# Patient Record
Sex: Female | Born: 1935 | Race: White | Hispanic: No | Smoking: Never smoker
Health system: Southern US, Community
[De-identification: ages and names within clinical notes are randomized; demographics above are authoritative.]

## PROBLEM LIST (undated history)

## (undated) DIAGNOSIS — L8992 Pressure ulcer of unspecified site, stage 2: Secondary | ICD-10-CM

## (undated) DIAGNOSIS — F0390 Unspecified dementia without behavioral disturbance: Secondary | ICD-10-CM

## (undated) DIAGNOSIS — F419 Anxiety disorder, unspecified: Secondary | ICD-10-CM

## (undated) DIAGNOSIS — D649 Anemia, unspecified: Secondary | ICD-10-CM

## (undated) DIAGNOSIS — E785 Hyperlipidemia, unspecified: Secondary | ICD-10-CM

## (undated) DIAGNOSIS — I10 Essential (primary) hypertension: Secondary | ICD-10-CM

## (undated) DIAGNOSIS — I509 Heart failure, unspecified: Secondary | ICD-10-CM

## (undated) HISTORY — DX: Anxiety disorder, unspecified: F41.9

## (undated) HISTORY — DX: Hyperlipidemia, unspecified: E78.5

## (undated) HISTORY — PX: BILATERAL OOPHORECTOMY: SHX1221

## (undated) HISTORY — PX: ABDOMINAL HYSTERECTOMY: SHX81

## (undated) HISTORY — PX: CHOLECYSTECTOMY: SHX55

---

## 1998-08-31 ENCOUNTER — Encounter: Payer: Self-pay | Admitting: Internal Medicine

## 1998-08-31 ENCOUNTER — Ambulatory Visit (HOSPITAL_COMMUNITY): Admission: RE | Admit: 1998-08-31 | Discharge: 1998-08-31 | Payer: Self-pay | Admitting: Internal Medicine

## 1999-07-21 ENCOUNTER — Ambulatory Visit (HOSPITAL_COMMUNITY): Admission: RE | Admit: 1999-07-21 | Discharge: 1999-07-21 | Payer: Self-pay | Admitting: Gastroenterology

## 1999-07-21 ENCOUNTER — Encounter (INDEPENDENT_AMBULATORY_CARE_PROVIDER_SITE_OTHER): Payer: Self-pay | Admitting: *Deleted

## 1999-07-22 ENCOUNTER — Encounter (INDEPENDENT_AMBULATORY_CARE_PROVIDER_SITE_OTHER): Payer: Self-pay | Admitting: *Deleted

## 1999-07-23 ENCOUNTER — Observation Stay (HOSPITAL_COMMUNITY): Admission: EM | Admit: 1999-07-23 | Discharge: 1999-07-24 | Payer: Self-pay | Admitting: Emergency Medicine

## 1999-07-23 ENCOUNTER — Encounter: Payer: Self-pay | Admitting: Emergency Medicine

## 1999-07-25 ENCOUNTER — Encounter: Payer: Self-pay | Admitting: Surgery

## 1999-07-25 ENCOUNTER — Inpatient Hospital Stay (HOSPITAL_COMMUNITY): Admission: EM | Admit: 1999-07-25 | Discharge: 1999-07-26 | Payer: Self-pay | Admitting: Emergency Medicine

## 1999-09-02 ENCOUNTER — Encounter: Payer: Self-pay | Admitting: Internal Medicine

## 1999-09-02 ENCOUNTER — Ambulatory Visit (HOSPITAL_COMMUNITY): Admission: RE | Admit: 1999-09-02 | Discharge: 1999-09-02 | Payer: Self-pay | Admitting: Internal Medicine

## 2000-01-03 ENCOUNTER — Other Ambulatory Visit: Admission: RE | Admit: 2000-01-03 | Discharge: 2000-01-03 | Payer: Self-pay | Admitting: *Deleted

## 2000-01-12 ENCOUNTER — Ambulatory Visit (HOSPITAL_COMMUNITY): Admission: RE | Admit: 2000-01-12 | Discharge: 2000-01-12 | Payer: Self-pay | Admitting: Gastroenterology

## 2000-02-03 ENCOUNTER — Encounter: Admission: RE | Admit: 2000-02-03 | Discharge: 2000-02-03 | Payer: Self-pay | Admitting: Internal Medicine

## 2000-02-03 ENCOUNTER — Encounter: Payer: Self-pay | Admitting: Internal Medicine

## 2000-08-23 ENCOUNTER — Encounter: Payer: Self-pay | Admitting: Urology

## 2000-08-23 ENCOUNTER — Encounter: Admission: RE | Admit: 2000-08-23 | Discharge: 2000-08-23 | Payer: Self-pay | Admitting: Urology

## 2000-08-24 ENCOUNTER — Other Ambulatory Visit: Admission: RE | Admit: 2000-08-24 | Discharge: 2000-08-24 | Payer: Self-pay | Admitting: Urology

## 2000-09-14 ENCOUNTER — Encounter: Admission: RE | Admit: 2000-09-14 | Discharge: 2000-09-14 | Payer: Self-pay | Admitting: Internal Medicine

## 2000-09-14 ENCOUNTER — Encounter: Payer: Self-pay | Admitting: Internal Medicine

## 2000-09-14 ENCOUNTER — Ambulatory Visit (HOSPITAL_COMMUNITY): Admission: RE | Admit: 2000-09-14 | Discharge: 2000-09-14 | Payer: Self-pay | Admitting: Internal Medicine

## 2001-07-10 ENCOUNTER — Encounter: Payer: Self-pay | Admitting: Family Medicine

## 2001-07-10 ENCOUNTER — Encounter: Admission: RE | Admit: 2001-07-10 | Discharge: 2001-07-10 | Payer: Self-pay | Admitting: Family Medicine

## 2001-08-02 ENCOUNTER — Encounter: Admission: RE | Admit: 2001-08-02 | Discharge: 2001-08-02 | Payer: Self-pay | Admitting: Family Medicine

## 2001-08-02 ENCOUNTER — Ambulatory Visit (HOSPITAL_COMMUNITY): Admission: RE | Admit: 2001-08-02 | Discharge: 2001-08-02 | Payer: Self-pay | Admitting: Family Medicine

## 2001-08-02 ENCOUNTER — Encounter: Payer: Self-pay | Admitting: Family Medicine

## 2001-09-28 ENCOUNTER — Encounter: Payer: Self-pay | Admitting: Family Medicine

## 2001-09-28 ENCOUNTER — Ambulatory Visit (HOSPITAL_COMMUNITY): Admission: RE | Admit: 2001-09-28 | Discharge: 2001-09-28 | Payer: Self-pay | Admitting: Family Medicine

## 2002-09-30 ENCOUNTER — Ambulatory Visit (HOSPITAL_COMMUNITY): Admission: RE | Admit: 2002-09-30 | Discharge: 2002-09-30 | Payer: Self-pay | Admitting: Family Medicine

## 2002-09-30 ENCOUNTER — Encounter: Payer: Self-pay | Admitting: Family Medicine

## 2002-09-30 ENCOUNTER — Encounter: Admission: RE | Admit: 2002-09-30 | Discharge: 2002-09-30 | Payer: Self-pay | Admitting: Family Medicine

## 2003-03-03 ENCOUNTER — Encounter: Admission: RE | Admit: 2003-03-03 | Discharge: 2003-03-03 | Payer: Self-pay | Admitting: Family Medicine

## 2003-03-23 ENCOUNTER — Encounter: Admission: RE | Admit: 2003-03-23 | Discharge: 2003-03-23 | Payer: Self-pay | Admitting: Family Medicine

## 2003-10-01 ENCOUNTER — Ambulatory Visit (HOSPITAL_COMMUNITY): Admission: RE | Admit: 2003-10-01 | Discharge: 2003-10-01 | Payer: Self-pay | Admitting: Family Medicine

## 2004-10-05 ENCOUNTER — Ambulatory Visit (HOSPITAL_COMMUNITY): Admission: RE | Admit: 2004-10-05 | Discharge: 2004-10-05 | Payer: Self-pay | Admitting: Family Medicine

## 2005-01-12 ENCOUNTER — Ambulatory Visit (HOSPITAL_COMMUNITY): Admission: RE | Admit: 2005-01-12 | Discharge: 2005-01-12 | Payer: Self-pay | Admitting: Gastroenterology

## 2005-01-12 ENCOUNTER — Encounter (INDEPENDENT_AMBULATORY_CARE_PROVIDER_SITE_OTHER): Payer: Self-pay | Admitting: *Deleted

## 2005-10-10 ENCOUNTER — Ambulatory Visit (HOSPITAL_COMMUNITY): Admission: RE | Admit: 2005-10-10 | Discharge: 2005-10-10 | Payer: Self-pay | Admitting: Family Medicine

## 2006-05-22 ENCOUNTER — Encounter: Admission: RE | Admit: 2006-05-22 | Discharge: 2006-05-22 | Payer: Self-pay | Admitting: Gastroenterology

## 2006-06-07 ENCOUNTER — Encounter: Admission: RE | Admit: 2006-06-07 | Discharge: 2006-06-07 | Payer: Self-pay | Admitting: Family Medicine

## 2006-10-13 ENCOUNTER — Ambulatory Visit (HOSPITAL_COMMUNITY): Admission: RE | Admit: 2006-10-13 | Discharge: 2006-10-13 | Payer: Self-pay | Admitting: Family Medicine

## 2007-02-02 ENCOUNTER — Ambulatory Visit (HOSPITAL_COMMUNITY): Admission: RE | Admit: 2007-02-02 | Discharge: 2007-02-02 | Payer: Self-pay | Admitting: Family Medicine

## 2007-02-02 ENCOUNTER — Ambulatory Visit: Payer: Self-pay | Admitting: Vascular Surgery

## 2007-02-02 ENCOUNTER — Encounter (INDEPENDENT_AMBULATORY_CARE_PROVIDER_SITE_OTHER): Payer: Self-pay | Admitting: Family Medicine

## 2007-06-09 ENCOUNTER — Encounter: Admission: RE | Admit: 2007-06-09 | Discharge: 2007-06-09 | Payer: Self-pay | Admitting: Family Medicine

## 2007-10-30 ENCOUNTER — Encounter: Admission: RE | Admit: 2007-10-30 | Discharge: 2007-10-30 | Payer: Self-pay | Admitting: Family Medicine

## 2007-10-30 ENCOUNTER — Ambulatory Visit (HOSPITAL_COMMUNITY): Admission: RE | Admit: 2007-10-30 | Discharge: 2007-10-30 | Payer: Self-pay | Admitting: Family Medicine

## 2008-03-31 ENCOUNTER — Ambulatory Visit: Payer: Self-pay | Admitting: Sports Medicine

## 2008-11-07 ENCOUNTER — Encounter: Admission: RE | Admit: 2008-11-07 | Discharge: 2008-11-07 | Payer: Self-pay | Admitting: Internal Medicine

## 2008-11-27 ENCOUNTER — Ambulatory Visit (HOSPITAL_COMMUNITY): Admission: RE | Admit: 2008-11-27 | Discharge: 2008-11-27 | Payer: Self-pay | Admitting: Internal Medicine

## 2008-12-23 ENCOUNTER — Encounter: Admission: RE | Admit: 2008-12-23 | Discharge: 2008-12-23 | Payer: Self-pay | Admitting: Internal Medicine

## 2009-07-10 ENCOUNTER — Encounter: Admission: RE | Admit: 2009-07-10 | Discharge: 2009-07-10 | Payer: Self-pay | Admitting: Internal Medicine

## 2009-07-29 ENCOUNTER — Encounter: Admission: RE | Admit: 2009-07-29 | Discharge: 2009-07-29 | Payer: Self-pay | Admitting: Internal Medicine

## 2009-08-07 ENCOUNTER — Encounter: Admission: RE | Admit: 2009-08-07 | Discharge: 2009-08-07 | Payer: Self-pay | Admitting: Internal Medicine

## 2009-10-29 ENCOUNTER — Encounter: Admission: RE | Admit: 2009-10-29 | Discharge: 2009-10-29 | Payer: Self-pay | Admitting: Orthopedic Surgery

## 2009-11-16 HISTORY — PX: CARDIOVASCULAR STRESS TEST: SHX262

## 2009-12-04 ENCOUNTER — Inpatient Hospital Stay (HOSPITAL_COMMUNITY): Admission: RE | Admit: 2009-12-04 | Discharge: 2009-12-07 | Payer: Self-pay | Admitting: Orthopedic Surgery

## 2010-02-02 ENCOUNTER — Ambulatory Visit (HOSPITAL_COMMUNITY): Admission: RE | Admit: 2010-02-02 | Discharge: 2010-02-02 | Payer: Self-pay | Admitting: Internal Medicine

## 2010-05-09 ENCOUNTER — Encounter: Payer: Self-pay | Admitting: Family Medicine

## 2010-05-09 ENCOUNTER — Encounter: Payer: Self-pay | Admitting: Internal Medicine

## 2010-07-02 LAB — DIFFERENTIAL
Eosinophils Absolute: 0.2 10*3/uL (ref 0.0–0.7)
Eosinophils Relative: 3 % (ref 0–5)
Lymphocytes Relative: 25 % (ref 12–46)
Lymphs Abs: 1.3 10*3/uL (ref 0.7–4.0)
Monocytes Absolute: 0.6 10*3/uL (ref 0.1–1.0)
Monocytes Relative: 11 % (ref 3–12)

## 2010-07-02 LAB — URINALYSIS, ROUTINE W REFLEX MICROSCOPIC
Glucose, UA: NEGATIVE mg/dL
Specific Gravity, Urine: 1.01 (ref 1.005–1.030)
pH: 7 (ref 5.0–8.0)

## 2010-07-02 LAB — COMPREHENSIVE METABOLIC PANEL
ALT: 20 U/L (ref 0–35)
AST: 23 U/L (ref 0–37)
Albumin: 4 g/dL (ref 3.5–5.2)
CO2: 29 mEq/L (ref 19–32)
Calcium: 9.2 mg/dL (ref 8.4–10.5)
Creatinine, Ser: 0.87 mg/dL (ref 0.4–1.2)
GFR calc Af Amer: >60 mL/min (ref >60)
GFR calc non Af Amer: >60 mL/min (ref >60)
Sodium: 140 mEq/L (ref 135–145)

## 2010-07-02 LAB — PROTIME-INR: Prothrombin Time: 13.1 seconds (ref 11.6–15.2)

## 2010-07-02 LAB — CBC
Hemoglobin: 12.4 g/dL (ref 12.0–15.0)
MCHC: 33.9 g/dL (ref 30.0–36.0)
Platelets: 196 10*3/uL (ref 150–400)
RBC: 3.94 MIL/uL (ref 3.87–5.11)

## 2010-07-02 LAB — HEMOGLOBIN AND HEMATOCRIT, BLOOD
HCT: 31 % — ABNORMAL LOW (ref 36.0–46.0)
HCT: 35.3 % — ABNORMAL LOW (ref 36.0–46.0)
Hemoglobin: 10.6 g/dL — ABNORMAL LOW (ref 12.0–15.0)

## 2010-07-02 LAB — TYPE AND SCREEN

## 2010-07-02 LAB — SURGICAL PCR SCREEN: MRSA, PCR: NEGATIVE

## 2010-09-03 NOTE — Procedures (Signed)
[place]. [facility]  Patient:    Kristin Wilkerson, Kristin Wilkerson                         MRN: [id number] Proc. Date: 07/21/99 Adm. Date:  [id number] Attending:  Charna Elizabeth CC:         Merlene Laughter. Renae Gloss, M.D.                           Procedure Report  DATE OF BIRTH:  [birth year]  PROCEDURE:  Esophagogastroduodenoscopy with biopsies.  REFERRING PHYSICIAN:  Merlene Laughter. Renae Gloss, M.D.  ENDOSCOPIST:  Anselmo Rod, M.D.  INSTRUMENT USED:  Olympus video panendoscope.  INDICATIONS FOR PROCEDURE:  A 77 year old white female with severe epigastric pain. The patient has a history of gallstones, but has had no symptoms in the recent past.  PREPROCEDURE PREPARATION:  Informed consent was procured from the patient.  The  patient was fasted for eight hours prior to the procedure.  PREPROCEDURE PHYSICAL EXAMINATION:  VITAL SIGNS:  Stable.  NECK:  Supple.  CHEST:  Clear to auscultation.  HEART:  S1 and S2 regular.  ABDOMEN:  Soft with normal abdominal bowel sounds.  DESCRIPTION OF PROCEDURE:  The patient was placed in the left lateral decubitus  position and sedated with 40 mg of Demerol and 4 mg of Versed intravenously. Once the patient was adequately sedated, maintained on low flow oxygen and continuous cardiac monitoring, the Olympus video panendoscope was advanced through the mouth piece, over the tongue and into the esophagus under direct vision. DD:  07/21/99 TD:  07/21/99 Job: [id number]

## 2010-09-03 NOTE — Procedures (Signed)
[place]. [facility]  Patient:    Kristin Wilkerson, Kristin Wilkerson                         MRN: [id number] Proc. Date: 01/12/00 Adm. Date:  [id number] Attending:  Charna Elizabeth CC:         Merlene Laughter. Renae Gloss, M.D.   Procedure Report  DATE OF BIRTH:  08/27/1925.  REFERRING PHYSICIAN:  Merlene Laughter. Renae Gloss, M.D.  PROCEDURE PERFORMED:  Colonoscopy.  ENDOSCOPIST:  Anselmo Rod, M.D.  INSTRUMENT USED:  Olympus video colonoscope.  INDICATIONS FOR PROCEDURE:  Guaiac positive stools during a recent physical exam.  Family history of colon cancer and rectal bleeding this morning in a 77 year old white female, rule out colonic polyps, masses, hemorrhoids, etc.  PREPROCEDURE PREPARATION:  Informed consent was procured from the patient. The patient was fasted for eight hours prior to the procedure and prepped with a bottle of magnesium citrate and a gallon of NuLytely the night prior to the procedure.  PREPROCEDURE PHYSICAL:  The patient had stable vital signs.  Neck supple. Chest clear to auscultation.  S1, S2 regular.  Abdomen soft with normal abdominal bowel sounds.  DESCRIPTION OF PROCEDURE:  The patient was placed in the left lateral decubitus position and sedated with 50 mg of Demerol and 5 mg of Versed intravenously.  Once the patient was adequately sedated and maintained on low-flow oxygen and continuous cardiac monitoring, the Olympus video colonoscope was advanced from the rectum to the cecum without difficulty. There was extensive left-sided diverticular disease with spasm in the left colon but no masses, polyps, erosions, or ulcerations were seen.  The procedure was complete to the cecum.  The cecum, right colon, and transverse colon appeared healthy.  The patient tolerated the procedure well without complication.  IMPRESSION: 1. Left-sided diverticular disease with fresh heme around one of the    diverticular pockets indicating that this might have bled  recently. 2. Otherwise normal-appearing cecum, right colon and transverse colon.  RECOMMENDATIONS: 1. Avoid nonsteroidals. 2. Increase the fluid and fiber in her diet. 3. Repeat colorectal cancer screening in the next five years. 4. Outpatient follow-up p.r.n. DD:  01/12/00 TD:  01/12/00 Job: [id number]

## 2010-09-03 NOTE — Op Note (Signed)
[place]. [facility]  Patient:    Kristin Wilkerson, Kristin Wilkerson                         MRN: [id number] Proc. Date: 07/23/99 Adm. Date:  [id number] Attending:  Brandy Hale CC:         Merlene Laughter. Renae Gloss, M.D.             Anselmo Rod, M.D.                           Operative Report  PREOPERATIVE DIAGNOSIS:  Acute cholecystitis with cholelithiasis.  POSTOPERATIVE DIAGNOSIS:  Acute cholecystitis with cholelithiasis.  OPERATION PERFORMED:  Laparoscopic cholecystectomy.  SURGEON:  Angelia Mould. Derrell Lolling, M.D.  FIRST ASSISTANT:  Sharlet Salina T. Hoxworth, M.D.  ANESTHESIA:  OPERATIVE INDICATIONS:  This is a 77 year old white female who has known gallstones for five years.  She has a one year history of mild intermittent episodes of epigastric and right upper quadrant pain.  She came to the emergency room last night with a more severe episode of right upper quadrant and epigastric pain after eating pork chops for supper.  The pain persisted and did not resolve.  She had a CT scan last night which showed a large gallstone in the neck of the gallbladder and thickened gallbladder wall, otherwise normal.  She had normal liver function tests.  White blood cell count was elevated at 11,800.  I was asked to see her n the emergency room after the above described workup, and after being in the emergency room for six or seven hours, she still had right upper quadrant tenderness.  She was admitted to the hospital and started on antibiotics, and she was brought to the operating room at this time for cholecystectomy.  OPERATIVE FINDINGS:  The gallbladder was acutely inflamed, distended, tense, and thick-walled, and some discolored.  There was a lot of edema of the wall of the  gallbladder.  The anatomy of the cystic duct, common bile duct, and cystic artery were conventional.  The liver appeared healthy.  The stomach and duodenum appeared normal.  Large intestine and  small intestine were grossly normal to inspection.  There was no ascites.  The peritoneal surfaces looked normal.  DESCRIPTION OF PROCEDURE:  Following the induction of general endotracheal anesthesia, the patients abdomen was prepped and draped in a sterile fashion. 0.25% Marcaine with epinephrine was used a local infiltration anesthetic.  A vertical incision was made in the upper rim of the umbilicus.  The fascia was incised in the midline and the abdominal cavity entered under direct vision.  A  10 mm Hussan trocar was inserted and secured with a pursestring suture of 0 Vicryl. Pneumoperitoneum was created.  Video camera was inserted with visualization and the findings as described above.  A 10 mm trocar was placed in the subxiphoid region and two 5 mm trocars placed in the right mid abdomen.  The gallbladder was identified and placed on traction.  Adhesions were taken down.  We aspirated some of the bile from the gallbladder so that we could grasp it more easily.  We continued dissection, taking down all the adhesions from the body of the gallbladder and the infundibulum of the gallbladder.  We were then able to completely elevate the gallbladder.  We dissected out the cystic duct and the cystic artery.  We secured the cystic artery with metal clips and divided  it. e dissected out the cystic duct for a good length until we clearly identified all the anatomy.  We then secured the cystic duct with several metal clips and divided t. The gallbladder was dissected from its bed with electrocautery and removed through the umbilical port.   The operative field was copiously irrigated with saline. There was no bleeding and no bile leak whatsoever at completion of the case. The irrigation fluid returned clear.  The trocars were removed under direct vision and there was no bleeding from the  trocar sites.  The pneumoperitoneum was released.  The fascia at the umbilicus as closed with  0 Vicryl sutures, and the skin incisions were closed with subcuticular sutures of 4-0 Vicryl and Steri-Strips.  Clean bandages were placed and the patient taken to the recovery room in stable  condition.  Estimated blood loss was about 20 cc.  Complications none.  Sponge,  needle, and instrument counts were correct. DD:  07/23/99 TD:  07/26/99 Job: [id number]

## 2010-09-03 NOTE — Op Note (Signed)
NAMECamellia, Popescu Wilkerson                  ACCOUNT NO.:  [id number]   MEDICAL RECORD NO.:  [id number]          PATIENT TYPE:  AMB   LOCATION:  ENDO                         FACILITY:  [facility]   PHYSICIAN:  Anselmo Rod, M.D.  DATE OF BIRTH:  21-Oct-1925   DATE OF PROCEDURE:  01/12/2005  DATE OF DISCHARGE:                                 OPERATIVE REPORT   PROCEDURE:  Colonoscopy with cold biopsies x3.   ENDOSCOPIST:  Charna Elizabeth, M.D.   INSTRUMENT USED:  Olympus video colonoscope.   INDICATIONS FOR PROCEDURE:  A 77 year old white female with a family history  of colon cancer in her father.  Undergoing a surveillance colonoscopy to  rule out colonic polyps, masses, etc.   PREPROCEDURE PREPARATION:  Informed consent was procured from the patient.  The patient was fasted for four hours prior to the procedure and prepped  with Osmo prep pills the night prior to the procedure and the morning of the  procedure.  Risks and benefits of the procedure including a 10% misread of  cancer and polyps were discussed as well.  She received 1 g of Ancef for  prophylaxis prior to the procedure.   PREPROCEDURE PHYSICAL:  VITAL SIGNS:  The patient had stable vital signs.  NECK:  Supple.  CHEST:  Clear to auscultation.  CARDIAC:  S1 and S2 regular.  ABDOMEN:  Soft with normal bowel sounds.   DESCRIPTION OF PROCEDURE:  The patient was placed in the left lateral  decubitus position and sedated with 60 mg of Demerol and 6 mg of Versed in  slow incremental doses.  Once the patient was adequately sedated, maintained  on low-flow oxygen and continuous cardiac monitoring, the Olympus video  colonoscope was advanced from the rectum to the cecum.  The appendiceal  orifice and ileocecal valve were visualized and photographed.  Small  internal hemorrhoids were seen on retroflexion.  There was evidence of  pandiverticulosis which was more prominent in the left colon.  Three small  sessile polyps were biopsied from  the rectum (cold biopsies x3).  The  patient tolerated the procedure well without complications.   IMPRESSION:  1.Pandiverticulosis with more prominent changes in the sigmoid  colon.  2.Three small sessile polyps removed by cold biopsies from the rectum.  3.Small internal and external hemorrhoids.   RECOMMENDATIONS:  1.Await pathology results.  2.Avoid nonsteroidals including aspirin for the next two weeks.  3.Repeat colonoscopy depending on pathology results.  4.High fiber diet with liberal fluid intake.  5.Literature on diverticulosis has been given to the patient for education.  6.Outpatient followup as need arises in the future.      Anselmo Rod, M.D.  Electronically Signed     JNM/MEDQ  D:  01/12/2005  T:  01/13/2005  Job:  [id number]   cc:   Talmadge Coventry, M.D.  Fax: [phone]

## 2010-09-03 NOTE — H&P (Signed)
[place]. [facility]  Patient:    Kristin Wilkerson, Kristin Wilkerson                         MRN: [id number] Adm. Date:  [id number] Attending:  Brandy Hale CC:         Merlene Laughter. Renae Gloss, M.D.             Anselmo Rod, M.D.                         History and Physical  CHIEF COMPLAINT: Abdominal pain.  HISTORY OF PRESENT ILLNESS: This is a 77 year old white female who noted the onset of epigastric and right upper quadrant pain after a pork chop supper yesterday,  July 22, 1999.  The patient has persisted since that time and has lasted now almost ten hours despite coming to the emergency room.  She denies nausea, vomiting, fever, or chills.  She denies diarrhea or other change in her bowel habits. She has had similar but milder intermittent episodes for one year.  She was told she had gallstones about five years ago when she lived in [place].  She underwent an upper endoscopy on July 21, 1999 and was found to have mild superficial antral erosions and a tiny hiatal hernia, and normal small bowel.  When she came to the emergency room tonight she was evaluated by the emergency department physician.  Gastroenterology was called and they recommended a CT scan.  A CT scan was obtained and shows a large gallstone in the neck of the gallbladder, some thickening of he gallbladder wall, but otherwise no abnormalities were noted.  There was no sign of any complication of her endoscopy.  I was called to see her at 4:30 a.m. this morning.  She is admitted for cholecystectomy.  PAST MEDICAL HISTORY:  1. Hypertension.  2. Mild gastroesophageal reflux disease.  3. Mild peptic ulcer disease as described above.  4. Glaucoma.  PAST SURGICAL HISTORY:  1. She had an endoscopy 48 hours ago.  2. She had total abdominal hysterectomy and left salpingo-oophorectomy 30 years     ago.  3. She had a right salpingo-oophorectomy three years ago.  4. She has had three pregnancies  and three deliveries.  CURRENT MEDICATIONS:  1. Prinivil 1 tablet q.d.  2. Hydrochlorothiazide 1 tablet q.d.  3. Zocor 1 tablet q.d.  4. Evista 1 tablet q.d.  5. Aspirin 1/2 tablet q.d.  6. Prilosec 20 mg q.d.  7. Alphagan 1 drop left eye b.i.d.  8. Timoptic 1 drop left eye q.d.  9. Xalatan 1 drop both eyes qd  ALLERGIES: No known drug allergies.  SOCIAL HISTORY: The patient and her husband live in [place].  They have three grown children.  She is a housewife.  Her husband is a retired Chartered loss adjuster from [employer].  She denies the use of alcohol or tobacco.  FAMILY HISTORY: Mother died at age 47 and had eye problems, coronary artery disease, and a stroke.  Father died at age 60 and had diabetes, colon cancer, and a stroke.  One brother is living and has eye problems.  One sister is living and as elevated blood lipids.  REVIEW OF SYSTEMS: All systems are reviewed and are negative except as described above.  PHYSICAL EXAMINATION:  GENERAL: Pleasant,  healthy-appearing older woman, in no distress.  VITAL SIGNS: Blood pressure 118/72, heart rate 88, temperature 99.0 degrees, respirations  22 and unlabored.  Oxygen saturation 100% on room air.  HEENT:  PERRLA.  EOMI.  Sclerae clear.  Nose, oropharynx, and external auditory  canals are without gross lesions.  NECK: Supple, nontender.  No mass.  Carotid pulses 2+ with no bruit.  No adenopathy, no thyromegaly.  LUNGS: Clear to auscultation.  No CVA tenderness.  HEART: Regular rate and rhythm with no murmur.  ABDOMEN: Soft, but she is tender with involuntary guarding in the epigastrium and right upper quadrant.  No mass or hernia.  There is a well-healed lower midline  scar.  GU: Genitalia reveals no inguinal adenopathy or hernia.  No abnormalities noted  externally.  EXTREMITIES: Good range of motion with no deformity, no edema, good pulses.  NEUROLOGIC: Grossly within normal limits.  ADMISSION LABORATORY  DATA: CT scan (as described above) shows thickened gallbladder wall and gallstones.  Hemoglobin 13.3, WBC 11,800.  Liver function tests normal.  Serum amylase and serum lipase normal.  Urinalysis pending.  Chest x-ray clear.  EKG shows normal sinus rhythm with a few PVCs.  IMPRESSION:  1. Acute cholecystitis with cholelithiasis.  2. Hypertension.  3. Glaucoma.  4. Mild peptic ulcer disease.  PLAN:  1. The patient will be admitted with the intent that she will be taken to the     operating room sometime today for laparoscopic cholecystectomy.  2. In the interim she will receive intravenous fluids, intravenous antibiotics,     and analgesics.  3. The indications and details of the procedure, risks, and complications were     outlined in great detail.  At this time all of her questions are answered.  She agrees with this plan.DD:  07/23/99 TD:  07/23/99 Job: [id number]

## 2011-01-21 ENCOUNTER — Other Ambulatory Visit: Payer: Self-pay | Admitting: Internal Medicine

## 2011-01-21 DIAGNOSIS — Z1231 Encounter for screening mammogram for malignant neoplasm of breast: Secondary | ICD-10-CM

## 2011-01-31 LAB — HM COLONOSCOPY

## 2011-02-04 ENCOUNTER — Ambulatory Visit (HOSPITAL_COMMUNITY)
Admission: RE | Admit: 2011-02-04 | Discharge: 2011-02-04 | Disposition: A | Discharge status: Home / Self Care | Payer: Medicare Other | Source: Ambulatory Visit | Attending: Internal Medicine | Admitting: Internal Medicine

## 2011-02-04 DIAGNOSIS — Z1231 Encounter for screening mammogram for malignant neoplasm of breast: Secondary | ICD-10-CM | POA: Insufficient documentation

## 2011-02-09 ENCOUNTER — Other Ambulatory Visit: Payer: Self-pay | Admitting: Internal Medicine

## 2011-02-09 DIAGNOSIS — R928 Other abnormal and inconclusive findings on diagnostic imaging of breast: Secondary | ICD-10-CM

## 2011-02-18 ENCOUNTER — Ambulatory Visit
Admission: RE | Admit: 2011-02-18 | Discharge: 2011-02-18 | Disposition: A | Discharge status: Home / Self Care | Payer: No Typology Code available for payment source | Source: Ambulatory Visit | Attending: Internal Medicine | Admitting: Internal Medicine

## 2011-02-18 ENCOUNTER — Other Ambulatory Visit: Payer: No Typology Code available for payment source

## 2011-02-18 DIAGNOSIS — R928 Other abnormal and inconclusive findings on diagnostic imaging of breast: Secondary | ICD-10-CM

## 2011-02-23 ENCOUNTER — Other Ambulatory Visit: Payer: No Typology Code available for payment source

## 2011-04-26 DIAGNOSIS — M48061 Spinal stenosis, lumbar region without neurogenic claudication: Secondary | ICD-10-CM | POA: Diagnosis not present

## 2011-04-26 DIAGNOSIS — M1710 Unilateral primary osteoarthritis, unspecified knee: Secondary | ICD-10-CM | POA: Diagnosis not present

## 2011-05-16 DIAGNOSIS — H353190 Nonexudative age-related macular degeneration, unspecified eye, stage unspecified: Secondary | ICD-10-CM | POA: Diagnosis not present

## 2011-05-16 DIAGNOSIS — H4011X0 Primary open-angle glaucoma, stage unspecified: Secondary | ICD-10-CM | POA: Diagnosis not present

## 2011-05-18 DIAGNOSIS — I10 Essential (primary) hypertension: Secondary | ICD-10-CM | POA: Diagnosis not present

## 2011-05-18 DIAGNOSIS — G609 Hereditary and idiopathic neuropathy, unspecified: Secondary | ICD-10-CM | POA: Diagnosis not present

## 2011-05-18 DIAGNOSIS — B9789 Other viral agents as the cause of diseases classified elsewhere: Secondary | ICD-10-CM | POA: Diagnosis not present

## 2011-05-18 DIAGNOSIS — Z23 Encounter for immunization: Secondary | ICD-10-CM | POA: Diagnosis not present

## 2011-06-13 DIAGNOSIS — J040 Acute laryngitis: Secondary | ICD-10-CM | POA: Diagnosis not present

## 2011-06-13 DIAGNOSIS — R05 Cough: Secondary | ICD-10-CM | POA: Diagnosis not present

## 2011-07-05 DIAGNOSIS — R490 Dysphonia: Secondary | ICD-10-CM | POA: Diagnosis not present

## 2011-07-05 DIAGNOSIS — K219 Gastro-esophageal reflux disease without esophagitis: Secondary | ICD-10-CM | POA: Diagnosis not present

## 2011-07-26 DIAGNOSIS — M1710 Unilateral primary osteoarthritis, unspecified knee: Secondary | ICD-10-CM | POA: Diagnosis not present

## 2011-08-02 DIAGNOSIS — J209 Acute bronchitis, unspecified: Secondary | ICD-10-CM | POA: Diagnosis not present

## 2011-08-09 DIAGNOSIS — E782 Mixed hyperlipidemia: Secondary | ICD-10-CM | POA: Diagnosis not present

## 2011-08-09 DIAGNOSIS — R000 Tachycardia, unspecified: Secondary | ICD-10-CM | POA: Diagnosis not present

## 2011-08-09 DIAGNOSIS — I10 Essential (primary) hypertension: Secondary | ICD-10-CM | POA: Diagnosis not present

## 2011-09-01 DIAGNOSIS — R5383 Other fatigue: Secondary | ICD-10-CM | POA: Diagnosis not present

## 2011-09-01 DIAGNOSIS — R002 Palpitations: Secondary | ICD-10-CM | POA: Diagnosis not present

## 2011-09-01 DIAGNOSIS — Z79899 Other long term (current) drug therapy: Secondary | ICD-10-CM | POA: Diagnosis not present

## 2011-09-01 HISTORY — PX: TRANSTHORACIC ECHOCARDIOGRAM: SHX275

## 2011-09-16 DIAGNOSIS — R002 Palpitations: Secondary | ICD-10-CM | POA: Diagnosis not present

## 2011-09-16 DIAGNOSIS — I10 Essential (primary) hypertension: Secondary | ICD-10-CM | POA: Diagnosis not present

## 2011-09-16 DIAGNOSIS — E782 Mixed hyperlipidemia: Secondary | ICD-10-CM | POA: Diagnosis not present

## 2011-09-24 DIAGNOSIS — R5381 Other malaise: Secondary | ICD-10-CM | POA: Diagnosis not present

## 2011-09-24 DIAGNOSIS — R5383 Other fatigue: Secondary | ICD-10-CM | POA: Diagnosis not present

## 2011-09-24 DIAGNOSIS — R42 Dizziness and giddiness: Secondary | ICD-10-CM | POA: Diagnosis not present

## 2011-09-24 DIAGNOSIS — R51 Headache: Secondary | ICD-10-CM | POA: Diagnosis not present

## 2011-09-26 DIAGNOSIS — I10 Essential (primary) hypertension: Secondary | ICD-10-CM | POA: Diagnosis not present

## 2011-09-26 DIAGNOSIS — E785 Hyperlipidemia, unspecified: Secondary | ICD-10-CM | POA: Diagnosis not present

## 2011-09-28 DIAGNOSIS — M810 Age-related osteoporosis without current pathological fracture: Secondary | ICD-10-CM | POA: Diagnosis not present

## 2011-09-28 DIAGNOSIS — I10 Essential (primary) hypertension: Secondary | ICD-10-CM | POA: Diagnosis not present

## 2011-09-28 DIAGNOSIS — F411 Generalized anxiety disorder: Secondary | ICD-10-CM | POA: Diagnosis not present

## 2011-11-10 DIAGNOSIS — H43819 Vitreous degeneration, unspecified eye: Secondary | ICD-10-CM | POA: Diagnosis not present

## 2011-11-10 DIAGNOSIS — H4010X0 Unspecified open-angle glaucoma, stage unspecified: Secondary | ICD-10-CM | POA: Diagnosis not present

## 2011-11-10 DIAGNOSIS — H353190 Nonexudative age-related macular degeneration, unspecified eye, stage unspecified: Secondary | ICD-10-CM | POA: Diagnosis not present

## 2011-12-06 DIAGNOSIS — H6500 Acute serous otitis media, unspecified ear: Secondary | ICD-10-CM | POA: Diagnosis not present

## 2011-12-06 DIAGNOSIS — J019 Acute sinusitis, unspecified: Secondary | ICD-10-CM | POA: Diagnosis not present

## 2011-12-16 DIAGNOSIS — M19049 Primary osteoarthritis, unspecified hand: Secondary | ICD-10-CM | POA: Diagnosis not present

## 2011-12-22 DIAGNOSIS — M65849 Other synovitis and tenosynovitis, unspecified hand: Secondary | ICD-10-CM | POA: Diagnosis not present

## 2011-12-22 DIAGNOSIS — M65839 Other synovitis and tenosynovitis, unspecified forearm: Secondary | ICD-10-CM | POA: Diagnosis not present

## 2012-01-11 DIAGNOSIS — F411 Generalized anxiety disorder: Secondary | ICD-10-CM | POA: Diagnosis not present

## 2012-01-11 DIAGNOSIS — G4700 Insomnia, unspecified: Secondary | ICD-10-CM | POA: Diagnosis not present

## 2012-01-11 DIAGNOSIS — R000 Tachycardia, unspecified: Secondary | ICD-10-CM | POA: Diagnosis not present

## 2012-01-24 DIAGNOSIS — H5210 Myopia, unspecified eye: Secondary | ICD-10-CM | POA: Diagnosis not present

## 2012-01-24 DIAGNOSIS — H4011X0 Primary open-angle glaucoma, stage unspecified: Secondary | ICD-10-CM | POA: Diagnosis not present

## 2012-01-24 DIAGNOSIS — H409 Unspecified glaucoma: Secondary | ICD-10-CM | POA: Diagnosis not present

## 2012-01-24 DIAGNOSIS — Z961 Presence of intraocular lens: Secondary | ICD-10-CM | POA: Diagnosis not present

## 2012-01-26 DIAGNOSIS — M1710 Unilateral primary osteoarthritis, unspecified knee: Secondary | ICD-10-CM | POA: Diagnosis not present

## 2012-02-08 ENCOUNTER — Other Ambulatory Visit: Payer: Self-pay | Admitting: Internal Medicine

## 2012-02-08 DIAGNOSIS — Z1231 Encounter for screening mammogram for malignant neoplasm of breast: Secondary | ICD-10-CM

## 2012-02-24 ENCOUNTER — Ambulatory Visit (HOSPITAL_COMMUNITY)
Admission: RE | Admit: 2012-02-24 | Discharge: 2012-02-24 | Disposition: A | Discharge status: Home / Self Care | Payer: Medicare Other | Source: Ambulatory Visit | Attending: Internal Medicine | Admitting: Internal Medicine

## 2012-02-24 DIAGNOSIS — Z1231 Encounter for screening mammogram for malignant neoplasm of breast: Secondary | ICD-10-CM | POA: Insufficient documentation

## 2012-03-26 DIAGNOSIS — Z23 Encounter for immunization: Secondary | ICD-10-CM | POA: Diagnosis not present

## 2012-03-29 DIAGNOSIS — E782 Mixed hyperlipidemia: Secondary | ICD-10-CM | POA: Diagnosis not present

## 2012-03-29 DIAGNOSIS — I10 Essential (primary) hypertension: Secondary | ICD-10-CM | POA: Diagnosis not present

## 2012-03-29 DIAGNOSIS — M1710 Unilateral primary osteoarthritis, unspecified knee: Secondary | ICD-10-CM | POA: Diagnosis not present

## 2012-03-31 ENCOUNTER — Emergency Department (HOSPITAL_COMMUNITY): Payer: Medicare Other

## 2012-03-31 ENCOUNTER — Encounter (HOSPITAL_COMMUNITY): Payer: Self-pay | Admitting: Physical Medicine and Rehabilitation

## 2012-03-31 ENCOUNTER — Emergency Department (HOSPITAL_COMMUNITY)
Admission: EM | Admit: 2012-03-31 | Discharge: 2012-03-31 | Disposition: A | Discharge status: Home / Self Care | Payer: Medicare Other | Attending: Emergency Medicine | Admitting: Emergency Medicine

## 2012-03-31 DIAGNOSIS — S022XXA Fracture of nasal bones, initial encounter for closed fracture: Secondary | ICD-10-CM | POA: Insufficient documentation

## 2012-03-31 DIAGNOSIS — I10 Essential (primary) hypertension: Secondary | ICD-10-CM | POA: Insufficient documentation

## 2012-03-31 DIAGNOSIS — Z7982 Long term (current) use of aspirin: Secondary | ICD-10-CM | POA: Insufficient documentation

## 2012-03-31 DIAGNOSIS — Y929 Unspecified place or not applicable: Secondary | ICD-10-CM | POA: Insufficient documentation

## 2012-03-31 DIAGNOSIS — IMO0002 Reserved for concepts with insufficient information to code with codable children: Secondary | ICD-10-CM | POA: Insufficient documentation

## 2012-03-31 DIAGNOSIS — S0990XA Unspecified injury of head, initial encounter: Secondary | ICD-10-CM | POA: Diagnosis not present

## 2012-03-31 DIAGNOSIS — S0120XA Unspecified open wound of nose, initial encounter: Secondary | ICD-10-CM | POA: Diagnosis not present

## 2012-03-31 DIAGNOSIS — W010XXA Fall on same level from slipping, tripping and stumbling without subsequent striking against object, initial encounter: Secondary | ICD-10-CM | POA: Insufficient documentation

## 2012-03-31 DIAGNOSIS — Y939 Activity, unspecified: Secondary | ICD-10-CM | POA: Insufficient documentation

## 2012-03-31 DIAGNOSIS — W19XXXA Unspecified fall, initial encounter: Secondary | ICD-10-CM

## 2012-03-31 DIAGNOSIS — Z23 Encounter for immunization: Secondary | ICD-10-CM | POA: Diagnosis not present

## 2012-03-31 DIAGNOSIS — Z79899 Other long term (current) drug therapy: Secondary | ICD-10-CM | POA: Insufficient documentation

## 2012-03-31 HISTORY — DX: Essential (primary) hypertension: I10

## 2012-03-31 MED ORDER — TETANUS-DIPHTH-ACELL PERTUSSIS 5-2.5-18.5 LF-MCG/0.5 IM SUSP
0.5000 mL | Freq: Once | INTRAMUSCULAR | Status: AC
  Administered 2012-03-31: 0.5 mL via INTRAMUSCULAR
  Filled 2012-03-31: qty 0.5

## 2012-03-31 NOTE — ED Notes (Signed)
Pt presents to department for evaluation of fall. States she tripped and fell off curb, landed on face and head. Now states headache and facial pain. Swelling noted to nose, multiple abrasions noted to nose and forehead. 7/10 pain at the time. Respirations unlabored. She is conscious alert and oriented x4. No other injuries noted.

## 2012-03-31 NOTE — ED Notes (Signed)
VISUAL ACUITY  OD 20/25  OS unreadable pt states that this is normal due to a "blood clot" in the past.  OU 20/25.   Dressing applied to nose.

## 2012-03-31 NOTE — ED Provider Notes (Signed)
Medical screening examination/treatment/procedure(s) were conducted as a shared visit with non-physician practitioner(s) and myself.  I personally evaluated the patient during the encounter Jones Skene, M.D.  Kristin Wilkerson is a 78 y.o. female presents with mechanical fall and nasal laceration.  Pain is mild to moderate constant and well localized to nose and midface.  No dizziness, no headache, chest pain, neuro deficits, shortness of breath, N/V/D or abdominal pain. Denies visual changes or diplopia.  VITAL SIGNS:   Filed Vitals:   03/31/12 1615  BP: 169/73  Pulse: 83  Temp:   Resp:    CONSTITUTIONAL: Awake, oriented, appears non-toxic HENT: Repaired laceration to nasal bridge with swelling and TTP.  Normocephalic, oral mucosa pink and moist, airway patent. Nares patent with clear nasal drainage, no septal hematoma. External ears normal. EYES: Conjunctiva clear, EOMI, PERRLA NECK: Trachea midline, non-tender, supple CARDIOVASCULAR: Normal heart rate, Normal rhythm, No murmurs, rubs, gallops PULMONARY/CHEST: Clear to auscultation, no rhonchi, wheezes, or rales. Symmetrical breath sounds. Non-tender. NEUROLOGIC: Non-focal, moving all four extremities, no gross sensory or motor deficits. EXTREMITIES: No clubbing, cyanosis, or edema SKIN: Warm, Dry, No erythema, No rash   Patient Vitals for the past 24 hrs:  BP Temp Temp src Pulse Resp SpO2  03/31/12 1615 169/73 mmHg - - 83  - 97 %  03/31/12 1530 165/66 mmHg - - 67  - 99 %  03/31/12 1521 159/77 mmHg 97.8 F (36.6 C) Oral 72  20  99 %  03/31/12 1330 163/67 mmHg - - 61  - 98 %  03/31/12 1215 169/80 mmHg 97.4 F (36.3 C) Oral 84  16  98 %    MDM:Kristin Wilkerson is a 78 y.o. female with mechanical fall and broken left nasal bone. No ABX by ENT consultant.  Pt to f/u as OP with personal ENT, do not think this was a syncopal episode.  No other complaints.  Pt stable and ok for DC.   Jones Skene, MD 03/31/12 1952

## 2012-03-31 NOTE — ED Provider Notes (Signed)
History     CSN: [id number]  Arrival date & time 03/31/12  1212   First MD Initiated Contact with Patient 03/31/12 1226      Chief Complaint  Patient presents with  . Fall  . Abrasion  . Headache    (Consider location/radiation/quality/duration/timing/severity/associated sxs/prior treatment) HPI Comments: Kristin Wilkerson is a 78 y.o. female with a history of hypertension presents emergency department after a mechanical fall.  Incident occurred just prior to arrival and was  described as a missed stepping causing patient to fall landing face first with the point of impact being her face on hard concrete.  Since the incident patient has had a consistent headache and pain in the nasal region.  Patient reports blood loss at incident, but bleeding has been controlled. She denies the use of blood thinners other then ASA, change in vision, neck pain, syncope, CP, hip pain, ataxia, inability to ambulate, nausea or vomiting.     The history is provided by the patient.    Past Medical History  Diagnosis Date  . Hypertension     No past surgical history on file.  No family history on file.  History  Substance Use Topics  . Smoking status: Never Smoker   . Smokeless tobacco: Not on file  . Alcohol Use: No    OB History    Grav Para Term Preterm Abortions TAB SAB Ect Mult Living                  Review of Systems  Constitutional: Negative for fever, diaphoresis and activity change.  HENT: Negative for congestion and neck pain.   Respiratory: Negative for cough.   Genitourinary: Negative for dysuria.  Musculoskeletal: Negative for myalgias.  Skin: Positive for wound. Negative for color change.  Neurological: Negative for headaches.  All other systems reviewed and are negative.    Allergies  Review of patient's allergies indicates no known allergies.  Home Medications   Current Outpatient Rx  Name  Route  Sig  Dispense  Refill  . ACETAMINOPHEN 500 MG PO TABS   Oral  Take 500 mg by mouth once. For pain         . AMLODIPINE BESYLATE 5 MG PO TABS   Oral   Take 2.5 mg by mouth daily.         . ASPIRIN EC 81 MG PO TBEC   Oral   Take 81 mg by mouth daily.         [name] CITALOPRAM HYDROBROMIDE 20 MG PO TABS   Oral   Take 20 mg by mouth daily.         [name] DICLOFENAC SODIUM 1 % TD GEL   Topical   Apply 2 g topically as needed. For pain         . METOPROLOL TARTRATE 50 MG PO TABS   Oral   Take 25 mg by mouth daily.         [name] OLMESARTAN MEDOXOMIL 40 MG PO TABS   Oral   Take 40 mg by mouth daily.         [name] SIMVASTATIN 20 MG PO TABS   Oral   Take 20 mg by mouth every evening.         [name] TRAMADOL-ACETAMINOPHEN 37.5-325 MG PO TABS   Oral   Take 1 tablet by mouth every 6 (six) hours as needed. For pain           BP 169/80  Pulse 84  Temp 97.4 F (36.3 C) (Oral)  Resp 16  SpO2 98%  Physical Exam  Nursing note and vitals reviewed. Constitutional: She is oriented to person, place, and time. She appears well-developed and well-nourished. No distress.  HENT:  Head: Normocephalic.       1.5 cm superficial laceration over nasal bridge w surrounding abrasions. Mild bleeding. No septal hematoma or epistaxis.   Eyes: Conjunctivae normal and EOM are normal.  Neck: Normal range of motion.  Pulmonary/Chest: Effort normal.  Musculoskeletal: Normal range of motion.  Neurological: She is alert and oriented to person, place, and time.       CN III-XII intact. Good finger nose & rapid alt movements. Normal Gait. Strength 5/5 bilaterally, sensation intact.   Skin: Skin is warm and dry. No rash noted. She is not diaphoretic.  Psychiatric: She has a normal mood and affect. Her behavior is normal.    ED Course  Procedures (including critical care time)  Labs Reviewed - No data to display Ct Head Wo Contrast  03/31/2012  *RADIOLOGY REPORT*  Clinical Data:  Fall, injury to face  CT HEAD WITHOUT CONTRAST CT MAXILLOFACIAL WITHOUT CONTRAST   Technique:  Multidetector CT imaging of the head and maxillofacial structures were performed using the standard protocol without intravenous contrast. Multiplanar CT image reconstructions of the maxillofacial structures were also generated.  Comparison:  The cervical spine MRI 06/09/2007  CT HEAD  Findings: No intracranial hemorrhage.  No parenchymal contusion.  No midline shift or mass effect.  Basilar cisterns are patent. No skull base fracture.  No fluid in the paranasal sinuses or mastoid air cells.  There is generalized cortical atrophy and periventricular white matter hypodensity.  The  IMPRESSION:  1.  No intracranial trauma. 2.  Atrophy and microvascular disease.  CT MAXILLOFACIAL  Findings:   There is soft tissue swelling over the bridge of the nose.  There is a nondisplaced fracture of the left nasal bone (image #54).  No evidence of orbital fracture.  The intraconal contents and globes are normal.  No zygomatic arch fracture.  The maxillary sinuses are normal without evidence of fluid.  Pterygoid plates are normal.  Mandibular condyles are located.  No evidence of mandibular fracture.  The deep soft tissues of the face are normal.  IMPRESSION:  1.  Nondisplaced fracture left nasal bone with mild superficial swelling.  2.  No evidence of orbital fracture.   Original Report Authenticated By: Genevive Bi, M.D.    Ct Maxillofacial Wo Cm  03/31/2012  *RADIOLOGY REPORT*  Clinical Data:  Fall, injury to face  CT HEAD WITHOUT CONTRAST CT MAXILLOFACIAL WITHOUT CONTRAST  Technique:  Multidetector CT imaging of the head and maxillofacial structures were performed using the standard protocol without intravenous contrast. Multiplanar CT image reconstructions of the maxillofacial structures were also generated.  Comparison:  The cervical spine MRI 06/09/2007  CT HEAD  Findings: No intracranial hemorrhage.  No parenchymal contusion.  No midline shift or mass effect.  Basilar cisterns are patent. No skull base  fracture.  No fluid in the paranasal sinuses or mastoid air cells.  There is generalized cortical atrophy and periventricular white matter hypodensity.  The  IMPRESSION:  1.  No intracranial trauma. 2.  Atrophy and microvascular disease.  CT MAXILLOFACIAL  Findings:   There is soft tissue swelling over the bridge of the nose.  There is a nondisplaced fracture of the left nasal bone (image #54).  No evidence of orbital fracture.  The intraconal contents and  globes are normal.  No zygomatic arch fracture.  The maxillary sinuses are normal without evidence of fluid.  Pterygoid plates are normal.  Mandibular condyles are located.  No evidence of mandibular fracture.  The deep soft tissues of the face are normal.  IMPRESSION:  1.  Nondisplaced fracture left nasal bone with mild superficial swelling.  2.  No evidence of orbital fracture.   Original Report Authenticated By: Genevive Bi, M.D.    LACERATION REPAIR Performed by: Jaci Carrel Authorized by: Jaci Carrel Consent: Verbal consent obtained. Risks and benefits: risks, benefits and alternatives were discussed Consent given by: patient Patient identity confirmed: provided demographic data Prepped and Draped in normal sterile fashion Wound explored- superficial no bony involvement  Laceration Location: nasal bridge  Laceration Length: 1.5cm  No Foreign Bodies seen or palpated  Anesthesia: local infiltration  Local anesthetic: lidocaine 2% wothout epinephrine  Anesthetic total: 2 ml  Irrigation method: syringe Amount of cleaning: standard  Skin closure: 6.0 Prolene  Number of sutures: 4  Technique: simple interupted  Patient tolerance: Patient tolerated the procedure well with no immediate complications.   No diagnosis found.  Consult: ENT, [facility] Will see as OP in 5 days, abx not indicated  MDM  Nasal bone fracture, superficial laceration  Pt to follow up as an OP w ENT on Thursday as per  above consult. No abx indicated per ENT. Small superficial laceration repaired, tdap given, and instructed to have sutures removed in 5 days. Conservative home therapy discussed as well as no nose blowing. No sign of septal hematoma. Pt seen and evaluated by my attending as well who is agreeable with plan.      Jaci Carrel, [place] 03/31/12 1559

## 2012-03-31 NOTE — ED Notes (Signed)
Patient transported to CT 

## 2012-04-05 DIAGNOSIS — S0180XA Unspecified open wound of other part of head, initial encounter: Secondary | ICD-10-CM | POA: Diagnosis not present

## 2012-04-16 ENCOUNTER — Emergency Department (HOSPITAL_COMMUNITY): Payer: Medicare Other

## 2012-04-16 ENCOUNTER — Emergency Department (HOSPITAL_COMMUNITY)
Admission: EM | Admit: 2012-04-16 | Discharge: 2012-04-16 | Disposition: A | Discharge status: Home / Self Care | Payer: Medicare Other | Attending: Emergency Medicine | Admitting: Emergency Medicine

## 2012-04-16 ENCOUNTER — Encounter (HOSPITAL_COMMUNITY): Payer: Self-pay | Admitting: Cardiology

## 2012-04-16 DIAGNOSIS — I10 Essential (primary) hypertension: Secondary | ICD-10-CM | POA: Diagnosis not present

## 2012-04-16 DIAGNOSIS — R51 Headache: Secondary | ICD-10-CM | POA: Diagnosis not present

## 2012-04-16 DIAGNOSIS — Z7982 Long term (current) use of aspirin: Secondary | ICD-10-CM | POA: Insufficient documentation

## 2012-04-16 DIAGNOSIS — S0990XA Unspecified injury of head, initial encounter: Secondary | ICD-10-CM | POA: Diagnosis not present

## 2012-04-16 DIAGNOSIS — Z79899 Other long term (current) drug therapy: Secondary | ICD-10-CM | POA: Diagnosis not present

## 2012-04-16 DIAGNOSIS — N390 Urinary tract infection, site not specified: Secondary | ICD-10-CM | POA: Diagnosis not present

## 2012-04-16 DIAGNOSIS — R42 Dizziness and giddiness: Secondary | ICD-10-CM | POA: Diagnosis not present

## 2012-04-16 MED ORDER — TRAMADOL HCL 50 MG PO TABS: 50.0000 mg | ORAL_TABLET | Freq: Four times a day (QID) | ORAL | Status: DC | PRN

## 2012-04-16 MED ORDER — TRAMADOL HCL 50 MG PO TABS
50.0000 mg | ORAL_TABLET | Freq: Once | ORAL | Status: AC
  Administered 2012-04-16: 50 mg via ORAL
  Filled 2012-04-16: qty 1

## 2012-04-16 NOTE — ED Notes (Signed)
Pt reports about 2 weeks ago she tripped and fell and fractured her nose. Now with a headache and increased BP. No neuro deficits. Pt with old bruising under her eyes.

## 2012-04-16 NOTE — ED Notes (Signed)
Patient transported to CT via wheelchair

## 2012-04-17 NOTE — ED Provider Notes (Signed)
History     CSN: [id number]  Arrival date & time 04/16/12  1734   First MD Initiated Contact with Patient 04/16/12 2214      Chief Complaint  Patient presents with  . Fall    (Consider location/radiation/quality/duration/timing/severity/associated sxs/prior treatment) HPI History provided by pt.   Pt referred from [facility] for further evaluation of headache.  Had gradual onset diffuse headache yesterday.  Pain mildly improved today and is alleviated by tylenol.  No associated fever, dizziness, vision changes, dysarthria, dysphagia, extremity weakness/paresthesias, confusion, ataxia.  Treated for minor head trauma in ED 2 weeks ago.  She is not anti-coagulated. Past Medical History  Diagnosis Date  . Hypertension     History reviewed. No pertinent past surgical history.  History reviewed. No pertinent family history.  History  Substance Use Topics  . Smoking status: Never Smoker   . Smokeless tobacco: Not on file  . Alcohol Use: No    OB History    Grav Para Term Preterm Abortions TAB SAB Ect Mult Living                  Review of Systems  All other systems reviewed and are negative.    Allergies  Review of patient's allergies indicates no known allergies.  Home Medications   Current Outpatient Rx  Name  Route  Sig  Dispense  Refill  . ACETAMINOPHEN 500 MG PO TABS   Oral   Take 500 mg by mouth once. For pain         . AMLODIPINE BESYLATE 5 MG PO TABS   Oral   Take 2.5 mg by mouth daily.         . ASPIRIN EC 81 MG PO TBEC   Oral   Take 81 mg by mouth daily.         [name] CITALOPRAM HYDROBROMIDE 20 MG PO TABS   Oral   Take 20 mg by mouth daily.         [name] DICLOFENAC SODIUM 1 % TD GEL   Topical   Apply 2 g topically 4 (four) times daily as needed. For pain         . METOPROLOL TARTRATE 50 MG PO TABS   Oral   Take 25 mg by mouth daily.         [name] OLMESARTAN MEDOXOMIL 40 MG PO TABS   Oral   Take 40 mg by mouth daily.         [name] SIMVASTATIN  20 MG PO TABS   Oral   Take 20 mg by mouth every evening.         [name] TRAMADOL-ACETAMINOPHEN 37.5-325 MG PO TABS   Oral   Take 1 tablet by mouth every 6 (six) hours as needed. For pain         . TRAMADOL HCL 50 MG PO TABS   Oral   Take 1 tablet (50 mg total) by mouth every 6 (six) hours as needed for pain.   12 tablet   0     BP 163/72  Pulse 67  Temp 98.4 F (36.9 C) (Oral)  Resp 18  SpO2 98%  Physical Exam  Nursing note and vitals reviewed. Constitutional: She is oriented to person, place, and time. She appears well-developed and well-nourished.  HENT:  Head: Normocephalic and atraumatic.       No tenderness of sinuses or temples.   Eyes:       Normal appearance  Neck: Normal range  of motion. Neck supple. No rigidity. No Brudzinski's sign and no Kernig's sign noted.  Cardiovascular: Normal rate, regular rhythm and intact distal pulses.   Pulmonary/Chest: Effort normal and breath sounds normal.  Musculoskeletal: Normal range of motion.  Neurological: She is alert and oriented to person, place, and time. No sensory deficit. Coordination normal.       CN 3-12 intact.  No nystagmus.  5/5 and equal upper and lower extremity strength.  No past pointing.    Skin: Skin is warm and dry. No rash noted.  Psychiatric: She has a normal mood and affect. Her behavior is normal.    ED Course  Procedures (including critical care time)  Labs Reviewed - No data to display Ct Head Wo Contrast  04/16/2012  *RADIOLOGY REPORT*  Clinical Data: 78 year old female injury 2 weeks ago.  High blood pressure and headache.  Fall.  CT HEAD WITHOUT CONTRAST  Technique:  Contiguous axial images were obtained from the base of the skull through the vertex without contrast.  Comparison: 03/31/2012.  Findings: Visualized paranasal sinuses and mastoids are clear. Stable orbit soft tissues.  Stable scalp soft tissues.  Calvarium intact.  No ventriculomegaly. No midline shift, mass effect, or evidence of  mass lesion.  No evidence of cortically based acute infarction identified.  Stable gray-white matter differentiation throughout the brain.  No acute intracranial hemorrhage identified.  No suspicious intracranial vascular hyperdensity.  IMPRESSION: Stable.  Negative for age noncontrast CT appearance of the brain.   Original Report Authenticated By: Erskine Speed, M.D.      1. Headache       MDM  [phone] F sent from urgent care for further evaluation of headache x 2d.  Minor head trauma 2 weeks ago.  Not anti-coagulated.  On exam, pt well-appearing, afebrile, hypertensive, no focal neuro deficits or meningeal signs.  CT head unremarkable.  Results discussed w/ pt and she has been reassured.  Received 1 ultram for pain in ED and d/c'd home w/ same.  Return precautions discussed.           Otilio Miu, PA-C 04/17/12 [phone]

## 2012-04-20 NOTE — ED Provider Notes (Signed)
Medical screening examination/treatment/procedure(s) were performed by non-physician practitioner and as supervising physician I was immediately available for consultation/collaboration.  Gilda Crease, MD 04/20/12 2105

## 2012-04-23 DIAGNOSIS — R5381 Other malaise: Secondary | ICD-10-CM | POA: Diagnosis not present

## 2012-04-23 DIAGNOSIS — I10 Essential (primary) hypertension: Secondary | ICD-10-CM | POA: Diagnosis not present

## 2012-04-23 DIAGNOSIS — E559 Vitamin D deficiency, unspecified: Secondary | ICD-10-CM | POA: Diagnosis not present

## 2012-04-23 DIAGNOSIS — E785 Hyperlipidemia, unspecified: Secondary | ICD-10-CM | POA: Diagnosis not present

## 2012-04-25 DIAGNOSIS — L299 Pruritus, unspecified: Secondary | ICD-10-CM | POA: Diagnosis not present

## 2012-04-25 DIAGNOSIS — G609 Hereditary and idiopathic neuropathy, unspecified: Secondary | ICD-10-CM | POA: Diagnosis not present

## 2012-04-25 DIAGNOSIS — M4800 Spinal stenosis, site unspecified: Secondary | ICD-10-CM | POA: Diagnosis not present

## 2012-04-25 DIAGNOSIS — I10 Essential (primary) hypertension: Secondary | ICD-10-CM | POA: Diagnosis not present

## 2012-05-01 DIAGNOSIS — M1710 Unilateral primary osteoarthritis, unspecified knee: Secondary | ICD-10-CM | POA: Diagnosis not present

## 2012-05-01 DIAGNOSIS — IMO0002 Reserved for concepts with insufficient information to code with codable children: Secondary | ICD-10-CM | POA: Diagnosis not present

## 2012-05-29 DIAGNOSIS — M545 Low back pain: Secondary | ICD-10-CM | POA: Diagnosis not present

## 2012-05-29 DIAGNOSIS — R350 Frequency of micturition: Secondary | ICD-10-CM | POA: Diagnosis not present

## 2012-05-29 DIAGNOSIS — I10 Essential (primary) hypertension: Secondary | ICD-10-CM | POA: Diagnosis not present

## 2012-05-29 DIAGNOSIS — M1710 Unilateral primary osteoarthritis, unspecified knee: Secondary | ICD-10-CM | POA: Diagnosis not present

## 2012-07-05 DIAGNOSIS — N390 Urinary tract infection, site not specified: Secondary | ICD-10-CM | POA: Diagnosis not present

## 2012-07-05 DIAGNOSIS — N3020 Other chronic cystitis without hematuria: Secondary | ICD-10-CM | POA: Diagnosis not present

## 2012-07-23 DIAGNOSIS — H4011X0 Primary open-angle glaucoma, stage unspecified: Secondary | ICD-10-CM | POA: Diagnosis not present

## 2012-07-23 DIAGNOSIS — H409 Unspecified glaucoma: Secondary | ICD-10-CM | POA: Diagnosis not present

## 2012-08-02 DIAGNOSIS — N390 Urinary tract infection, site not specified: Secondary | ICD-10-CM | POA: Diagnosis not present

## 2012-08-02 DIAGNOSIS — R3129 Other microscopic hematuria: Secondary | ICD-10-CM | POA: Diagnosis not present

## 2012-08-09 DIAGNOSIS — N281 Cyst of kidney, acquired: Secondary | ICD-10-CM | POA: Diagnosis not present

## 2012-08-09 DIAGNOSIS — R3129 Other microscopic hematuria: Secondary | ICD-10-CM | POA: Diagnosis not present

## 2012-08-09 DIAGNOSIS — K449 Diaphragmatic hernia without obstruction or gangrene: Secondary | ICD-10-CM | POA: Diagnosis not present

## 2012-08-16 DIAGNOSIS — R3129 Other microscopic hematuria: Secondary | ICD-10-CM | POA: Diagnosis not present

## 2012-08-21 ENCOUNTER — Encounter: Payer: Self-pay | Admitting: *Deleted

## 2012-08-21 ENCOUNTER — Ambulatory Visit (INDEPENDENT_AMBULATORY_CARE_PROVIDER_SITE_OTHER): Payer: Medicare Other | Admitting: Internal Medicine

## 2012-08-21 ENCOUNTER — Encounter: Payer: Self-pay | Admitting: Internal Medicine

## 2012-08-21 VITALS — BP 118/68 | HR 66 | Temp 97.3°F | Resp 18 | Ht 61.0 in | Wt 151.0 lb

## 2012-08-21 DIAGNOSIS — I10 Essential (primary) hypertension: Secondary | ICD-10-CM

## 2012-08-21 DIAGNOSIS — R21 Rash and other nonspecific skin eruption: Secondary | ICD-10-CM

## 2012-08-21 MED ORDER — TRIAMCINOLONE ACETONIDE 0.1 % EX CREA: TOPICAL_CREAM | CUTANEOUS | Status: DC

## 2012-08-21 NOTE — Progress Notes (Signed)
  Subjective:    Patient ID: Kristin Wilkerson, female    DOB: 03-07-1926, 79 y.o.   MRN: [id number]  HPI Has been seeing the urologist, Dr. Brunilda Payor. Has been undergoing tests for microscopic hematuria. He has done cysto and CT scan. Location of bleeding has not been determined.   Has painful arthritis of fingers.  Has a triangular patch on the left breast that is slightly scaling and is erythematous. She thinks this may have started after she was working in her garden.   Review of Systems  Constitutional: Negative.   Eyes: Negative.   Respiratory: Negative.   Cardiovascular: Negative.   Gastrointestinal: Negative.   Endocrine: Negative.   Genitourinary: Positive for urgency and frequency.       Nocturia  Skin: Positive for rash.  Allergic/Immunologic: Negative.   Neurological:       Tingling sensations in lower extremities  Hematological: Negative.   Psychiatric/Behavioral: Negative.        Objective:   Physical Exam  Constitutional: She is oriented to person, place, and time. She appears well-developed and well-nourished. No distress.  HENT:  Head: Normocephalic.  Right Ear: External ear normal.  Left Ear: External ear normal.  Nose: Nose normal.  Eyes: Conjunctivae and EOM are normal. Pupils are equal, round, and reactive to light.  Neck: Normal range of motion. Neck supple. No JVD present. No tracheal deviation present. No thyromegaly present.  Cardiovascular: Exam reveals no distant heart sounds and no friction rub.   Murmur heard.  Systolic murmur is present with a grade of 2/6  Holosystolic  Pulmonary/Chest: Breath sounds normal. No respiratory distress. She has no wheezes. She has no rales. She exhibits no tenderness.  Abdominal: Soft. Bowel sounds are normal. She exhibits no distension and no mass. There is no tenderness.  Musculoskeletal: Normal range of motion. She exhibits no edema and no tenderness.  Lymphadenopathy:    She has no cervical adenopathy.   Neurological: She is alert and oriented to person, place, and time. She has normal reflexes.  Skin: Skin is warm and dry. Rash noted. No erythema. No pallor.  There is rash in the upper left chest. There is a triangular erythematous area about 1.5 x 2 cm. It looks like contact dermatitis.  Psychiatric: She has a normal mood and affect. Her behavior is normal. Judgment and thought content normal.          Assessment & Plan:  Rash and nonspecific skin eruption  Etiology uncertain. We'll try steroid cream. Patient advised to call me back if it is not significantly improved within 2 weeks. Essential hypertension, benign  controlled

## 2012-08-21 NOTE — Patient Instructions (Signed)
Apply cream sparingly to skin lesions up to twice daily. If rash at the left breast is not better in 2 weeks, call for referral to dermatologist.

## 2012-08-23 ENCOUNTER — Other Ambulatory Visit: Payer: Self-pay | Admitting: *Deleted

## 2012-08-23 DIAGNOSIS — E785 Hyperlipidemia, unspecified: Secondary | ICD-10-CM

## 2012-08-27 ENCOUNTER — Ambulatory Visit (INDEPENDENT_AMBULATORY_CARE_PROVIDER_SITE_OTHER): Payer: Medicare Other | Admitting: Nurse Practitioner

## 2012-08-27 ENCOUNTER — Encounter: Payer: Self-pay | Admitting: Nurse Practitioner

## 2012-08-27 VITALS — BP 138/70 | HR 60 | Temp 97.1°F | Resp 16 | Ht 61.0 in | Wt 148.2 lb

## 2012-08-27 DIAGNOSIS — J309 Allergic rhinitis, unspecified: Secondary | ICD-10-CM | POA: Diagnosis not present

## 2012-08-27 MED ORDER — SACCHAROMYCES BOULARDII 250 MG PO CAPS: 250.0000 mg | ORAL_CAPSULE | Freq: Two times a day (BID) | ORAL | Status: DC

## 2012-08-27 NOTE — Patient Instructions (Addendum)
Call for appt if you start having diarrhea and bring stool sample  May use loratadine (claritin) for allergies       Clostridium Difficile Infection Clostridium difficile (C. diff) is a bacteria found in the intestinal tract or colon. Under certain conditions, it causes diarrhea and sometimes severe disease. The severe form of the disease is known as pseudomembranous colitis (often called C. diff colitis). This disease can damage the lining of the colon or cause the colon to become enlarged (toxic megacolon).  CAUSES  Your colon normally contains many different bacteria, including C. diff. The balance of bacteria in your colon can change during illness. This is especially true when you take antibiotic medicine. Taking antibiotics may allow the C. diff to grow, multiply excessively, and make a toxin that then causes illness. The elderly and people with certain medical conditions have a greater risk of getting C. diff infections. SYMPTOMS   Watery diarrhea.  Fever.  Fatigue.  Loss of appetite.  Nausea.  Abdominal swelling, pain, or tenderness.  Dehydration. DIAGNOSIS  Your symptoms may make your caregiver suspicious of a C. diff infection, especially if you have used antibiotics in the preceding weeks. However, there are only 2 ways to know for certain whether you have a C. diff infection:  A lab test that finds the toxin in your stool.  The specific appearance of an abnormality (pseudomembrane) in your colon. This can only be seen by doing a sigmoidoscopy or colonoscopy. These procedures involve passing an instrument through your rectum to look at the inside of your colon. Your caregiver will help determine if these tests are necessary. TREATMENT   Most people are successfully treated with 1 of 2 specific antibiotics, usually given by mouth. Other antibiotics you are receiving are stopped if possible.  Intravenous (IV) fluids and correction of electrolyte imbalance may be  necessary.  Rarely, surgery may be needed to remove the infected part of the intestines.  Careful hand washing by you and your caregivers is important to prevent the spread of infection. In the hospital, your caregivers may also put on gowns and gloves to prevent the spread of the C. diff bacteria. Your room is also cleaned regularly with a hospital grade disinfectant. HOME CARE INSTRUCTIONS  Drink enough fluids to keep your urine clear or pale yellow. Avoid milk, caffeine, and alcohol.  Ask your caregiver for specific rehydration instructions.  Try eating small, frequent meals rather than large meals.  Take your antibiotics as directed. Finish them even if you start to feel better.  Do not use medicines to slow diarrhea. This could delay healing or cause complications.  Wash your hands thoroughly after using the bathroom and before preparing food.  Make sure people who live with you wash their hands often, too. SEEK MEDICAL CARE IF:  Diarrhea persists longer than expected or recurs after completing your course of antibiotic treatment for the C. diff infection.  You have trouble staying hydrated. SEEK IMMEDIATE MEDICAL CARE IF:  You develop a new fever.  You have increasing abdominal pain or tenderness.  There is blood in your stools, or your stools are dark black and tarry.  You cannot hold down food or liquids. MAKE SURE YOU:   Understand these instructions.  Will watch your condition.  Will get help right away if you are not doing well or get worse. Document Released: 01/12/2005 Document Revised: 06/27/2011 Document Reviewed: 09/10/2010 [facility] Patient Information 2013 [place], [place].

## 2012-08-27 NOTE — Progress Notes (Signed)
Patient ID: Kristin Wilkerson, female   DOB: 1926-01-17, 79 y.o.   MRN: [id number]   No Known Allergies  Chief Complaint  Patient presents with  . husband was told he has c-diff    [facility] doctor stated the wife   . needs antibiotic    HPI: Patient is a 79 y.o. female seen in the office today due to her husband being very sick went to doctor and was dx with c diff- is here today because his doctor toler her to follow up here Denies fevers, chills, nausea, vomiting, diarrhea. Reports feeling fine.   Review of Systems:  Review of Systems  Constitutional: Negative for fever, chills and malaise/fatigue.  Respiratory: Negative for cough.   Cardiovascular: Negative for chest pain and palpitations.  Gastrointestinal: Negative for heartburn, nausea, vomiting, abdominal pain, diarrhea and constipation.  Musculoskeletal: Negative for myalgias, back pain and joint pain.  Neurological: Negative for weakness.    Past Medical History  Diagnosis Date  . Hypertension   . Abnormality of gait   . Disturbance of skin sensation   . Urinary frequency   . Tachycardia, unspecified   . Internal hemorrhoids without mention of complication   . Unspecified pruritic disorder   . Insomnia, unspecified   . Synovial cyst of popliteal space   . Spinal stenosis, other region   . Sciatica   . Lumbago   . Unspecified vitamin D deficiency   . Anxiety   . Hyperlipidemia    Past Surgical History  Procedure Laterality Date  . Lumbar laminectomy  12/04/2012    Complete decompressive L3-4 and L4-5--Dr. Darrelyn Hillock  . Abdominal hysterectomy    . Cholecystectomy     Social History:   reports that she has never smoked. She does not have any smokeless tobacco history on file. She reports that she does not drink alcohol or use illicit drugs.  Family History  Problem Relation Age of Onset  . Cancer Sister     Medications: Patient's Medications  New Prescriptions   No medications on file  Previous Medications   AMLODIPINE (NORVASC) 5 MG TABLET    Take 2.5 mg by mouth daily.   ASPIRIN EC 81 MG TABLET    Take 81 mg by mouth daily.   CALCIUM CARBONATE-VITAMIN D (CALCIUM 600+D) 600-400 MG-UNIT PER TABLET    Take one tablet by mouth twice daily   CHOLECALCIFEROL (VITAMIN D) 1000 UNITS TABLET    Take one tablet once daily for vitamin d   CITALOPRAM (CELEXA) 20 MG TABLET    Take 20 mg by mouth daily.   LATANOPROST (XALATAN) 0.005 % OPHTHALMIC SOLUTION    Instill one drop to both eyes at bedtime to treat glaucoma   LOSARTAN (COZAAR) 100 MG TABLET    Take 100 mg by mouth daily. Take one tablet once a day for blood pressure   METOPROLOL (LOPRESSOR) 50 MG TABLET    Take 25 mg by mouth daily.   SIMVASTATIN (ZOCOR) 20 MG TABLET    Take 20 mg by mouth every evening.   TRAMADOL (ULTRAM) 50 MG TABLET    Take 1 tablet (50 mg total) by mouth every 6 (six) hours as needed for pain.   TRIAMCINOLONE CREAM (KENALOG) 0.1 %    Apply sparingly to lesion on left breast twice daily  Modified Medications   No medications on file  Discontinued Medications   No medications on file     Physical Exam:  Filed Vitals:   08/27/12 1158  BP:  138/70  Pulse: 60  Temp: 97.1 F (36.2 C)  Resp: 16  Height: 5\' 1"  (1.549 m)  Weight: 148 lb 3.2 oz (67.223 kg)  Physical Exam  Constitutional: She is oriented to person, place, and time. She appears well-developed and well-nourished. No distress.  Cardiovascular: Normal rate, regular rhythm and normal heart sounds.   Pulmonary/Chest: Effort normal and breath sounds normal.  Abdominal: Soft. Bowel sounds are normal.  Musculoskeletal: Normal range of motion.  Neurological: She is alert and oriented to person, place, and time.  Skin: Skin is warm and dry. She is not diaphoretic.  Psychiatric: She has a normal mood and affect.     Assessment/Plan Allergic rhinitis-  May use Claritin daily, plain saline spray as needed    Exposure to C diff- currently without symptoms- diarrhea  occurs to make appt and to bring in stool sample Probiotics daily Yogurt daily

## 2012-09-11 DIAGNOSIS — M19049 Primary osteoarthritis, unspecified hand: Secondary | ICD-10-CM | POA: Diagnosis not present

## 2012-09-11 DIAGNOSIS — M1710 Unilateral primary osteoarthritis, unspecified knee: Secondary | ICD-10-CM | POA: Diagnosis not present

## 2012-09-18 ENCOUNTER — Ambulatory Visit (INDEPENDENT_AMBULATORY_CARE_PROVIDER_SITE_OTHER): Payer: Medicare Other | Admitting: Internal Medicine

## 2012-09-18 ENCOUNTER — Encounter: Payer: Self-pay | Admitting: *Deleted

## 2012-09-18 ENCOUNTER — Encounter: Payer: Self-pay | Admitting: Internal Medicine

## 2012-09-18 VITALS — BP 156/80 | HR 64 | Temp 97.1°F | Resp 18 | Ht <= 58 in | Wt 149.4 lb

## 2012-09-18 DIAGNOSIS — IMO0001 Reserved for inherently not codable concepts without codable children: Secondary | ICD-10-CM | POA: Diagnosis not present

## 2012-09-18 DIAGNOSIS — I10 Essential (primary) hypertension: Secondary | ICD-10-CM | POA: Diagnosis not present

## 2012-09-18 DIAGNOSIS — R5383 Other fatigue: Secondary | ICD-10-CM

## 2012-09-18 DIAGNOSIS — M549 Dorsalgia, unspecified: Secondary | ICD-10-CM | POA: Diagnosis not present

## 2012-09-18 DIAGNOSIS — R5381 Other malaise: Secondary | ICD-10-CM | POA: Diagnosis not present

## 2012-09-18 MED ORDER — LATANOPROST 0.005 % OP SOLN: OPHTHALMIC | Status: DC

## 2012-09-18 MED ORDER — DULOXETINE HCL 30 MG PO CPEP: ORAL_CAPSULE | ORAL | Status: DC

## 2012-09-18 NOTE — Progress Notes (Signed)
  Subjective:    Patient ID: Kristin Wilkerson, female    DOB: 1925-12-27, 79 y.o.   MRN: [id number]  HPI Saw Dr. Darrelyn Hillock on 09/11/12 for arthritis of the left index finger and right great toe. He gave her indomethacin. The joints feel better, but she is having issues that she thinks the indomethacin causes. She is shaking, legs are like lead, there is a rash on the left arm. There is pain in the left arm that goes in to the neck. She is going to stop the indomethacin.  Pain in proximal musculature in shoulders and right hip and thighs.  Sinus congestion. Took Claritin with some relief. Eyes are filmy and she does not see as well. Has to clean crusts from them each morning. Has a dull headache like a sinus headache.   Saw Dr. Brunilda Payor for her bladder. Still has microscopic hematuria. Has urgency. No blood. Incontinent sometimes. Nocturia x 1.   Prior patch of red skin on the chest has gone away.   Review of Systems  Constitutional: Negative.   Eyes: Negative.   Respiratory: Negative.   Cardiovascular: Negative.   Gastrointestinal: Negative.   Endocrine: Negative.   Genitourinary: Positive for urgency and frequency.       Nocturia  Musculoskeletal: Positive for myalgias and arthralgias.  Skin: Negative for rash.  Allergic/Immunologic: Negative.   Neurological:       Tingling sensations in lower extremities  Hematological: Negative.   Psychiatric/Behavioral: Negative.        Objective:BP 156/80  Pulse 64  Temp(Src) 97.1 F (36.2 C)  Resp 18  Ht 5.5" (0.14 m)  Wt 149 lb 6.4 oz (67.767 kg)  BMI 3,457.5 kg/m2    Physical Exam  Constitutional: She is oriented to person, place, and time. She appears well-developed and well-nourished. No distress.  HENT:  Head: Normocephalic.  Right Ear: External ear normal.  Left Ear: External ear normal.  Nose: Nose normal.  Eyes: Conjunctivae and EOM are normal. Pupils are equal, round, and reactive to light.  Neck: Normal range of motion. Neck  supple. No JVD present. No tracheal deviation present. No thyromegaly present.  Cardiovascular: Exam reveals no distant heart sounds and no friction rub.   Murmur heard.  Systolic murmur is present with a grade of 2/6  Holosystolic  Pulmonary/Chest: Breath sounds normal. No respiratory distress. She has no wheezes. She has no rales. She exhibits no tenderness.  Abdominal: Soft. Bowel sounds are normal. She exhibits no distension and no mass. There is no tenderness.  Musculoskeletal: Normal range of motion. She exhibits no edema and no tenderness.  Lymphadenopathy:    She has no cervical adenopathy.  Neurological: She is alert and oriented to person, place, and time. She has normal reflexes.  Skin: Skin is warm and dry. No rash noted. No erythema. No pallor.  Psychiatric: She has a normal mood and affect. Her behavior is normal. Judgment and thought content normal.          Assessment & Plan:  1. Myalgia and myositis, unspecified Etiology has not been determined. Will do lab to rule out myositis and polymyalgia rheumatica. - CBC with Differential - CK - Sedimentation Rate - DULoxetine (CYMBALTA) 30 MG capsule; One daily  To help pains and nerves.  Dispense: 30 capsule; Refill: 3  2. Other malaise and fatigue Etiology not determined  3. Essential hypertension, benign Controlled - CMP  4. BACK PAIN, CHRONIC Chronic. Unchanged.

## 2012-09-18 NOTE — Patient Instructions (Signed)
Start new drug, Cymbalta.

## 2012-09-19 LAB — CBC WITH DIFFERENTIAL/PLATELET
Basos: 1 % (ref 0–3)
Eos: 7 % — ABNORMAL HIGH (ref 0–5)
HCT: 40.4 % (ref 34.0–46.6)
Immature Grans (Abs): 0 10*3/uL (ref 0.0–0.1)
Immature Granulocytes: 0 % (ref 0–2)
MCV: 92 fL (ref 79–97)
Monocytes Absolute: 0.7 10*3/uL (ref 0.1–0.9)
Monocytes: 13 % — ABNORMAL HIGH (ref 4–12)
Neutrophils Relative %: 46 % (ref 40–74)
RBC: 4.41 x10E6/uL (ref 3.77–5.28)
RDW: 13.9 % (ref 12.3–15.4)
WBC: 5.9 10*3/uL (ref 3.4–10.8)

## 2012-09-19 LAB — COMPREHENSIVE METABOLIC PANEL
ALT: 17 IU/L (ref 0–32)
Albumin: 4.6 g/dL (ref 3.5–4.7)
Alkaline Phosphatase: 69 IU/L (ref 39–117)
BUN: 18 mg/dL (ref 8–27)
CO2: 24 mmol/L (ref 19–28)
Chloride: 102 mmol/L (ref 97–108)
Glucose: 83 mg/dL (ref 65–99)
Potassium: 4.3 mmol/L (ref 3.5–5.2)
Total Protein: 6.8 g/dL (ref 6.0–8.5)

## 2012-09-19 LAB — SEDIMENTATION RATE: Sed Rate: 3 mm/hr (ref 0–40)

## 2012-10-08 ENCOUNTER — Other Ambulatory Visit: Payer: Self-pay | Admitting: *Deleted

## 2012-10-08 MED ORDER — CITALOPRAM HYDROBROMIDE 20 MG PO TABS: ORAL_TABLET | ORAL | Status: DC

## 2012-10-22 ENCOUNTER — Other Ambulatory Visit: Payer: Medicare Other

## 2012-10-22 DIAGNOSIS — E785 Hyperlipidemia, unspecified: Secondary | ICD-10-CM | POA: Diagnosis not present

## 2012-10-23 ENCOUNTER — Encounter: Payer: Self-pay | Admitting: *Deleted

## 2012-10-23 LAB — LIPID PANEL
Cholesterol, Total: 180 mg/dL (ref 100–199)
LDL Calculated: 106 mg/dL — ABNORMAL HIGH (ref 0–99)
Triglycerides: 102 mg/dL (ref 0–149)

## 2012-10-24 ENCOUNTER — Encounter: Payer: Self-pay | Admitting: Internal Medicine

## 2012-10-24 ENCOUNTER — Ambulatory Visit (INDEPENDENT_AMBULATORY_CARE_PROVIDER_SITE_OTHER): Payer: Medicare Other | Admitting: Internal Medicine

## 2012-10-24 VITALS — BP 130/70 | HR 69 | Temp 98.8°F | Resp 18 | Ht 65.0 in | Wt 150.4 lb

## 2012-10-24 DIAGNOSIS — IMO0001 Reserved for inherently not codable concepts without codable children: Secondary | ICD-10-CM | POA: Diagnosis not present

## 2012-10-24 DIAGNOSIS — M549 Dorsalgia, unspecified: Secondary | ICD-10-CM

## 2012-10-24 DIAGNOSIS — E785 Hyperlipidemia, unspecified: Secondary | ICD-10-CM

## 2012-10-24 DIAGNOSIS — R5383 Other fatigue: Secondary | ICD-10-CM

## 2012-10-24 DIAGNOSIS — I10 Essential (primary) hypertension: Secondary | ICD-10-CM

## 2012-10-24 DIAGNOSIS — R5381 Other malaise: Secondary | ICD-10-CM | POA: Diagnosis not present

## 2012-10-24 MED ORDER — DULOXETINE HCL 30 MG PO CPEP: ORAL_CAPSULE | ORAL | Status: DC

## 2012-10-24 NOTE — Progress Notes (Signed)
Subjective:    Patient ID: Kristin Wilkerson, female    DOB: 18-Sep-1925, 79 y.o.   MRN: [id number]  HPI Started Cymbalta for arthritis and muscular pain one month ago. She stopped indomethacin due to shaking that she felt was related to use of that drug. Pain has improved in the last month. Still has trouble in neck and shoulder. Overhead work makes her shoulders ache. BP is controlled. Lipids arecontrolled.  Current Outpatient Prescriptions on File Prior to Visit  Medication Sig Dispense Refill  . amLODipine (NORVASC) 5 MG tablet Take 2.5 mg by mouth daily.      [name] aspirin EC 81 MG tablet Take 81 mg by mouth daily.      . Calcium Carbonate-Vitamin D (CALCIUM 600+D) 600-400 MG-UNIT per tablet Take one tablet by mouth twice daily      . cholecalciferol (VITAMIN D) 1000 UNITS tablet Take one tablet once daily for vitamin d      . citalopram (CELEXA) 20 MG tablet Take one tablet by mouth once daily to help depression  90 tablet  3  . DULoxetine (CYMBALTA) 30 MG capsule One daily  To help pains and nerves.  30 capsule  3  . latanoprost (XALATAN) 0.005 % ophthalmic solution Instill one drop to both eyes at bedtime to treat glaucoma  7.5 mL  4  . losartan (COZAAR) 100 MG tablet Take 100 mg by mouth daily. Take one tablet once a day for blood pressure      . metoprolol (LOPRESSOR) 50 MG tablet Take 25 mg by mouth daily.      . simvastatin (ZOCOR) 20 MG tablet Take 20 mg by mouth every evening.      . traMADol (ULTRAM) 50 MG tablet Take 1 tablet (50 mg total) by mouth every 6 (six) hours as needed for pain.  12 tablet  0  . triamcinolone cream (KENALOG) 0.1 % Apply sparingly to lesion on left breast twice daily  45 g  3       Review of Systems  Constitutional: Negative.   Eyes: Negative.   Respiratory: Negative.   Cardiovascular: Negative.   Gastrointestinal: Negative.   Endocrine: Negative.   Genitourinary: Positive for urgency and frequency.       Nocturia  Musculoskeletal: Positive for  myalgias and arthralgias.  Skin: Negative for rash.  Allergic/Immunologic: Negative.   Neurological:       Tingling sensations in lower extremities  Hematological: Negative.   Psychiatric/Behavioral: Negative.        Objective:BP 130/70  Pulse 69  Temp(Src) 98.8 F (37.1 C) (Oral)  Resp 18  Ht 5\' 5"  (1.651 m)  Wt 150 lb 6.4 oz (68.221 kg)  BMI 25.03 kg/m2  SpO2 99%    Physical Exam  Constitutional: She is oriented to person, place, and time. She appears well-developed and well-nourished. No distress.  HENT:  Head: Normocephalic.  Right Ear: External ear normal.  Left Ear: External ear normal.  Nose: Nose normal.  Eyes: Conjunctivae and EOM are normal. Pupils are equal, round, and reactive to light.  Neck: Normal range of motion. Neck supple. No JVD present. No tracheal deviation present. No thyromegaly present.  Cardiovascular: Exam reveals no distant heart sounds and no friction rub.   Murmur heard.  Systolic murmur is present with a grade of 2/6  Holosystolic  Pulmonary/Chest: Breath sounds normal. No respiratory distress. She has no wheezes. She has no rales. She exhibits no tenderness.  Abdominal: Soft. Bowel sounds are normal. She exhibits  no distension and no mass. There is no tenderness.  Musculoskeletal: Normal range of motion. She exhibits no edema and no tenderness.  Lymphadenopathy:    She has no cervical adenopathy.  Neurological: She is alert and oriented to person, place, and time. She has normal reflexes.  Skin: Skin is warm and dry. No rash noted. No erythema. No pallor.  Psychiatric: She has a normal mood and affect. Her behavior is normal. Judgment and thought content normal.    Appointment on 10/22/2012  Component Date Value Range Status  . Cholesterol, Total 10/22/2012 180  100 - 199 mg/dL Final  . Triglycerides 10/22/2012 102  0 - 149 mg/dL Final  . HDL 21/30/8657 54  >39 mg/dL Final   Comment: According to ATP-III Guidelines, HDL-C >59 mg/dL is  considered a                          negative risk factor for CHD.  [name] VLDL Cholesterol Cal 10/22/2012 20  5 - 40 mg/dL Final  . LDL Calculated 10/22/2012 846* 0 - 99 mg/dL Final  . Chol/HDL Ratio 10/22/2012 3.3  0.0 - 4.4 ratio units Final       Assessment & Plan:  Essential hypertension, benign: controlled  BACK PAIN, CHRONIC;  improved  Myalgia and myositis, unspecified -improved   Plan: DULoxetine (CYMBALTA) 30 MG capsule  Other malaise and fatigue:; improved  Other and unspecified hyperlipidemia: controlled

## 2012-10-24 NOTE — Patient Instructions (Signed)
Use up citalopram and stop (do not refill).

## 2012-10-31 ENCOUNTER — Other Ambulatory Visit: Payer: Self-pay | Admitting: Geriatric Medicine

## 2012-10-31 MED ORDER — SIMVASTATIN 20 MG PO TABS: 20.0000 mg | ORAL_TABLET | Freq: Every evening | ORAL | Status: DC

## 2012-11-08 DIAGNOSIS — M1710 Unilateral primary osteoarthritis, unspecified knee: Secondary | ICD-10-CM | POA: Diagnosis not present

## 2012-11-13 ENCOUNTER — Telehealth: Payer: Self-pay | Admitting: Cardiovascular Disease

## 2012-11-13 NOTE — Telephone Encounter (Signed)
Pt stated that she is waking up in the middle of the night with a rapid heart beat. She does not have it during the day.

## 2012-11-13 NOTE — Telephone Encounter (Signed)
Returned call.  Pt still not at home per female answering.  Informed RN will await pt's call back.

## 2012-11-13 NOTE — Telephone Encounter (Signed)
Returned call.  Left message w/ female answering for pt to call back today before 4pm.  Agreed to inform pt.

## 2012-11-14 ENCOUNTER — Telehealth: Payer: Self-pay | Admitting: Cardiovascular Disease

## 2012-11-20 ENCOUNTER — Encounter: Payer: Self-pay | Admitting: Internal Medicine

## 2012-11-20 ENCOUNTER — Ambulatory Visit (INDEPENDENT_AMBULATORY_CARE_PROVIDER_SITE_OTHER): Payer: Medicare Other | Admitting: Internal Medicine

## 2012-11-20 VITALS — BP 120/72 | HR 84 | Temp 98.6°F | Resp 18 | Ht 65.0 in | Wt 146.6 lb

## 2012-11-20 DIAGNOSIS — I10 Essential (primary) hypertension: Secondary | ICD-10-CM

## 2012-11-20 DIAGNOSIS — IMO0001 Reserved for inherently not codable concepts without codable children: Secondary | ICD-10-CM | POA: Diagnosis not present

## 2012-11-20 DIAGNOSIS — M549 Dorsalgia, unspecified: Secondary | ICD-10-CM

## 2012-11-20 NOTE — Progress Notes (Signed)
Subjective:    Patient ID: Kristin Wilkerson, female    DOB: [birth year], 79 y.o.   MRN: [id number]  HPI She thinks the Cymbalta has created hot flashes. This is listed as a common side effect. Myalgias are a little better.   Essential hypertension, benign: controlled  BACK PAIN, CHRONIC: improved  Myalgia and myositis, unspecified: improved  Had injection in right knee.   Current Outpatient Prescriptions on File Prior to Visit  Medication Sig Dispense Refill  . amLODipine (NORVASC) 5 MG tablet Take 2.5 mg by mouth daily.      [name] aspirin EC 81 MG tablet Take 81 mg by mouth daily.      . Calcium Carbonate-Vitamin D (CALCIUM 600+D) 600-400 MG-UNIT per tablet Take one tablet by mouth twice daily      . cholecalciferol (VITAMIN D) 1000 UNITS tablet Take one tablet once daily for vitamin d      . DULoxetine (CYMBALTA) 30 MG capsule One daily  To help pains and nerves.  90 capsule  3  . latanoprost (XALATAN) 0.005 % ophthalmic solution Instill one drop to both eyes at bedtime to treat glaucoma  7.5 mL  4  . losartan (COZAAR) 100 MG tablet Take 100 mg by mouth daily. Take one tablet once a day for blood pressure      . metoprolol (LOPRESSOR) 50 MG tablet Take 25 mg by mouth daily.      . simvastatin (ZOCOR) 20 MG tablet Take 1 tablet (20 mg total) by mouth every evening.  30 tablet  3  . traMADol (ULTRAM) 50 MG tablet Take 1 tablet (50 mg total) by mouth every 6 (six) hours as needed for pain.  12 tablet  0  . triamcinolone cream (KENALOG) 0.1 % Apply sparingly to lesion on left breast twice daily  45 g  3   No current facility-administered medications on file prior to visit.    Review of Systems  Constitutional: Negative.   Eyes: Negative.   Respiratory: Negative.   Cardiovascular: Negative.   Gastrointestinal: Negative.   Endocrine: Negative.   Genitourinary: Positive for urgency and frequency.       Nocturia  Musculoskeletal: Positive for myalgias and arthralgias.  Skin: Negative  for rash.  Allergic/Immunologic: Negative.   Neurological:       Tingling sensations in lower extremities  Hematological: Negative.   Psychiatric/Behavioral: Negative.        Objective:BP 120/72  Pulse 84  Temp(Src) 98.6 F (37 C) (Oral)  Resp 18  Ht 5\' 5"  (1.651 m)  Wt 146 lb 9.6 oz (66.497 kg)  BMI 24.4 kg/m2  SpO2 95%    Physical Exam  Constitutional: She is oriented to person, place, and time. She appears well-developed and well-nourished. No distress.  HENT:  Head: Normocephalic.  Right Ear: External ear normal.  Left Ear: External ear normal.  Nose: Nose normal.  Eyes: Conjunctivae and EOM are normal. Pupils are equal, round, and reactive to light.  Neck: Normal range of motion. Neck supple. No JVD present. No tracheal deviation present. No thyromegaly present.  Cardiovascular: Exam reveals no distant heart sounds and no friction rub.   Murmur heard.  Systolic murmur is present with a grade of 2/6  Holosystolic  Pulmonary/Chest: Breath sounds normal. No respiratory distress. She has no wheezes. She has no rales. She exhibits no tenderness.  Abdominal: Soft. Bowel sounds are normal. She exhibits no distension and no mass. There is no tenderness.  Musculoskeletal: Normal range of motion. She  exhibits no edema and no tenderness.  Lymphadenopathy:    She has no cervical adenopathy.  Neurological: She is alert and oriented to person, place, and time. She has normal reflexes.  Skin: Skin is warm and dry. No rash noted. No erythema. No pallor.  Psychiatric: She has a normal mood and affect. Her behavior is normal. Judgment and thought content normal.          Assessment & Plan:  Essential hypertension, benign: improved  BACK PAIN, CHRONIC: improved  Myalgia and myositis, unspecified: improved. She is willing to use Cymbalta another month.

## 2012-11-20 NOTE — Patient Instructions (Signed)
Continue current medications. 

## 2012-11-22 ENCOUNTER — Telehealth: Payer: Self-pay | Admitting: Cardiovascular Disease

## 2012-11-22 NOTE — Telephone Encounter (Signed)
Her primary doctor put her on Duloxetine,she wants to know if this is all right for her to take? It Makes her heat beat real strong at times in the morning.[name]

## 2012-11-22 NOTE — Telephone Encounter (Signed)
Message forwarded to Dr. Berry for review and further instructions  

## 2012-11-23 NOTE — Telephone Encounter (Signed)
OK to take cymbalta

## 2012-11-27 NOTE — Telephone Encounter (Signed)
Returned call and informed pt per instructions by MD/PA.  Pt verbalized understanding and agreed w/ plan.  Pt stated she has some fast heartbeats in the morning and it goes away by 12 noon.  Stated she doesn't have any pain.  Pt advised to inform Dr. Chilton Si if it is too bothersome for her and he may switch her to something else.  Pt stated she did and he asked her to try if for another month.  Pt will f/u with Dr. Chilton Si.

## 2012-12-04 HISTORY — PX: LUMBAR LAMINECTOMY: SHX95

## 2012-12-10 ENCOUNTER — Ambulatory Visit (INDEPENDENT_AMBULATORY_CARE_PROVIDER_SITE_OTHER): Payer: Medicare Other | Admitting: Cardiology

## 2012-12-10 ENCOUNTER — Encounter: Payer: Self-pay | Admitting: Cardiology

## 2012-12-10 VITALS — BP 132/70 | HR 64 | Ht 60.0 in | Wt 150.0 lb

## 2012-12-10 DIAGNOSIS — F411 Generalized anxiety disorder: Secondary | ICD-10-CM

## 2012-12-10 DIAGNOSIS — R000 Tachycardia, unspecified: Secondary | ICD-10-CM

## 2012-12-10 DIAGNOSIS — F419 Anxiety disorder, unspecified: Secondary | ICD-10-CM

## 2012-12-10 DIAGNOSIS — R079 Chest pain, unspecified: Secondary | ICD-10-CM | POA: Diagnosis not present

## 2012-12-10 NOTE — Assessment & Plan Note (Signed)
She complains of left upper chest pain. Tender to touch she also complains of left mid arm pain again tender to touch she feels is her arthritis but she is anxious as well that may be cardiac. It has been since 2011 since she's had a stress test we will proceed with LexiScan myoview to rule out cardiac ischemia.

## 2012-12-10 NOTE — Progress Notes (Signed)
12/10/2012   PCP: Kimber Relic, MD   Chief Complaint  Patient presents with  . office visit    heart beating fast for a couple of days.    Primary Cardiologist: Dr. Allyson Sabal  HPI:  79 year old female patient of Dr. Monia Sabal is is followed by him for hypertension and hyperlipidemia she had echo in 2013 and her last stress test was 2011 which was negative for ischemia. Teflon stress with her husband's medical issues as well. Today she presents with complaints of racing heart rate or heart when she awakens at 4 AM is racing she can go back to sleep easily but it will continue to race.  She was on a new medication her arthritis after having her knee injected she thinks that started the tachycardia but it continues over a month after stopping the medication. She is now on Cymbalta to help with her myalgias as well.    After she discussed her racing heart rate she mentioned that she was also having arthritis in her chest as well as left arm arthritis. Her EKG was without acute changes she was tender to touch in left anterior chest and in the left arm where she complained of the pain.  She is very anxious that the pain could be cardiac she thinks is arthritis but is worried that it could be her heart.   No Known Allergies  Current Outpatient Prescriptions  Medication Sig Dispense Refill  . amLODipine (NORVASC) 5 MG tablet Take 2.5 mg by mouth daily.      [name] aspirin EC 81 MG tablet Take 81 mg by mouth daily.      . Calcium Carbonate-Vitamin D (CALCIUM 600+D) 600-400 MG-UNIT per tablet Take one tablet by mouth twice daily      . cholecalciferol (VITAMIN D) 1000 UNITS tablet Take one tablet once daily for vitamin d      . DULoxetine (CYMBALTA) 30 MG capsule One daily  To help pains and nerves.  90 capsule  3  . latanoprost (XALATAN) 0.005 % ophthalmic solution Instill one drop to both eyes at bedtime to treat glaucoma  7.5 mL  4  . losartan (COZAAR) 100 MG tablet Take 100 mg by mouth daily.  Take one tablet once a day for blood pressure      . metoprolol (LOPRESSOR) 50 MG tablet Take 25 mg by mouth daily.      . simvastatin (ZOCOR) 20 MG tablet Take 1 tablet (20 mg total) by mouth every evening.  30 tablet  3   No current facility-administered medications for this visit.    Past Medical History  Diagnosis Date  . Hypertension   . Abnormality of gait   . Disturbance of skin sensation   . Urinary frequency   . Tachycardia, unspecified   . Internal hemorrhoids without mention of complication   . Unspecified pruritic disorder   . Insomnia, unspecified   . Synovial cyst of popliteal space   . Spinal stenosis, other region   . Sciatica   . Lumbago   . Unspecified vitamin D deficiency   . Anxiety   . Hyperlipidemia   . Abnormality of gait   . Unspecified sinusitis (chronic)   . Unspecified pruritic disorder   . Unspecified viral infection, in conditions classified elsewhere and of unspecified site   . Pain in joint, lower leg   . Depressive disorder, not elsewhere classified   . Unspecified glaucoma(365.9)   . Reflux esophagitis   .  Diverticulosis of colon (without mention of hemorrhage)   . Pain in joint, site unspecified   . Myalgia and myositis, unspecified   . Osteoporosis, unspecified   . Other malaise and fatigue   . Cervicalgia   . Senile osteoporosis   . Benign neoplasm of colon   . H/O echocardiogram 09/01/11    EF >55%, mild MR, RV systolic pressure elevated at 16-10 mmHg, mod TR  . Normal cardiac stress test 11/16/09    low risk scan, EF  77%    Past Surgical History  Procedure Laterality Date  . Lumbar laminectomy  12/04/2012    Complete decompressive L3-4 and L4-5--Dr. Darrelyn Hillock  . Abdominal hysterectomy    . Cholecystectomy      RUE:[id number]:XB colds or fevers, no weight changes Skin:no rashes or ulcers HEENT:no blurred vision, no congestion CV:see HPI PUL:see HPI GI:no diarrhea constipation or melena, no indigestion GU:no hematuria, no  dysuria MS:+ joint pain, no claudication Neuro:no syncope, no lightheadedness Endo:no diabetes, no thyroid disease  PHYSICAL EXAM BP 132/70  Pulse 64  Ht 5' (1.524 m)  Wt 150 lb (68.04 kg)  BMI 29.3 kg/m2 General:Pleasant affect, NAD Skin:Warm and dry, brisk capillary refill HEENT:normocephalic, sclera clear, mucus membranes moist Neck:supple, no JVD, no bruits  Heart:S1S2 RRR with 2/6 systolic murmur, no gallup, rub or click Lungs:clear without rales, rhonchi, or wheezes JYN:WGNF, non tender, + BS, do not palpate liver spleen or masses Ext:no lower ext edema, 2+ pedal pulses, 2+ radial pulses Neuro:alert and oriented, MAE, follows commands, + facial symmetry  EKG:SR without acute changes.  ASSESSMENT AND PLAN Tachycardia She wakes up with her heart racing in the morning around 4 AM. She can fall back to sleep  And eventually her heart racing goes away. Questionable PAF versus SVT she'll wear a CardioNet monitor so we can further evaluate the tachycardia. Please note before she left with a CardioNet in place he was here worried that she could wear it that she can deal with the latter he changes that she has no idea about computers. We told her she can deal with it just mail it back on but that we would try for nowl she did agree to wear it  Chest pain at rest She complains of left upper chest pain. Tender to touch she also complains of left mid arm pain again tender to touch she feels is her arthritis but she is anxious as well that may be cardiac. It has been since 2011 since she's had a stress test we will proceed with LexiScan myoview to rule out cardiac ischemia.  Anxiety The patient tells me she is on the Cymbalta to help with her arthritis in her nerves.  She has agreed to try for one more month.     She'll follow with Dr. Allyson Sabal once the tests are complete

## 2012-12-10 NOTE — Assessment & Plan Note (Signed)
She wakes up with her heart racing in the morning around 4 AM. She can fall back to sleep  And eventually her heart racing goes away. Questionable PAF versus SVT she'll wear a CardioNet monitor so we can further evaluate the tachycardia. Please note before she left with a CardioNet in place he was here worried that she could wear it that she can deal with the latter he changes that she has no idea about computers. We told her she can deal with it just mail it back on but that we would try for nowl she did agree to wear it

## 2012-12-10 NOTE — Assessment & Plan Note (Signed)
The patient tells me she is on the Cymbalta to help with her arthritis in her nerves.  She has agreed to try for one more month.

## 2012-12-10 NOTE — Patient Instructions (Signed)
We will have you have lab work done.  We will have you wear a monitor to evaluate the fast/racing heart rate  We will do a stress test within next 2 weeks to make sure your pain is arthritis.  You will see Dr. Allyson Sabal back once the stress test is complete.

## 2012-12-11 DIAGNOSIS — R000 Tachycardia, unspecified: Secondary | ICD-10-CM | POA: Diagnosis not present

## 2012-12-19 ENCOUNTER — Other Ambulatory Visit: Payer: Self-pay

## 2012-12-19 ENCOUNTER — Ambulatory Visit (HOSPITAL_COMMUNITY)
Admission: RE | Admit: 2012-12-19 | Discharge: 2012-12-19 | Disposition: A | Discharge status: Home / Self Care | Payer: Medicare Other | Source: Ambulatory Visit | Attending: Cardiovascular Disease | Admitting: Cardiovascular Disease

## 2012-12-19 DIAGNOSIS — R000 Tachycardia, unspecified: Secondary | ICD-10-CM | POA: Diagnosis not present

## 2012-12-19 DIAGNOSIS — R002 Palpitations: Secondary | ICD-10-CM | POA: Insufficient documentation

## 2012-12-19 DIAGNOSIS — R079 Chest pain, unspecified: Secondary | ICD-10-CM | POA: Insufficient documentation

## 2012-12-19 DIAGNOSIS — Z8249 Family history of ischemic heart disease and other diseases of the circulatory system: Secondary | ICD-10-CM | POA: Diagnosis not present

## 2012-12-19 DIAGNOSIS — I10 Essential (primary) hypertension: Secondary | ICD-10-CM | POA: Diagnosis not present

## 2012-12-19 DIAGNOSIS — R5381 Other malaise: Secondary | ICD-10-CM | POA: Diagnosis not present

## 2012-12-19 DIAGNOSIS — E663 Overweight: Secondary | ICD-10-CM | POA: Diagnosis not present

## 2012-12-19 DIAGNOSIS — R42 Dizziness and giddiness: Secondary | ICD-10-CM | POA: Insufficient documentation

## 2012-12-19 MED ORDER — AMLODIPINE BESYLATE 5 MG PO TABS: 2.5000 mg | ORAL_TABLET | Freq: Every day | ORAL | Status: DC

## 2012-12-19 MED ORDER — TECHNETIUM TC 99M SESTAMIBI GENERIC - CARDIOLITE
31.8000 | Freq: Once | INTRAVENOUS | Status: AC | PRN
  Administered 2012-12-19: 31.8 via INTRAVENOUS

## 2012-12-19 MED ORDER — TECHNETIUM TC 99M SESTAMIBI GENERIC - CARDIOLITE
10.8000 | Freq: Once | INTRAVENOUS | Status: AC | PRN
  Administered 2012-12-19: 11 via INTRAVENOUS

## 2012-12-19 MED ORDER — METOPROLOL TARTRATE 50 MG PO TABS: 25.0000 mg | ORAL_TABLET | Freq: Every day | ORAL | Status: DC

## 2012-12-19 MED ORDER — REGADENOSON 0.4 MG/5ML IV SOLN
0.4000 mg | Freq: Once | INTRAVENOUS | Status: AC
  Administered 2012-12-19: 0.4 mg via INTRAVENOUS

## 2012-12-19 NOTE — Procedures (Addendum)
[facility] [address] [place] [address] [phone]  Cardiology Nuclear Med Study  STAISHA Wilkerson is a 79 y.o. female     MRN : [id number]     DOB: [birth year]  Procedure Date: 12/19/2012  Nuclear Med Background Indication for Stress Test:  Evaluation for Ischemia History:  NO PRIOR HISTORY REPORTED Cardiac Risk Factors: Family History - CAD, Hypertension, Lipids, Overweight and TACHYCARDIA  Symptoms:  Chest Pain, Dizziness, Fatigue with Exertion, Light-Headedness, Palpitations and SOB   Nuclear Pre-Procedure Caffeine/Decaff Intake:  10:00pm NPO After: 8:00am   IV Site: R Hand  IV 0.9% NS with Angio Cath:  22g  Chest Size (in):  N/A IV Started by: Emmit Pomfret, RN  Height: 5' (1.524 m)  Cup Size: D  BMI:  Body mass index is 29.3 kg/(m^2). Weight:  150 lb (68.04 kg)   Tech Comments:  N/A    Nuclear Med Study 1 or 2 day study: 1 day  Stress Test Type:  Lexiscan  Order Authorizing Provider:  Obie Dredge   Resting Radionuclide: Technetium 20m Sestamibi  Resting Radionuclide Dose: 10.8 mCi   Stress Radionuclide:  Technetium 61m Sestamibi  Stress Radionuclide Dose: 31.8 mCi           Stress Protocol Rest HR: 62 Stress HR: 66  Rest BP: 138/77 Stress BP: 145/83  Exercise Time (min): n/a METS: n/a   Predicted Max HR: 136 bpm % Max HR: 57.35 bpm Rate Pressure Product: 40347  Dose of Adenosine (mg):  n/a Dose of Lexiscan: 0.4 mg  Dose of Atropine (mg): n/a Dose of Dobutamine: n/a mcg/kg/min (at max HR)  Stress Test Technologist: Esperanza Sheets, CCT Nuclear Technologist: Gonzella Lex, CNMT   Rest Procedure:  Myocardial perfusion imaging was performed at rest 45 minutes following the intravenous administration of Technetium 33m Sestamibi. Stress Procedure:  The patient received IV Lexiscan 0.4 mg over 15-seconds.  Technetium 30m Sestamibi injected at 30-seconds.  There were no significant changes with  Lexiscan.  Quantitative spect images were obtained after a 45 minute delay.  Transient Ischemic Dilatation (Normal <1.22):  1.01 Lung/Heart Ratio (Normal <0.45):  0.26 QGS EDV:  41 ml QGS ESV:  10 ml LV Ejection Fraction: 75%  Signed by      Rest ECG: NSR - Normal EKG  Stress ECG: No significant change from baseline ECG  QPS Raw Data Images:  Normal; no motion artifact; normal heart/lung ratio. Stress Images:  Normal homogeneous uptake in all areas of the myocardium. Rest Images:  Normal homogeneous uptake in all areas of the myocardium. Subtraction (SDS):  No evidence of ischemia.  Impression Exercise Capacity:  Lexiscan with no exercise. BP Response:  Normal blood pressure response. Clinical Symptoms:  No significant symptoms noted. ECG Impression:  No significant ST segment change suggestive of ischemia. Comparison with Prior Nuclear Study: No significant change from previous study  Overall Impression:  Normal stress nuclear study.  LV Wall Motion:  NL LV Function; NL Wall Motion   Runell Gess, MD  12/19/2012 6:41 PM

## 2012-12-20 ENCOUNTER — Other Ambulatory Visit: Payer: Self-pay | Admitting: *Deleted

## 2012-12-20 MED ORDER — AMLODIPINE BESYLATE 5 MG PO TABS: 2.5000 mg | ORAL_TABLET | Freq: Every day | ORAL | Status: DC

## 2012-12-26 ENCOUNTER — Ambulatory Visit: Payer: No Typology Code available for payment source | Admitting: Cardiovascular Disease

## 2012-12-27 ENCOUNTER — Encounter: Payer: Self-pay | Admitting: Cardiovascular Disease

## 2012-12-27 ENCOUNTER — Ambulatory Visit (INDEPENDENT_AMBULATORY_CARE_PROVIDER_SITE_OTHER): Payer: Medicare Other | Admitting: Cardiovascular Disease

## 2012-12-27 VITALS — BP 130/80 | HR 78 | Ht 60.0 in | Wt 151.0 lb

## 2012-12-27 DIAGNOSIS — R000 Tachycardia, unspecified: Secondary | ICD-10-CM | POA: Diagnosis not present

## 2012-12-27 DIAGNOSIS — R079 Chest pain, unspecified: Secondary | ICD-10-CM

## 2012-12-27 DIAGNOSIS — I10 Essential (primary) hypertension: Secondary | ICD-10-CM

## 2012-12-27 NOTE — Assessment & Plan Note (Signed)
She was seen by Bryna Colander registered nurse practitioner 12/10/12 with atypical chest pain. A Myoview stress test was negative. I do not think this is ischemic pain

## 2012-12-27 NOTE — Assessment & Plan Note (Signed)
She was complaining of palpitations which is a large angled back to weeks ago. An event monitor to showed sinus rhythm/sinus tachycardia. She admits to being a highly anxious.

## 2012-12-27 NOTE — Patient Instructions (Addendum)
Your physician wants you to follow-up in: 6 months with Nada Boozer NP and 1 year with Dr Allyson Sabal. You will receive a reminder letter in the mail two months in advance. If you don't receive a letter, please call our office to schedule the follow-up appointment.  You can take off your monitor and send it back to the company.

## 2012-12-27 NOTE — Assessment & Plan Note (Signed)
Hypertension well controlled on current medications 

## 2012-12-27 NOTE — Progress Notes (Signed)
12/27/2012 Kristin Wilkerson   1925/08/01  [id number]  Primary Physician GREEN, [name], MD Primary Cardiologist: Runell Gess MD Kristin Wilkerson   HPI:  79 year old female patient  followed by me for hypertension and hyperlipidemia she had echo in 2013 and her last stress test was 2011 which was negative for ischemia. Teflon stress with her husband's medical issues as well. Today she presents with complaints of racing heart rate or heart when she awakens at 4 AM is racing she can go back to sleep easily but it will continue to race. She was on a new medication her arthritis after having her knee injected she thinks that started the tachycardia but it continues over a month after stopping the medication. She is now on Cymbalta to help with her myalgias as well.  After she discussed her racing heart rate she mentioned that she was also having arthritis in her chest as well as left arm arthritis. Her EKG was without acute changes she was tender to touch in left anterior chest and in the left arm where she complained of the pain. She is very anxious that the pain could be cardiac she thinks is arthritis but is worried that it could be her heart. She was seen by Nada Boozer registered nurse partition her on 12/10/12 with complaints of atypical chest pain and heart racing. A Myoview stress test was entirely normal. An event monitor showed sinus rhythm/sinus tachycardia. She admits to being a highly anxious person currently on Cymbalta per her PCP Dr. Chilton Si.    Current Outpatient Prescriptions  Medication Sig Dispense Refill  . amLODipine (NORVASC) 5 MG tablet Take 0.5 tablets (2.5 mg total) by mouth daily.  45 tablet  3  . aspirin EC 81 MG tablet Take 81 mg by mouth daily.      . Calcium Carbonate-Vitamin D (CALCIUM 600+D) 600-400 MG-UNIT per tablet Take one tablet by mouth twice daily      . cholecalciferol (VITAMIN D) 1000 UNITS tablet Take one tablet once daily for vitamin d      .  DULoxetine (CYMBALTA) 30 MG capsule One daily  To help pains and nerves.  90 capsule  3  . latanoprost (XALATAN) 0.005 % ophthalmic solution Instill one drop to both eyes at bedtime to treat glaucoma  7.5 mL  4  . losartan (COZAAR) 100 MG tablet Take 100 mg by mouth daily. Take one tablet once a day for blood pressure      . metoprolol succinate (TOPROL-XL) 50 MG 24 hr tablet Take 25 mg by mouth daily. Take with or immediately following a meal.      . simvastatin (ZOCOR) 20 MG tablet Take 1 tablet (20 mg total) by mouth every evening.  30 tablet  3   No current facility-administered medications for this visit.    No Known Allergies  History   Social History  . Marital Status: Married    Spouse Name: N/A    Number of Children: N/A  . Years of Education: N/A   Occupational History  . Not on file.   Social History Main Topics  . Smoking status: Never Smoker   . Smokeless tobacco: Not on file  . Alcohol Use: No  . Drug Use: No  . Sexual Activity: No   Other Topics Concern  . Not on file   Social History Narrative  . No narrative on file     Review of Systems: General: negative for chills, fever, night sweats or  weight changes.  Cardiovascular: negative for chest pain, dyspnea on exertion, edema, orthopnea, palpitations, paroxysmal nocturnal dyspnea or shortness of breath Dermatological: negative for rash Respiratory: negative for cough or wheezing Urologic: negative for hematuria Abdominal: negative for nausea, vomiting, diarrhea, bright red blood per rectum, melena, or hematemesis Neurologic: negative for visual changes, syncope, or dizziness All other systems reviewed and are otherwise negative except as noted above.    Blood pressure 130/80, pulse 78, height 5' (1.524 m), weight 151 lb (68.493 kg).  General appearance: alert and no distress Neck: no adenopathy, no carotid bruit, no JVD, supple, symmetrical, trachea midline and thyroid not enlarged, symmetric, no  tenderness/mass/nodules Lungs: clear to auscultation bilaterally Heart: regular rate and rhythm, S1, S2 normal, no murmur, click, rub or gallop Extremities: extremities normal, atraumatic, no cyanosis or edema  EKG not performed today  ASSESSMENT AND PLAN:   Chest pain at rest She was seen by Bryna Colander registered nurse practitioner 12/10/12 with atypical chest pain. A Myoview stress test was negative. I do not think this is ischemic pain  Tachycardia She was complaining of palpitations which is a large angled back to weeks ago. An event monitor to showed sinus rhythm/sinus tachycardia. She admits to being a highly anxious.  ESSENTIAL HYPERTENSION, BENIGN Hypertension well controlled on current medications      Runell Gess MD [facility], [facility] 12/27/2012 11:46 AM

## 2013-01-02 ENCOUNTER — Ambulatory Visit (INDEPENDENT_AMBULATORY_CARE_PROVIDER_SITE_OTHER): Payer: Medicare Other | Admitting: Internal Medicine

## 2013-01-02 ENCOUNTER — Encounter: Payer: Self-pay | Admitting: Internal Medicine

## 2013-01-02 VITALS — BP 132/80 | HR 62 | Temp 97.7°F | Resp 18 | Ht 60.0 in | Wt 150.6 lb

## 2013-01-02 DIAGNOSIS — IMO0001 Reserved for inherently not codable concepts without codable children: Secondary | ICD-10-CM

## 2013-01-02 DIAGNOSIS — I10 Essential (primary) hypertension: Secondary | ICD-10-CM

## 2013-01-02 DIAGNOSIS — M549 Dorsalgia, unspecified: Secondary | ICD-10-CM

## 2013-01-02 DIAGNOSIS — R002 Palpitations: Secondary | ICD-10-CM | POA: Diagnosis not present

## 2013-01-02 MED ORDER — PREDNISONE 5 MG PO TABS: ORAL_TABLET | ORAL | Status: DC

## 2013-01-02 MED ORDER — METOPROLOL TARTRATE 25 MG PO TABS: ORAL_TABLET | ORAL | Status: DC

## 2013-01-02 NOTE — Progress Notes (Signed)
Subjective:    Patient ID: Kristin Wilkerson, female    DOB: 11-Oct-1925, 79 y.o.   MRN: [id number]  HPI Headache for a couple of days, but worse pain last night. Took Tylenol and ice cap.  Suffering from sinus. Pain in the cheeks and forehead.  New pains that started about 1 mo ago.Legs burn. Difficult to walk. Hurts both legs from hamstrings to ankles. No pain in the feet. Also hurts in the shoulders. Pain in the neck. Got injection in the right knee from Dr. Darrelyn Hillock about 2 months ago. Did not seem to help much.  Current Outpatient Prescriptions on File Prior to Visit  Medication Sig Dispense Refill  . amLODipine (NORVASC) 5 MG tablet Take 0.5 tablets (2.5 mg total) by mouth daily.  45 tablet  3  . aspirin EC 81 MG tablet Take 81 mg by mouth daily.      . Calcium Carbonate-Vitamin D (CALCIUM 600+D) 600-400 MG-UNIT per tablet Take one tablet by mouth twice daily      . cholecalciferol (VITAMIN D) 1000 UNITS tablet Take one tablet once daily for vitamin d      . DULoxetine (CYMBALTA) 30 MG capsule One daily  To help pains and nerves.  90 capsule  3  . latanoprost (XALATAN) 0.005 % ophthalmic solution Instill one drop to both eyes at bedtime to treat glaucoma  7.5 mL  4  . losartan (COZAAR) 100 MG tablet Take 100 mg by mouth daily. Take one tablet once a day for blood pressure      . metoprolol succinate (TOPROL-XL) 50 MG 24 hr tablet Take 25 mg by mouth daily. Take with or immediately following a meal.      . simvastatin (ZOCOR) 20 MG tablet Take 1 tablet (20 mg total) by mouth every evening.  30 tablet  3   No current facility-administered medications on file prior to visit.    Review of Systems  Constitutional: Negative.   Eyes: Negative.   Respiratory: Negative.   Cardiovascular: Negative.   Gastrointestinal: Negative.   Endocrine: Negative.   Genitourinary: Positive for urgency and frequency.       Nocturia  Musculoskeletal: Positive for myalgias and arthralgias.  Skin: Negative  for rash.  Allergic/Immunologic: Negative.   Neurological:       Tingling sensations in lower extremities  Hematological: Negative.   Psychiatric/Behavioral: Negative.        Objective:   Physical Exam  Constitutional: She is oriented to person, place, and time. She appears well-developed and well-nourished. No distress.  HENT:  Head: Normocephalic.  Right Ear: External ear normal.  Left Ear: External ear normal.  Nose: Nose normal.  Eyes: Conjunctivae and EOM are normal. Pupils are equal, round, and reactive to light.  Neck: Normal range of motion. Neck supple. No JVD present. No tracheal deviation present. No thyromegaly present.  Cardiovascular: Exam reveals no distant heart sounds and no friction rub.   Murmur heard.  Systolic murmur is present with a grade of 2/6  Holosystolic  Pulmonary/Chest: Breath sounds normal. No respiratory distress. She has no wheezes. She has no rales. She exhibits no tenderness.  Abdominal: Soft. Bowel sounds are normal. She exhibits no distension and no mass. There is no tenderness.  Musculoskeletal: Normal range of motion. She exhibits no edema and no tenderness.  Lymphadenopathy:    She has no cervical adenopathy.  Neurological: She is alert and oriented to person, place, and time. She has normal reflexes.  Skin: Skin is warm  and dry. No rash noted. No erythema. No pallor.  Psychiatric: She has a normal mood and affect. Her behavior is normal. Judgment and thought content normal.    Appointment on 10/22/2012  Component Date Value Range Status  . Cholesterol, Total 10/22/2012 180  100 - 199 mg/dL Final  . Triglycerides 10/22/2012 102  0 - 149 mg/dL Final  . HDL 09/81/1914 54  >39 mg/dL Final   Comment: According to ATP-III Guidelines, HDL-C >59 mg/dL is considered a                          negative risk factor for CHD.  [name] VLDL Cholesterol Cal 10/22/2012 20  5 - 40 mg/dL Final  . LDL Calculated 10/22/2012 782* 0 - 99 mg/dL Final  . Chol/HDL  Ratio 10/22/2012 3.3  0.0 - 4.4 ratio units Final     Appointment on 10/22/2012  Component Date Value Range Status  . Cholesterol, Total 10/22/2012 180  100 - 199 mg/dL Final  . Triglycerides 10/22/2012 102  0 - 149 mg/dL Final  . HDL 95/62/1308 54  >39 mg/dL Final   Comment: According to ATP-III Guidelines, HDL-C >59 mg/dL is considered a                          negative risk factor for CHD.  [name] VLDL Cholesterol Cal 10/22/2012 20  5 - 40 mg/dL Final  . LDL Calculated 10/22/2012 657* 0 - 99 mg/dL Final  . Chol/HDL Ratio 10/22/2012 3.3  0.0 - 4.4 ratio units Final       Assessment & Plan:  Myalgia and myositis, unspecified - Plan: Sedimentation Rate, Rheumatoid Factor, ANA, Uric Acid, CK, predniSONE (DELTASONE) 5 MG tablet dose pack  BACK PAIN, CHRONIC: unchanged  Essential hypertension, benign: controlled  Palpitations - Plan: metoprolol tartrate (LOPRESSOR) 25 MG tablet bid

## 2013-01-02 NOTE — Patient Instructions (Signed)
Try prednisone to help pains.

## 2013-01-03 LAB — SEDIMENTATION RATE: Sed Rate: 4 mm/hr (ref 0–40)

## 2013-01-03 LAB — CK: Total CK: 100 U/L (ref 24–173)

## 2013-01-07 ENCOUNTER — Ambulatory Visit: Payer: Medicare Other | Admitting: Internal Medicine

## 2013-01-09 ENCOUNTER — Encounter: Payer: Self-pay | Admitting: Internal Medicine

## 2013-01-09 ENCOUNTER — Ambulatory Visit (INDEPENDENT_AMBULATORY_CARE_PROVIDER_SITE_OTHER): Payer: Medicare Other | Admitting: Internal Medicine

## 2013-01-09 ENCOUNTER — Other Ambulatory Visit: Payer: Self-pay | Admitting: *Deleted

## 2013-01-09 VITALS — BP 122/72 | HR 61 | Temp 97.7°F | Wt 150.8 lb

## 2013-01-09 DIAGNOSIS — I10 Essential (primary) hypertension: Secondary | ICD-10-CM

## 2013-01-09 DIAGNOSIS — R000 Tachycardia, unspecified: Secondary | ICD-10-CM

## 2013-01-09 DIAGNOSIS — IMO0001 Reserved for inherently not codable concepts without codable children: Secondary | ICD-10-CM | POA: Diagnosis not present

## 2013-01-09 DIAGNOSIS — R5381 Other malaise: Secondary | ICD-10-CM | POA: Diagnosis not present

## 2013-01-09 NOTE — Progress Notes (Signed)
Subjective:    Patient ID: Kristin Wilkerson, female    DOB: 03-15-1926, 79 y.o.   MRN: [id number]  HPI  Pain improved on prednisone. She had several days of feeling weak. Felt confused yesterday. Today she is better. Experienced ecchymoses on the medial left leg since on prednisone. last day was yesterday.  Palpitations improved. Had Holter monitor done at Dr. Hazle Coca office.  Still has pain in the knees. Has seen Dr. Darrelyn Hillock in the past.  Sinus problem is improved. Nose runs in the morning. Pain is better.  Current Outpatient Prescriptions on File Prior to Visit  Medication Sig Dispense Refill  . amLODipine (NORVASC) 5 MG tablet Take 0.5 tablets (2.5 mg total) by mouth daily.  45 tablet  3  . aspirin EC 81 MG tablet Take 81 mg by mouth daily.      . Calcium Carbonate-Vitamin D (CALCIUM 600+D) 600-400 MG-UNIT per tablet Take one tablet by mouth twice daily      . cholecalciferol (VITAMIN D) 1000 UNITS tablet Take one tablet once daily for vitamin d      . DULoxetine (CYMBALTA) 30 MG capsule One daily  To help pains and nerves.  90 capsule  3  . latanoprost (XALATAN) 0.005 % ophthalmic solution Instill one drop to both eyes at bedtime to treat glaucoma  7.5 mL  4  . losartan (COZAAR) 100 MG tablet Take 100 mg by mouth daily. Take one tablet once a day for blood pressure      . metoprolol tartrate (LOPRESSOR) 25 MG tablet Take one tablet twice daily to reduce palpitations  180 tablet  3  . simvastatin (ZOCOR) 20 MG tablet Take 1 tablet (20 mg total) by mouth every evening.  30 tablet  3   No current facility-administered medications on file prior to visit.    Review of Systems  Constitutional: Negative.   Eyes: Negative.   Respiratory: Negative.   Cardiovascular: Negative.   Gastrointestinal: Negative.   Endocrine: Negative.   Genitourinary: Positive for urgency and frequency.       Nocturia  Musculoskeletal: Positive for myalgias and arthralgias.  Skin: Negative for rash.   Allergic/Immunologic: Negative.   Neurological:       Tingling sensations in lower extremities  Hematological: Negative.   Psychiatric/Behavioral: Negative.        Objective:BP 122/72  Pulse 61  Temp(Src) 97.7 F (36.5 C) (Oral)  Wt 150 lb 12.8 oz (68.402 kg)  BMI 29.45 kg/m2  SpO2 99%    Physical Exam  Constitutional: She is oriented to person, place, and time. She appears well-developed and well-nourished. No distress.  HENT:  Head: Normocephalic.  Right Ear: External ear normal.  Left Ear: External ear normal.  Nose: Nose normal.  Eyes: Conjunctivae and EOM are normal. Pupils are equal, round, and reactive to light.  Neck: Normal range of motion. Neck supple. No JVD present. No tracheal deviation present. No thyromegaly present.  Cardiovascular: Exam reveals no distant heart sounds and no friction rub.   Murmur heard.  Systolic murmur is present with a grade of 2/6  Holosystolic  Pulmonary/Chest: Breath sounds normal. No respiratory distress. She has no wheezes. She has no rales. She exhibits no tenderness.  Abdominal: Soft. Bowel sounds are normal. She exhibits no distension and no mass. There is no tenderness.  Musculoskeletal: Normal range of motion. She exhibits no edema and no tenderness.  Lymphadenopathy:    She has no cervical adenopathy.  Neurological: She is alert and oriented to  person, place, and time. She has normal reflexes.  Skin: Skin is warm and dry. No rash noted. No erythema. No pallor.  Psychiatric: She has a normal mood and affect. Her behavior is normal. Judgment and thought content normal.     Office Visit on 01/02/2013  Component Date Value Range Status  . Sed Rate 01/02/2013 4  0 - 40 mm/hr Final  . Rheumatoid Factor 01/02/2013 15.1* 0.0 - 13.9 IU/mL Final  . ANA 01/02/2013 Negative  Negative Final  . Uric Acid 01/02/2013 3.1  2.5 - 7.1 mg/dL Final              Therapeutic target for gout patients: <6.0  . Total CK 01/02/2013 100  24 - 173  U/L Final  Appointment on 10/22/2012  Component Date Value Range Status  . Cholesterol, Total 10/22/2012 180  100 - 199 mg/dL Final  . Triglycerides 10/22/2012 102  0 - 149 mg/dL Final  . HDL 09/81/1914 54  >39 mg/dL Final   Comment: According to ATP-III Guidelines, HDL-C >59 mg/dL is considered a                          negative risk factor for CHD.  [name] VLDL Cholesterol Cal 10/22/2012 20  5 - 40 mg/dL Final  . LDL Calculated 10/22/2012 782* 0 - 99 mg/dL Final  . Chol/HDL Ratio 10/22/2012 3.3  0.0 - 4.4 ratio units Final        Assessment & Plan:  Myalgia and myositis, unspecified: improved  Other malaise and fatigue: improved  Essential hypertension, benign: stable  Tachycardia: improved

## 2013-01-09 NOTE — Patient Instructions (Signed)
Continue current medications. Cancel appointment in Oct 2014. See me in November.

## 2013-01-10 DIAGNOSIS — R002 Palpitations: Secondary | ICD-10-CM

## 2013-01-18 ENCOUNTER — Encounter: Payer: Self-pay | Admitting: Cardiovascular Disease

## 2013-01-23 ENCOUNTER — Ambulatory Visit: Payer: Medicare Other | Admitting: Internal Medicine

## 2013-01-29 DIAGNOSIS — M1710 Unilateral primary osteoarthritis, unspecified knee: Secondary | ICD-10-CM | POA: Diagnosis not present

## 2013-01-29 DIAGNOSIS — IMO0002 Reserved for concepts with insufficient information to code with codable children: Secondary | ICD-10-CM | POA: Diagnosis not present

## 2013-02-21 DIAGNOSIS — R3129 Other microscopic hematuria: Secondary | ICD-10-CM | POA: Diagnosis not present

## 2013-02-28 ENCOUNTER — Inpatient Hospital Stay (HOSPITAL_COMMUNITY)
Admission: EM | Admit: 2013-02-28 | Discharge: 2013-03-02 | DRG: 065 | Disposition: A | Discharge status: Home Health | Payer: Medicare Other | Attending: Internal Medicine | Admitting: Internal Medicine

## 2013-02-28 ENCOUNTER — Inpatient Hospital Stay (HOSPITAL_COMMUNITY): Payer: Medicare Other

## 2013-02-28 ENCOUNTER — Encounter (HOSPITAL_COMMUNITY): Payer: Self-pay | Admitting: Emergency Medicine

## 2013-02-28 ENCOUNTER — Emergency Department (HOSPITAL_COMMUNITY): Payer: Medicare Other

## 2013-02-28 DIAGNOSIS — I369 Nonrheumatic tricuspid valve disorder, unspecified: Secondary | ICD-10-CM | POA: Diagnosis not present

## 2013-02-28 DIAGNOSIS — H538 Other visual disturbances: Secondary | ICD-10-CM | POA: Diagnosis not present

## 2013-02-28 DIAGNOSIS — I10 Essential (primary) hypertension: Secondary | ICD-10-CM

## 2013-02-28 DIAGNOSIS — F3289 Other specified depressive episodes: Secondary | ICD-10-CM | POA: Diagnosis present

## 2013-02-28 DIAGNOSIS — R29810 Facial weakness: Secondary | ICD-10-CM | POA: Diagnosis present

## 2013-02-28 DIAGNOSIS — I639 Cerebral infarction, unspecified: Secondary | ICD-10-CM

## 2013-02-28 DIAGNOSIS — I2789 Other specified pulmonary heart diseases: Secondary | ICD-10-CM | POA: Diagnosis present

## 2013-02-28 DIAGNOSIS — R209 Unspecified disturbances of skin sensation: Secondary | ICD-10-CM | POA: Diagnosis not present

## 2013-02-28 DIAGNOSIS — Z79899 Other long term (current) drug therapy: Secondary | ICD-10-CM | POA: Diagnosis not present

## 2013-02-28 DIAGNOSIS — G459 Transient cerebral ischemic attack, unspecified: Secondary | ICD-10-CM | POA: Diagnosis not present

## 2013-02-28 DIAGNOSIS — M810 Age-related osteoporosis without current pathological fracture: Secondary | ICD-10-CM | POA: Diagnosis present

## 2013-02-28 DIAGNOSIS — F419 Anxiety disorder, unspecified: Secondary | ICD-10-CM

## 2013-02-28 DIAGNOSIS — F329 Major depressive disorder, single episode, unspecified: Secondary | ICD-10-CM | POA: Diagnosis present

## 2013-02-28 DIAGNOSIS — E785 Hyperlipidemia, unspecified: Secondary | ICD-10-CM | POA: Diagnosis present

## 2013-02-28 DIAGNOSIS — I272 Pulmonary hypertension, unspecified: Secondary | ICD-10-CM

## 2013-02-28 DIAGNOSIS — Z823 Family history of stroke: Secondary | ICD-10-CM | POA: Diagnosis not present

## 2013-02-28 DIAGNOSIS — F411 Generalized anxiety disorder: Secondary | ICD-10-CM | POA: Diagnosis present

## 2013-02-28 DIAGNOSIS — I6350 Cerebral infarction due to unspecified occlusion or stenosis of unspecified cerebral artery: Secondary | ICD-10-CM | POA: Diagnosis not present

## 2013-02-28 DIAGNOSIS — Z7982 Long term (current) use of aspirin: Secondary | ICD-10-CM | POA: Diagnosis not present

## 2013-02-28 DIAGNOSIS — I509 Heart failure, unspecified: Secondary | ICD-10-CM | POA: Diagnosis present

## 2013-02-28 DIAGNOSIS — I5030 Unspecified diastolic (congestive) heart failure: Secondary | ICD-10-CM | POA: Diagnosis not present

## 2013-02-28 LAB — APTT: aPTT: 24 seconds (ref 24–37)

## 2013-02-28 LAB — DIFFERENTIAL
Basophils Absolute: 0.1 10*3/uL (ref 0.0–0.1)
Basophils Relative: 1 % (ref 0–1)
Neutro Abs: 3.6 10*3/uL (ref 1.7–7.7)
Neutrophils Relative %: 54 % (ref 43–77)

## 2013-02-28 LAB — POCT I-STAT TROPONIN I: Troponin i, poc: 0 ng/mL (ref 0.00–0.08)

## 2013-02-28 LAB — POCT I-STAT, CHEM 8
Chloride: 103 mEq/L (ref 96–112)
Creatinine, Ser: 1 mg/dL (ref 0.50–1.10)
Glucose, Bld: 98 mg/dL (ref 70–99)
Hemoglobin: 15 g/dL (ref 12.0–15.0)
Potassium: 3.8 mEq/L (ref 3.5–5.1)
Sodium: 141 mEq/L (ref 135–145)

## 2013-02-28 LAB — CBC
Hemoglobin: 14 g/dL (ref 12.0–15.0)
MCHC: 33.8 g/dL (ref 30.0–36.0)
Platelets: 202 10*3/uL (ref 150–400)
RDW: 14.3 % (ref 11.5–15.5)

## 2013-02-28 LAB — URINALYSIS, ROUTINE W REFLEX MICROSCOPIC
Hgb urine dipstick: NEGATIVE
Ketones, ur: NEGATIVE mg/dL
Protein, ur: NEGATIVE mg/dL
Urobilinogen, UA: 0.2 mg/dL (ref 0.0–1.0)

## 2013-02-28 LAB — COMPREHENSIVE METABOLIC PANEL
AST: 18 U/L (ref 0–37)
Albumin: 4 g/dL (ref 3.5–5.2)
Alkaline Phosphatase: 69 U/L (ref 39–117)
Chloride: 102 mEq/L (ref 96–112)
Potassium: 3.8 mEq/L (ref 3.5–5.1)
Sodium: 139 mEq/L (ref 135–145)
Total Bilirubin: 0.5 mg/dL (ref 0.3–1.2)
Total Protein: 7 g/dL (ref 6.0–8.3)

## 2013-02-28 LAB — RAPID URINE DRUG SCREEN, HOSP PERFORMED
Amphetamines: NOT DETECTED
Benzodiazepines: NOT DETECTED
Opiates: NOT DETECTED
Tetrahydrocannabinol: NOT DETECTED

## 2013-02-28 LAB — GLUCOSE, CAPILLARY: Glucose-Capillary: 94 mg/dL (ref 70–99)

## 2013-02-28 LAB — URINE MICROSCOPIC-ADD ON

## 2013-02-28 LAB — ETHANOL: Alcohol, Ethyl (B): <11 mg/dL (ref 0–11)

## 2013-02-28 LAB — PROTIME-INR
INR: 0.91 (ref 0.00–1.49)
Prothrombin Time: 12.1 seconds (ref 11.6–15.2)

## 2013-02-28 MED ORDER — CLOPIDOGREL BISULFATE 75 MG PO TABS
75.0000 mg | ORAL_TABLET | Freq: Every day | ORAL | Status: DC
  Administered 2013-03-01 – 2013-03-02 (×2): 75 mg via ORAL
  Filled 2013-02-28 (×3): qty 1

## 2013-02-28 MED ORDER — LATANOPROST 0.005 % OP SOLN
1.0000 [drp] | Freq: Every day | OPHTHALMIC | Status: DC
  Administered 2013-03-01 (×2): 1 [drp] via OPHTHALMIC
  Filled 2013-02-28: qty 2.5

## 2013-02-28 MED ORDER — DULOXETINE HCL 30 MG PO CPEP
30.0000 mg | ORAL_CAPSULE | Freq: Every day | ORAL | Status: DC
  Administered 2013-03-01 – 2013-03-02 (×2): 30 mg via ORAL
  Filled 2013-02-28 (×2): qty 1

## 2013-02-28 MED ORDER — HEPARIN SODIUM (PORCINE) 5000 UNIT/ML IJ SOLN
5000.0000 [IU] | Freq: Three times a day (TID) | INTRAMUSCULAR | Status: DC
  Administered 2013-03-01 – 2013-03-02 (×5): 5000 [IU] via SUBCUTANEOUS
  Filled 2013-02-28 (×8): qty 1

## 2013-02-28 MED ORDER — ASPIRIN EC 81 MG PO TBEC
81.0000 mg | DELAYED_RELEASE_TABLET | Freq: Every day | ORAL | Status: DC
  Filled 2013-02-28: qty 1

## 2013-02-28 MED ORDER — SIMVASTATIN 20 MG PO TABS
20.0000 mg | ORAL_TABLET | Freq: Every evening | ORAL | Status: DC
  Administered 2013-03-01: 20 mg via ORAL
  Filled 2013-02-28 (×2): qty 1

## 2013-02-28 NOTE — ED Notes (Signed)
Dr Sheldon at bedside  

## 2013-02-28 NOTE — ED Notes (Signed)
Dr Stewart at bedside.

## 2013-02-28 NOTE — Consult Note (Signed)
Referring Physician: Dr. Bernette Mayers    Chief Complaint: Facial droop and numbness as well as blurred vision on the left.  HPI: Kristin Wilkerson is an 79 y.o. female history of hypertension and hyperlipidemia presenting with acute onset of blurred vision and numbness involving left side of her face as well as slight facial droop. Onset was at 5:30 PM today. Has no previous history of stroke nor TIA. Patient's been on aspirin 81 mg per day. CT scan of the head showed no acute intracranial abnormality. Deficits resolved essentially during the emergency room stay, except for minimal residual blurring of vision. NIH stroke score was 0.  LSN: 5:30 PM on 02/28/2013 tPA Given: No: Rapidly resolving deficits MRankin: 0  Past Medical History  Diagnosis Date  . Hypertension   . Abnormality of gait   . Disturbance of skin sensation   . Urinary frequency   . Tachycardia, unspecified   . Internal hemorrhoids without mention of complication   . Unspecified pruritic disorder   . Insomnia, unspecified   . Synovial cyst of popliteal space   . Spinal stenosis, other region   . Sciatica   . Lumbago   . Unspecified vitamin D deficiency   . Anxiety   . Hyperlipidemia   . Abnormality of gait   . Unspecified sinusitis (chronic)   . Unspecified pruritic disorder   . Unspecified viral infection, in conditions classified elsewhere and of unspecified site   . Pain in joint, lower leg   . Depressive disorder, not elsewhere classified   . Unspecified glaucoma(365.9)   . Reflux esophagitis   . Diverticulosis of colon (without mention of hemorrhage)   . Pain in joint, site unspecified   . Myalgia and myositis, unspecified   . Osteoporosis, unspecified   . Other malaise and fatigue   . Cervicalgia   . Senile osteoporosis   . Benign neoplasm of colon   . H/O echocardiogram 09/01/11    EF >55%, mild MR, RV systolic pressure elevated at 96-29 mmHg, mod TR  . Normal cardiac stress test 11/16/09    low risk scan, EF   77%    Family History  Problem Relation Age of Onset  . Cancer Sister   . Stroke Mother   . Stroke Father      Medications: I have reviewed the patient's current medications.  ROS: History obtained from the patient  General ROS: negative for - chills, fatigue, fever, night sweats, weight gain or weight loss Psychological ROS: negative for - behavioral disorder, hallucinations, memory difficulties, mood swings or suicidal ideation Ophthalmic ROS: negative for - blurry vision, double vision, eye pain or loss of vision ENT ROS: negative for - epistaxis, nasal discharge, oral lesions, sore throat, tinnitus or vertigo Allergy and Immunology ROS: negative for - hives or itchy/watery eyes Hematological and Lymphatic ROS: negative for - bleeding problems, bruising or swollen lymph nodes Endocrine ROS: negative for - galactorrhea, hair pattern changes, polydipsia/polyuria or temperature intolerance Respiratory ROS: negative for - cough, hemoptysis, shortness of breath or wheezing Cardiovascular ROS: negative for - chest pain, dyspnea on exertion, edema or irregular heartbeat Gastrointestinal ROS: negative for - abdominal pain, diarrhea, hematemesis, nausea/vomiting or stool incontinence Genito-Urinary ROS: negative for - dysuria, hematuria, incontinence or urinary frequency/urgency Musculoskeletal ROS: negative for - joint swelling or muscular weakness Neurological ROS: as noted in HPI Dermatological ROS: negative for rash and skin lesion changes  Physical Examination: Blood pressure 176/80, pulse 85, resp. rate 30, SpO2 99.00%.  Neurologic Examination: Mental  Status: Alert, oriented, thought content appropriate.  Speech fluent without evidence of aphasia. Able to follow commands without difficulty. Cranial Nerves: II-Visual fields were normal. III/IV/VI-Pupils were equal and reacted. Extraocular movements were full and conjugate.    V/VII-no facial numbness and no facial  weakness. VIII-normal. X-normal speech. Motor: 5/5 bilaterally with normal tone and bulk Sensory: Normal throughout. Deep Tendon Reflexes: 2+ and symmetric. Plantars: Mute bilaterally Cerebellar: Normal finger-to-nose testing. Carotid auscultation: Normal  Ct Head Wo Contrast  02/28/2013   CLINICAL DATA:  Left facial numbness and left blurred vision.  EXAM: CT HEAD WITHOUT CONTRAST  TECHNIQUE: Contiguous axial images were obtained from the base of the skull through the vertex without intravenous contrast.  COMPARISON:  03/20/2012.  FINDINGS: Stable mildly enlarged ventricles and subarachnoid spaces. No intracranial hemorrhage, mass lesion or CT evidence of acute infarction. Unremarkable bones. Small right maxillary sinus retention cyst. Stable 1.3 cm oval mass with a small amount of calcification in the right parietal scalp, most likely representing a sebaceous cyst.  IMPRESSION: No acute abnormality.  Stable mild atrophy.  These results were called by telephone at the time of interpretation on 02/28/2013 at 6:44 PM to Dr. [name], who verbally acknowledged these results.   Electronically Signed   By: Gordan Payment M.D.   On: 02/28/2013 18:45    Assessment: 79 y.o. female with hypertension and hyperlipidemia presenting with probable right posterior circulation TIA. Small residual ischemic infarction cannot be ruled out at this point, however.  Stroke Risk Factors - hyperlipidemia and hypertension  Plan: 1. HgbA1c, fasting lipid panel 2. MRI, MRA  of the brain without contrast 3. PT consult, OT consult, Speech consult 4. Echocardiogram 5. Carotid dopplers 6. Prophylactic therapy-Antiplatelet med: Plavix 75 mg per day 7. Risk factor modification 8. Telemetry monitoring  C.R. [name], MD [facility]  02/28/2013, 7:11 PM

## 2013-02-28 NOTE — ED Notes (Signed)
Rapid Response RN at bedside.  

## 2013-02-28 NOTE — ED Notes (Signed)
Phlebotomy at bedside.

## 2013-02-28 NOTE — ED Provider Notes (Signed)
CSN: [id number]     Arrival date & time 02/28/13  1827 History   First MD Initiated Contact with Patient 02/28/13 1836     Chief Complaint  Patient presents with  . Code Stroke   (Consider location/radiation/quality/duration/timing/severity/associated sxs/prior Treatment) HPI Pt with multiple medical problems but no prior history of TIA/Stroke was brought to the ED after onset of L sided facial numbness blurry vision and ?facial droop at 1745 today. Code Stroke activated on arrival and patient taken to CT prior to my eval. She apparently has had resolution of her symptoms since then and is back to baseline.   Past Medical History  Diagnosis Date  . Hypertension   . Abnormality of gait   . Disturbance of skin sensation   . Urinary frequency   . Tachycardia, unspecified   . Internal hemorrhoids without mention of complication   . Unspecified pruritic disorder   . Insomnia, unspecified   . Synovial cyst of popliteal space   . Spinal stenosis, other region   . Sciatica   . Lumbago   . Unspecified vitamin D deficiency   . Anxiety   . Hyperlipidemia   . Abnormality of gait   . Unspecified sinusitis (chronic)   . Unspecified pruritic disorder   . Unspecified viral infection, in conditions classified elsewhere and of unspecified site   . Pain in joint, lower leg   . Depressive disorder, not elsewhere classified   . Unspecified glaucoma(365.9)   . Reflux esophagitis   . Diverticulosis of colon (without mention of hemorrhage)   . Pain in joint, site unspecified   . Myalgia and myositis, unspecified   . Osteoporosis, unspecified   . Other malaise and fatigue   . Cervicalgia   . Senile osteoporosis   . Benign neoplasm of colon   . H/O echocardiogram 09/01/11    EF >55%, mild MR, RV systolic pressure elevated at 40-98 mmHg, mod TR  . Normal cardiac stress test 11/16/09    low risk scan, EF  77%   Past Surgical History  Procedure Laterality Date  . Lumbar laminectomy  12/04/2012   Complete decompressive L3-4 and L4-5--Dr. Darrelyn Hillock  . Abdominal hysterectomy    . Cholecystectomy     Family History  Problem Relation Age of Onset  . Cancer Sister   . Stroke Mother   . Stroke Father    History  Substance Use Topics  . Smoking status: Never Smoker   . Smokeless tobacco: Not on file  . Alcohol Use: No   OB History   Grav Para Term Preterm Abortions TAB SAB Ect Mult Living                 Review of Systems All other systems reviewed and are negative except as noted in HPI.   Allergies  Review of patient's allergies indicates no known allergies.  Home Medications   Current Outpatient Rx  Name  Route  Sig  Dispense  Refill  . amLODipine (NORVASC) 5 MG tablet   Oral   Take 0.5 tablets (2.5 mg total) by mouth daily.   45 tablet   3   . aspirin EC 81 MG tablet   Oral   Take 81 mg by mouth daily.         . Calcium Carbonate-Vitamin D (CALCIUM 600+D) 600-400 MG-UNIT per tablet      Take one tablet by mouth twice daily         . cholecalciferol (VITAMIN D) 1000 UNITS tablet  Take one tablet once daily for vitamin d         . DULoxetine (CYMBALTA) 30 MG capsule      One daily  To help pains and nerves.   90 capsule   3   . latanoprost (XALATAN) 0.005 % ophthalmic solution      Instill one drop to both eyes at bedtime to treat glaucoma   7.5 mL   4   . losartan (COZAAR) 100 MG tablet   Oral   Take 100 mg by mouth daily. Take one tablet once a day for blood pressure         . metoprolol tartrate (LOPRESSOR) 25 MG tablet      Take one tablet twice daily to reduce palpitations   180 tablet   3   . simvastatin (ZOCOR) 20 MG tablet   Oral   Take 1 tablet (20 mg total) by mouth every evening.   30 tablet   3    BP 157/74  Pulse 73  Resp 17  SpO2 96% Physical Exam  Nursing note and vitals reviewed. Constitutional: She is oriented to person, place, and time. She appears well-developed and well-nourished.  HENT:  Head:  Normocephalic and atraumatic.  Eyes: EOM are normal. Pupils are equal, round, and reactive to light.  Neck: Normal range of motion. Neck supple.  Cardiovascular: Normal rate, normal heart sounds and intact distal pulses.   Pulmonary/Chest: Effort normal and breath sounds normal.  Abdominal: Bowel sounds are normal. She exhibits no distension. There is no tenderness.  Musculoskeletal: Normal range of motion. She exhibits no edema and no tenderness.  Neurological: She is alert and oriented to person, place, and time. She has normal strength. No cranial nerve deficit or sensory deficit.  For full NIHSS please see Neuro Team assessment.   Skin: Skin is warm and dry. No rash noted.  Psychiatric: She has a normal mood and affect.    ED Course  Procedures (including critical care time) Labs Review Labs Reviewed  COMPREHENSIVE METABOLIC PANEL - Abnormal; Notable for the following:    GFR calc non Af Amer 62 (*)    GFR calc Af Amer 72 (*)    All other components within normal limits  ETHANOL  PROTIME-INR  APTT  CBC  DIFFERENTIAL  GLUCOSE, CAPILLARY  TROPONIN I  URINE RAPID DRUG SCREEN (HOSP PERFORMED)  URINALYSIS, ROUTINE W REFLEX MICROSCOPIC  POCT I-STAT, CHEM 8  POCT I-STAT TROPONIN I   Imaging Review Ct Head Wo Contrast  02/28/2013   CLINICAL DATA:  Left facial numbness and left blurred vision.  EXAM: CT HEAD WITHOUT CONTRAST  TECHNIQUE: Contiguous axial images were obtained from the base of the skull through the vertex without intravenous contrast.  COMPARISON:  03/20/2012.  FINDINGS: Stable mildly enlarged ventricles and subarachnoid spaces. No intracranial hemorrhage, mass lesion or CT evidence of acute infarction. Unremarkable bones. Small right maxillary sinus retention cyst. Stable 1.3 cm oval mass with a small amount of calcification in the right parietal scalp, most likely representing a sebaceous cyst.  IMPRESSION: No acute abnormality.  Stable mild atrophy.  These results were  called by telephone at the time of interpretation on 02/28/2013 at 6:44 PM to Dr. [name], who verbally acknowledged these results.   Electronically Signed   By: Gordan Payment M.D.   On: 02/28/2013 18:45    EKG Interpretation   None       MDM   1. TIA (transient ischemic attack)   2.  Essential hypertension, benign    Pt is back to baseline now. Seen by Neuro who recommends admission for TIA workup. Pt and family at bedside amenable to this plan.     Delbert Darley B. Bernette Mayers, MD 02/28/13 [phone]

## 2013-02-28 NOTE — ED Notes (Signed)
Attempted report 

## 2013-02-28 NOTE — ED Notes (Signed)
Presents with left sided facial numbness began at 17:30 this evening, associated with blurred vision. No arm drift, no weakness, denies numbness and tingling in extremitities. Pt alert and oreinted, answers all questions appropriately

## 2013-02-28 NOTE — ED Notes (Addendum)
Per Dr. Bernette Mayers, may sit patient up for swallow screen. Pt laid flat after screening. Dr. Julian Reil at bedside. Pt c/o slight headache to left side of head.

## 2013-02-28 NOTE — H&P (Signed)
[facility] History and Physical  SABAH ZUCCO ZOX:[id number] DOB: Sep 29, 1925 DOA: 02/28/2013  Referring physician: ED PCP: Kimber Relic, MD  Chief Complaint: Facial droop, blurred vision  HPI: Kristin Wilkerson is a 79 y.o. female who presents to the ED with acute onset of blurred L visual field, left facial numbness, and L facial droop.  Symptoms onset at 5:30pm today, no PMH of stroke nor TIA.  Called code stroke, CT head negative, but facial droop resolved and L visual field vision improved while patient was in CT scan so no TPA given.  Review of Systems: 12 systems reviewed and otherwise negative.  Past Medical History  Diagnosis Date  . Hypertension   . Abnormality of gait   . Disturbance of skin sensation   . Urinary frequency   . Tachycardia, unspecified   . Internal hemorrhoids without mention of complication   . Unspecified pruritic disorder   . Insomnia, unspecified   . Synovial cyst of popliteal space   . Spinal stenosis, other region   . Sciatica   . Lumbago   . Unspecified vitamin D deficiency   . Anxiety   . Hyperlipidemia   . Abnormality of gait   . Unspecified sinusitis (chronic)   . Unspecified pruritic disorder   . Unspecified viral infection, in conditions classified elsewhere and of unspecified site   . Pain in joint, lower leg   . Depressive disorder, not elsewhere classified   . Unspecified glaucoma(365.9)   . Reflux esophagitis   . Diverticulosis of colon (without mention of hemorrhage)   . Pain in joint, site unspecified   . Myalgia and myositis, unspecified   . Osteoporosis, unspecified   . Other malaise and fatigue   . Cervicalgia   . Senile osteoporosis   . Benign neoplasm of colon   . H/O echocardiogram 09/01/11    EF >55%, mild MR, RV systolic pressure elevated at 81-19 mmHg, mod TR  . Normal cardiac stress test 11/16/09    low risk scan, EF  77%   Past Surgical History  Procedure Laterality Date  . Lumbar laminectomy  12/04/2012   Complete decompressive L3-4 and L4-5--Dr. Darrelyn Hillock  . Abdominal hysterectomy    . Cholecystectomy     Social History:  reports that she has never smoked. She does not have any smokeless tobacco history on file. She reports that she does not drink alcohol or use illicit drugs.   No Known Allergies  Family History  Problem Relation Age of Onset  . Cancer Sister   . Stroke Mother   . Stroke Father      Prior to Admission medications   Medication Sig Start Date End Date Taking? Authorizing Provider  amLODipine (NORVASC) 5 MG tablet Take 0.5 tablets (2.5 mg total) by mouth daily. 12/20/12  Yes Tiffany L Reed, DO  aspirin EC 81 MG tablet Take 81 mg by mouth daily.   Yes Historical Provider, MD  Calcium Carbonate-Vitamin D (CALCIUM 600+D) 600-400 MG-UNIT per tablet Take one tablet by mouth twice daily   Yes Historical Provider, MD  cholecalciferol (VITAMIN D) 1000 UNITS tablet Take one tablet once daily for vitamin d   Yes Historical Provider, MD  DULoxetine (CYMBALTA) 30 MG capsule One daily  To help pains and nerves. 10/24/12  Yes Kimber Relic, MD  latanoprost (XALATAN) 0.005 % ophthalmic solution Instill one drop to both eyes at bedtime to treat glaucoma 09/18/12  Yes Kimber Relic, MD  losartan (COZAAR) 100 MG tablet  Take 100 mg by mouth daily. Take one tablet once a day for blood pressure   Yes Historical Provider, MD  metoprolol tartrate (LOPRESSOR) 25 MG tablet Take one tablet twice daily to reduce palpitations 01/02/13  Yes Kimber Relic, MD  simvastatin (ZOCOR) 20 MG tablet Take 1 tablet (20 mg total) by mouth every evening. 10/31/12   Kimber Relic, MD   Physical Exam: Filed Vitals:   02/28/13 2045  BP: 158/94  Pulse: 73  Temp:   Resp: 16    General:  NAD, resting comfortably in bed Eyes: PEERLA EOMI ENT: mucous membranes moist Neck: supple w/o JVD Cardiovascular: RRR w/o MRG Respiratory: CTA B Abdomen: soft, nt, nd, bs+ Skin: no rash nor lesion Musculoskeletal: MAE, full  ROM all 4 extremities Psychiatric: normal tone and affect Neurologic: AAOx3, no facial droop, does have L visual field blurry vision  Labs on Admission:  Basic Metabolic Panel:  Recent Labs Lab 02/28/13 1832 02/28/13 1849  NA 139 141  K 3.8 3.8  CL 102 103  CO2 27  --   GLUCOSE 96 98  BUN 20 20  CREATININE 0.84 1.00  CALCIUM 9.2  --    Liver Function Tests:  Recent Labs Lab 02/28/13 1832  AST 18  ALT 18  ALKPHOS 69  BILITOT 0.5  PROT 7.0  ALBUMIN 4.0   No results found for this basename: LIPASE, AMYLASE,  in the last 168 hours No results found for this basename: AMMONIA,  in the last 168 hours CBC:  Recent Labs Lab 02/28/13 1832 02/28/13 1849  WBC 6.8  --   NEUTROABS 3.6  --   HGB 14.0 15.0  HCT 41.4 44.0  MCV 95.2  --   PLT 202  --    Cardiac Enzymes:  Recent Labs Lab 02/28/13 1832  TROPONINI <0.30    BNP (last 3 results) No results found for this basename: PROBNP,  in the last 8760 hours CBG:  Recent Labs Lab 02/28/13 1842  GLUCAP 94    Radiological Exams on Admission: Ct Head Wo Contrast  02/28/2013   CLINICAL DATA:  Left facial numbness and left blurred vision.  EXAM: CT HEAD WITHOUT CONTRAST  TECHNIQUE: Contiguous axial images were obtained from the base of the skull through the vertex without intravenous contrast.  COMPARISON:  03/20/2012.  FINDINGS: Stable mildly enlarged ventricles and subarachnoid spaces. No intracranial hemorrhage, mass lesion or CT evidence of acute infarction. Unremarkable bones. Small right maxillary sinus retention cyst. Stable 1.3 cm oval mass with a small amount of calcification in the right parietal scalp, most likely representing a sebaceous cyst.  IMPRESSION: No acute abnormality.  Stable mild atrophy.  These results were called by telephone at the time of interpretation on 02/28/2013 at 6:44 PM to Dr. [name], who verbally acknowledged these results.   Electronically Signed   By: Gordan Payment M.D.   On: 02/28/2013  18:45    EKG: Independently reviewed.  Assessment/Plan Principal Problem:   TIA (transient ischemic attack)   1. TIA / CVA - stroke work up ordered, tele bed, plavix ordered, PT/OT/SLP. 2. HTN - allowing permissive HTN in setting of acute stroke, treat if SBP > 220.    Code Status: Full Code (must indicate code status--if unknown or must be presumed, indicate so) Family Communication: Family at bedside (indicate person spoken with, if applicable, with phone number if by telephone) Disposition Plan: Admit to inpatient (indicate anticipated LOS)  Time spent: 70 min  GARDNER, JARED M.  [facility] Pager [phone]  If 7PM-7AM, please contact night-coverage [email or web address] Password [facility] 02/28/2013, 9:14 PM

## 2013-02-28 NOTE — Progress Notes (Signed)
Unit CM UR Completed by [facility] ED CM  W. Sylvestre Rathgeber RN  

## 2013-02-28 NOTE — ED Notes (Signed)
Pt arrived to CT from triage.

## 2013-02-28 NOTE — ED Notes (Signed)
Pt reports the facial numbness that she was feeling upon arrival to ED has resolved. No facial droop, no weakness, pt states she has chronic left eye vision problems. Pt able to read from NIH booklet appropriately, no inattention noted.

## 2013-03-01 ENCOUNTER — Encounter: Payer: Self-pay | Admitting: *Deleted

## 2013-03-01 DIAGNOSIS — G459 Transient cerebral ischemic attack, unspecified: Secondary | ICD-10-CM | POA: Diagnosis not present

## 2013-03-01 DIAGNOSIS — F411 Generalized anxiety disorder: Secondary | ICD-10-CM

## 2013-03-01 DIAGNOSIS — E785 Hyperlipidemia, unspecified: Secondary | ICD-10-CM

## 2013-03-01 DIAGNOSIS — I6350 Cerebral infarction due to unspecified occlusion or stenosis of unspecified cerebral artery: Principal | ICD-10-CM

## 2013-03-01 DIAGNOSIS — I369 Nonrheumatic tricuspid valve disorder, unspecified: Secondary | ICD-10-CM

## 2013-03-01 LAB — HEMOGLOBIN A1C
Hgb A1c MFr Bld: 5.7 % — ABNORMAL HIGH (ref <5.7)
Mean Plasma Glucose: 117 mg/dL — ABNORMAL HIGH (ref <117)

## 2013-03-01 LAB — LIPID PANEL
Cholesterol: 187 mg/dL (ref 0–200)
LDL Cholesterol: 107 mg/dL — ABNORMAL HIGH (ref 0–99)
Total CHOL/HDL Ratio: 3.1 RATIO

## 2013-03-01 MED ORDER — ATORVASTATIN CALCIUM 20 MG PO TABS
20.0000 mg | ORAL_TABLET | Freq: Every day | ORAL | Status: DC
  Administered 2013-03-01: 20 mg via ORAL
  Filled 2013-03-01 (×2): qty 1

## 2013-03-01 MED ORDER — ACETAMINOPHEN 325 MG PO TABS
650.0000 mg | ORAL_TABLET | Freq: Four times a day (QID) | ORAL | Status: DC | PRN
  Administered 2013-03-01 – 2013-03-02 (×3): 650 mg via ORAL
  Filled 2013-03-01 (×3): qty 2

## 2013-03-01 MED ORDER — LOSARTAN POTASSIUM 50 MG PO TABS
100.0000 mg | ORAL_TABLET | Freq: Every day | ORAL | Status: DC
  Administered 2013-03-01 – 2013-03-02 (×2): 100 mg via ORAL
  Filled 2013-03-01 (×2): qty 2

## 2013-03-01 MED ORDER — METOPROLOL TARTRATE 100 MG PO TABS
100.0000 mg | ORAL_TABLET | Freq: Two times a day (BID) | ORAL | Status: DC
  Administered 2013-03-01 – 2013-03-02 (×3): 100 mg via ORAL
  Filled 2013-03-01 (×4): qty 1

## 2013-03-01 MED ORDER — AMLODIPINE BESYLATE 2.5 MG PO TABS
2.5000 mg | ORAL_TABLET | Freq: Every day | ORAL | Status: DC
  Administered 2013-03-01 – 2013-03-02 (×2): 2.5 mg via ORAL
  Filled 2013-03-01 (×2): qty 1

## 2013-03-01 MED ORDER — SIMVASTATIN 40 MG PO TABS
40.0000 mg | ORAL_TABLET | Freq: Every evening | ORAL | Status: DC
  Filled 2013-03-01: qty 1

## 2013-03-01 NOTE — Progress Notes (Signed)
*  PRELIMINARY RESULTS* Echocardiogram 2D Echocardiogram has been performed.  Kristin Wilkerson 03/01/2013, 1:37 PM

## 2013-03-01 NOTE — Progress Notes (Signed)
VASCULAR LAB PRELIMINARY  PRELIMINARY  PRELIMINARY  PRELIMINARY  Carotid duplex  completed.    Preliminary report:  Bilateral:  1-39% ICA stenosis.  Vertebral artery flow is antegrade.      Shun Pletz, RVT 03/01/2013, 11:13 AM

## 2013-03-01 NOTE — Evaluation (Signed)
Physical Therapy Evaluation Patient Details Name: Kristin Wilkerson MRN: [id number] DOB: 1925/05/19 Today's Date: 03/01/2013 Time: 4098-1191 PT Time Calculation (min): 28 min  PT Assessment / Plan / Recommendation History of Present Illness  Patient is an 79 y/o female admitted with blurred left visual field and left facial droop.  MRI positive for acute right occipital infarct.  Clinical Impression  Patient presents with decreased balance, left visual field deficits (attn versus actual field deficit.)  She also demonstrates high risk for falls on DGI with score of 17/24 (less than 19 predictive of falls in older community living adults.)  Feel she will benefit from skilled PT in the acute setting and follow up HHPT for safety in the home and with ADL's.  Did educate in risk factors for stroke, will need further education.    PT Assessment  Patient needs continued PT services    Follow Up Recommendations  Home health PT    Does the patient have the potential to tolerate intense rehabilitation    N/A  Barriers to Discharge   None    Equipment Recommendations  None recommended by PT    Recommendations for Other Services   None  Frequency Min 4X/week    Precautions / Restrictions Precautions Precautions: Fall   Pertinent Vitals/Pain C/o left facial pain (not numbness)      Mobility  Bed Mobility Bed Mobility: Not assessed Details for Bed Mobility Assistance: pt in chair Transfers Transfers: Sit to Stand;Stand to Sit Sit to Stand: 6: Modified independent (Device/Increase time);From chair/3-in-1 Stand to Sit: 6: Modified independent (Device/Increase time);To chair/3-in-1 Ambulation/Gait Ambulation/Gait Assistance: 5: Supervision Ambulation Distance (Feet): 150 Feet Assistive device: None Ambulation/Gait Assistance Details: veers right looking right during  DGI;  Gait Pattern: Wide base of support;Step-through pattern;Decreased stride length Stairs: Yes Stairs Assistance:  5: Supervision Stairs Assistance Details (indicate cue type and reason): supervision for safety Stair Management Technique: Two rails;Alternating pattern Number of Stairs: 2 Modified Rankin (Stroke Patients Only) Pre-Morbid Rankin Score: No symptoms Modified Rankin: Moderately severe disability        PT Diagnosis: Abnormality of gait  PT Problem List: Decreased balance;Decreased mobility;Decreased safety awareness;Impaired sensation PT Treatment Interventions: DME instruction;Balance training;Gait training;Stair training;Functional mobility training;Patient/family education;Therapeutic activities;Therapeutic exercise     PT Goals(Current goals can be found in the care plan section) Acute Rehab PT Goals Patient Stated Goal: To return to independent PT Goal Formulation: With patient/family Time For Goal Achievement: 03/08/13 Potential to Achieve Goals: Good  Visit Information  Last PT Received On: 03/01/13 Assistance Needed: +1 History of Present Illness: Patient is an 80 y/o female admitted with blurred left visual field and left facial droop.  MRI positive for acute right occipital infarct.       Prior Functioning  Home Living Family/patient expects to be discharged to:: Private residence Living Arrangements: Spouse/significant other Available Help at Discharge: Family Home Access: Stairs to enter Secretary/administrator of Steps: 1-2 Entrance Stairs-Rails: None Home Layout: One level Home Equipment: Environmental consultant - 4 wheels (spouse's) Prior Function Level of Independence: Independent Communication Communication: No difficulties    Cognition  Cognition Arousal/Alertness: Awake/alert Behavior During Therapy: WFL for tasks assessed/performed Overall Cognitive Status: Within Functional Limits for tasks assessed    Extremity/Trunk Assessment Upper Extremity Assessment Upper Extremity Assessment: Defer to OT evaluation Lower Extremity Assessment Lower Extremity Assessment:  Overall WFL for tasks assessed   Balance Balance Balance Assessed: Yes Standardized Balance Assessment Standardized Balance Assessment: Dynamic Gait Index Dynamic Gait Index Level Surface: Mild  Impairment Change in Gait Speed: Mild Impairment Gait with Horizontal Head Turns: Mild Impairment Gait with Vertical Head Turns: Normal Gait and Pivot Turn: Mild Impairment Step Over Obstacle: Moderate Impairment Step Around Obstacles: Normal Steps: Mild Impairment Total Score: 17  End of Session PT - End of Session Equipment Utilized During Treatment: Gait belt Activity Tolerance: Patient tolerated treatment well Patient left: in chair;with call bell/phone within reach;with family/visitor present  GP     [facility] 03/01/2013, 9:35 AM  Sheran Lawless, PT [phone] 03/01/2013

## 2013-03-01 NOTE — Evaluation (Signed)
Speech Language Pathology Evaluation Patient Details Name: Kristin Wilkerson MRN: [id number] DOB: 03/02/1926 Today's Date: 03/01/2013 Time: 1520-1530 SLP Time Calculation (min): 10 min  Problem List:  Patient Active Problem List   Diagnosis Date Noted  . CVA (cerebral infarction) RIGHT OCCIPITAL 03/01/2013  . HTN (hypertension) 03/01/2013  . HLD (hyperlipidemia) 03/01/2013  . Tachycardia 12/10/2012  . Chest pain at rest 12/10/2012  . Anxiety 12/10/2012  . Other and unspecified hyperlipidemia 10/24/2012  . Myalgia and myositis, unspecified 09/18/2012  . Other malaise and fatigue 09/18/2012  . Essential hypertension, benign 03/31/2008  . BACK PAIN, CHRONIC 03/31/2008   Past Medical History:  Past Medical History  Diagnosis Date  . Hypertension   . Abnormality of gait   . Disturbance of skin sensation   . Urinary frequency   . Tachycardia, unspecified   . Internal hemorrhoids without mention of complication   . Unspecified pruritic disorder   . Insomnia, unspecified   . Synovial cyst of popliteal space   . Spinal stenosis, other region   . Sciatica   . Lumbago   . Unspecified vitamin D deficiency   . Anxiety   . Hyperlipidemia   . Abnormality of gait   . Unspecified sinusitis (chronic)   . Unspecified pruritic disorder   . Unspecified viral infection, in conditions classified elsewhere and of unspecified site   . Pain in joint, lower leg   . Depressive disorder, not elsewhere classified   . Unspecified glaucoma(365.9)   . Reflux esophagitis   . Diverticulosis of colon (without mention of hemorrhage)   . Pain in joint, site unspecified   . Myalgia and myositis, unspecified   . Osteoporosis, unspecified   . Other malaise and fatigue   . Cervicalgia   . Senile osteoporosis   . Benign neoplasm of colon   . H/O echocardiogram 09/01/11    EF >55%, mild MR, RV systolic pressure elevated at 40-98 mmHg, mod TR  . Normal cardiac stress test 11/16/09    low risk scan, EF  77%    Past Surgical History:  Past Surgical History  Procedure Laterality Date  . Lumbar laminectomy  12/04/2012    Complete decompressive L3-4 and L4-5--Dr. Darrelyn Hillock  . Abdominal hysterectomy    . Cholecystectomy     HPI:  Patient is an 79 y/o female admitted with blurred left visual field and left facial droop.  MRI positive for acute right occipital infarct.   Assessment / Plan / Recommendation Clinical Impression  Cognitive-Linguistic Evaluation completed.  Patient presents with [facility] speech, verbal expression, receptive abilities and cognition.  Patient able to accurately recall hospital course, medication recommendations and solve complex calculations timely and accurately.  Daughter present for eval and confirmed that her mother is at baseline function with her taking and thinking abilities.  No further SLP services warranted at this time.      SLP Assessment  Patient does not need any further Speech Lanaguage Pathology Services    Follow Up Recommendations  None           Pertinent Vitals/Pain none   SLP Goals  N/A  SLP Evaluation Prior Functioning  Cognitive/Linguistic Baseline: Within functional limits Type of Home: House  Lives With: Spouse Available Help at Discharge: Family Vocation: Retired   IT consultant  Overall Cognitive Status: Within Functional Limits for tasks assessed Arousal/Alertness: Awake/alert Orientation Level: Oriented X4 Attention: Sustained Sustained Attention: Appears intact Memory: Appears intact Awareness: Appears intact Problem Solving: Appears intact Safety/Judgment: Appears intact  Comprehension  Auditory Comprehension Overall Auditory Comprehension: Appears within functional limits for tasks assessed    Expression Expression Primary Mode of Expression: Verbal Verbal Expression Overall Verbal Expression: Appears within functional limits for tasks assessed Written Expression Dominant Hand: Right   Oral / Motor Oral Motor/Sensory  Function Overall Oral Motor/Sensory Function: Appears within functional limits for tasks assessed Motor Speech Overall Motor Speech: Appears within functional limits for tasks assessed   GO    Charlane Ferretti., CCC-SLP [phone]  Kristin Wilkerson 03/01/2013, 3:41 PM

## 2013-03-01 NOTE — Progress Notes (Signed)
Stroke Team Progress Note  HISTORY Kristin Wilkerson is an 79 y.o. female history of hypertension and hyperlipidemia presenting with acute onset of blurred vision and numbness involving left side of her face as well as slight facial droop. Onset was at 5:30 PM today 02/28/2013. Has no previous history of stroke nor TIA. Patient's been on aspirin 81 mg per day. CT scan of the head showed no acute intracranial abnormality. Deficits resolved essentially during the emergency room stay, except for minimal residual blurring of vision. NIH stroke score was 0. Patient was not a TPA candidate secondary to Rapidly resolving deficits. She was admitted for further evaluation and treatment.  SUBJECTIVE Her daughter is at the bedside.  Overall she feels her condition is stable. She reports abnormal vision in her left eye - hx old visual defect in that area as well.  OBJECTIVE Most recent Vital Signs: Filed Vitals:   03/01/13 0014 03/01/13 0134 03/01/13 0405 03/01/13 0614  BP:  140/61 156/66 154/75  Pulse:  73 74 76  Temp:  98 F (36.7 C) 97.9 F (36.6 C) 98 F (36.7 C)  TempSrc:  Oral Oral Oral  Resp:  18 18 18   Height: 5\' 1"  (1.549 m)     Weight: 70.3 kg (154 lb 15.7 oz)     SpO2:  98% 98% 100%   CBG (last 3)   Recent Labs  02/28/13 1842  GLUCAP 94    IV Fluid Intake:     MEDICATIONS  . aspirin EC  81 mg Oral Daily  . clopidogrel  75 mg Oral Q breakfast  . DULoxetine  30 mg Oral Daily  . heparin  5,000 Units Subcutaneous Q8H  . latanoprost  1 drop Both Eyes QHS  . simvastatin  20 mg Oral QPM   PRN:  acetaminophen  Diet:  General thin liquids Activity:  OOB with assistance DVT Prophylaxis:  Heparin 5000 units sq tid   CLINICALLY SIGNIFICANT STUDIES Basic Metabolic Panel:  Recent Labs Lab 02/28/13 1832 02/28/13 1849  NA 139 141  K 3.8 3.8  CL 102 103  CO2 27  --   GLUCOSE 96 98  BUN 20 20  CREATININE 0.84 1.00  CALCIUM 9.2  --    Liver Function Tests:  Recent Labs Lab  02/28/13 1832  AST 18  ALT 18  ALKPHOS 69  BILITOT 0.5  PROT 7.0  ALBUMIN 4.0   CBC:  Recent Labs Lab 02/28/13 1832 02/28/13 1849  WBC 6.8  --   NEUTROABS 3.6  --   HGB 14.0 15.0  HCT 41.4 44.0  MCV 95.2  --   PLT 202  --    Coagulation:  Recent Labs Lab 02/28/13 1832  LABPROT 12.1  INR 0.91   Cardiac Enzymes:  Recent Labs Lab 02/28/13 1832  TROPONINI <0.30   Urinalysis:  Recent Labs Lab 02/28/13 1923  COLORURINE YELLOW  LABSPEC 1.008  PHURINE 7.0  GLUCOSEU NEGATIVE  HGBUR NEGATIVE  BILIRUBINUR NEGATIVE  KETONESUR NEGATIVE  PROTEINUR NEGATIVE  UROBILINOGEN 0.2  NITRITE NEGATIVE  LEUKOCYTESUR SMALL*   Lipid Panel    Component Value Date/Time   CHOL 187 03/01/2013 0516   TRIG 101 03/01/2013 0516   HDL 60 03/01/2013 0516   HDL 54 10/22/2012 1048   CHOLHDL 3.1 03/01/2013 0516   CHOLHDL 3.3 10/22/2012 1048   VLDL 20 03/01/2013 0516   LDLCALC 107* 03/01/2013 0516   LDLCALC 106* 10/22/2012 1048   HgbA1C  No results found for this basename: HGBA1C  Urine Drug Screen:     Component Value Date/Time   LABOPIA NONE DETECTED 02/28/2013 1923   COCAINSCRNUR NONE DETECTED 02/28/2013 1923   LABBENZ NONE DETECTED 02/28/2013 1923   AMPHETMU NONE DETECTED 02/28/2013 1923   THCU NONE DETECTED 02/28/2013 1923   LABBARB NONE DETECTED 02/28/2013 1923    Alcohol Level:  Recent Labs Lab 02/28/13 1832  ETH <11    CT of the brain  02/28/2013   No acute abnormality.  Stable mild atrophy.    MRI of the brain  02/28/2013  1. Acute ischemic infarct involving the right occipital lobe. No evidence of hemorrhagic conversion. 2. Moderate age-related atrophy.   MRA of the brain  02/28/2013    1. Multi focal irregularity involving the middle cerebral and posterior cerebral arteries, likely related to underlying atherosclerotic disease. No high-grade flow-limiting stenosis identified. 2. No MRA evidence of intracranial aneurysm.     2D Echocardiogram    Carotid Doppler     CXR    EKG  Sinus rhythm. Left axis deviation. Abnormal R-wave progression, early transition. Prolonged QT interval. Since last tracing. Nonspecific T wave abnormality  Therapy Recommendations home PT  Physical Exam   Pleasant elderly Caucasian lady not in distress.Awake alert. Afebrile. Head is nontraumatic. Neck is supple without bruit. Hearing is slightly reduced bilaterallyl. Cardiac exam no murmur or gallop. Lungs are clear to auscultation. Distal pulses are well felt. Neurological Exam : ;  Awake  Alert oriented x 3. Normal speech and language.eye movements full without nystagmus.fundi were not visualized. Vision acuity and fields appear diminished in left upper quadrant which is old per patient.l. Hearing is normal. Palatal movements are normal. Face symmetric. Tongue midline. Normal strength, tone, reflexes and coordination. Normal sensation. Gait deferred. ASSESSMENT [name] is a 78 y.o. female presenting with blurred vision, left facial numbness, left facial droop. Imaging confirms a right occipital infarct. 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  On aspirin 81 mg orally every day prior to admission. Now on aspirin 81 mg orally every day and clopidogrel 75 mg orally every day for secondary stroke prevention. Patient with resultant left upper field cut, left facial weakness, left facial hemisensory deficit. Work up underway.   Headache today, no headache yesterday Hypertension Hyperlipidemia, LDL 107, on zocor 20 PTA, now on zocor 20, goal LDL < 100 Anxiety Hx gait disorder Family hx stroke (mother and father)  Hospital day # 1  TREATMENT/PLAN  Continue clopidogrel 75 mg orally every day for secondary stroke prevention. Discontinued aspirin. There is no indication for dual antiplatelets in this pt from the stroke perspective.  Given new storke dx, Increase zocor dose to 40 daily  Resume home BP medications  Therapy evals  - home PT recommended  F/u carotid doppler and 2D echo  Once stroke workup completed, ok for discharge from stroke perspective  Follow up with Dr. Pearlean Brownie in 2 months  Annie Main, MSN, RN, ANVP-BC, ANP-BC, [facility] Pager: [phone] 03/01/2013 8:46 AM  I have personally obtained a history, examined the patient, evaluated imaging results, and formulated the assessment and plan of care. I agree with the above. Delia Heady, MD

## 2013-03-01 NOTE — Evaluation (Signed)
Occupational Therapy Evaluation Patient Details Name: Kristin Wilkerson MRN: [id number] DOB: 1925-11-04 Today's Date: 03/01/2013 Time: 4098-1191 OT Time Calculation (min): 33 min  OT Assessment / Plan / Recommendation History of present illness Patient is an 79 y/o female admitted with blurred left visual field and left facial droop.  MRI positive for acute right occipital infarct.   Clinical Impression   Pt is pleasant and cooperative during session.  Demonstrates slight decreased dynamic balance.  She reports blurriness in the left visual field and some difficulty see however with visual testing at bedside pt performed well and was able to read from the newspaper with corrective lenses without difficulty.  Feel this needs to be further evaluated via optometry consult and perimetry testing.  Would recommend HHOT eval for safety and performance of home management tasks for further evaluation.  Recommend initial 24 hour supervision as well for safety.      OT Assessment  All further OT needs can be met in the next venue of care    Follow Up Recommendations  Supervision/Assistance - 24 hour       Equipment Recommendations  None recommended by OT          Precautions / Restrictions Precautions Precautions: Fall Restrictions Weight Bearing Restrictions: No   Pertinent Vitals/Pain Pt with no report of pain during session    ADL  Eating/Feeding: Simulated;Independent Where Assessed - Eating/Feeding: Edge of bed Grooming: Simulated;Modified independent Where Assessed - Grooming: Unsupported standing Upper Body Bathing: Simulated;Set up Where Assessed - Upper Body Bathing: Unsupported sitting Lower Body Bathing: Simulated;Set up Where Assessed - Lower Body Bathing: Unsupported sit to stand Upper Body Dressing: Simulated;Set up Where Assessed - Upper Body Dressing: Unsupported sitting Lower Body Dressing: Simulated;Supervision/safety Where Assessed - Lower Body Dressing: Unsupported  sit to stand Toilet Transfer: Simulated;Supervision/safety Toilet Transfer Method: Other (comment) (ambulate without assistive device) Toilet Transfer Equipment: Comfort height toilet Toileting - Clothing Manipulation and Hygiene: Simulated;Minimal assistance Where Assessed - Engineer, mining and Hygiene: Sit to stand from 3-in-1 or toilet Tub/Shower Transfer Method: Not assessed Equipment Used: Gait belt Transfers/Ambulation Related to ADLs: Pt able to ambulate at a supervision level without use of assistive device. ADL Comments: Pt is overall supervision level for selfcare tasks and simulated ADLs.  Will have initial supervision from her son at home per daughter.      OT Diagnosis: Generalized weakness;Blindness and low vision  OT Problem List: Impaired balance (sitting and/or standing);Impaired vision/perception    Visit Information  Last OT Received On: 03/01/13 Assistance Needed: +1 History of Present Illness: Patient is an 79 y/o female admitted with blurred left visual field and left facial droop.  MRI positive for acute right occipital infarct.       Prior Functioning     Home Living Family/patient expects to be discharged to:: Private residence Living Arrangements: Spouse/significant other Available Help at Discharge: Family Type of Home: House Home Access: Stairs to enter Secretary/administrator of Steps: 1-2 Entrance Stairs-Rails: None Home Layout: One level Home Equipment: None  Lives With: Spouse Prior Function Level of Independence: Independent Communication Communication: No difficulties Dominant Hand: Right         Vision/Perception Vision - History Baseline Vision: No visual deficits Visual History: Other (comment) (slight left peripheral vision loss) Patient Visual Report: Blurring of vision;Other (comment) (pt reports not seeing as well in the left eye.) Vision - Assessment Eye Alignment: Within Functional Limits Vision  Assessment: Vision tested Ocular Range of Motion: Within Functional Limits  Tracking/Visual Pursuits: Able to track stimulus in all quads without difficulty Visual Fields: No apparent deficits Additional Comments: Pt able to read from newspaper with 95% accurracy including words on the left side of the page.  With gross visual field testing did not note any difficulty but needs further perimetry testing. Perception Perception: Within Functional Limits Praxis Praxis: Intact   Cognition  Cognition Arousal/Alertness: Awake/alert Behavior During Therapy: WFL for tasks assessed/performed Overall Cognitive Status: Within Functional Limits for tasks assessed    Extremity/Trunk Assessment Upper Extremity Assessment Upper Extremity Assessment: Overall WFL for tasks assessed Lower Extremity Assessment Lower Extremity Assessment: Defer to PT evaluation Cervical / Trunk Assessment Cervical / Trunk Assessment: Normal     Mobility Bed Mobility Bed Mobility: Supine to Sit Supine to Sit: 6: Modified independent (Device/Increase time) Transfers Transfers: Sit to Stand Sit to Stand: 5: Supervision;With upper extremity assist;From bed Stand to Sit: 5: Supervision;With upper extremity assist;To bed           End of Session OT - End of Session Equipment Utilized During Treatment: Gait belt Activity Tolerance: Patient tolerated treatment well Patient left: in bed;with call bell/phone within reach Nurse Communication: Mobility status      Kristin Wilkerson OTR/L 03/01/2013, 3:37 PM

## 2013-03-01 NOTE — Progress Notes (Signed)
[facility] PROGRESS NOTE  Kristin Wilkerson WUJ:[id number] DOB: 1926-04-09 DOA: 02/28/2013 PCP: Kimber Relic, MD  Assessment/Plan:  CVA (right occipital); continue patient on Plavix -Complete workup, echocardiogram pending, carotid Doppler pending. -OT evaluation pending -PT recommendations are for home health  HTN; patient's BP elevated however given patient's diagnosis of new stroke within the last 24 hours will allow patient's BP to run high. -Continue current medication  Anxiety; continue Cymbalta  HLD; within AHA guidelines continue Zocor    Code Status: Full Family Communication: Daughter present for discussion of plan of care Disposition Plan:    Consultants:  Neurology  Procedures: Echocardiogram; pending  Carotid Doppler; pending    MRI/MRI brain 02/28/2013 MRI HEAD:  1. Acute ischemic infarct involving the right occipital lobe as  above. No evidence of hemorrhagic conversion.  2. Moderate age-related atrophy.  MRA HEAD:  1. Multi focal irregularity involving the middle cerebral and  posterior cerebral arteries, likely related to underlying  atherosclerotic disease. No high-grade flow-limiting stenosis  identified.  2. No MRA evidence of intracranial aneurysm    Antibiotics:   HPI/Subjective: Kristin Wilkerson is a 79 y.o. WF PMHx HTN, HLD, anxiety, myalgia and myositis unspecified, HLD. Presented to the ED with acute onset of blurred L visual field, left facial numbness, and L facial droop. Symptoms onset at 5:30pm today, no PMH of stroke nor TIA. Called code stroke, CT head negative, but facial droop resolved and L visual field vision improved while patient was in CT scan so no TPA given. TODAY. Continues to have some increased blurriness in her left lateral fields of vision. No additional complaint   Objective: Filed Vitals:   03/01/13 0014 03/01/13 0134 03/01/13 0405 03/01/13 0614  BP:  140/61 156/66 154/75  Pulse:  73 74 76  Temp:  98 F  (36.7 C) 97.9 F (36.6 C) 98 F (36.7 C)  TempSrc:  Oral Oral Oral  Resp:  18 18 18   Height: 5\' 1"  (1.549 m)     Weight: 70.3 kg (154 lb 15.7 oz)     SpO2:  98% 98% 100%   No intake or output data in the 24 hours ending 03/01/13 0732 Filed Weights   02/28/13 2141 03/01/13 0014  Weight: 70.3 kg (154 lb 15.7 oz) 70.3 kg (154 lb 15.7 oz)    Exam:   General:  A./O. x4, NAD  Cardiovascular: Regular rhythm and rate, negative murmurs rubs gallops, DP/PT pulse one plus  Respiratory: Clear to auscultation bilateral  Abdomen: Obese, nontender, nondistended, plus bowel  Musculoskeletal: Negative pedal edema  Neurologic; pupils equal round reactive to light and accommodation, cranial nerves II through XII intact, extremity strength 5 over 5 throughout, tongue/Uvela midline, finger to nose to finger within normal limits bilateral, negative pronator drift, negative Romberg, blurriness in the left lateral upper and lower eye fields ambulation within normal limits  Data Reviewed: Basic Metabolic Panel:  Recent Labs Lab 02/28/13 1832 02/28/13 1849  NA 139 141  K 3.8 3.8  CL 102 103  CO2 27  --   GLUCOSE 96 98  BUN 20 20  CREATININE 0.84 1.00  CALCIUM 9.2  --    Liver Function Tests:  Recent Labs Lab 02/28/13 1832  AST 18  ALT 18  ALKPHOS 69  BILITOT 0.5  PROT 7.0  ALBUMIN 4.0   No results found for this basename: LIPASE, AMYLASE,  in the last 168 hours No results found for this basename: AMMONIA,  in the last 168 hours  CBC:  Recent Labs Lab 02/28/13 1832 02/28/13 1849  WBC 6.8  --   NEUTROABS 3.6  --   HGB 14.0 15.0  HCT 41.4 44.0  MCV 95.2  --   PLT 202  --    Cardiac Enzymes:  Recent Labs Lab 02/28/13 1832  TROPONINI <0.30   BNP (last 3 results) No results found for this basename: PROBNP,  in the last 8760 hours CBG:  Recent Labs Lab 02/28/13 1842  GLUCAP 94    No results found for this or any previous visit (from the past 240 hour(s)).    Studies: Ct Head Wo Contrast  02/28/2013   CLINICAL DATA:  Left facial numbness and left blurred vision.  EXAM: CT HEAD WITHOUT CONTRAST  TECHNIQUE: Contiguous axial images were obtained from the base of the skull through the vertex without intravenous contrast.  COMPARISON:  03/20/2012.  FINDINGS: Stable mildly enlarged ventricles and subarachnoid spaces. No intracranial hemorrhage, mass lesion or CT evidence of acute infarction. Unremarkable bones. Small right maxillary sinus retention cyst. Stable 1.3 cm oval mass with a small amount of calcification in the right parietal scalp, most likely representing a sebaceous cyst.  IMPRESSION: No acute abnormality.  Stable mild atrophy.  These results were called by telephone at the time of interpretation on 02/28/2013 at 6:44 PM to Dr. [name], who verbally acknowledged these results.   Electronically Signed   By: Gordan Payment M.D.   On: 02/28/2013 18:45   Mr Brain Wo Contrast  02/28/2013   CLINICAL DATA:  Acute onset of blurry left vision, left facial numbness, and left facial droop  EXAM: MRI HEAD WITHOUT CONTRAST  MRA HEAD WITHOUT CONTRAST  TECHNIQUE: Multiplanar, multiecho pulse sequences of the brain and surrounding structures were obtained without intravenous contrast. Angiographic images of the head were obtained using MRA technique without contrast.  COMPARISON:  Prior CT from earlier the same day  FINDINGS: MRI HEAD FINDINGS  There is abnormal restricted diffusion seen involving the medial right occipital lobe (series 5, image 11). Corresponding signal dropout is seen on the ADC map. Overall size of this area measures approximately 2.8 x 3.1 cm. No significant FLAIR signal is seen within this region. Finding is most compatible with acute right occipital infarct. No evidence of hemorrhagic conversion. No thalamic involvement identified. Involvement identified. No hemorrhage is seen elsewhere within the brain.  Moderate age-related atrophy is present. No  mass or midline shift is identified. The ventricles are normal in size and appearance without evidence of hydrocephalus. There is no extra-axial fluid collection.  The orbits and optic nerves are within normal limits. Craniocervical junction is unremarkable. Pituitary gland demonstrates a normal appearance with normal signal intensity.  Small mucous retention cyst is noted within the medial aspect of the right maxillary sinus. Paranasal sinuses are otherwise clear. No mastoid effusion.  Calvarium demonstrates a normal appearance with normal signal intensity. A focal exostosis is noted from the right parietal bone. Scalp is otherwise unremarkable.  MRA HEAD FINDINGS  The internal carotid arteries are of normal caliber with widely patent antegrade flow. Mild multi focal irregularity within the cavernous segments is likely related to underlying atherosclerotic disease. No high-grade flow-limiting stenosis identified. The A1 segments, anterior communicating artery, and anterior cerebral arteries are widely patent. No high-grade stenosis. Mild multi focal irregularity is seen within the middle cerebral arteries bilaterally without high-grade stenosis. The opacified distal middle cerebral artery branches are grossly normal. The posterior communicating arteries are within normal limits. No intracranial aneurysm  seen within the anterior circulation.  The visualized vertebral arteries are widely patent. The vertebrobasilar junction is normal. Basilar artery is widely patent without high-grade stenosis. No basilar tip aneurysm. Multi focal irregularity is seen within the posterior cerebral arteries bilaterally without high-grade stenosis or occlusion. The posterior inferior cerebellar arteries are unremarkable. Anterior inferior cerebellar arteries are not well evaluated on this exam. Superior cerebellar arteries are grossly within normal limits.  IMPRESSION: MRI HEAD:  1. Acute ischemic infarct involving the right occipital  lobe as above. No evidence of hemorrhagic conversion. 2. Moderate age-related atrophy.  MRA HEAD:  1. Multi focal irregularity involving the middle cerebral and posterior cerebral arteries, likely related to underlying atherosclerotic disease. No high-grade flow-limiting stenosis identified. 2. No MRA evidence of intracranial aneurysm.   Electronically Signed   By: Rise Mu M.D.   On: 02/28/2013 23:31   Mr Maxine Glenn Head/brain Wo Cm  02/28/2013   CLINICAL DATA:  Acute onset of blurry left vision, left facial numbness, and left facial droop  EXAM: MRI HEAD WITHOUT CONTRAST  MRA HEAD WITHOUT CONTRAST  TECHNIQUE: Multiplanar, multiecho pulse sequences of the brain and surrounding structures were obtained without intravenous contrast. Angiographic images of the head were obtained using MRA technique without contrast.  COMPARISON:  Prior CT from earlier the same day  FINDINGS: MRI HEAD FINDINGS  There is abnormal restricted diffusion seen involving the medial right occipital lobe (series 5, image 11). Corresponding signal dropout is seen on the ADC map. Overall size of this area measures approximately 2.8 x 3.1 cm. No significant FLAIR signal is seen within this region. Finding is most compatible with acute right occipital infarct. No evidence of hemorrhagic conversion. No thalamic involvement identified. Involvement identified. No hemorrhage is seen elsewhere within the brain.  Moderate age-related atrophy is present. No mass or midline shift is identified. The ventricles are normal in size and appearance without evidence of hydrocephalus. There is no extra-axial fluid collection.  The orbits and optic nerves are within normal limits. Craniocervical junction is unremarkable. Pituitary gland demonstrates a normal appearance with normal signal intensity.  Small mucous retention cyst is noted within the medial aspect of the right maxillary sinus. Paranasal sinuses are otherwise clear. No mastoid effusion.   Calvarium demonstrates a normal appearance with normal signal intensity. A focal exostosis is noted from the right parietal bone. Scalp is otherwise unremarkable.  MRA HEAD FINDINGS  The internal carotid arteries are of normal caliber with widely patent antegrade flow. Mild multi focal irregularity within the cavernous segments is likely related to underlying atherosclerotic disease. No high-grade flow-limiting stenosis identified. The A1 segments, anterior communicating artery, and anterior cerebral arteries are widely patent. No high-grade stenosis. Mild multi focal irregularity is seen within the middle cerebral arteries bilaterally without high-grade stenosis. The opacified distal middle cerebral artery branches are grossly normal. The posterior communicating arteries are within normal limits. No intracranial aneurysm seen within the anterior circulation.  The visualized vertebral arteries are widely patent. The vertebrobasilar junction is normal. Basilar artery is widely patent without high-grade stenosis. No basilar tip aneurysm. Multi focal irregularity is seen within the posterior cerebral arteries bilaterally without high-grade stenosis or occlusion. The posterior inferior cerebellar arteries are unremarkable. Anterior inferior cerebellar arteries are not well evaluated on this exam. Superior cerebellar arteries are grossly within normal limits.  IMPRESSION: MRI HEAD:  1. Acute ischemic infarct involving the right occipital lobe as above. No evidence of hemorrhagic conversion. 2. Moderate age-related atrophy.  MRA HEAD:  1.  Multi focal irregularity involving the middle cerebral and posterior cerebral arteries, likely related to underlying atherosclerotic disease. No high-grade flow-limiting stenosis identified. 2. No MRA evidence of intracranial aneurysm.   Electronically Signed   By: Rise Mu M.D.   On: 02/28/2013 23:31    Scheduled Meds: . aspirin EC  81 mg Oral Daily  . clopidogrel  75  mg Oral Q breakfast  . DULoxetine  30 mg Oral Daily  . heparin  5,000 Units Subcutaneous Q8H  . latanoprost  1 drop Both Eyes QHS  . simvastatin  20 mg Oral QPM   Continuous Infusions:   Principal Problem:   TIA (transient ischemic attack)    Time spent:40 min   Charmine Bockrath, J  [facility] Pager [phone]. If 7PM-7AM, please contact night-coverage at [email or web address], password [facility] 03/01/2013, 7:32 AM  LOS: 1 day

## 2013-03-02 DIAGNOSIS — I10 Essential (primary) hypertension: Secondary | ICD-10-CM

## 2013-03-02 DIAGNOSIS — I509 Heart failure, unspecified: Secondary | ICD-10-CM

## 2013-03-02 DIAGNOSIS — I5030 Unspecified diastolic (congestive) heart failure: Secondary | ICD-10-CM

## 2013-03-02 DIAGNOSIS — I2789 Other specified pulmonary heart diseases: Secondary | ICD-10-CM

## 2013-03-02 MED ORDER — METOPROLOL TARTRATE 100 MG PO TABS: 100.0000 mg | ORAL_TABLET | Freq: Two times a day (BID) | ORAL | Status: DC

## 2013-03-02 MED ORDER — ATORVASTATIN CALCIUM 20 MG PO TABS: 20.0000 mg | ORAL_TABLET | Freq: Every day | ORAL | Status: DC

## 2013-03-02 MED ORDER — CLOPIDOGREL BISULFATE 75 MG PO TABS: 75.0000 mg | ORAL_TABLET | Freq: Every day | ORAL | Status: DC

## 2013-03-02 NOTE — Progress Notes (Signed)
Stroke Team Progress Note  HISTORY Kristin Wilkerson is an 79 y.o. female history of hypertension and hyperlipidemia presenting with acute onset of blurred vision and numbness involving left side of her face as well as slight facial droop. Onset was at 5:30 PM 02/28/2013. Has no previous history of stroke nor TIA. Patient's been on aspirin 81 mg per day. CT scan of the head showed no acute intracranial abnormality. Deficits resolved essentially during the emergency room stay, except for minimal residual blurring of vision. NIH stroke score was 0. Patient was not a TPA candidate secondary to Rapidly resolving deficits. She was admitted for further evaluation and treatment.  SUBJECTIVE No family members are present today. The patient states her numbness has resolved. The only residual she notes at this time are the left visual field deficits appear. She feels ready for discharge.  OBJECTIVE Most recent Vital Signs: Filed Vitals:   03/01/13 1829 03/01/13 2141 03/02/13 0136 03/02/13 0526  BP: 135/65 123/68 150/64 154/75  Pulse: 70 67 65 65  Temp: 97.8 F (36.6 C) 97.8 F (36.6 C) 97.6 F (36.4 C) 98 F (36.7 C)  TempSrc: Oral Oral Oral Oral  Resp: 18 18 18 18   Height:      Weight:      SpO2: 96% 98% 97% 99%   CBG (last 3)   Recent Labs  02/28/13 1842  GLUCAP 94    IV Fluid Intake:     MEDICATIONS  . amLODipine  2.5 mg Oral Daily  . atorvastatin  20 mg Oral q1800  . clopidogrel  75 mg Oral Q breakfast  . DULoxetine  30 mg Oral Daily  . heparin  5,000 Units Subcutaneous Q8H  . latanoprost  1 drop Both Eyes QHS  . losartan  100 mg Oral Daily  . metoprolol tartrate  100 mg Oral BID   PRN:  acetaminophen  Diet:  General thin liquids Activity:  OOB with assistance DVT Prophylaxis:  Heparin 5000 units sq tid   CLINICALLY SIGNIFICANT STUDIES Basic Metabolic Panel:   Recent Labs Lab 02/28/13 1832 02/28/13 1849  NA 139 141  K 3.8 3.8  CL 102 103  CO2 27  --   GLUCOSE 96 98   BUN 20 20  CREATININE 0.84 1.00  CALCIUM 9.2  --    Liver Function Tests:   Recent Labs Lab 02/28/13 1832  AST 18  ALT 18  ALKPHOS 69  BILITOT 0.5  PROT 7.0  ALBUMIN 4.0   CBC:   Recent Labs Lab 02/28/13 1832 02/28/13 1849  WBC 6.8  --   NEUTROABS 3.6  --   HGB 14.0 15.0  HCT 41.4 44.0  MCV 95.2  --   PLT 202  --    Coagulation:   Recent Labs Lab 02/28/13 1832  LABPROT 12.1  INR 0.91   Cardiac Enzymes:   Recent Labs Lab 02/28/13 1832  TROPONINI <0.30   Urinalysis:   Recent Labs Lab 02/28/13 1923  COLORURINE YELLOW  LABSPEC 1.008  PHURINE 7.0  GLUCOSEU NEGATIVE  HGBUR NEGATIVE  BILIRUBINUR NEGATIVE  KETONESUR NEGATIVE  PROTEINUR NEGATIVE  UROBILINOGEN 0.2  NITRITE NEGATIVE  LEUKOCYTESUR SMALL*   Lipid Panel    Component Value Date/Time   CHOL 187 03/01/2013 0516   TRIG 101 03/01/2013 0516   HDL 60 03/01/2013 0516   HDL 54 10/22/2012 1048   CHOLHDL 3.1 03/01/2013 0516   CHOLHDL 3.3 10/22/2012 1048   VLDL 20 03/01/2013 0516   LDLCALC 107* 03/01/2013 0516  LDLCALC 106* 10/22/2012 1048   HgbA1C  Lab Results  Component Value Date   HGBA1C 5.7* 03/01/2013    Urine Drug Screen:     Component Value Date/Time   LABOPIA NONE DETECTED 02/28/2013 1923   COCAINSCRNUR NONE DETECTED 02/28/2013 1923   LABBENZ NONE DETECTED 02/28/2013 1923   AMPHETMU NONE DETECTED 02/28/2013 1923   THCU NONE DETECTED 02/28/2013 1923   LABBARB NONE DETECTED 02/28/2013 1923    Alcohol Level:   Recent Labs Lab 02/28/13 1832  ETH <11    CT of the brain  02/28/2013   No acute abnormality.  Stable mild atrophy.    MRI of the brain  02/28/2013  1. Acute ischemic infarct involving the right occipital lobe. No evidence of hemorrhagic conversion. 2. Moderate age-related atrophy.   MRA of the brain  02/28/2013    1. Multi focal irregularity involving the middle cerebral and posterior cerebral arteries, likely related to underlying atherosclerotic disease. No  high-grade flow-limiting stenosis identified. 2. No MRA evidence of intracranial aneurysm.     2D Echocardiogram - 03/01/2013 - ejection fraction 55-60% - no cardiac source of emboli identified.  Carotid Doppler  - 03/01/2013 - Preliminary report: Bilateral: 1-39% ICA stenosis. Vertebral artery flow is antegrade.   CXR    EKG  Sinus rhythm. Left axis deviation. Abnormal R-wave progression, early transition. Prolonged QT interval. Since last tracing. Nonspecific T wave abnormality  Therapy Recommendations home PT  Physical Exam   Pleasant elderly Caucasian lady not in distress.Awake alert. Afebrile. Head is nontraumatic. Neck is supple without bruit. Hearing is slightly reduced bilaterallyl. Cardiac exam no murmur or gallop. Lungs are clear to auscultation. Distal pulses are well felt. Neurological Exam : ;  Awake  Alert oriented x 3. Normal speech and language.eye movements full without nystagmus.fundi were not visualized. Vision acuity and fields appear diminished in left upper quadrant which is old per patient.l. Hearing is normal. Palatal movements are normal. Face symmetric. Tongue midline. Normal strength, tone, reflexes and coordination. Normal sensation. Gait deferred.   ASSESSMENT [name] is a 79 y.o. female presenting with blurred vision, left facial numbness, left facial droop. Imaging confirms a right occipital infarct. 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  On aspirin 81 mg orally every day prior to admission. Now on Plavix 75 mg daily for secondary stroke prevention. Patient with resultant left upper field cut, left facial weakness, left facial hemisensory deficit. Work up underway.   Headache today, no headache yesterday Hypertension Hyperlipidemia, LDL 107, on zocor 20 PTA, now on zocor 20, goal LDL < 100 Anxiety Hx gait disorder Family hx stroke (mother and father)  Hospital day # 2  TREATMENT/PLAN  Continue  clopidogrel 75 mg orally every day for secondary stroke prevention. Discontinued aspirin. There is no indication for dual antiplatelets in this pt from the stroke perspective.  Given new stroke Zocor changed to Lipitor 20 mg daily.  Resume home BP medications  Therapy evals - home PT recommended  OK for discharge from stroke perspective  Follow up with Dr. Pearlean Brownie in 2 months  Delton See PA-C [facility] Pager [phone] 03/02/2013, 7:55 AM  I have personally obtained a history, examined the patient, evaluated imaging results, and formulated the assessment and plan of care. I agree with the above.  Elspeth Cho, DO Neurology-Stroke

## 2013-03-02 NOTE — Progress Notes (Signed)
Physical Therapy Treatment Patient Details Name: Kristin Wilkerson MRN: [id number] DOB: 11/26/1925 Today's Date: 03/02/2013 Time: 1308-6578 PT Time Calculation (min): 12 min  PT Assessment / Plan / Recommendation  History of Present Illness Patient is an 79 y/o female admitted with blurred left visual field and left facial droop.  MRI positive for acute right occipital infarct.   PT Comments   Pt making great progress with mobility, close to baseline function.   Follow Up Recommendations  Home health PT     Equipment Recommendations  None recommended by PT       Frequency Min 4X/week   Progress towards PT Goals Progress towards PT goals: Progressing toward goals  Plan Current plan remains appropriate    Precautions / Restrictions Precautions Precautions: Fall       Mobility  Bed Mobility Bed Mobility: Not assessed Details for Bed Mobility Assistance: pt standing in room with nursing on arrival going over discharge instructions Transfers Stand to Sit: 6: Modified independent (Device/Increase time);To chair/3-in-1;With armrests;With upper extremity assist Ambulation/Gait Ambulation/Gait Assistance: 5: Supervision Ambulation Distance (Feet): 800 Feet Assistive device: None Ambulation/Gait Assistance Details: worked on scanning left/right throughout gait with minimal veering noted with head turns.  Gait Pattern: Step-through pattern;Decreased stride length Stairs Assistance: 5: Supervision Stair Management Technique: One rail Left;Forwards;Alternating pattern Number of Stairs: 5 Modified Rankin (Stroke Patients Only) Pre-Morbid Rankin Score: No symptoms Modified Rankin: Slight disability      PT Goals (current goals can now be found in the care plan section) Acute Rehab PT Goals Patient Stated Goal: To return to independent PT Goal Formulation: With patient/family Time For Goal Achievement: 03/08/13 Potential to Achieve Goals: Good Additional Goals Additional Goal #1:  Patient to demonstrate appropriate compensatory strategies in busy environment for left visual deficit without cues.  Visit Information  Last PT Received On: 03/02/13 Assistance Needed: +1 History of Present Illness: Patient is an 79 y/o female admitted with blurred left visual field and left facial droop.  MRI positive for acute right occipital infarct.    Subjective Data  Patient Stated Goal: To return to independent   Cognition  Cognition Arousal/Alertness: Awake/alert Behavior During Therapy: WFL for tasks assessed/performed Overall Cognitive Status: Within Functional Limits for tasks assessed    Balance  Dynamic Gait Index Gait with Horizontal Head Turns: Mild Impairment Gait with Vertical Head Turns: Normal High Level Balance High Level Balance Activites: Head turns High Level Balance Comments: without UE support and min guard assist to supervison: heel/toe walking x10 and then alt forward toe taps to step x10 each foot.  End of Session PT - End of Session Equipment Utilized During Treatment: Gait belt Activity Tolerance: Patient tolerated treatment well Patient left: in chair;with call bell/phone within reach;with family/visitor present   GP     Sallyanne Kuster 03/02/2013, 11:15 AM  Sallyanne Kuster, PTA Office- [phone]

## 2013-03-02 NOTE — Progress Notes (Signed)
Patient discharged home in stable condition, alert, oriented and conversant to have Home Health Physical Therapist set up with [facility].

## 2013-03-02 NOTE — Progress Notes (Signed)
   CARE MANAGEMENT NOTE 03/02/2013  Patient:  Kristin Wilkerson, Kristin Wilkerson   Account Number:  [id number]  Date Initiated:  03/02/2013  Documentation initiated by:  [facility]  Subjective/Objective Assessment:   adm: blurred left visual field and left facial droop     Action/Plan:   discharge planning   Anticipated DC Date:  03/02/2013   Anticipated DC Plan:  HOME W HOME HEALTH SERVICES      DC Planning Services  CM consult      Choice offered to / List presented to:  C-1 Patient        HH arranged  HH-2 PT      [facility] agency  [facility]   Status of service:  Completed, signed off Medicare Important Message given?   (If response is "NO", the following Medicare IM given date fields will be blank) Date Medicare IM given:   Date Additional Medicare IM given:    Discharge Disposition:  HOME W HOME HEALTH SERVICES  Per UR Regulation:    If discussed at Long Length of Stay Meetings, dates discussed:    Comments:  03/02/13 11:00 CM spoke with pt in room to offer choice. Pt has had [facility] before and requests it again.  Address and contact numbers verified.  Referral faxed to [facility] for HHPT. No DME recc.  and pt states her husband who is relatively same size and height has a roling walker at home.  No other CM needs were communicated.  Freddy Jaksch, BSN, CM [phone].

## 2013-03-02 NOTE — Discharge Summary (Signed)
Physician Discharge Summary  Kristin Wilkerson JYN:[id number] DOB: Mar 31, 1926 DOA: 02/28/2013  PCP: Kimber Relic, MD  Admit date: 02/28/2013 Discharge date: 03/02/2013  Time spent:  Recommendations for Outpatient Follow-up:   CVA (right occipital);  -Complete workup, echocardiogram pending, carotid Doppler pending.  -OT; recommends outpatient I. Evaluation; refer to ophthalmologist -PT recommendations are for home health -Continue Plavix 75 mg daily for secondary stroke prevention, discontinue aspirin -Low up with Dr.Sethi in 2 months   HTN; patient's BP elevated however given patient's diagnosis of new stroke within the last 48 hours will allow patient's BP to run high. PCP to address -Continue current medication  Diastolic CHF; -See hypertension  Pulmonary hypertension; - See HTN   Anxiety; continue Cymbalta   HLD; within AHA guidelines discharge on Lipitor 20mg   per neurology recommendation      Discharge Diagnoses:  Principal Problem:   CVA (cerebral infarction) RIGHT OCCIPITAL Active Problems:   Essential hypertension, benign   Myalgia and myositis, unspecified   Other and unspecified hyperlipidemia   Anxiety   HTN (hypertension)   HLD (hyperlipidemia)   Discharge Condition: Stable  Diet recommendation: Heart healthy  Filed Weights   02/28/13 2141 03/01/13 0014  Weight: 70.3 kg (154 lb 15.7 oz) 70.3 kg (154 lb 15.7 oz)    History of present illness:  Kristin Wilkerson is a 79 y.o. WF PMHx HTN, HLD, anxiety, myalgia and myositis unspecified, HLD. Presented to the ED with acute onset of blurred L visual field, left facial numbness, and L facial droop. Symptoms onset at 5:30pm today, no PMH of stroke nor TIA. Called code stroke, CT head negative, but facial droop resolved and L visual field vision improved while patient was in CT scan so no TPA given. 03/01/2013 Continues to have some increased blurriness in her left lateral fields of vision. No additional  complaint. TODAY she is beginning to clear (can see the TV a lot better)    Consultants:  Neurology   Procedures:  Echocardiogram; pending  - Left ventricle: moderate LVH.  -LVEF= 55% to 60%. -(grade 1 diastolic dysfunction). - Mitral valve: Mildly calcified annulus. - Right ventricle: Systolic pressure was increased. - Atrial septum: No defect or patent foramen ovale was identified. - Pulmonary arteries: PA peak pressure: 51mm Hg (S).  - Rt ventricular systolic pressure increased c/w moderate pulmonary hypertension.   Carotid Doppler;  - Bilateral: 1-39% ICA stenosis. Vertebral artery flow is antegrade.    MRI/MRI brain 02/28/2013 MRI HEAD:  1. Acute ischemic infarct involving the right occipital lobe as  above. No evidence of hemorrhagic conversion.  2. Moderate age-related atrophy.  MRA HEAD:  1. Multi focal irregularity involving the middle cerebral and  posterior cerebral arteries, likely related to underlying  atherosclerotic disease. No high-grade flow-limiting stenosis  identified.  2. No MRA evidence of intracranial aneurysm   Discharge Exam: Filed Vitals:   03/01/13 1829 03/01/13 2141 03/02/13 0136 03/02/13 0526  BP: 135/65 123/68 150/64 154/75  Pulse: 70 67 65 65  Temp: 97.8 F (36.6 C) 97.8 F (36.6 C) 97.6 F (36.4 C) 98 F (36.7 C)  TempSrc: Oral Oral Oral Oral  Resp: 18 18 18 18   Height:      Weight:      SpO2: 96% 98% 97% 99%    General: A./O. x4, NAD Cardiovascular: Regular in rate, negative murmurs rubs gallops Respiratory: Clear to auscultation bilateral  Discharge Instructions   Future Appointments Provider Department Dept Phone   03/20/2013 2:45 PM  Kimber Relic, MD [facility] [phone]       Medication List    ASK your doctor about these medications       amLODipine 5 MG tablet  Commonly known as:  NORVASC  Take 0.5 tablets (2.5 mg total) by mouth daily.     aspirin EC 81 MG tablet  Take 81 mg by mouth daily.      CALCIUM 600+D 600-400 MG-UNIT per tablet  Generic drug:  Calcium Carbonate-Vitamin D  Take one tablet by mouth twice daily     cholecalciferol 1000 UNITS tablet  Commonly known as:  VITAMIN D  Take one tablet once daily for vitamin d     DULoxetine 30 MG capsule  Commonly known as:  CYMBALTA  One daily  To help pains and nerves.     latanoprost 0.005 % ophthalmic solution  Commonly known as:  XALATAN  Instill one drop to both eyes at bedtime to treat glaucoma     losartan 100 MG tablet  Commonly known as:  COZAAR  Take 100 mg by mouth daily. Take one tablet once a day for blood pressure     metoprolol tartrate 25 MG tablet  Commonly known as:  LOPRESSOR  Take one tablet twice daily to reduce palpitations     simvastatin 20 MG tablet  Commonly known as:  ZOCOR  Take 1 tablet (20 mg total) by mouth every evening.       No Known Allergies     Follow-up Information   Follow up with Gates Rigg, MD. Schedule an appointment as soon as possible for a visit in 2 months. (stroke clinic)    Specialties:  Neurology, Radiology   Contact information:   [address] [place] [address] [phone]        The results of significant diagnostics from this hospitalization (including imaging, microbiology, ancillary and laboratory) are listed below for reference.    Significant Diagnostic Studies: Ct Head Wo Contrast  02/28/2013   CLINICAL DATA:  Left facial numbness and left blurred vision.  EXAM: CT HEAD WITHOUT CONTRAST  TECHNIQUE: Contiguous axial images were obtained from the base of the skull through the vertex without intravenous contrast.  COMPARISON:  03/20/2012.  FINDINGS: Stable mildly enlarged ventricles and subarachnoid spaces. No intracranial hemorrhage, mass lesion or CT evidence of acute infarction. Unremarkable bones. Small right maxillary sinus retention cyst. Stable 1.3 cm oval mass with a small amount of calcification in the right  parietal scalp, most likely representing a sebaceous cyst.  IMPRESSION: No acute abnormality.  Stable mild atrophy.  These results were called by telephone at the time of interpretation on 02/28/2013 at 6:44 PM to Dr. [name], who verbally acknowledged these results.   Electronically Signed   By: Gordan Payment M.D.   On: 02/28/2013 18:45   Mr Brain Wo Contrast  02/28/2013   CLINICAL DATA:  Acute onset of blurry left vision, left facial numbness, and left facial droop  EXAM: MRI HEAD WITHOUT CONTRAST  MRA HEAD WITHOUT CONTRAST  TECHNIQUE: Multiplanar, multiecho pulse sequences of the brain and surrounding structures were obtained without intravenous contrast. Angiographic images of the head were obtained using MRA technique without contrast.  COMPARISON:  Prior CT from earlier the same day  FINDINGS: MRI HEAD FINDINGS  There is abnormal restricted diffusion seen involving the medial right occipital lobe (series 5, image 11). Corresponding signal dropout is seen on the ADC map. Overall size of this area measures approximately  2.8 x 3.1 cm. No significant FLAIR signal is seen within this region. Finding is most compatible with acute right occipital infarct. No evidence of hemorrhagic conversion. No thalamic involvement identified. Involvement identified. No hemorrhage is seen elsewhere within the brain.  Moderate age-related atrophy is present. No mass or midline shift is identified. The ventricles are normal in size and appearance without evidence of hydrocephalus. There is no extra-axial fluid collection.  The orbits and optic nerves are within normal limits. Craniocervical junction is unremarkable. Pituitary gland demonstrates a normal appearance with normal signal intensity.  Small mucous retention cyst is noted within the medial aspect of the right maxillary sinus. Paranasal sinuses are otherwise clear. No mastoid effusion.  Calvarium demonstrates a normal appearance with normal signal intensity. A focal  exostosis is noted from the right parietal bone. Scalp is otherwise unremarkable.  MRA HEAD FINDINGS  The internal carotid arteries are of normal caliber with widely patent antegrade flow. Mild multi focal irregularity within the cavernous segments is likely related to underlying atherosclerotic disease. No high-grade flow-limiting stenosis identified. The A1 segments, anterior communicating artery, and anterior cerebral arteries are widely patent. No high-grade stenosis. Mild multi focal irregularity is seen within the middle cerebral arteries bilaterally without high-grade stenosis. The opacified distal middle cerebral artery branches are grossly normal. The posterior communicating arteries are within normal limits. No intracranial aneurysm seen within the anterior circulation.  The visualized vertebral arteries are widely patent. The vertebrobasilar junction is normal. Basilar artery is widely patent without high-grade stenosis. No basilar tip aneurysm. Multi focal irregularity is seen within the posterior cerebral arteries bilaterally without high-grade stenosis or occlusion. The posterior inferior cerebellar arteries are unremarkable. Anterior inferior cerebellar arteries are not well evaluated on this exam. Superior cerebellar arteries are grossly within normal limits.  IMPRESSION: MRI HEAD:  1. Acute ischemic infarct involving the right occipital lobe as above. No evidence of hemorrhagic conversion. 2. Moderate age-related atrophy.  MRA HEAD:  1. Multi focal irregularity involving the middle cerebral and posterior cerebral arteries, likely related to underlying atherosclerotic disease. No high-grade flow-limiting stenosis identified. 2. No MRA evidence of intracranial aneurysm.   Electronically Signed   By: Rise Mu M.D.   On: 02/28/2013 23:31   Mr Maxine Glenn Head/brain Wo Cm  02/28/2013   CLINICAL DATA:  Acute onset of blurry left vision, left facial numbness, and left facial droop  EXAM: MRI HEAD  WITHOUT CONTRAST  MRA HEAD WITHOUT CONTRAST  TECHNIQUE: Multiplanar, multiecho pulse sequences of the brain and surrounding structures were obtained without intravenous contrast. Angiographic images of the head were obtained using MRA technique without contrast.  COMPARISON:  Prior CT from earlier the same day  FINDINGS: MRI HEAD FINDINGS  There is abnormal restricted diffusion seen involving the medial right occipital lobe (series 5, image 11). Corresponding signal dropout is seen on the ADC map. Overall size of this area measures approximately 2.8 x 3.1 cm. No significant FLAIR signal is seen within this region. Finding is most compatible with acute right occipital infarct. No evidence of hemorrhagic conversion. No thalamic involvement identified. Involvement identified. No hemorrhage is seen elsewhere within the brain.  Moderate age-related atrophy is present. No mass or midline shift is identified. The ventricles are normal in size and appearance without evidence of hydrocephalus. There is no extra-axial fluid collection.  The orbits and optic nerves are within normal limits. Craniocervical junction is unremarkable. Pituitary gland demonstrates a normal appearance with normal signal intensity.  Small mucous retention cyst is  noted within the medial aspect of the right maxillary sinus. Paranasal sinuses are otherwise clear. No mastoid effusion.  Calvarium demonstrates a normal appearance with normal signal intensity. A focal exostosis is noted from the right parietal bone. Scalp is otherwise unremarkable.  MRA HEAD FINDINGS  The internal carotid arteries are of normal caliber with widely patent antegrade flow. Mild multi focal irregularity within the cavernous segments is likely related to underlying atherosclerotic disease. No high-grade flow-limiting stenosis identified. The A1 segments, anterior communicating artery, and anterior cerebral arteries are widely patent. No high-grade stenosis. Mild multi focal  irregularity is seen within the middle cerebral arteries bilaterally without high-grade stenosis. The opacified distal middle cerebral artery branches are grossly normal. The posterior communicating arteries are within normal limits. No intracranial aneurysm seen within the anterior circulation.  The visualized vertebral arteries are widely patent. The vertebrobasilar junction is normal. Basilar artery is widely patent without high-grade stenosis. No basilar tip aneurysm. Multi focal irregularity is seen within the posterior cerebral arteries bilaterally without high-grade stenosis or occlusion. The posterior inferior cerebellar arteries are unremarkable. Anterior inferior cerebellar arteries are not well evaluated on this exam. Superior cerebellar arteries are grossly within normal limits.  IMPRESSION: MRI HEAD:  1. Acute ischemic infarct involving the right occipital lobe as above. No evidence of hemorrhagic conversion. 2. Moderate age-related atrophy.  MRA HEAD:  1. Multi focal irregularity involving the middle cerebral and posterior cerebral arteries, likely related to underlying atherosclerotic disease. No high-grade flow-limiting stenosis identified. 2. No MRA evidence of intracranial aneurysm.   Electronically Signed   By: Rise Mu M.D.   On: 02/28/2013 23:31    Microbiology: No results found for this or any previous visit (from the past 240 hour(s)).   Labs: Basic Metabolic Panel:  Recent Labs Lab 02/28/13 1832 02/28/13 1849  NA 139 141  K 3.8 3.8  CL 102 103  CO2 27  --   GLUCOSE 96 98  BUN 20 20  CREATININE 0.84 1.00  CALCIUM 9.2  --    Liver Function Tests:  Recent Labs Lab 02/28/13 1832  AST 18  ALT 18  ALKPHOS 69  BILITOT 0.5  PROT 7.0  ALBUMIN 4.0   No results found for this basename: LIPASE, AMYLASE,  in the last 168 hours No results found for this basename: AMMONIA,  in the last 168 hours CBC:  Recent Labs Lab 02/28/13 1832 02/28/13 1849  WBC 6.8   --   NEUTROABS 3.6  --   HGB 14.0 15.0  HCT 41.4 44.0  MCV 95.2  --   PLT 202  --    Cardiac Enzymes:  Recent Labs Lab 02/28/13 1832  TROPONINI <0.30   BNP: BNP (last 3 results) No results found for this basename: PROBNP,  in the last 8760 hours CBG:  Recent Labs Lab 02/28/13 1842  GLUCAP 94       Signed:  Carolyne Littles, MD [facility] [phone] pager

## 2013-03-06 DIAGNOSIS — I509 Heart failure, unspecified: Secondary | ICD-10-CM | POA: Diagnosis not present

## 2013-03-06 DIAGNOSIS — IMO0001 Reserved for inherently not codable concepts without codable children: Secondary | ICD-10-CM | POA: Diagnosis not present

## 2013-03-06 DIAGNOSIS — I10 Essential (primary) hypertension: Secondary | ICD-10-CM | POA: Diagnosis not present

## 2013-03-06 DIAGNOSIS — I69993 Ataxia following unspecified cerebrovascular disease: Secondary | ICD-10-CM | POA: Diagnosis not present

## 2013-03-07 ENCOUNTER — Encounter: Payer: Self-pay | Admitting: Nurse Practitioner

## 2013-03-07 ENCOUNTER — Ambulatory Visit (INDEPENDENT_AMBULATORY_CARE_PROVIDER_SITE_OTHER): Payer: Medicare Other | Admitting: Nurse Practitioner

## 2013-03-07 VITALS — BP 118/70 | HR 60 | Temp 97.4°F | Resp 14 | Wt 150.4 lb

## 2013-03-07 DIAGNOSIS — I6350 Cerebral infarction due to unspecified occlusion or stenosis of unspecified cerebral artery: Secondary | ICD-10-CM

## 2013-03-07 DIAGNOSIS — E785 Hyperlipidemia, unspecified: Secondary | ICD-10-CM

## 2013-03-07 DIAGNOSIS — I509 Heart failure, unspecified: Secondary | ICD-10-CM

## 2013-03-07 DIAGNOSIS — F419 Anxiety disorder, unspecified: Secondary | ICD-10-CM

## 2013-03-07 DIAGNOSIS — I5030 Unspecified diastolic (congestive) heart failure: Secondary | ICD-10-CM

## 2013-03-07 DIAGNOSIS — I10 Essential (primary) hypertension: Secondary | ICD-10-CM

## 2013-03-07 DIAGNOSIS — I639 Cerebral infarction, unspecified: Secondary | ICD-10-CM

## 2013-03-07 DIAGNOSIS — F411 Generalized anxiety disorder: Secondary | ICD-10-CM

## 2013-03-07 NOTE — Progress Notes (Addendum)
Patient ID: Kristin Wilkerson, female   DOB: Mar 18, 1926, 79 y.o.   MRN: [id number]   No Known Allergies  Chief Complaint  Patient presents with  . Hospitalization Follow-up    hospital f/u    HPI: Patient is a 79 y.o. female seen in the office today for hospital followup; could not see out of left eye, went to hospital and found to have right occipital CVA; had left facial droop and numbness; this has resolved; no residual weakness, no swallowing issues just worsening of vision Following with ophthalmologist - lost a lot of her eyesight to right eye, left eye is very limited Was placed on plavix daily, lipitor 20 mg daily tolerating medication, getting use to taking all these "new" medications To  follow up with Dr Pearlean Brownie; needs to make appt Review of Systems:  Review of Systems  Constitutional: Negative for fever, chills and malaise/fatigue.  Respiratory: Negative for cough and shortness of breath.   Cardiovascular: Negative for chest pain and palpitations.  Gastrointestinal: Negative for heartburn, abdominal pain, diarrhea and constipation.  Genitourinary: Negative for dysuria, urgency and frequency.  Musculoskeletal: Negative for back pain, joint pain and myalgias.  Neurological: Positive for tingling (bilateral feet) and headaches. Negative for dizziness, sensory change, speech change, focal weakness and weakness.  Psychiatric/Behavioral: The patient is nervous/anxious.      Past Medical History  Diagnosis Date  . Hypertension   . Abnormality of gait   . Disturbance of skin sensation   . Urinary frequency   . Tachycardia, unspecified   . Internal hemorrhoids without mention of complication   . Unspecified pruritic disorder   . Insomnia, unspecified   . Synovial cyst of popliteal space   . Spinal stenosis, other region   . Sciatica   . Lumbago   . Unspecified vitamin D deficiency   . Anxiety   . Hyperlipidemia   . Abnormality of gait   . Unspecified sinusitis (chronic)   .  Unspecified pruritic disorder   . Unspecified viral infection, in conditions classified elsewhere and of unspecified site   . Pain in joint, lower leg   . Depressive disorder, not elsewhere classified   . Unspecified glaucoma(365.9)   . Reflux esophagitis   . Diverticulosis of colon (without mention of hemorrhage)   . Pain in joint, site unspecified   . Myalgia and myositis, unspecified   . Osteoporosis, unspecified   . Other malaise and fatigue   . Cervicalgia   . Senile osteoporosis   . Benign neoplasm of colon   . H/O echocardiogram 09/01/11    EF >55%, mild MR, RV systolic pressure elevated at 78-29 mmHg, mod TR  . Normal cardiac stress test 11/16/09    low risk scan, EF  77%   Past Surgical History  Procedure Laterality Date  . Lumbar laminectomy  12/04/2012    Complete decompressive L3-4 and L4-5--Dr. Darrelyn Hillock  . Abdominal hysterectomy    . Cholecystectomy     Social History:   reports that she has never smoked. She does not have any smokeless tobacco history on file. She reports that she does not drink alcohol or use illicit drugs.  Family History  Problem Relation Age of Onset  . Cancer Sister   . Stroke Mother   . Stroke Father     Medications: Patient's Medications  New Prescriptions   No medications on file  Previous Medications   AMLODIPINE (NORVASC) 5 MG TABLET    Take 0.5 tablets (2.5 mg total) by mouth  daily.   ATORVASTATIN (LIPITOR) 20 MG TABLET    Take 1 tablet (20 mg total) by mouth daily at 6 PM.   CALCIUM CARBONATE-VITAMIN D (CALCIUM 600+D) 600-400 MG-UNIT PER TABLET    Take one tablet by mouth twice daily   CHOLECALCIFEROL (VITAMIN D) 1000 UNITS TABLET    Take one tablet once daily for vitamin d   CLOPIDOGREL (PLAVIX) 75 MG TABLET    Take 1 tablet (75 mg total) by mouth daily with breakfast.   DULOXETINE (CYMBALTA) 30 MG CAPSULE    One daily  To help pains and nerves.   LATANOPROST (XALATAN) 0.005 % OPHTHALMIC SOLUTION    Instill one drop to both eyes at  bedtime to treat glaucoma   LOSARTAN (COZAAR) 100 MG TABLET    Take 100 mg by mouth daily. Take one tablet once a day for blood pressure   METOPROLOL (LOPRESSOR) 100 MG TABLET    Take 1 tablet (100 mg total) by mouth 2 (two) times daily.  Modified Medications   No medications on file  Discontinued Medications   No medications on file     Physical Exam:  Filed Vitals:   03/07/13 1147  BP: 118/70  Pulse: 60  Temp: 97.4 F (36.3 C)  TempSrc: Oral  Resp: 14  Weight: 150 lb 6.4 oz (68.221 kg)  SpO2: 96%   Physical Exam GENERAL APPEARANCE: Alert, conversant. Appropriately groomed. No acute distress.  SKIN: No diaphoresis rash, or wounds HEAD: Normocephalic, atraumatic  EYES: Conjunctiva/lids clear. Pupils round, reactive. EOMs intact.  EARS: External exam WNL, Hearing grossly normal.  NOSE: No deformity or discharge.  MOUTH/THROAT: Lips w/o lesions. Mouth and throat normal. Tongue moist, w/o lesion.  NECK: No thyroid tenderness, enlargement or nodule  RESPIRATORY: Breathing is even, unlabored. Lung sounds are clear   CARDIOVASCULAR: Heart RRR no murmurs, rubs or gallops. No peripheral edema.  ARTERIAL: radial pulse 2+  GASTROINTESTINAL: Abdomen is soft, non-tender, not distended w/ normal bowel sounds.  GENITOURINARY: Bladder non tender, not distended  MUSCULOSKELETAL: No abnormal joints or musculature NEUROLOGIC: Oriented X3. Cranial nerves 2-12 grossly intact. Moves all extremities no tremor. PSYCHIATRIC: Mood and affect appropriate to situation, no behavioral issues  Labs reviewed: Basic Metabolic Panel:  Recent Labs  47/82/95 1641 02/28/13 1832 02/28/13 1849  NA 141 139 141  K 4.3 3.8 3.8  CL 102 102 103  CO2 24 27  --   GLUCOSE 83 96 98  BUN 18 20 20   CREATININE 0.93 0.84 1.00  CALCIUM 9.7 9.2  --    Liver Function Tests:  Recent Labs  09/18/12 1641 02/28/13 1832  AST 18 18  ALT 17 18  ALKPHOS 69 69  BILITOT 0.5 0.5  PROT 6.8 7.0  ALBUMIN  --  4.0    No results found for this basename: LIPASE, AMYLASE,  in the last 8760 hours No results found for this basename: AMMONIA,  in the last 8760 hours CBC:  Recent Labs  09/18/12 1641 02/28/13 1832 02/28/13 1849  WBC 5.9 6.8  --   NEUTROABS 2.8 3.6  --   HGB 13.3 14.0 15.0  HCT 40.4 41.4 44.0  MCV 92 95.2  --   PLT  --  202  --    Lipid Panel:  Recent Labs  10/22/12 1048 03/01/13 0516  CHOL  --  187  HDL 54 60  LDLCALC 106* 107*  TRIG 102 101  CHOLHDL 3.3 3.1  Significant Diagnostic Studies:  Ct Head Wo Contrast  02/28/2013 CLINICAL DATA: Left facial numbness and left blurred vision. EXAM: CT HEAD WITHOUT CONTRAST TECHNIQUE: Contiguous axial images were obtained from the base of the skull through the vertex without intravenous contrast. COMPARISON: 03/20/2012. FINDINGS: Stable mildly enlarged ventricles and subarachnoid spaces. No intracranial hemorrhage, mass lesion or CT evidence of acute infarction. Unremarkable bones. Small right maxillary sinus retention cyst. Stable 1.3 cm oval mass with a small amount of calcification in the right parietal scalp, most likely representing a sebaceous cyst. IMPRESSION: No acute abnormality. Stable mild atrophy. These results were called by telephone at the time of interpretation on 02/28/2013 at 6:44 PM to Dr. [name], who verbally acknowledged these results. Electronically Signed By: Gordan Payment M.D. On: 02/28/2013 18:45  Mr Brain Wo Contrast  02/28/2013 CLINICAL DATA: Acute onset of blurry left vision, left facial numbness, and left facial droop EXAM: MRI HEAD WITHOUT CONTRAST MRA HEAD WITHOUT CONTRAST TECHNIQUE: Multiplanar, multiecho pulse sequences of the brain and surrounding structures were obtained without intravenous contrast. Angiographic images of the head were obtained using MRA technique without contrast. COMPARISON: Prior CT from earlier the same day FINDINGS: MRI HEAD FINDINGS There is abnormal restricted diffusion seen involving the  medial right occipital lobe (series 5, image 11). Corresponding signal dropout is seen on the ADC map. Overall size of this area measures approximately 2.8 x 3.1 cm. No significant FLAIR signal is seen within this region. Finding is most compatible with acute right occipital infarct. No evidence of hemorrhagic conversion. No thalamic involvement identified. Involvement identified. No hemorrhage is seen elsewhere within the brain. Moderate age-related atrophy is present. No mass or midline shift is identified. The ventricles are normal in size and appearance without evidence of hydrocephalus. There is no extra-axial fluid collection. The orbits and optic nerves are within normal limits. Craniocervical junction is unremarkable. Pituitary gland demonstrates a normal appearance with normal signal intensity. Small mucous retention cyst is noted within the medial aspect of the right maxillary sinus. Paranasal sinuses are otherwise clear. No mastoid effusion. Calvarium demonstrates a normal appearance with normal signal intensity. A focal exostosis is noted from the right parietal bone. Scalp is otherwise unremarkable. MRA HEAD FINDINGS The internal carotid arteries are of normal caliber with widely patent antegrade flow. Mild multi focal irregularity within the cavernous segments is likely related to underlying atherosclerotic disease. No high-grade flow-limiting stenosis identified. The A1 segments, anterior communicating artery, and anterior cerebral arteries are widely patent. No high-grade stenosis. Mild multi focal irregularity is seen within the middle cerebral arteries bilaterally without high-grade stenosis. The opacified distal middle cerebral artery branches are grossly normal. The posterior communicating arteries are within normal limits. No intracranial aneurysm seen within the anterior circulation. The visualized vertebral arteries are widely patent. The vertebrobasilar junction is normal. Basilar artery is  widely patent without high-grade stenosis. No basilar tip aneurysm. Multi focal irregularity is seen within the posterior cerebral arteries bilaterally without high-grade stenosis or occlusion. The posterior inferior cerebellar arteries are unremarkable. Anterior inferior cerebellar arteries are not well evaluated on this exam. Superior cerebellar arteries are grossly within normal limits. IMPRESSION: MRI HEAD: 1. Acute ischemic infarct involving the right occipital lobe as above. No evidence of hemorrhagic conversion. 2. Moderate age-related atrophy. MRA HEAD: 1. Multi focal irregularity involving the middle cerebral and posterior cerebral arteries, likely related to underlying atherosclerotic disease. No high-grade flow-limiting stenosis identified. 2. No MRA evidence of intracranial aneurysm. Electronically Signed By: Rise Mu M.D. On: 02/28/2013 23:31  Mr Maxine Glenn Head/brain Wo Cm  02/28/2013  CLINICAL DATA: Acute onset of blurry left vision, left facial numbness, and left facial droop EXAM: MRI HEAD WITHOUT CONTRAST MRA HEAD WITHOUT CONTRAST TECHNIQUE: Multiplanar, multiecho pulse sequences of the brain and surrounding structures were obtained without intravenous contrast. Angiographic images of the head were obtained using MRA technique without contrast. COMPARISON: Prior CT from earlier the same day FINDINGS: MRI HEAD FINDINGS There is abnormal restricted diffusion seen involving the medial right occipital lobe (series 5, image 11). Corresponding signal dropout is seen on the ADC map. Overall size of this area measures approximately 2.8 x 3.1 cm. No significant FLAIR signal is seen within this region. Finding is most compatible with acute right occipital infarct. No evidence of hemorrhagic conversion. No thalamic involvement identified. Involvement identified. No hemorrhage is seen elsewhere within the brain. Moderate age-related atrophy is present. No mass or midline shift is identified. The  ventricles are normal in size and appearance without evidence of hydrocephalus. There is no extra-axial fluid collection. The orbits and optic nerves are within normal limits. Craniocervical junction is unremarkable. Pituitary gland demonstrates a normal appearance with normal signal intensity. Small mucous retention cyst is noted within the medial aspect of the right maxillary sinus. Paranasal sinuses are otherwise clear. No mastoid effusion. Calvarium demonstrates a normal appearance with normal signal intensity. A focal exostosis is noted from the right parietal bone. Scalp is otherwise unremarkable. MRA HEAD FINDINGS The internal carotid arteries are of normal caliber with widely patent antegrade flow. Mild multi focal irregularity within the cavernous segments is likely related to underlying atherosclerotic disease. No high-grade flow-limiting stenosis identified. The A1 segments, anterior communicating artery, and anterior cerebral arteries are widely patent. No high-grade stenosis. Mild multi focal irregularity is seen within the middle cerebral arteries bilaterally without high-grade stenosis. The opacified distal middle cerebral artery branches are grossly normal. The posterior communicating arteries are within normal limits. No intracranial aneurysm seen within the anterior circulation. The visualized vertebral arteries are widely patent. The vertebrobasilar junction is normal. Basilar artery is widely patent without high-grade stenosis. No basilar tip aneurysm. Multi focal irregularity is seen within the posterior cerebral arteries bilaterally without high-grade stenosis or occlusion. The posterior inferior cerebellar arteries are unremarkable. Anterior inferior cerebellar arteries are not well evaluated on this exam. Superior cerebellar arteries are grossly within normal limits. IMPRESSION: MRI HEAD: 1. Acute ischemic infarct involving the right occipital lobe as above. No evidence of hemorrhagic  conversion. 2. Moderate age-related atrophy. MRA HEAD: 1. Multi focal irregularity involving the middle cerebral and posterior cerebral arteries, likely related to underlying atherosclerotic disease. No high-grade flow-limiting stenosis identified. 2. No MRA evidence of intracranial aneurysm. Electronically Signed By: Rise Mu M.D. On: 02/28/2013 23:31   Study Conclusions  - Left ventricle: The cavity size was normal. Wall thickness was increased in a pattern of moderate LVH. There was moderate concentric hypertrophy. Systolic function was normal. The estimated ejection fraction was in the range of 55% to 60%. Wall motion was normal; there were no regional wall motion abnormalities. Doppler parameters are consistent with abnormal left ventricular relaxation (grade 1 diastolic dysfunction). - Mitral valve: Mildly calcified annulus. - Right ventricle: Systolic pressure was increased. - Atrial septum: No defect or patent foramen ovale was identified. - Pulmonary arteries: PA peak pressure: 51mm Hg (S). Impressions:  - The right ventricular systolic pressure was increased consistent with moderate pulmonary hypertension. Transthoracic echocardiography. M-mode, complete 2D, spectral Doppler, and color Doppler. Height: Height: 154.9cm. Height: 61in. Weight: Weight: 70kg. Weight: 154lb. Body mass index:  BMI: 29.2kg/m^2. Body surface area: BSA: 1.35m^2. Blood pressure: 159/67. Patient status: Inpatient. Location: Echo laboratory.  ------------------------------------------------------------  ------------------------------------------------------------ Left ventricle: The cavity size was normal. Wall thickness was increased in a pattern of moderate LVH. There was moderate concentric hypertrophy. Systolic function was normal. The estimated ejection fraction was in the range of 55% to 60%. Wall motion was normal; there were no regional wall motion abnormalities. The tissue Doppler  parameters were normal. Doppler parameters are consistent with abnormal left ventricular relaxation (grade 1 diastolic dysfunction). There was no evidence of elevated ventricular filling pressure by Doppler parameters.  Assessment/Plan 1. Essential hypertension, benign -stable on current medications, improved since hospitalization; conts on lopressor, norvasc and losartan  2. Diastolic CHF Recent echo in hospital; EF 55-60%  3. CVA (cerebral infarction) RIGHT OCCIPITAL Stable; no residual weakness, therapy questions if her balance is "right" conts to work with [facility] PT/OT  Now on plavix, Lipitor, lopressor, Norvasc, and losartan  4. Anxiety -conts on cymbalta  5. Other and unspecified hyperlipidemia -now on Lipitor; tolerating medications without side effects   Keep follow up with Chilton Si, MD

## 2013-03-12 DIAGNOSIS — I69993 Ataxia following unspecified cerebrovascular disease: Secondary | ICD-10-CM | POA: Diagnosis not present

## 2013-03-12 DIAGNOSIS — I10 Essential (primary) hypertension: Secondary | ICD-10-CM | POA: Diagnosis not present

## 2013-03-12 DIAGNOSIS — IMO0001 Reserved for inherently not codable concepts without codable children: Secondary | ICD-10-CM | POA: Diagnosis not present

## 2013-03-12 DIAGNOSIS — I509 Heart failure, unspecified: Secondary | ICD-10-CM | POA: Diagnosis not present

## 2013-03-13 ENCOUNTER — Other Ambulatory Visit: Payer: Self-pay | Admitting: Internal Medicine

## 2013-03-13 DIAGNOSIS — Z1231 Encounter for screening mammogram for malignant neoplasm of breast: Secondary | ICD-10-CM

## 2013-03-15 DIAGNOSIS — I509 Heart failure, unspecified: Secondary | ICD-10-CM | POA: Diagnosis not present

## 2013-03-15 DIAGNOSIS — I10 Essential (primary) hypertension: Secondary | ICD-10-CM | POA: Diagnosis not present

## 2013-03-15 DIAGNOSIS — I69993 Ataxia following unspecified cerebrovascular disease: Secondary | ICD-10-CM | POA: Diagnosis not present

## 2013-03-15 DIAGNOSIS — IMO0001 Reserved for inherently not codable concepts without codable children: Secondary | ICD-10-CM | POA: Diagnosis not present

## 2013-03-20 ENCOUNTER — Encounter: Payer: Self-pay | Admitting: Internal Medicine

## 2013-03-20 ENCOUNTER — Ambulatory Visit (INDEPENDENT_AMBULATORY_CARE_PROVIDER_SITE_OTHER): Payer: Medicare Other | Admitting: Internal Medicine

## 2013-03-20 VITALS — BP 130/78 | HR 57 | Temp 97.9°F | Resp 12 | Wt 154.0 lb

## 2013-03-20 DIAGNOSIS — E785 Hyperlipidemia, unspecified: Secondary | ICD-10-CM

## 2013-03-20 DIAGNOSIS — I10 Essential (primary) hypertension: Secondary | ICD-10-CM

## 2013-03-20 DIAGNOSIS — F419 Anxiety disorder, unspecified: Secondary | ICD-10-CM

## 2013-03-20 DIAGNOSIS — I6350 Cerebral infarction due to unspecified occlusion or stenosis of unspecified cerebral artery: Secondary | ICD-10-CM

## 2013-03-20 DIAGNOSIS — F411 Generalized anxiety disorder: Secondary | ICD-10-CM

## 2013-03-20 DIAGNOSIS — I639 Cerebral infarction, unspecified: Secondary | ICD-10-CM

## 2013-03-20 MED ORDER — AMLODIPINE BESYLATE 5 MG PO TABS: ORAL_TABLET | ORAL | Status: DC

## 2013-03-20 MED ORDER — METOPROLOL TARTRATE 100 MG PO TABS: ORAL_TABLET | ORAL | Status: DC

## 2013-03-20 MED ORDER — LOSARTAN POTASSIUM 100 MG PO TABS: ORAL_TABLET | ORAL | Status: DC

## 2013-03-20 MED ORDER — ATORVASTATIN CALCIUM 20 MG PO TABS: ORAL_TABLET | ORAL | Status: DC

## 2013-03-20 MED ORDER — CLOPIDOGREL BISULFATE 75 MG PO TABS: ORAL_TABLET | ORAL | Status: DC

## 2013-03-20 MED ORDER — LORAZEPAM 1 MG PO TABS: 1.0000 mg | ORAL_TABLET | Freq: Two times a day (BID) | ORAL | Status: DC | PRN

## 2013-03-20 NOTE — Progress Notes (Signed)
Subjective:    Patient ID: Kristin Wilkerson, female    DOB: 1925-08-05, 79 y.o.   MRN: [id number] Chief Complaint  Patient presents with  . Medical Managment of Chronic Issues    2 month follow-up   . Medication Management    Discuss changing amlodipine to 2.5 mg tab vs 5 mg (1/2 tab)      HPI  CVA (cerebral infarction) RIGHT OCCIPITAL: vision unchanged  HTN (hypertension): controlled  HLD (hyperlipidemia): controlled    Current Outpatient Prescriptions on File Prior to Visit  Medication Sig Dispense Refill  . amLODipine (NORVASC) 5 MG tablet Take 0.5 tablets (2.5 mg total) by mouth daily.  45 tablet  3  . atorvastatin (LIPITOR) 20 MG tablet Take 1 tablet (20 mg total) by mouth daily at 6 PM.  30 tablet  0  . Calcium Carbonate-Vitamin D (CALCIUM 600+D) 600-400 MG-UNIT per tablet Take one tablet by mouth twice daily      . cholecalciferol (VITAMIN D) 1000 UNITS tablet Take one tablet once daily for vitamin d      . clopidogrel (PLAVIX) 75 MG tablet Take 1 tablet (75 mg total) by mouth daily with breakfast.  30 tablet  0  . DULoxetine (CYMBALTA) 30 MG capsule One daily  To help pains and nerves.  90 capsule  3  . latanoprost (XALATAN) 0.005 % ophthalmic solution Instill one drop to both eyes at bedtime to treat glaucoma  7.5 mL  4  . losartan (COZAAR) 100 MG tablet Take 100 mg by mouth daily. Take one tablet once a day for blood pressure      . metoprolol (LOPRESSOR) 100 MG tablet Take 1 tablet (100 mg total) by mouth 2 (two) times daily.  60 tablet  0   No current facility-administered medications on file prior to visit.    Review of Systems  Constitutional: Negative.   Eyes:       Right hemianopsia  Respiratory: Negative.   Cardiovascular: Negative.   Gastrointestinal: Negative.   Endocrine: Negative.   Genitourinary: Positive for urgency and frequency.       Nocturia  Musculoskeletal: Positive for arthralgias and myalgias.  Skin: Negative for rash.    Allergic/Immunologic: Negative.   Neurological:       Tingling sensations in lower extremities. TIA/CVA 03/07/2013. Has had resolution of the decreased vision in the right eye and the left facial droop.  Hematological: Negative.   Psychiatric/Behavioral: Negative.        Objective:BP 130/78  Pulse 57  Temp(Src) 97.9 F (36.6 C) (Oral)  Resp 12  Wt 154 lb (69.854 kg)  SpO2 98%    Physical Exam  Constitutional: She is oriented to person, place, and time. She appears well-developed and well-nourished. No distress.  HENT:  Head: Normocephalic.  Right Ear: External ear normal.  Left Ear: External ear normal.  Nose: Nose normal.  Eyes: Conjunctivae and EOM are normal. Pupils are equal, round, and reactive to light.  Mild right hemianopsia laterally  Neck: Normal range of motion. Neck supple. No JVD present. No tracheal deviation present. No thyromegaly present.  Cardiovascular: Exam reveals no distant heart sounds and no friction rub.   Murmur heard.  Systolic murmur is present with a grade of 2/6  Holosystolic  Pulmonary/Chest: Breath sounds normal. No respiratory distress. She has no wheezes. She has no rales. She exhibits no tenderness.  Abdominal: Soft. Bowel sounds are normal. She exhibits no distension and no mass. There is no tenderness.  Musculoskeletal:  Normal range of motion. She exhibits no edema and no tenderness.  Lymphadenopathy:    She has no cervical adenopathy.  Neurological: She is alert and oriented to person, place, and time. She has normal reflexes.  Skin: Skin is warm and dry. No rash noted. No erythema. No pallor.  Psychiatric: She has a normal mood and affect. Her behavior is normal. Judgment and thought content normal.          Assessment & Plan:  1. CVA (cerebral infarction) RIGHT OCCIPITAL resolving - clopidogrel (PLAVIX) 75 MG tablet; One each morning for anticoagulation  Dispense: 90 tablet; Refill: 3  2. HTN (hypertension) controlled -  metoprolol (LOPRESSOR) 100 MG tablet; One twice daily to control BP  Dispense: 180 tablet; Refill: 3 - losartan (COZAAR) 100 MG tablet; Take one tablet once daily for blood pressure  Dispense: 90 tablet; Refill: 3  3. HLD (hyperlipidemia) controlled - atorvastatin (LIPITOR) 20 MG tablet; One each evening to control cholesterol  Dispense: 90 tablet; Refill: 3

## 2013-03-20 NOTE — Patient Instructions (Signed)
Continue current medication.

## 2013-03-29 ENCOUNTER — Ambulatory Visit (HOSPITAL_COMMUNITY)
Admission: RE | Admit: 2013-03-29 | Discharge: 2013-03-29 | Disposition: A | Discharge status: Home / Self Care | Payer: Medicare Other | Source: Ambulatory Visit | Attending: Internal Medicine | Admitting: Internal Medicine

## 2013-03-29 DIAGNOSIS — Z1231 Encounter for screening mammogram for malignant neoplasm of breast: Secondary | ICD-10-CM | POA: Insufficient documentation

## 2013-04-10 DIAGNOSIS — M1710 Unilateral primary osteoarthritis, unspecified knee: Secondary | ICD-10-CM | POA: Diagnosis not present

## 2013-04-10 DIAGNOSIS — M545 Low back pain: Secondary | ICD-10-CM | POA: Diagnosis not present

## 2013-04-23 DIAGNOSIS — M5137 Other intervertebral disc degeneration, lumbosacral region: Secondary | ICD-10-CM | POA: Diagnosis not present

## 2013-04-29 DIAGNOSIS — H0019 Chalazion unspecified eye, unspecified eyelid: Secondary | ICD-10-CM | POA: Diagnosis not present

## 2013-05-04 DIAGNOSIS — M5137 Other intervertebral disc degeneration, lumbosacral region: Secondary | ICD-10-CM | POA: Diagnosis not present

## 2013-05-08 ENCOUNTER — Encounter: Payer: Self-pay | Admitting: Internal Medicine

## 2013-05-28 DIAGNOSIS — H4011X0 Primary open-angle glaucoma, stage unspecified: Secondary | ICD-10-CM | POA: Diagnosis not present

## 2013-05-28 DIAGNOSIS — H5340 Unspecified visual field defects: Secondary | ICD-10-CM | POA: Diagnosis not present

## 2013-05-28 DIAGNOSIS — H409 Unspecified glaucoma: Secondary | ICD-10-CM | POA: Diagnosis not present

## 2013-05-31 ENCOUNTER — Encounter: Payer: Self-pay | Admitting: Nurse Practitioner

## 2013-05-31 ENCOUNTER — Ambulatory Visit (INDEPENDENT_AMBULATORY_CARE_PROVIDER_SITE_OTHER): Payer: Medicare Other | Admitting: Nurse Practitioner

## 2013-05-31 VITALS — BP 144/76 | HR 63 | Ht 60.5 in | Wt 161.0 lb

## 2013-05-31 DIAGNOSIS — I6350 Cerebral infarction due to unspecified occlusion or stenosis of unspecified cerebral artery: Secondary | ICD-10-CM

## 2013-05-31 DIAGNOSIS — I639 Cerebral infarction, unspecified: Secondary | ICD-10-CM

## 2013-05-31 NOTE — Patient Instructions (Signed)
PLAN: Continue clopidogrel 75 mg orally every day  for secondary stroke prevention and maintain strict control of hypertension with blood pressure goal below 130/90, and lipids with LDL cholesterol goal below 100 mg/dL.  Followup in the future with me in 6 months.  STROKE/TIA INSTRUCTIONS SMOKING Cigarette smoking nearly doubles your risk of having a stroke & is the single most alterable risk factor  If you smoke or have smoked in the last 12 months, you are advised to quit smoking for your health.  Most of the excess cardiovascular risk related to smoking disappears within a year of stopping.  Ask you doctor about anti-smoking medications   Quit Line: 1-800-QUIT NOW  Free Smoking Cessation Classes [phone]  CHOLESTEROL Know your levels; limit fat & cholesterol in your diet  Lab Results  Component Value Date   CHOL 187 03/01/2013   HDL 60 03/01/2013   LDLCALC 107* 03/01/2013   TRIG 101 03/01/2013   CHOLHDL 3.1 03/01/2013      Many patients benefit from treatment even if their cholesterol is at goal.  Goal: Total Cholesterol less than 160  Goal:  LDL less than 100  Goal:  HDL greater than 40  Goal:  Triglycerides less than 150  BLOOD PRESSURE American Stroke Association blood pressure target is less that 120/80 mm/Hg  Your discharge blood pressure is:  BP: 144/76 mmHg  Monitor your blood pressure  Limit your salt and alcohol intake  Many individuals will require more than one medication for high blood pressure  DIABETES (A1c is a blood sugar average for last 3 months) Goal A1c is under 7% (A1c is blood sugar average for last 3 months)  Diabetes: No known diagnosis of diabetes    Lab Results  Component Value Date   HGBA1C 5.7* 03/01/2013    Your A1c can be lowered with medications, healthy diet, and exercise.  Check your blood sugar as directed by your physician  Call your physician if you experience unexplained or low blood sugars.  PHYSICAL  ACTIVITY/REHABILITATION Goal is 30 minutes at least 4 days per week    Activity decreases your risk of heart attack and stroke and makes your heart stronger.  It helps control your weight and blood pressure; helps you relax and can improve your mood.  Participate in a regular exercise program.  Talk with your doctor about the best form of exercise for you (dancing, walking, swimming, cycling).  DIET/WEIGHT Goal is to maintain a healthy weight  Your height is:  Height: 5' 0.5" (153.7 cm) Your current weight is: Weight: 161 lb (73.029 kg) Your body Mass Index (BMI) is:  BMI (Calculated): 31  Following the type of diet specifically designed for you will help prevent another stroke.  Your goal Body Mass Index (BMI) is 19-24.  Healthy food habits can help reduce 3 risk factors for stroke:  High cholesterol, hypertension, and excess weight.

## 2013-05-31 NOTE — Progress Notes (Signed)
PATIENT: Kristin Wilkerson DOB: 02-07-1926   REASON FOR VISIT: hospital follow up for stroke HISTORY FROM: patient  HISTORY OF PRESENT ILLNESS: Kristin Wilkerson is an 80 y.o. female history of hypertension and hyperlipidemia presenting with acute onset of blurred vision and numbness involving left side of her face as well as slight facial droop 5:30 PM 02/28/2013. Has no previous history of stroke nor TIA. Patient was on aspirin 81 mg per day. CT scan of the head showed no acute intracranial abnormality. Deficits resolved essentially during the emergency room stay, except for minimal residual blurring of vision. NIH stroke score was 0. Patient was not a TPA candidate secondary to Rapidly resolving deficits. She was admitted for further evaluation and treatment.  MRI of the brain did show an Acute ischemic infarct involving the right occipital lobe. No evidence of hemorrhagic conversion. Moderate age-related atrophy.  MRA of the brain showed Multi focal irregularity involving the middle cerebral and posterior cerebral arteries, likely related to underlying atherosclerotic disease. No high-grade flow-limiting stenosis identified. No MRA evidence of intracranial aneurysm. 2D Echocardiogram showed an ejection fraction 55-60%, no cardiac source of emboli identified. Carotid Doppler showed Bilateral 1-39% ICA stenosis.    Kristin Wilkerson comes to the office today for first post-hospital stroke visit.  She is accompanied by one of her sons.  She does not feel that she was very affected from the stroke, but she has seen her eye doctor and knows that her vision has worsened; she needed a new eye glass prescription.  She states her BP is usually well controlled and her BP is 144/76 today in our office.  She is tolerating Plavix well without any significant bruising.  She has no weakness or numbness in her extremities and ambulates well.  REVIEW OF SYSTEMS: Full 14 system review of systems performed and notable only for:   Eyes: loss of vision  ALLERGIES: No Known Allergies  HOME MEDICATIONS: Outpatient Prescriptions Prior to Visit  Medication Sig Dispense Refill  . amLODipine (NORVASC) 5 MG tablet 1/2 tablet daily to control BP  45 tablet  3  . atorvastatin (LIPITOR) 20 MG tablet One each evening to control cholesterol  90 tablet  3  . Calcium Carbonate-Vitamin D (CALCIUM 600+D) 600-400 MG-UNIT per tablet Take one tablet by mouth twice daily      . cholecalciferol (VITAMIN D) 1000 UNITS tablet Take one tablet once daily for vitamin d      . clopidogrel (PLAVIX) 75 MG tablet One each morning for anticoagulation  90 tablet  3  . DULoxetine (CYMBALTA) 30 MG capsule One daily  To help pains and nerves.  90 capsule  3  . latanoprost (XALATAN) 0.005 % ophthalmic solution Instill one drop to both eyes at bedtime to treat glaucoma  7.5 mL  4  . LORazepam (ATIVAN) 1 MG tablet Take 1 tablet (1 mg total) by mouth 2 (two) times daily as needed for anxiety.  30 tablet  3  . losartan (COZAAR) 100 MG tablet Take one tablet once daily for blood pressure  90 tablet  3  . metoprolol (LOPRESSOR) 100 MG tablet One twice daily to control BP  180 tablet  3   No facility-administered medications prior to visit.    PAST MEDICAL HISTORY: Past Medical History  Diagnosis Date  . Hypertension   . Abnormality of gait   . Disturbance of skin sensation   . Urinary frequency   . Tachycardia, unspecified   . Internal hemorrhoids  without mention of complication   . Unspecified pruritic disorder   . Insomnia, unspecified   . Synovial cyst of popliteal space   . Spinal stenosis, other region   . Sciatica   . Lumbago   . Unspecified vitamin D deficiency   . Anxiety   . Hyperlipidemia   . Abnormality of gait   . Unspecified sinusitis (chronic)   . Unspecified pruritic disorder   . Unspecified viral infection, in conditions classified elsewhere and of unspecified site   . Pain in joint, lower leg   . Depressive disorder, not  elsewhere classified   . Unspecified glaucoma   . Reflux esophagitis   . Diverticulosis of colon (without mention of hemorrhage)   . Pain in joint, site unspecified   . Myalgia and myositis, unspecified   . Osteoporosis, unspecified   . Other malaise and fatigue   . Cervicalgia   . Senile osteoporosis   . Benign neoplasm of colon   . H/O echocardiogram 09/01/11    EF >55%, mild MR, RV systolic pressure elevated at 40-98 mmHg, mod TR  . Normal cardiac stress test 11/16/09    low risk scan, EF  77%  . Stroke     PAST SURGICAL HISTORY: Past Surgical History  Procedure Laterality Date  . Lumbar laminectomy  12/04/2012    Complete decompressive L3-4 and L4-5--Dr. Darrelyn Hillock  . Abdominal hysterectomy    . Cholecystectomy      FAMILY HISTORY: Family History  Problem Relation Age of Onset  . Cancer Sister   . Stroke Mother   . Stroke Father     SOCIAL HISTORY: History   Social History  . Marital Status: Married    Spouse Name: N/A    Number of Children: 3  . Years of Education: 12th   Occupational History  . retired    Social History Main Topics  . Smoking status: Never Smoker   . Smokeless tobacco: Not on file  . Alcohol Use: No  . Drug Use: No  . Sexual Activity: No   Other Topics Concern  . Not on file   Social History Narrative  . No narrative on file     PHYSICAL EXAM  Filed Vitals:   05/31/13 1354  BP: 144/76  Pulse: 63  Height: 5' 0.5" (1.537 m)  Weight: 161 lb (73.029 kg)   Body mass index is 30.91 kg/(m^2).  Generalized: Well developed, in no acute distress  Head: normocephalic and atraumatic. Oropharynx benign  Neck: Supple, no carotid bruits  Cardiac: Regular rate rhythm, no murmur  Musculoskeletal: No deformity   Neurological examination  Mentation: Alert oriented to time, place, history taking. Follows all commands speech and language fluent Cranial nerve II-XII:  Pupils were equal round reactive to light.  Vision acuity and fields appear  diminished in left upper quadrant which is old per patient.  Facial sensation and strength were normal. Hearing is slightly reduced bilaterally. Uvula tongue midline. head turning and shoulder shrug and were normal and symmetric.Tongue protrusion into cheek strength was normal. Motor: normal bulk and tone, full strength in the BUE, BLE, fine finger movements normal, no pronator drift. No focal weakness Sensory: normal and symmetric to light touch, pinprick, and  vibration  Coordination: finger-nose-finger, heel-to-shin bilaterally, no dysmetria Reflexes:  Deep tendon reflexes in the upper and lower extremities are present and symmetric.  Gait and Station: Rising up from seated position without assistance, normal stance, without trunk ataxia, moderate stride, good arm swing, smooth turning, unable  to perform tiptoe, and heel walking without difficulty.   NIHSS: 0 ModRankin: 1  DIAGNOSTIC DATA (LABS, IMAGING, TESTING) - I reviewed patient records, labs, notes, testing and imaging myself where available.  Lab Results  Component Value Date   WBC 6.8 02/28/2013   HGB 15.0 02/28/2013   HCT 44.0 02/28/2013   MCV 95.2 02/28/2013   PLT 202 02/28/2013      Component Value Date/Time   NA 141 02/28/2013 1849   NA 141 09/18/2012 1641   K 3.8 02/28/2013 1849   CL 103 02/28/2013 1849   CO2 27 02/28/2013 1832   GLUCOSE 98 02/28/2013 1849   GLUCOSE 83 09/18/2012 1641   BUN 20 02/28/2013 1849   BUN 18 09/18/2012 1641   CREATININE 1.00 02/28/2013 1849   CALCIUM 9.2 02/28/2013 1832   PROT 7.0 02/28/2013 1832   PROT 6.8 09/18/2012 1641   ALBUMIN 4.0 02/28/2013 1832   AST 18 02/28/2013 1832   ALT 18 02/28/2013 1832   ALKPHOS 69 02/28/2013 1832   BILITOT 0.5 02/28/2013 1832   GFRNONAA 62* 02/28/2013 1832   GFRAA 72* 02/28/2013 1832   Lab Results  Component Value Date   CHOL 187 03/01/2013   HDL 60 03/01/2013   LDLCALC 107* 03/01/2013   TRIG 101 03/01/2013   CHOLHDL 3.1 03/01/2013   Lab Results    Component Value Date   HGBA1C 5.7* 03/01/2013    ASSESSMENT AND PLAN Kristin Wilkerson is a 80 y.o. female presenting with blurred vision, left facial numbness, left facial droop. Imaging confirms a right occipital infarct. 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 On aspirin 81 mg orally every day prior to admission. Now on Plavix 75 mg daily for secondary stroke prevention. Patient with residual left upper field cut.  PLAN: Continue clopidogrel 75 mg orally every day  for secondary stroke prevention and maintain strict control of hypertension with blood pressure goal below 130/90, and lipids with LDL cholesterol goal below 100 mg/dL.  Followup in the future with me in 6 months.   Ronal Fear, MSN, NP-C 05/31/2013, 2:43 PM [facility] [address] [place], [place] [address] [phone]  Note: This document was prepared with digital dictation and possible smart phrase technology. Any transcriptional errors that result from this process are unintentional.

## 2013-06-12 ENCOUNTER — Encounter: Payer: Self-pay | Admitting: Internal Medicine

## 2013-06-12 ENCOUNTER — Other Ambulatory Visit: Payer: Self-pay | Admitting: *Deleted

## 2013-06-12 ENCOUNTER — Other Ambulatory Visit: Payer: Self-pay | Admitting: Internal Medicine

## 2013-06-12 ENCOUNTER — Ambulatory Visit (INDEPENDENT_AMBULATORY_CARE_PROVIDER_SITE_OTHER): Payer: Medicare Other | Admitting: Internal Medicine

## 2013-06-12 VITALS — BP 132/74 | HR 80 | Temp 96.6°F | Resp 14 | Wt 160.4 lb

## 2013-06-12 DIAGNOSIS — E785 Hyperlipidemia, unspecified: Secondary | ICD-10-CM | POA: Diagnosis not present

## 2013-06-12 DIAGNOSIS — I6350 Cerebral infarction due to unspecified occlusion or stenosis of unspecified cerebral artery: Secondary | ICD-10-CM

## 2013-06-12 DIAGNOSIS — R609 Edema, unspecified: Secondary | ICD-10-CM | POA: Diagnosis not present

## 2013-06-12 DIAGNOSIS — R0602 Shortness of breath: Secondary | ICD-10-CM | POA: Diagnosis not present

## 2013-06-12 DIAGNOSIS — I10 Essential (primary) hypertension: Secondary | ICD-10-CM

## 2013-06-12 DIAGNOSIS — I509 Heart failure, unspecified: Secondary | ICD-10-CM

## 2013-06-12 MED ORDER — FUROSEMIDE 40 MG PO TABS: ORAL_TABLET | ORAL | Status: DC

## 2013-06-12 NOTE — Progress Notes (Signed)
Patient ID: Kristin Wilkerson, female   DOB: [birth year], 80 y.o.   MRN: [id number]    Location:    [facility]  Place of Service:  OFFICE    No Known Allergies  Chief Complaint  Patient presents with  . Acute Visit    both feet/ankle swelling x 2 weeks  . other    throat feels like she has to clear it all the time and wheezing    HPI:   Feet swollen bilaterally for 3 weeks. Painful when walking. Feels like a pillow is on the feet. Says she3 wheezes sometimes. Denies much SOB. Mild DOE. Palpitations have improved  Medications: Patient's Medications  New Prescriptions   No medications on file  Previous Medications   AMLODIPINE (NORVASC) 5 MG TABLET    1/2 tablet daily to control BP   ATORVASTATIN (LIPITOR) 20 MG TABLET    One each evening to control cholesterol   CALCIUM CARBONATE-VITAMIN D (CALCIUM 600+D) 600-400 MG-UNIT PER TABLET    Take one tablet by mouth twice daily   CHOLECALCIFEROL (VITAMIN D) 1000 UNITS TABLET    Take one tablet once daily for vitamin d   CLOPIDOGREL (PLAVIX) 75 MG TABLET    One each morning for anticoagulation   DULOXETINE (CYMBALTA) 30 MG CAPSULE    One daily  To help pains and nerves.   LATANOPROST (XALATAN) 0.005 % OPHTHALMIC SOLUTION    Instill one drop to both eyes at bedtime to treat glaucoma   LORAZEPAM (ATIVAN) 1 MG TABLET    Take 1 tablet (1 mg total) by mouth 2 (two) times daily as needed for anxiety.   LOSARTAN (COZAAR) 100 MG TABLET    Take one tablet once daily for blood pressure   METOPROLOL (LOPRESSOR) 100 MG TABLET    One twice daily to control BP  Modified Medications   No medications on file  Discontinued Medications   No medications on file     Review of Systems  Constitutional: Negative.   Eyes:       Right hemianopsia  Respiratory: Positive for shortness of breath and wheezing.   Cardiovascular: Positive for leg swelling.  Gastrointestinal: Negative.   Endocrine: Negative.   Genitourinary: Positive for urgency and frequency.   Nocturia  Musculoskeletal: Positive for arthralgias and myalgias.  Skin: Negative for rash.  Allergic/Immunologic: Negative.   Neurological:       Tingling sensations in lower extremities. TIA/CVA 03/07/2013. Has had resolution of the decreased vision in the right eye and the left facial droop.  Hematological: Negative.   Psychiatric/Behavioral: Negative.     Filed Vitals:   06/12/13 1158  BP: 132/74  Pulse: 80  Temp: 96.6 F (35.9 C)  TempSrc: Oral  Resp: 14  Weight: 160 lb 6.4 oz (72.757 kg)  SpO2: 95%   Physical Exam  Constitutional: She is oriented to person, place, and time. She appears well-developed and well-nourished. No distress.  HENT:  Head: Normocephalic.  Right Ear: External ear normal.  Left Ear: External ear normal.  Nose: Nose normal.  Eyes: Conjunctivae and EOM are normal. Pupils are equal, round, and reactive to light.  Mild right hemianopsia laterally  Neck: Normal range of motion. Neck supple. No JVD present. No tracheal deviation present. No thyromegaly present.  Cardiovascular: Exam reveals no distant heart sounds and no friction rub.   Murmur heard.  Systolic murmur is present with a grade of 2/6  Holosystolic  Pulmonary/Chest: Breath sounds normal. No respiratory distress. She has no wheezes. She  has no rales. She exhibits no tenderness.  Abdominal: Soft. Bowel sounds are normal. She exhibits no distension and no mass. There is no tenderness.  Musculoskeletal: Normal range of motion. She exhibits edema. She exhibits no tenderness.  Lymphadenopathy:    She has no cervical adenopathy.  Neurological: She is alert and oriented to person, place, and time. She has normal reflexes.  Skin: Skin is warm and dry. No rash noted. No erythema. No pallor.  Psychiatric: She has a normal mood and affect. Her behavior is normal. Judgment and thought content normal.     Labs reviewed: No visits with results within 3 Month(s) from this visit. Latest known visit  with results is:  Admission on 02/28/2013, Discharged on 03/02/2013  Component Date Value Ref Range Status  . Alcohol, Ethyl (B) 02/28/2013 <11  0 - 11 mg/dL Final   Comment:                                 LOWEST DETECTABLE LIMIT FOR                          SERUM ALCOHOL IS 11 mg/dL                          FOR MEDICAL PURPOSES ONLY  . Prothrombin Time 02/28/2013 12.1  11.6 - 15.2 seconds Final  . INR 02/28/2013 0.91  0.00 - 1.49 Final  . aPTT 02/28/2013 24  24 - 37 seconds Final  . WBC 02/28/2013 6.8  4.0 - 10.5 K/uL Final  . RBC 02/28/2013 4.35  3.87 - 5.11 MIL/uL Final  . Hemoglobin 02/28/2013 14.0  12.0 - 15.0 g/dL Final  . HCT 02/28/2013 41.4  36.0 - 46.0 % Final  . MCV 02/28/2013 95.2  78.0 - 100.0 fL Final  . MCH 02/28/2013 32.2  26.0 - 34.0 pg Final  . MCHC 02/28/2013 33.8  30.0 - 36.0 g/dL Final  . RDW 02/28/2013 14.3  11.5 - 15.5 % Final  . Platelets 02/28/2013 202  150 - 400 K/uL Final  . Neutrophils Relative % 02/28/2013 54  43 - 77 % Final  . Neutro Abs 02/28/2013 3.6  1.7 - 7.7 K/uL Final  . Lymphocytes Relative 02/28/2013 32  12 - 46 % Final  . Lymphs Abs 02/28/2013 2.2  0.7 - 4.0 K/uL Final  . Monocytes Relative 02/28/2013 10  3 - 12 % Final  . Monocytes Absolute 02/28/2013 0.7  0.1 - 1.0 K/uL Final  . Eosinophils Relative 02/28/2013 3  0 - 5 % Final  . Eosinophils Absolute 02/28/2013 0.2  0.0 - 0.7 K/uL Final  . Basophils Relative 02/28/2013 1  0 - 1 % Final  . Basophils Absolute 02/28/2013 0.1  0.0 - 0.1 K/uL Final  . Sodium 02/28/2013 139  135 - 145 mEq/L Final  . Potassium 02/28/2013 3.8  3.5 - 5.1 mEq/L Final  . Chloride 02/28/2013 102  96 - 112 mEq/L Final  . CO2 02/28/2013 27  19 - 32 mEq/L Final  . Glucose, Bld 02/28/2013 96  70 - 99 mg/dL Final  . BUN 02/28/2013 20  6 - 23 mg/dL Final  . Creatinine, Ser 02/28/2013 0.84  0.50 - 1.10 mg/dL Final  . Calcium 02/28/2013 9.2  8.4 - 10.5 mg/dL Final  . Total Protein 02/28/2013 7.0  6.0 - 8.3 g/dL Final  .  Albumin 02/28/2013 4.0  3.5 - 5.2 g/dL Final  . AST 02/28/2013 18  0 - 37 U/L Final  . ALT 02/28/2013 18  0 - 35 U/L Final  . Alkaline Phosphatase 02/28/2013 69  39 - 117 U/L Final  . Total Bilirubin 02/28/2013 0.5  0.3 - 1.2 mg/dL Final  . GFR calc non Af Amer 02/28/2013 62* >90 mL/min Final  . GFR calc Af Amer 02/28/2013 72* >90 mL/min Final   Comment: (NOTE)                          The eGFR has been calculated using the CKD EPI equation.                          This calculation has not been validated in all clinical situations.                          eGFR's persistently <90 mL/min signify possible Chronic Kidney                          Disease.  . Troponin I 02/28/2013 <0.30  <0.30 ng/mL Final   Comment:                                 Due to the release kinetics of cTnI,                          a negative result within the first hours                          of the onset of symptoms does not rule out                          myocardial infarction with certainty.                          If myocardial infarction is still suspected,                          repeat the test at appropriate intervals.  . Opiates 02/28/2013 NONE DETECTED  NONE DETECTED Final  . Cocaine 02/28/2013 NONE DETECTED  NONE DETECTED Final  . Benzodiazepines 02/28/2013 NONE DETECTED  NONE DETECTED Final  . Amphetamines 02/28/2013 NONE DETECTED  NONE DETECTED Final  . Tetrahydrocannabinol 02/28/2013 NONE DETECTED  NONE DETECTED Final  . Barbiturates 02/28/2013 NONE DETECTED  NONE DETECTED Final   Comment:                                 DRUG SCREEN FOR MEDICAL PURPOSES                          ONLY.  IF CONFIRMATION IS NEEDED                          FOR ANY PURPOSE, NOTIFY LAB  WITHIN 5 DAYS.                                                          LOWEST DETECTABLE LIMITS                          FOR URINE DRUG SCREEN                          Drug Class       Cutoff (ng/mL)                           Amphetamine      1000                          Barbiturate      200                          Benzodiazepine   200                          Tricyclics       144                          Opiates          300                          Cocaine          300                          THC              50  . Color, Urine 02/28/2013 YELLOW  YELLOW Final  . APPearance 02/28/2013 CLEAR  CLEAR Final  . Specific Gravity, Urine 02/28/2013 1.008  1.005 - 1.030 Final  . pH 02/28/2013 7.0  5.0 - 8.0 Final  . Glucose, UA 02/28/2013 NEGATIVE  NEGATIVE mg/dL Final  . Hgb urine dipstick 02/28/2013 NEGATIVE  NEGATIVE Final  . Bilirubin Urine 02/28/2013 NEGATIVE  NEGATIVE Final  . Ketones, ur 02/28/2013 NEGATIVE  NEGATIVE mg/dL Final  . Protein, ur 02/28/2013 NEGATIVE  NEGATIVE mg/dL Final  . Urobilinogen, UA 02/28/2013 0.2  0.0 - 1.0 mg/dL Final  . Nitrite 02/28/2013 NEGATIVE  NEGATIVE Final  . Leukocytes, UA 02/28/2013 SMALL* NEGATIVE Final  . Sodium 02/28/2013 141  135 - 145 mEq/L Final  . Potassium 02/28/2013 3.8  3.5 - 5.1 mEq/L Final  . Chloride 02/28/2013 103  96 - 112 mEq/L Final  . BUN 02/28/2013 20  6 - 23 mg/dL Final  . Creatinine, Ser 02/28/2013 1.00  0.50 - 1.10 mg/dL Final  . Glucose, Bld 02/28/2013 98  70 - 99 mg/dL Final  . Calcium, Ion 02/28/2013 1.20  1.13 - 1.30 mmol/L Final  . TCO2 02/28/2013 25  0 - 100 mmol/L Final  . Hemoglobin 02/28/2013 15.0  12.0 - 15.0 g/dL Final  . HCT 02/28/2013 44.0  36.0 - 46.0 % Final  . Glucose-Capillary 02/28/2013 94  70 - 99 mg/dL Final  . Troponin i, poc 02/28/2013 0.00  0.00 - 0.08 ng/mL Final  . Comment 3 02/28/2013          Final   Comment: Due to the release kinetics of cTnI,                          a negative result within the first hours                          of the onset of symptoms does not rule out                          myocardial infarction with certainty.                          If myocardial infarction is still  suspected,                          repeat the test at appropriate intervals.  . Squamous Epithelial / LPF 02/28/2013 RARE  RARE Final  . WBC, UA 02/28/2013 3-6  <3 WBC/hpf Final  . Bacteria, UA 02/28/2013 RARE  RARE Final  . Hemoglobin A1C 03/01/2013 5.7* <5.7 % Final   Comment: (NOTE)                                                                                                                         According to the ADA Clinical Practice Recommendations for 2011, when                          HbA1c is used as a screening test:                           >=6.5%   Diagnostic of Diabetes Mellitus                                    (if abnormal result is confirmed)                          5.7-6.4%   Increased risk of developing Diabetes Mellitus                          References:Diagnosis and Classification of Diabetes Mellitus,Diabetes                          RSWN,4627,03(JKKXF 1):S62-S69 and Standards of Medical Care in  Diabetes - 2011,Diabetes Care,2011,34 (Suppl 1):S11-S61.  . Mean Plasma Glucose 03/01/2013 117* <117 mg/dL Final   Performed at [facility]  . Cholesterol 03/01/2013 187  0 - 200 mg/dL Final  . Triglycerides 03/01/2013 101  <150 mg/dL Final  . HDL 03/01/2013 60  >39 mg/dL Final  . Total CHOL/HDL Ratio 03/01/2013 3.1   Final  . VLDL 03/01/2013 20  0 - 40 mg/dL Final  . LDL Cholesterol 03/01/2013 107* 0 - 99 mg/dL Final   Comment:                                 Total Cholesterol/HDL:CHD Risk                          Coronary Heart Disease Risk Table                                              Men   Women                           1/2 Average Risk   3.4   3.3                           Average Risk       5.0   4.4                           2 X Average Risk   9.6   7.1                           3 X Average Risk  23.4   11.0                                                          Use the calculated Patient Ratio                           above and the CHD Risk Table                          to determine the patient's CHD Risk.                                                          ATP III CLASSIFICATION (LDL):                           <100     mg/dL   Optimal                           100-129  mg/dL   Near or Above  Optimal                           130-159  mg/dL   Borderline                           160-189  mg/dL   High                           >190     mg/dL   Very High      Assessment/Plan

## 2013-06-12 NOTE — Patient Instructions (Signed)
Stop amlodipine (Norvasc). Start furosemide to control edema

## 2013-06-13 LAB — BASIC METABOLIC PANEL
BUN/Creatinine Ratio: 20 (ref 11–26)
BUN: 19 mg/dL (ref 8–27)
CO2: 27 mmol/L (ref 18–29)
Calcium: 10.1 mg/dL (ref 8.7–10.3)
Chloride: 102 mmol/L (ref 97–108)
Creatinine, Ser: 0.94 mg/dL (ref 0.57–1.00)
GFR calc Af Amer: 64 mL/min/{1.73_m2} (ref >59)
GFR, EST NON AFRICAN AMERICAN: 55 mL/min/{1.73_m2} — AB (ref >59)
Glucose: 86 mg/dL (ref 65–99)
Potassium: 4.9 mmol/L (ref 3.5–5.2)
SODIUM: 143 mmol/L (ref 134–144)

## 2013-06-13 LAB — LIPID PANEL
Chol/HDL Ratio: 3.8 ratio units (ref 0.0–4.4)
Cholesterol, Total: 161 mg/dL (ref 100–199)
HDL: 42 mg/dL (ref >39)
LDL Calculated: 85 mg/dL (ref 0–99)
Triglycerides: 168 mg/dL — ABNORMAL HIGH (ref 0–149)
VLDL CHOLESTEROL CAL: 34 mg/dL (ref 5–40)

## 2013-06-13 LAB — BRAIN NATRIURETIC PEPTIDE: BNP: 107.7 pg/mL — ABNORMAL HIGH (ref 0.0–100.0)

## 2013-06-21 ENCOUNTER — Ambulatory Visit (INDEPENDENT_AMBULATORY_CARE_PROVIDER_SITE_OTHER): Payer: Medicare Other | Admitting: Cardiovascular Disease

## 2013-06-21 ENCOUNTER — Encounter: Payer: Self-pay | Admitting: Cardiovascular Disease

## 2013-06-21 VITALS — BP 128/72 | HR 83 | Ht 60.0 in | Wt 154.0 lb

## 2013-06-21 DIAGNOSIS — E785 Hyperlipidemia, unspecified: Secondary | ICD-10-CM

## 2013-06-21 DIAGNOSIS — I6350 Cerebral infarction due to unspecified occlusion or stenosis of unspecified cerebral artery: Secondary | ICD-10-CM | POA: Diagnosis not present

## 2013-06-21 DIAGNOSIS — I10 Essential (primary) hypertension: Secondary | ICD-10-CM | POA: Diagnosis not present

## 2013-06-21 NOTE — Progress Notes (Signed)
06/21/2013 Kristin BullocksAnn T Wilkerson   [birth year]  [id number]  Primary Physician GREEN, Lenon CurtARTHUR G, MD Primary Cardiologist: Runell GessJonathan J. Katriel Cutsforth MD Roseanne RenoFACP,FACC,FAHA, FSCAI   HPI:  80 year old female patient followed by me for hypertension and hyperlipidemia she had echo in 2013 and her last stress test was 2011 which was negative for ischemia. Teflon stress with her husband's medical issues as well. Today she presents with complaints of racing heart rate or heart when she awakens at 4 AM is racing she can go back to sleep easily but it will continue to race. She was on a new medication her arthritis after having her knee injected she thinks that started the tachycardia but it continues over a month after stopping the medication. She is now on Cymbalta to help with her myalgias as well.  After she discussed her racing heart rate she mentioned that she was also having arthritis in her chest as well as left arm arthritis. Her EKG was without acute changes she was tender to touch in left anterior chest and in the left arm where she complained of the pain. She is very anxious that the pain could be cardiac she thinks is arthritis but is worried that it could be her heart.  She was seen by Nada BoozerLaura Ingold registered nurse partition her on 12/10/12 with complaints of atypical chest pain and heart racing. A Myoview stress test was entirely normal. An event monitor showed sinus rhythm/sinus tachycardia. She admits to being a highly anxious person currently on Cymbalta per her PCP Dr. Chilton SiGreen.since I saw her 6 months ago she's been clinically stable specifically denies chest pain or shortness of breath. She is anxious about her declining health of her husband.     Current Outpatient Prescriptions  Medication Sig Dispense Refill  . atorvastatin (LIPITOR) 20 MG tablet One each evening to control cholesterol  90 tablet  3  . Calcium Carbonate-Vitamin D (CALCIUM 600+D) 600-400 MG-UNIT per tablet Take one tablet by mouth twice daily       . cholecalciferol (VITAMIN D) 1000 UNITS tablet Take one tablet once daily for vitamin d      . clopidogrel (PLAVIX) 75 MG tablet One each morning for anticoagulation  90 tablet  3  . DULoxetine (CYMBALTA) 30 MG capsule One daily  To help pains and nerves.  90 capsule  3  . furosemide (LASIX) 40 MG tablet One daily to control edema  30 tablet  3  . latanoprost (XALATAN) 0.005 % ophthalmic solution Instill one drop to both eyes at bedtime to treat glaucoma  7.5 mL  4  . LORazepam (ATIVAN) 1 MG tablet Take 1 tablet (1 mg total) by mouth 2 (two) times daily as needed for anxiety.  30 tablet  3  . losartan (COZAAR) 100 MG tablet Take one tablet once daily for blood pressure  90 tablet  3  . metoprolol (LOPRESSOR) 100 MG tablet One twice daily to control BP  180 tablet  3   No current facility-administered medications for this visit.    No Known Allergies  History   Social History  . Marital Status: Married    Spouse Name: N/A    Number of Children: 3  . Years of Education: 12th   Occupational History  . retired    Social History Main Topics  . Smoking status: Never Smoker   . Smokeless tobacco: Not on file  . Alcohol Use: No  . Drug Use: No  . Sexual Activity: No  Other Topics Concern  . Not on file   Social History Narrative  . No narrative on file     Review of Systems: General: negative for chills, fever, night sweats or weight changes.  Cardiovascular: negative for chest pain, dyspnea on exertion, edema, orthopnea, palpitations, paroxysmal nocturnal dyspnea or shortness of breath Dermatological: negative for rash Respiratory: negative for cough or wheezing Urologic: negative for hematuria Abdominal: negative for nausea, vomiting, diarrhea, bright red blood per rectum, melena, or hematemesis Neurologic: negative for visual changes, syncope, or dizziness All other systems reviewed and are otherwise negative except as noted above.    Blood pressure 128/72, pulse  83, height 5' (1.524 m), weight 69.854 kg (154 lb).  General appearance: alert and no distress Neck: no adenopathy, no carotid bruit, no JVD, supple, symmetrical, trachea midline and thyroid not enlarged, symmetric, no tenderness/mass/nodules Lungs: clear to auscultation bilaterally Heart: regular rate and rhythm, S1, S2 normal, no murmur, click, rub or gallop Extremities: extremities normal, atraumatic, no cyanosis or edema  EKG normal sinus rhythm at 83 with left axis deviation and reversal of progression suggesting old anterior infarct  ASSESSMENT AND PLAN:   HTN (hypertension) Controlled on current medications  HLD (hyperlipidemia) On statin therapy followed by her PCP      Runell Gess MD [facility], [facility] 06/21/2013 4:51 PM

## 2013-06-21 NOTE — Assessment & Plan Note (Signed)
Controlled on current medications 

## 2013-06-21 NOTE — Assessment & Plan Note (Signed)
On statin therapy followed by her PCP 

## 2013-06-21 NOTE — Patient Instructions (Signed)
Your physician wants you to follow-up in: 6 months with Dr Berry. You will receive a reminder letter in the mail two months in advance. If you don't receive a letter, please call our office to schedule the follow-up appointment.  

## 2013-06-25 DIAGNOSIS — J209 Acute bronchitis, unspecified: Secondary | ICD-10-CM | POA: Diagnosis not present

## 2013-07-02 ENCOUNTER — Ambulatory Visit (INDEPENDENT_AMBULATORY_CARE_PROVIDER_SITE_OTHER): Payer: Medicare Other | Admitting: Internal Medicine

## 2013-07-02 ENCOUNTER — Encounter: Payer: Self-pay | Admitting: Internal Medicine

## 2013-07-02 ENCOUNTER — Encounter: Payer: Self-pay | Admitting: *Deleted

## 2013-07-02 VITALS — BP 130/78 | HR 88 | Temp 99.1°F | Resp 18 | Ht 60.0 in | Wt 155.8 lb

## 2013-07-02 DIAGNOSIS — G4700 Insomnia, unspecified: Secondary | ICD-10-CM

## 2013-07-02 DIAGNOSIS — R609 Edema, unspecified: Secondary | ICD-10-CM

## 2013-07-02 DIAGNOSIS — J209 Acute bronchitis, unspecified: Secondary | ICD-10-CM

## 2013-07-02 DIAGNOSIS — I6350 Cerebral infarction due to unspecified occlusion or stenosis of unspecified cerebral artery: Secondary | ICD-10-CM | POA: Diagnosis not present

## 2013-07-02 DIAGNOSIS — I5030 Unspecified diastolic (congestive) heart failure: Secondary | ICD-10-CM | POA: Diagnosis not present

## 2013-07-02 DIAGNOSIS — I509 Heart failure, unspecified: Secondary | ICD-10-CM

## 2013-07-02 DIAGNOSIS — I10 Essential (primary) hypertension: Secondary | ICD-10-CM | POA: Diagnosis not present

## 2013-07-02 DIAGNOSIS — I2789 Other specified pulmonary heart diseases: Secondary | ICD-10-CM

## 2013-07-02 DIAGNOSIS — I272 Pulmonary hypertension, unspecified: Secondary | ICD-10-CM

## 2013-07-02 MED ORDER — CALCIUM CARBONATE-VITAMIN D 600-400 MG-UNIT PO TABS: ORAL_TABLET | ORAL | Status: DC

## 2013-07-02 NOTE — Patient Instructions (Signed)
Continue current medications. 

## 2013-07-02 NOTE — Progress Notes (Signed)
Patient ID: Kristin Wilkerson, female   DOB: 02/22/1926, 80 y.o.   MRN: [id number]    Location:    [facility]  Place of Service:  OFFICE   No Known Allergies  Chief Complaint  Patient presents with  . Follow-up    HPI:  Edema: Improved, but still present. Amlodipine was stopped. She has a hx of pulmonary HTN which could be associated with right sided heart failure and swollen legs.   Diastolic CHF: last BNP was about 100  HTN (hypertension): controlled  Acute bronchitis: seen at [facility]gent [facility]. Finished antibioticsw. Still with bronchial rattle. Improved  Pulmonary hypertension; by history  Insomnia, unspecified: improved on lorazepam   Medications: Patient's Medications  New Prescriptions   No medications on file  Previous Medications   ATORVASTATIN (LIPITOR) 20 MG TABLET    One each evening to control cholesterol   CALCIUM CARBONATE-VITAMIN D (CALCIUM 600+D) 600-400 MG-UNIT PER TABLET    Take one tablet by mouth twice daily   CHOLECALCIFEROL (VITAMIN D) 1000 UNITS TABLET    Take one tablet once daily for vitamin d   CLOPIDOGREL (PLAVIX) 75 MG TABLET    One each morning for anticoagulation   DULOXETINE (CYMBALTA) 30 MG CAPSULE    One daily  To help pains and nerves.   FUROSEMIDE (LASIX) 40 MG TABLET    One daily to control edema   LATANOPROST (XALATAN) 0.005 % OPHTHALMIC SOLUTION    Instill one drop to both eyes at bedtime to treat glaucoma   LORAZEPAM (ATIVAN) 1 MG TABLET    Take 1 tablet (1 mg total) by mouth 2 (two) times daily as needed for anxiety.   LOSARTAN (COZAAR) 100 MG TABLET    Take one tablet once daily for blood pressure   METOPROLOL (LOPRESSOR) 100 MG TABLET    One twice daily to control BP  Modified Medications   No medications on file  Discontinued Medications   No medications on file     Review of Systems  Constitutional: Negative.   HENT: Positive for congestion.   Eyes:       Right hemianopsia  Respiratory: Positive for cough, shortness of breath and  wheezing.   Cardiovascular: Positive for leg swelling.  Gastrointestinal: Negative.   Endocrine: Negative.   Genitourinary: Positive for urgency and frequency.       Nocturia  Musculoskeletal: Positive for arthralgias and myalgias.  Skin: Negative for rash.  Allergic/Immunologic: Negative.   Neurological:       Tingling sensations in lower extremities. TIA/CVA 03/07/2013. Has had resolution of the decreased vision in the right eye and the left facial droop.  Hematological: Negative.   Psychiatric/Behavioral: Negative.     Filed Vitals:   07/02/13 1523  BP: 130/78  Pulse: 88  Temp: 99.1 F (37.3 C)  TempSrc: Oral  Resp: 18  Height: 5' (1.524 m)  Weight: 155 lb 12.8 oz (70.67 kg)  SpO2: 94%   Physical Exam  Constitutional: She is oriented to person, place, and time. She appears well-developed and well-nourished. No distress.  HENT:  Head: Normocephalic.  Right Ear: External ear normal.  Left Ear: External ear normal.  Nose: Nose normal.  Eyes: Conjunctivae and EOM are normal. Pupils are equal, round, and reactive to light.  Mild right hemianopsia laterally  Neck: Normal range of motion. Neck supple. No JVD present. No tracheal deviation present. No thyromegaly present.  Cardiovascular: Exam reveals no distant heart sounds and no friction rub.   Murmur heard.  Systolic murmur  is present with a grade of 2/6  Holosystolic  Pulmonary/Chest: No respiratory distress. She has no wheezes. She has rales. She exhibits no tenderness.  Bronchial rattle  Abdominal: Soft. Bowel sounds are normal. She exhibits no distension and no mass. There is no tenderness.  Musculoskeletal: Normal range of motion. She exhibits edema. She exhibits no tenderness.  Lymphadenopathy:    She has no cervical adenopathy.  Neurological: She is alert and oriented to person, place, and time. She has normal reflexes.  Skin: Skin is warm and dry. No rash noted. No erythema. No pallor.  Psychiatric: She has a  normal mood and affect. Her behavior is normal. Judgment and thought content normal.     Labs reviewed: Office Visit on 06/12/2013  Component Date Value Ref Range Status  . Glucose 06/12/2013 86  65 - 99 mg/dL Final  . BUN 53/66/4403 19  8 - 27 mg/dL Final  . Creatinine, Ser 06/12/2013 0.94  0.57 - 1.00 mg/dL Final  . GFR calc non Af Amer 06/12/2013 55* >59 mL/min/1.73 Final  . GFR calc Af Amer 06/12/2013 64  >59 mL/min/1.73 Final  . BUN/Creatinine Ratio 06/12/2013 20  11 - 26 Final  . Sodium 06/12/2013 143  134 - 144 mmol/L Final  . Potassium 06/12/2013 4.9  3.5 - 5.2 mmol/L Final  . Chloride 06/12/2013 102  97 - 108 mmol/L Final  . CO2 06/12/2013 27  18 - 29 mmol/L Final  . Calcium 06/12/2013 10.1  8.7 - 10.3 mg/dL Final  . BNP 47/42/5956 107.7* 0.0 - 100.0 pg/mL Final  . Cholesterol, Total 06/12/2013 161  100 - 199 mg/dL Final  . Triglycerides 06/12/2013 168* 0 - 149 mg/dL Final  . HDL 38/75/6433 42  >39 mg/dL Final   Comment: According to ATP-III Guidelines, HDL-C >59 mg/dL is considered a                          negative risk factor for CHD.  [name] VLDL Cholesterol Cal 06/12/2013 34  5 - 40 mg/dL Final  . LDL Calculated 06/12/2013 85  0 - 99 mg/dL Final  . Chol/HDL Ratio 06/12/2013 3.8  0.0 - 4.4 ratio units Final   Comment:                                   T. Chol/HDL Ratio                                                                      Men  Women                                                        1/2 Avg.Risk  3.4    3.3  Avg.Risk  5.0    4.4                                                         2X Avg.Risk  9.6    7.1                                                         3X Avg.Risk 23.4   11.0      Assessment/Plan  1. Edema improved  2. Diastolic CHF stable  3. HTN (hypertension) controlled  4. Acute bronchitis improved  5. Pulmonary hypertension Stable. May contribute to edema  6.  Insomnia, unspecified improved

## 2013-07-09 DIAGNOSIS — H3530 Unspecified macular degeneration: Secondary | ICD-10-CM | POA: Diagnosis not present

## 2013-07-09 DIAGNOSIS — Z961 Presence of intraocular lens: Secondary | ICD-10-CM | POA: Diagnosis not present

## 2013-07-18 DIAGNOSIS — M1710 Unilateral primary osteoarthritis, unspecified knee: Secondary | ICD-10-CM | POA: Diagnosis not present

## 2013-07-18 DIAGNOSIS — IMO0002 Reserved for concepts with insufficient information to code with codable children: Secondary | ICD-10-CM | POA: Diagnosis not present

## 2013-07-24 ENCOUNTER — Ambulatory Visit: Payer: Medicare Other | Admitting: Internal Medicine

## 2013-08-08 DIAGNOSIS — H26499 Other secondary cataract, unspecified eye: Secondary | ICD-10-CM | POA: Diagnosis not present

## 2013-08-08 DIAGNOSIS — H2640 Unspecified secondary cataract: Secondary | ICD-10-CM | POA: Diagnosis not present

## 2013-08-08 NOTE — Telephone Encounter (Signed)
Closed encounter °

## 2013-08-28 DIAGNOSIS — M1710 Unilateral primary osteoarthritis, unspecified knee: Secondary | ICD-10-CM | POA: Diagnosis not present

## 2013-09-03 ENCOUNTER — Encounter: Payer: Self-pay | Admitting: Internal Medicine

## 2013-09-03 ENCOUNTER — Ambulatory Visit (INDEPENDENT_AMBULATORY_CARE_PROVIDER_SITE_OTHER): Payer: Medicare Other | Admitting: Internal Medicine

## 2013-09-03 VITALS — BP 128/80 | HR 67 | Temp 98.7°F | Wt 161.0 lb

## 2013-09-03 DIAGNOSIS — I10 Essential (primary) hypertension: Secondary | ICD-10-CM

## 2013-09-03 DIAGNOSIS — R609 Edema, unspecified: Secondary | ICD-10-CM

## 2013-09-03 DIAGNOSIS — I6350 Cerebral infarction due to unspecified occlusion or stenosis of unspecified cerebral artery: Secondary | ICD-10-CM | POA: Diagnosis not present

## 2013-09-03 DIAGNOSIS — R600 Localized edema: Secondary | ICD-10-CM

## 2013-09-03 NOTE — Progress Notes (Signed)
Patient ID: Kristin Wilkerson, female   DOB: 12-25-1925, 80 y.o.   MRN: [id number]    Chief Complaint  Patient presents with  . Acute Visit    elevated bp reading at home, wheezing   No Known Allergies  HPI 80 y/o female patient is here due to elevated bp reading at home for past 2-3 days sbp yesterday was 157 and this am was 160/80 prior to taking her medications. Denies any change in dietary intake recently Denies any headache or change of vision or chest pain or shortness of breath or abdominal pain She lost her husband few months back and is under a lot of stress financially and emotionally. Is able to sleep at night and feels that she is coping with the situation fine. Has good support system with her children in town. She felt that she was wheezing few days back. She also noticed her legs to be swollen few weeks back and now it has increased mainly around her ankles She has hx of chf and was on lasix but has not taken it for 1 month for unclear reason  ROS See hpi Has gained 6 lbs since last visit.    Past Medical History  Diagnosis Date  . Hypertension   . Abnormality of gait   . Disturbance of skin sensation   . Urinary frequency   . Tachycardia, unspecified   . Internal hemorrhoids without mention of complication   . Unspecified pruritic disorder   . Insomnia, unspecified   . Synovial cyst of popliteal space   . Spinal stenosis, other region   . Sciatica   . Lumbago   . Unspecified vitamin D deficiency   . Anxiety   . Hyperlipidemia   . Abnormality of gait   . Unspecified sinusitis (chronic)   . Unspecified pruritic disorder   . Unspecified viral infection, in conditions classified elsewhere and of unspecified site   . Pain in joint, lower leg   . Depressive disorder, not elsewhere classified   . Unspecified glaucoma   . Reflux esophagitis   . Diverticulosis of colon (without mention of hemorrhage)   . Pain in joint, site unspecified   . Myalgia and myositis,  unspecified   . Osteoporosis, unspecified   . Other malaise and fatigue   . Cervicalgia   . Senile osteoporosis   . Benign neoplasm of colon   . H/O echocardiogram 09/01/11    EF >55%, mild MR, RV systolic pressure elevated at 40-9830-40 mmHg, mod TR  . Normal cardiac stress test 11/16/09    low risk scan, EF  77%  . Stroke   . Shortness of breath    Past Surgical History  Procedure Laterality Date  . Lumbar laminectomy  12/04/2012    Complete decompressive L3-4 and L4-5--Dr. [name]  . Abdominal hysterectomy    . Cholecystectomy     Current Outpatient Prescriptions on File Prior to Visit  Medication Sig Dispense Refill  . atorvastatin (LIPITOR) 20 MG tablet One each evening to control cholesterol  90 tablet  3  . Calcium Carbonate-Vitamin D (CALCIUM 600+D) 600-400 MG-UNIT per tablet Take one tablet by mouth daily for calcium supplement  30 tablet  5  . cholecalciferol (VITAMIN D) 1000 UNITS tablet Take one tablet once daily for vitamin d      . clopidogrel (PLAVIX) 75 MG tablet One each morning for anticoagulation  90 tablet  3  . DULoxetine (CYMBALTA) 30 MG capsule One daily  To help pains and nerves.  90 capsule  3  . latanoprost (XALATAN) 0.005 % ophthalmic solution Instill one drop to both eyes at bedtime to treat glaucoma  7.5 mL  4  . LORazepam (ATIVAN) 1 MG tablet Take 1 tablet (1 mg total) by mouth 2 (two) times daily as needed for anxiety.  30 tablet  3  . losartan (COZAAR) 100 MG tablet Take one tablet once daily for blood pressure  90 tablet  3  . metoprolol (LOPRESSOR) 100 MG tablet One twice daily to control BP  180 tablet  3  . furosemide (LASIX) 40 MG tablet One daily to control edema  30 tablet  3   No current facility-administered medications on file prior to visit.    Physical exam BP 128/80  Pulse 67  Temp(Src) 98.7 F (37.1 C) (Oral)  Wt 161 lb (73.029 kg)  SpO2 96%  General- elderly female in no acute distress Head- atraumatic, normocephalic Eyes- PERRLA,  EOMI, no pallor Neck- no lymphadenopathy Mouth- normal mucus membrane Cardiovascular- normal s1,s2, no murmurs/ rubs/ gallops, normal distal pulses Respiratory- bilateral clear to auscultation, no wheeze, no rhonchi, no crackles Abdomen- bowel sounds present, soft, non tender Musculoskeletal- able to move all 4 extremities, 1+ leg edema Neurological- no focal deficit Skin- warm and dry Psychiatry- alert and oriented to person, place and time, normal mood and affect   Lab Results  Component Value Date   CREATININE 0.94 06/12/2013   Assessment/plan  1. HTN (hypertension) Elevated bp reading at home but normal bp reading in office. Bp at home was prior to taking bp med, this along with her stress could be causing it to rise. Monitor clinically pt advised to check bp on daily basis and have it review with dr green on their next appointment. Warning signs with elevated bp explained. Continue losartan and lopressor current regimen  2. Leg edema Has stopped lasix by herself. Will retstart lasix at 40 mg daily for now monitor bmp. Monitor daily weight

## 2013-09-20 ENCOUNTER — Telehealth: Payer: Self-pay | Admitting: Cardiovascular Disease

## 2013-09-20 DIAGNOSIS — S7000XA Contusion of unspecified hip, initial encounter: Secondary | ICD-10-CM | POA: Diagnosis not present

## 2013-09-20 DIAGNOSIS — S20219A Contusion of unspecified front wall of thorax, initial encounter: Secondary | ICD-10-CM | POA: Diagnosis not present

## 2013-09-20 NOTE — Telephone Encounter (Signed)
Closed encounter °

## 2013-09-25 ENCOUNTER — Encounter: Payer: Self-pay | Admitting: Internal Medicine

## 2013-09-25 ENCOUNTER — Ambulatory Visit (INDEPENDENT_AMBULATORY_CARE_PROVIDER_SITE_OTHER): Payer: Medicare Other | Admitting: Internal Medicine

## 2013-09-25 VITALS — BP 116/62 | HR 66 | Temp 97.6°F | Wt 160.0 lb

## 2013-09-25 DIAGNOSIS — R071 Chest pain on breathing: Secondary | ICD-10-CM | POA: Diagnosis not present

## 2013-09-25 DIAGNOSIS — M25559 Pain in unspecified hip: Secondary | ICD-10-CM

## 2013-09-25 DIAGNOSIS — I6350 Cerebral infarction due to unspecified occlusion or stenosis of unspecified cerebral artery: Secondary | ICD-10-CM | POA: Diagnosis not present

## 2013-09-25 DIAGNOSIS — M25519 Pain in unspecified shoulder: Secondary | ICD-10-CM

## 2013-09-25 DIAGNOSIS — M25512 Pain in left shoulder: Secondary | ICD-10-CM

## 2013-09-25 DIAGNOSIS — R0789 Other chest pain: Secondary | ICD-10-CM

## 2013-09-25 DIAGNOSIS — M25552 Pain in left hip: Secondary | ICD-10-CM

## 2013-09-25 MED ORDER — NAPROXEN 500 MG PO TABS: 500.0000 mg | ORAL_TABLET | Freq: Two times a day (BID) | ORAL | Status: DC

## 2013-09-25 NOTE — Progress Notes (Signed)
Patient ID: Kristin Wilkerson, female   DOB: Feb 16, 1926, 80 y.o.   MRN: [id number]    No Known Allergies  Chief Complaint  Patient presents with  . Fall    A few weeks ago pt fell on her left side. Went to [facility] on [address]. Still sore  in left shoulder.   HPI She was sitting on her toilet and staring when she slipped on the left side and fell in between the toilet and the tub around 2 weeks back. She hurt her buttock and has a bruise now. She tends to bruise easily She complaints of being sore on her left shoulder and left hip area. Se was given ibuprofen for pain but did not take it She went to [facility] a couple of days later on 09/20/13 and was noted to have chest wall contusion and contusion in hip. Xray ruled out rib and hip fracture.  ROS No further fall Chest wall soreness on left side with movement No dyspnea No bowel/ bladder complaints  Past Medical History  Diagnosis Date  . Hypertension   . Abnormality of gait   . Disturbance of skin sensation   . Urinary frequency   . Tachycardia, unspecified   . Internal hemorrhoids without mention of complication   . Unspecified pruritic disorder   . Insomnia, unspecified   . Synovial cyst of popliteal space   . Spinal stenosis, other region   . Sciatica   . Lumbago   . Unspecified vitamin D deficiency   . Anxiety   . Hyperlipidemia   . Abnormality of gait   . Unspecified sinusitis (chronic)   . Unspecified pruritic disorder   . Unspecified viral infection, in conditions classified elsewhere and of unspecified site   . Pain in joint, lower leg   . Depressive disorder, not elsewhere classified   . Unspecified glaucoma   . Reflux esophagitis   . Diverticulosis of colon (without mention of hemorrhage)   . Pain in joint, site unspecified   . Myalgia and myositis, unspecified   . Osteoporosis, unspecified   . Other malaise and fatigue   . Cervicalgia   . Senile osteoporosis   . Benign neoplasm of colon   .  H/O echocardiogram 09/01/11    EF >55%, mild MR, RV systolic pressure elevated at 10-31 mmHg, mod TR  . Normal cardiac stress test 11/16/09    low risk scan, EF  77%  . Stroke   . Shortness of breath    Medication reviewed. See [facility]  Physical exam BP 116/62  Pulse 66  Temp(Src) 97.6 F (36.4 C) (Oral)  Wt 160 lb (72.576 kg)  SpO2 96%  gen- elderly female in NAD HEENT- no pallor or icterus cvs- normal s1,s2, rrr respi-CTAB Chest wall- tenderness with palpation, np bruise noted Left shoulder- overhead abduction limited but otherwise decent ROM Skin- bruise on sacral and left buttock area, resolving, tender on sacral area and ischial area on palpation   Lab Results  Component Value Date   CREATININE 0.94 06/12/2013   Assessment/plan  1. Left shoulder pain Post trauma. Will have her on naproxen 500 mg bid for a week with rest and ice pack prn  2. Left hip pain Same as above  3. Left-sided chest wall pain Post contusion- advised to take her pain medication

## 2013-09-26 ENCOUNTER — Ambulatory Visit: Payer: Medicare Other | Admitting: Cardiovascular Disease

## 2013-09-26 ENCOUNTER — Encounter: Payer: Self-pay | Admitting: Cardiovascular Disease

## 2013-10-01 ENCOUNTER — Ambulatory Visit (INDEPENDENT_AMBULATORY_CARE_PROVIDER_SITE_OTHER): Payer: Medicare Other | Admitting: Cardiovascular Disease

## 2013-10-01 ENCOUNTER — Encounter: Payer: Self-pay | Admitting: Cardiovascular Disease

## 2013-10-01 VITALS — BP 124/76 | HR 68 | Ht 60.0 in | Wt 158.0 lb

## 2013-10-01 DIAGNOSIS — I10 Essential (primary) hypertension: Secondary | ICD-10-CM | POA: Diagnosis not present

## 2013-10-01 DIAGNOSIS — I6350 Cerebral infarction due to unspecified occlusion or stenosis of unspecified cerebral artery: Secondary | ICD-10-CM | POA: Diagnosis not present

## 2013-10-01 DIAGNOSIS — I5030 Unspecified diastolic (congestive) heart failure: Secondary | ICD-10-CM

## 2013-10-01 DIAGNOSIS — E785 Hyperlipidemia, unspecified: Secondary | ICD-10-CM | POA: Diagnosis not present

## 2013-10-01 DIAGNOSIS — I509 Heart failure, unspecified: Secondary | ICD-10-CM | POA: Diagnosis not present

## 2013-10-01 DIAGNOSIS — I639 Cerebral infarction, unspecified: Secondary | ICD-10-CM

## 2013-10-01 NOTE — Patient Instructions (Signed)
We request that you follow-up in: 6 months with an extender and in 1 year with Dr Berry  You will receive a reminder letter in the mail two months in advance. If you don't receive a letter, please call our office to schedule the follow-up appointment.   

## 2013-10-01 NOTE — Assessment & Plan Note (Signed)
On statin therapy with recent lipid profile performed 06/12/13 revealing an LDL of 85 and HDL of 42

## 2013-10-01 NOTE — Progress Notes (Signed)
10/01/2013 Kristin BullocksAnn T Haws   April 22, 1925  [id number]  Primary Physician GREEN, Lenon CurtARTHUR G, MD Primary Cardiologist: Runell GessJonathan J. Berry MD Roseanne RenoFACP,FACC,FAHA, FSCAI   HPI:  80 year old female patient followed by me for hypertension and hyperlipidemia she had echo in 2013 and her last stress test was 2011 which was negative for ischemia. Teflon stress with her husband's medical issues as well. Today she presents with complaints of racing heart rate or heart when she awakens at 4 AM is racing she can go back to sleep easily but it will continue to race. She was on a new medication her arthritis after having her knee injected she thinks that started the tachycardia but it continues over a month after stopping the medication. She is now on Cymbalta to help with her myalgias as well.  After she discussed her racing heart rate she mentioned that she was also having arthritis in her chest as well as left arm arthritis. Her EKG was without acute changes she was tender to touch in left anterior chest and in the left arm where she complained of the pain. She is very anxious that the pain could be cardiac she thinks is arthritis but is worried that it could be her heart.  She was seen by Nada BoozerLaura Ingold registered nurse partition her on 12/10/12 with complaints of atypical chest pain and heart racing. A Myoview stress test was entirely normal. An event monitor showed sinus rhythm/sinus tachycardia. She admits to being a highly anxious person currently on Cymbalta per her PCP . Since I saw her 3 months ago her husband [name] passed away on 07/16/13 and she is appropriately grieving.. She still has atypical chest pain which I do not think is cardiac.   Current Outpatient Prescriptions  Medication Sig Dispense Refill  . atorvastatin (LIPITOR) 20 MG tablet One each evening to control cholesterol  90 tablet  3  . Calcium Carbonate-Vitamin D (CALCIUM 600+D) 600-400 MG-UNIT per tablet Take one tablet by mouth daily for calcium  supplement  30 tablet  5  . cholecalciferol (VITAMIN D) 1000 UNITS tablet Take one tablet once daily for vitamin d      . clopidogrel (PLAVIX) 75 MG tablet One each morning for anticoagulation  90 tablet  3  . DULoxetine (CYMBALTA) 30 MG capsule One daily  To help pains and nerves.  90 capsule  3  . furosemide (LASIX) 40 MG tablet One daily to control edema  30 tablet  3  . latanoprost (XALATAN) 0.005 % ophthalmic solution Instill one drop to both eyes at bedtime to treat glaucoma  7.5 mL  4  . LORazepam (ATIVAN) 1 MG tablet Take 1 tablet (1 mg total) by mouth 2 (two) times daily as needed for anxiety.  30 tablet  3  . losartan (COZAAR) 100 MG tablet Take one tablet once daily for blood pressure  90 tablet  3  . metoprolol (LOPRESSOR) 100 MG tablet One twice daily to control BP  180 tablet  3  . naproxen (NAPROSYN) 500 MG tablet Take 500 mg by mouth as needed. For pain       No current facility-administered medications for this visit.    No Known Allergies  History   Social History  . Marital Status: Married    Spouse Name: N/A    Number of Children: 3  . Years of Education: 12th   Occupational History  . retired    Social History Main Topics  . Smoking status: Never Smoker   .  Smokeless tobacco: Not on file  . Alcohol Use: No  . Drug Use: No  . Sexual Activity: No   Other Topics Concern  . Not on file   Social History Narrative  . No narrative on file     Review of Systems: General: negative for chills, fever, night sweats or weight changes.  Cardiovascular: negative for chest pain, dyspnea on exertion, edema, orthopnea, palpitations, paroxysmal nocturnal dyspnea or shortness of breath Dermatological: negative for rash Respiratory: negative for cough or wheezing Urologic: negative for hematuria Abdominal: negative for nausea, vomiting, diarrhea, bright red blood per rectum, melena, or hematemesis Neurologic: negative for visual changes, syncope, or dizziness All  other systems reviewed and are otherwise negative except as noted above.    Blood pressure 124/76, pulse 68, height 5' (1.524 m), weight 158 lb (71.668 kg).  General appearance: alert and no distress Neck: no adenopathy, no carotid bruit, no JVD, supple, symmetrical, trachea midline and thyroid not enlarged, symmetric, no tenderness/mass/nodules Lungs: clear to auscultation bilaterally Heart: regular rate and rhythm, S1, S2 normal, no murmur, click, rub or gallop Extremities: extremities normal, atraumatic, no cyanosis or edema  EKG normal sinus rhythm at 68 with nonspecific ST and T wave changes  ASSESSMENT AND PLAN:   HTN (hypertension) Controlled on current medications  HLD (hyperlipidemia) On statin therapy with recent lipid profile performed 06/12/13 revealing an LDL of 85 and HDL of 42      Runell GessJonathan J. Berry MD [facility]moreFACP,FACC,FAHA, [facility] LPFSCAI 10/01/2013 4:26 PM

## 2013-10-01 NOTE — Assessment & Plan Note (Signed)
Controlled on current medications 

## 2013-10-03 DIAGNOSIS — M1710 Unilateral primary osteoarthritis, unspecified knee: Secondary | ICD-10-CM | POA: Diagnosis not present

## 2013-10-07 ENCOUNTER — Other Ambulatory Visit: Payer: Self-pay | Admitting: Internal Medicine

## 2013-10-11 ENCOUNTER — Ambulatory Visit: Payer: Medicare Other | Admitting: Cardiovascular Disease

## 2013-10-15 ENCOUNTER — Ambulatory Visit: Payer: Medicare Other | Admitting: Internal Medicine

## 2013-10-23 ENCOUNTER — Encounter: Payer: Self-pay | Admitting: Internal Medicine

## 2013-10-23 ENCOUNTER — Ambulatory Visit (INDEPENDENT_AMBULATORY_CARE_PROVIDER_SITE_OTHER): Payer: Medicare Other | Admitting: Internal Medicine

## 2013-10-23 VITALS — BP 120/72 | HR 76 | Temp 97.7°F | Wt 157.0 lb

## 2013-10-23 DIAGNOSIS — S90129A Contusion of unspecified lesser toe(s) without damage to nail, initial encounter: Secondary | ICD-10-CM | POA: Diagnosis not present

## 2013-10-23 DIAGNOSIS — S90122A Contusion of left lesser toe(s) without damage to nail, initial encounter: Secondary | ICD-10-CM

## 2013-10-23 DIAGNOSIS — I6350 Cerebral infarction due to unspecified occlusion or stenosis of unspecified cerebral artery: Secondary | ICD-10-CM | POA: Diagnosis not present

## 2013-10-23 DIAGNOSIS — IMO0002 Reserved for concepts with insufficient information to code with codable children: Secondary | ICD-10-CM | POA: Diagnosis not present

## 2013-10-23 DIAGNOSIS — I5032 Chronic diastolic (congestive) heart failure: Secondary | ICD-10-CM | POA: Diagnosis not present

## 2013-10-23 DIAGNOSIS — L84 Corns and callosities: Secondary | ICD-10-CM

## 2013-10-23 DIAGNOSIS — F411 Generalized anxiety disorder: Secondary | ICD-10-CM

## 2013-10-23 DIAGNOSIS — I509 Heart failure, unspecified: Secondary | ICD-10-CM

## 2013-10-23 DIAGNOSIS — S90812A Abrasion, left foot, initial encounter: Secondary | ICD-10-CM

## 2013-10-23 DIAGNOSIS — I10 Essential (primary) hypertension: Secondary | ICD-10-CM | POA: Diagnosis not present

## 2013-10-23 DIAGNOSIS — R5381 Other malaise: Secondary | ICD-10-CM

## 2013-10-23 DIAGNOSIS — F419 Anxiety disorder, unspecified: Secondary | ICD-10-CM

## 2013-10-23 DIAGNOSIS — E785 Hyperlipidemia, unspecified: Secondary | ICD-10-CM

## 2013-10-23 DIAGNOSIS — R5383 Other fatigue: Secondary | ICD-10-CM

## 2013-10-23 NOTE — Progress Notes (Signed)
Patient ID: Kristin Wilkerson, female   DOB: [birth year], 80 y.o.   MRN: [id number]    Location:    [facility]  Place of Service:  OFFICE    No Known Allergies  Chief Complaint  Patient presents with  . Follow-up    Patient here today for follow-up appointment    HPI:  Contusion of fifth toe of left foot, initial encounter: mild tenderness at mtp joint. Dark ecchymosis at MTP joint. No deformity or dislocation.  Abrasion of foot, left, initial encounter: about a week old on dorsum of the distal left foot. Scratches at it.  Chronic diastolic congestive heart failure: compensated  Essential hypertension - controlled  HLD (hyperlipidemia) - repeat lab next visit    Medications: Patient's Medications  New Prescriptions   No medications on file  Previous Medications   ATORVASTATIN (LIPITOR) 20 MG TABLET    One each evening to control cholesterol   CALCIUM CARBONATE-VITAMIN D (CALCIUM 600+D) 600-400 MG-UNIT PER TABLET    Take one tablet by mouth daily for calcium supplement   CHOLECALCIFEROL (VITAMIN D) 1000 UNITS TABLET    Take one tablet once daily for vitamin d   CLOPIDOGREL (PLAVIX) 75 MG TABLET    One each morning for anticoagulation   DULOXETINE (CYMBALTA) 30 MG CAPSULE    One daily  To help pains and nerves.   FUROSEMIDE (LASIX) 40 MG TABLET    TAKE 1 TABLET BY MOUTH EVERY DAY TO CONTROL EDEMA   LATANOPROST (XALATAN) 0.005 % OPHTHALMIC SOLUTION    Instill one drop to both eyes at bedtime to treat glaucoma   LORAZEPAM (ATIVAN) 1 MG TABLET    Take 1 tablet (1 mg total) by mouth 2 (two) times daily as needed for anxiety.   LOSARTAN (COZAAR) 100 MG TABLET    Take one tablet once daily for blood pressure   METOPROLOL (LOPRESSOR) 100 MG TABLET    One twice daily to control BP   NAPROXEN (NAPROSYN) 500 MG TABLET    Take 500 mg by mouth as needed. For pain  Modified Medications   No medications on file  Discontinued Medications   No medications on file     Review of Systems    Constitutional: Negative.   HENT: Positive for congestion.   Eyes:       Right hemianopsia  Respiratory: Positive for shortness of breath. Negative for cough and wheezing.   Cardiovascular: Positive for leg swelling.  Gastrointestinal: Negative.   Endocrine: Negative.   Genitourinary: Positive for urgency and frequency.       Nocturia  Musculoskeletal: Positive for arthralgias and myalgias.  Skin: Negative for rash.       New contusion of the left 5th toe and abrasion of the left foot dorsum.  Allergic/Immunologic: Negative.   Neurological:       Tingling sensations in lower extremities. TIA/CVA 03/07/2013. Has had resolution of the decreased vision in the right eye and the left facial droop.  Hematological: Negative.   Psychiatric/Behavioral: Negative.     Filed Vitals:   10/23/13 1250  BP: 120/72  Pulse: 76  Temp: 97.7 F (36.5 C)  TempSrc: Oral  Weight: 157 lb (71.215 kg)  SpO2: 94%   Body mass index is 30.66 kg/(m^2).  Physical Exam  Constitutional: She is oriented to person, place, and time. She appears well-developed and well-nourished. No distress.  HENT:  Head: Normocephalic.  Right Ear: External ear normal.  Left Ear: External ear normal.  Nose: Nose normal.  Eyes: Conjunctivae and EOM are normal. Pupils are equal, round, and reactive to light.  Mild right hemianopsia laterally  Neck: Normal range of motion. Neck supple. No JVD present. No tracheal deviation present. No thyromegaly present.  Cardiovascular: Exam reveals no distant heart sounds and no friction rub.   Murmur heard.  Systolic murmur is present with a grade of 2/6  Holosystolic  Pulmonary/Chest: No respiratory distress. She has no wheezes. She has rales. She exhibits no tenderness.  Bronchial rattle  Abdominal: Soft. Bowel sounds are normal. She exhibits no distension and no mass. There is no tenderness.  Musculoskeletal: Normal range of motion. She exhibits edema. She exhibits no tenderness.   Lymphadenopathy:    She has no cervical adenopathy.  Neurological: She is alert and oriented to person, place, and time. She has normal reflexes.  Skin: Skin is warm and dry. No rash noted. No erythema. No pallor.  Ecchymosis of the left 5th toe at MTP joint. Abrasion of the left foot dorsum with some scabs. 2 Corns of the left foot sole.  Psychiatric: She has a normal mood and affect. Her behavior is normal. Judgment and thought content normal.     Labs reviewed: No visits with results within 3 Month(s) from this visit. Latest known visit with results is:  Office Visit on 06/12/2013  Component Date Value Ref Range Status  . Glucose 06/12/2013 86  65 - 99 mg/dL Final  . BUN 16/10/960402/25/2015 19  8 - 27 mg/dL Final  . Creatinine, Ser 06/12/2013 0.94  0.57 - 1.00 mg/dL Final  . GFR calc non Af Amer 06/12/2013 55* >59 mL/min/1.73 Final  . GFR calc Af Amer 06/12/2013 64  >59 mL/min/1.73 Final  . BUN/Creatinine Ratio 06/12/2013 20  11 - 26 Final  . Sodium 06/12/2013 143  134 - 144 mmol/L Final  . Potassium 06/12/2013 4.9  3.5 - 5.2 mmol/L Final  . Chloride 06/12/2013 102  97 - 108 mmol/L Final  . CO2 06/12/2013 27  18 - 29 mmol/L Final  . Calcium 06/12/2013 10.1  8.7 - 10.3 mg/dL Final  . BNP 54/09/811902/25/2015 107.7* 0.0 - 100.0 pg/mL Final  . Cholesterol, Total 06/12/2013 161  100 - 199 mg/dL Final  . Triglycerides 06/12/2013 168* 0 - 149 mg/dL Final  . HDL 14/78/295602/25/2015 42  >39 mg/dL Final   Comment: According to ATP-III Guidelines, HDL-C >59 mg/dL is considered a                          negative risk factor for CHD.  [name]. VLDL Cholesterol Cal 06/12/2013 34  5 - 40 mg/dL Final  . LDL Calculated 06/12/2013 85  0 - 99 mg/dL Final  . Chol/HDL Ratio 06/12/2013 3.8  0.0 - 4.4 ratio units Final   Comment:                                   T. Chol/HDL Ratio                                                                      Men  Women  1/2 Avg.Risk   3.4    3.3                                                            Avg.Risk  5.0    4.4                                                         2X Avg.Risk  9.6    7.1                                                         3X Avg.Risk 23.4   11.0      Assessment/Plan  1. Contusion of fifth toe of left foot, initial encounter No additional xray or treatmentneeded  2. Abrasion of foot, left, initial encounter daily cleanse with soap and water  3. Chronic diastolic congestive heart failure compensated  4. Essential hypertension controlled - Comprehensive metabolic panel; Future  5. HLD (hyperlipidemia) - Lipid panel; Future  6. Corn of foot Sharply debrided 2 corns on the sole of the left foot. Tolerated procedure and felt more comfortable walking afterwards.  7. Anxiety Improved since on Cymbalta  8. Other malaise and fatigue Improved since on Cymbalta

## 2013-10-23 NOTE — Patient Instructions (Signed)
Continue current medications. 

## 2013-11-06 ENCOUNTER — Ambulatory Visit (INDEPENDENT_AMBULATORY_CARE_PROVIDER_SITE_OTHER): Payer: Medicare Other | Admitting: Cardiovascular Disease

## 2013-11-06 ENCOUNTER — Encounter: Payer: Self-pay | Admitting: Cardiovascular Disease

## 2013-11-06 VITALS — BP 140/60 | HR 57 | Ht 61.0 in | Wt 160.4 lb

## 2013-11-06 DIAGNOSIS — I10 Essential (primary) hypertension: Secondary | ICD-10-CM | POA: Diagnosis not present

## 2013-11-06 DIAGNOSIS — E785 Hyperlipidemia, unspecified: Secondary | ICD-10-CM | POA: Diagnosis not present

## 2013-11-06 DIAGNOSIS — M79609 Pain in unspecified limb: Secondary | ICD-10-CM | POA: Diagnosis not present

## 2013-11-06 DIAGNOSIS — I6350 Cerebral infarction due to unspecified occlusion or stenosis of unspecified cerebral artery: Secondary | ICD-10-CM

## 2013-11-06 DIAGNOSIS — M79602 Pain in left arm: Secondary | ICD-10-CM

## 2013-11-06 NOTE — Assessment & Plan Note (Signed)
I just saw Kristin Wilkerson one month ago. She comes in today with 2-3 days of left arm pain which sounds orthostatic. It does change with position. She really denies chest pain. She does have lateral T wave inversion unchanged from prior EKGs. I have reassured her.

## 2013-11-06 NOTE — Assessment & Plan Note (Signed)
On statin therapy with recent lipid profile performed 06/12/13 revealed an LDL of 85 and HDL of 42

## 2013-11-06 NOTE — Progress Notes (Signed)
11/06/2013 Kristin Wilkerson   May 21, 1925  [id number]  Primary Physician GREEN, [name], MD Primary Cardiologist: Runell Gess MD Kristin Wilkerson   HPI:  80 year old female patient followed by me for hypertension and hyperlipidemia she had echo in 2013 and her last stress test was 2011 which was negative for ischemia.Today she presents with complaints of racing heart rate or heart when she awakens at 4 AM is racing she can go back to sleep easily but it will continue to race. She was on a new medication her arthritis after having her knee injected she thinks that started the tachycardia but it continues over a month after stopping the medication. She is now on Cymbalta to help with her myalgias as well. After she discussed her racing heart rate she mentioned that she was also having arthritis in her chest as well as left arm arthritis. Her EKG was without acute changes she was tender to touch in left anterior chest and in the left arm where she complained of the pain. She is very anxious that the pain could be cardiac she thinks is arthritis but is worried that it could be her heart.  She was seen by Nada Boozer registered nurse partition her on 12/10/12 with complaints of atypical chest pain and heart racing. A Myoview stress test was entirely normal. An event monitor showed sinus rhythm/sinus tachycardia. She admits to being a highly anxious person currently on Cymbalta per her PCP . Since I saw her 3 months ago her husband Kristin Wilkerson passed away on 07-18-2013 and she is appropriately grieving.. She still has atypical chest pain which I do not think is cardiac.  She comes in today because of left arm pain which she's had for the last 3 days. The pain is positional. It sounds arthritic.    Current Outpatient Prescriptions  Medication Sig Dispense Refill  . atorvastatin (LIPITOR) 20 MG tablet One each evening to control cholesterol  90 tablet  3  . Calcium Carbonate-Vitamin D (CALCIUM 600+D)  600-400 MG-UNIT per tablet Take one tablet by mouth daily for calcium supplement  30 tablet  5  . cholecalciferol (VITAMIN D) 1000 UNITS tablet Take one tablet once daily for vitamin d      . clopidogrel (PLAVIX) 75 MG tablet One each morning for anticoagulation  90 tablet  3  . DULoxetine (CYMBALTA) 30 MG capsule One daily  To help pains and nerves.  90 capsule  3  . furosemide (LASIX) 40 MG tablet TAKE 1 TABLET BY MOUTH EVERY DAY TO CONTROL EDEMA  30 tablet  3  . latanoprost (XALATAN) 0.005 % ophthalmic solution Instill one drop to both eyes at bedtime to treat glaucoma  7.5 mL  4  . LORazepam (ATIVAN) 1 MG tablet Take 1 tablet (1 mg total) by mouth 2 (two) times daily as needed for anxiety.  30 tablet  3  . losartan (COZAAR) 100 MG tablet Take one tablet once daily for blood pressure  90 tablet  3  . metoprolol (LOPRESSOR) 100 MG tablet One twice daily to control BP  180 tablet  3  . naproxen (NAPROSYN) 500 MG tablet Take 500 mg by mouth as needed. For pain       No current facility-administered medications for this visit.    No Known Allergies  History   Social History  . Marital Status: Married    Spouse Name: N/A    Number of Children: 3  . Years of Education: 12th  Occupational History  . retired    Social History Main Topics  . Smoking status: Never Smoker   . Smokeless tobacco: Not on file  . Alcohol Use: No  . Drug Use: No  . Sexual Activity: No   Other Topics Concern  . Not on file   Social History Narrative  . No narrative on file     Review of Systems: General: negative for chills, fever, night sweats or weight changes.  Cardiovascular: negative for chest pain, dyspnea on exertion, edema, orthopnea, palpitations, paroxysmal nocturnal dyspnea or shortness of breath Dermatological: negative for rash Respiratory: negative for cough or wheezing Urologic: negative for hematuria Abdominal: negative for nausea, vomiting, diarrhea, bright red blood per rectum,  melena, or hematemesis Neurologic: negative for visual changes, syncope, or dizziness All other systems reviewed and are otherwise negative except as noted above.    Blood pressure 140/60, pulse 57, height 5\' 1"  (1.549 m), weight 160 lb 6.4 oz (72.757 kg).  General appearance: alert and no distress Neck: no adenopathy, no carotid bruit, no JVD, supple, symmetrical, trachea midline and thyroid not enlarged, symmetric, no tenderness/mass/nodules Lungs: clear to auscultation bilaterally Heart: regular rate and rhythm, S1, S2 normal, no murmur, click, rub or gallop Extremities: extremities normal, atraumatic, no cyanosis or edema  EKG sinus bradycardia at 57 with lateral T wave inversion unchanged from prior EKG  ASSESSMENT AND PLAN:   HLD (hyperlipidemia) On statin therapy with recent lipid profile performed 06/12/13 revealed an LDL of 85 and HDL of 42  Left arm pain I just saw Kristin Wilkerson one month ago. She comes in today with 2-3 days of left arm pain which sounds orthostatic. It does change with position. She really denies chest pain. She does have lateral T wave inversion unchanged from prior EKGs. I have reassured her.      Runell GessJonathan J. Naiara Lombardozzi MD FACP,FACC,FAHA, [facility] 11/06/2013 11:45 AM

## 2013-11-06 NOTE — Patient Instructions (Signed)
Your physician recommends that you schedule a follow-up appointment in: 6 months with a extender and 12 months with Dr. Allyson SabalBerry. No changes were made today in your therapy.

## 2013-11-11 ENCOUNTER — Other Ambulatory Visit: Payer: Self-pay | Admitting: Internal Medicine

## 2013-11-27 ENCOUNTER — Ambulatory Visit (INDEPENDENT_AMBULATORY_CARE_PROVIDER_SITE_OTHER): Payer: Medicare Other | Admitting: Internal Medicine

## 2013-11-27 ENCOUNTER — Encounter: Payer: Self-pay | Admitting: Internal Medicine

## 2013-11-27 VITALS — BP 122/70 | HR 100 | Temp 97.6°F | Wt 162.0 lb

## 2013-11-27 DIAGNOSIS — IMO0001 Reserved for inherently not codable concepts without codable children: Secondary | ICD-10-CM

## 2013-11-27 DIAGNOSIS — F4321 Adjustment disorder with depressed mood: Secondary | ICD-10-CM

## 2013-11-27 DIAGNOSIS — R609 Edema, unspecified: Secondary | ICD-10-CM | POA: Diagnosis not present

## 2013-11-27 DIAGNOSIS — I6350 Cerebral infarction due to unspecified occlusion or stenosis of unspecified cerebral artery: Secondary | ICD-10-CM

## 2013-11-27 DIAGNOSIS — I509 Heart failure, unspecified: Secondary | ICD-10-CM

## 2013-11-27 DIAGNOSIS — E785 Hyperlipidemia, unspecified: Secondary | ICD-10-CM

## 2013-11-27 DIAGNOSIS — I10 Essential (primary) hypertension: Secondary | ICD-10-CM | POA: Diagnosis not present

## 2013-11-27 DIAGNOSIS — I5032 Chronic diastolic (congestive) heart failure: Secondary | ICD-10-CM

## 2013-11-27 MED ORDER — FUROSEMIDE 40 MG PO TABS: ORAL_TABLET | ORAL | Status: DC

## 2013-11-27 NOTE — Progress Notes (Signed)
Patient ID: Kristin Bullocksnn T Taitano, female   DOB: Jun 11, 1925, 80 y.o.   MRN: [id number]    Location:    [facility]  Place of Service:  OFFICE    No Known Allergies  Chief Complaint  Patient presents with  . Acute Visit    cramps in stomach and having medication issues: Forosemide causing excess dose be cut in half & Duloxetine causing vivid hallucinations     HPI:  Having cramps in the lower abdomen. Se thinks related to furosemide. Gets the cramp daily after taking this medication.  Having vivid hallucinations at night. She thinks related to duloxetine. She sees something that goes up in the air.  Wheezing at night.  Medications: Patient's Medications  New Prescriptions   No medications on file  Previous Medications   ATORVASTATIN (LIPITOR) 20 MG TABLET    One each evening to control cholesterol   CALCIUM CARBONATE-VITAMIN D (CALCIUM 600+D) 600-400 MG-UNIT PER TABLET    Take one tablet by mouth daily for calcium supplement   CHOLECALCIFEROL (VITAMIN D) 1000 UNITS TABLET    Take one tablet once daily for vitamin d   CLOPIDOGREL (PLAVIX) 75 MG TABLET    One each morning for anticoagulation   DULOXETINE (CYMBALTA) 30 MG CAPSULE    TAKE 1 TABLET BY MOUTH DAILY TO HELP PAINS AND NERVES   FUROSEMIDE (LASIX) 40 MG TABLET    TAKE 1 TABLET BY MOUTH EVERY DAY TO CONTROL EDEMA   LATANOPROST (XALATAN) 0.005 % OPHTHALMIC SOLUTION    Instill one drop to both eyes at bedtime to treat glaucoma   LORAZEPAM (ATIVAN) 1 MG TABLET    Take 1 tablet (1 mg total) by mouth 2 (two) times daily as needed for anxiety.   LOSARTAN (COZAAR) 100 MG TABLET    Take one tablet once daily for blood pressure   METOPROLOL (LOPRESSOR) 100 MG TABLET    One twice daily to control BP   NAPROXEN (NAPROSYN) 500 MG TABLET    Take 500 mg by mouth as needed. For pain  Modified Medications   No medications on file  Discontinued Medications   No medications on file     Review of Systems  Constitutional: Negative.   HENT: Positive  for congestion.   Eyes:       Right hemianopsia  Respiratory: Positive for shortness of breath. Negative for cough and wheezing.   Cardiovascular: Positive for leg swelling.  Gastrointestinal: Negative.   Endocrine: Negative.   Genitourinary: Positive for urgency and frequency.       Nocturia  Musculoskeletal: Positive for arthralgias and myalgias (legs hurt in calves).  Skin: Negative for rash.  Allergic/Immunologic: Negative.   Neurological:       Tingling sensations in lower extremities. TIA/CVA 03/07/2013. Has had resolution of the decreased vision in the right eye and the left facial droop.  Hematological: Negative.   Psychiatric/Behavioral: Positive for hallucinations (at night) and sleep disturbance.       Can't get her husband's death out of her mind.    Filed Vitals:   11/27/13 1352  BP: 122/70  Pulse: 100  Temp: 97.6 F (36.4 C)  TempSrc: Oral  Weight: 162 lb (73.483 kg)  SpO2: 98%   Body mass index is 30.63 kg/(m^2).  Physical Exam  Constitutional: She is oriented to person, place, and time. She appears well-developed and well-nourished. No distress.  HENT:  Head: Normocephalic.  Right Ear: External ear normal.  Left Ear: External ear normal.  Nose: Nose normal.  Eyes: Conjunctivae and EOM are normal. Pupils are equal, round, and reactive to light.  Mild right hemianopsia laterally  Neck: Normal range of motion. Neck supple. No JVD present. No tracheal deviation present. No thyromegaly present.  Cardiovascular: Exam reveals no distant heart sounds and no friction rub.   Murmur heard.  Systolic murmur is present with a grade of 2/6  Holosystolic  Pulmonary/Chest: No respiratory distress. She has no wheezes. She has rales. She exhibits no tenderness.  Bronchial rattle  Abdominal: Soft. Bowel sounds are normal. She exhibits no distension and no mass. There is no tenderness.  Musculoskeletal: Normal range of motion. She exhibits edema. She exhibits no  tenderness.  Lymphadenopathy:    She has no cervical adenopathy.  Neurological: She is alert and oriented to person, place, and time. She has normal reflexes.  Skin: Skin is warm and dry. No rash noted. No erythema. No pallor.  Ecchymosis of the left 5th toe at MTP joint. Abrasion of the left foot dorsum with some scabs. 2 Corns of the left foot sole.  Psychiatric: She has a normal mood and affect. Her behavior is normal. Judgment and thought content normal.     Labs reviewed: No visits with results within 3 Month(s) from this visit. Latest known visit with results is:  Office Visit on 06/12/2013  Component Date Value Ref Range Status  . Glucose 06/12/2013 86  65 - 99 mg/dL Final  . BUN 16/01/9603 19  8 - 27 mg/dL Final  . Creatinine, Ser 06/12/2013 0.94  0.57 - 1.00 mg/dL Final  . GFR calc non Af Amer 06/12/2013 55* >59 mL/min/1.73 Final  . GFR calc Af Amer 06/12/2013 64  >59 mL/min/1.73 Final  . BUN/Creatinine Ratio 06/12/2013 20  11 - 26 Final  . Sodium 06/12/2013 143  134 - 144 mmol/L Final  . Potassium 06/12/2013 4.9  3.5 - 5.2 mmol/L Final  . Chloride 06/12/2013 102  97 - 108 mmol/L Final  . CO2 06/12/2013 27  18 - 29 mmol/L Final  . Calcium 06/12/2013 10.1  8.7 - 10.3 mg/dL Final  . BNP 54/12/8117 107.7* 0.0 - 100.0 pg/mL Final  . Cholesterol, Total 06/12/2013 161  100 - 199 mg/dL Final  . Triglycerides 06/12/2013 168* 0 - 149 mg/dL Final  . HDL 14/78/2956 42  >39 mg/dL Final   Comment: According to ATP-III Guidelines, HDL-C >59 mg/dL is considered a                          negative risk factor for CHD.  [name] VLDL Cholesterol Cal 06/12/2013 34  5 - 40 mg/dL Final  . LDL Calculated 06/12/2013 85  0 - 99 mg/dL Final  . Chol/HDL Ratio 06/12/2013 3.8  0.0 - 4.4 ratio units Final   Comment:                                   T. Chol/HDL Ratio                                                                      Men  Women  1/2 Avg.Risk  3.4    3.3                                                            Avg.Risk  5.0    4.4                                                         2X Avg.Risk  9.6    7.1                                                         3X Avg.Risk 23.4   11.0      Assessment/Plan  1. Myalgia and myositis, unspecified Calves bilaterally. She will be seeing Dr. Darrelyn Hillock  2. Essential hypertension controlled  3. HLD (hyperlipidemia) Recheck in future  4. Edema - furosemide (LASIX) 40 MG tablet; 1/2 tablet daily to control edema  Dispense: 30 tablet; Refill: 3  5. Chronic diastolic congestive heart failure compensated  6. Grief Regarding her husband's death

## 2013-11-28 ENCOUNTER — Encounter: Payer: Self-pay | Admitting: Nurse Practitioner

## 2013-11-28 ENCOUNTER — Ambulatory Visit (INDEPENDENT_AMBULATORY_CARE_PROVIDER_SITE_OTHER): Payer: Medicare Other | Admitting: Nurse Practitioner

## 2013-11-28 VITALS — BP 125/70 | HR 61 | Wt 162.8 lb

## 2013-11-28 DIAGNOSIS — I6350 Cerebral infarction due to unspecified occlusion or stenosis of unspecified cerebral artery: Secondary | ICD-10-CM

## 2013-11-28 DIAGNOSIS — I639 Cerebral infarction, unspecified: Secondary | ICD-10-CM

## 2013-11-28 NOTE — Patient Instructions (Signed)
PLAN:  Continue clopidogrel 75 mg orally every day for secondary stroke prevention and maintain strict control of hypertension with blood pressure goal below 140/90, and lipids with LDL cholesterol goal below 100 mg/dL.  Followup in the future with Dr. Pearlean BrownieSethi in 6 months, sooner as needed.   Stroke Prevention Some medical conditions and behaviors are associated with an increased chance of having a stroke. You may prevent a stroke by making healthy choices and managing medical conditions. HOW CAN I REDUCE MY RISK OF HAVING A STROKE?   Stay physically active. Get at least 30 minutes of activity on most or all days.  Do not smoke. It may also be helpful to avoid exposure to secondhand smoke.  Limit alcohol use. Moderate alcohol use is considered to be:  No more than 2 drinks per day for men.  No more than 1 drink per day for nonpregnant women.  Eat healthy foods. This involves:  Eating 5 or more servings of fruits and vegetables a day.  Making dietary changes that address high blood pressure (hypertension), high cholesterol, diabetes, or obesity.  Manage your cholesterol levels.  Making food choices that are high in fiber and low in saturated fat, trans fat, and cholesterol may control cholesterol levels.  Take any prescribed medicines to control cholesterol as directed by your health care provider.  Manage your diabetes.  Controlling your carbohydrate and sugar intake is recommended to manage diabetes.  Take any prescribed medicines to control diabetes as directed by your health care provider.  Control your hypertension.  Making food choices that are low in salt (sodium), saturated fat, trans fat, and cholesterol is recommended to manage hypertension.  Take any prescribed medicines to control hypertension as directed by your health care provider.  Maintain a healthy weight.  Reducing calorie intake and making food choices that are low in sodium, saturated fat, trans fat, and  cholesterol are recommended to manage weight.  Stop drug abuse.  Avoid taking birth control pills.  Talk to your health care provider about the risks of taking birth control pills if you are over 78 years old, smoke, get migraines, or have ever had a blood clot.  Get evaluated for sleep disorders (sleep apnea).  Talk to your health care provider about getting a sleep evaluation if you snore a lot or have excessive sleepiness.  Take medicines only as directed by your health care provider.  For some people, aspirin or blood thinners (anticoagulants) are helpful in reducing the risk of forming abnormal blood clots that can lead to stroke. If you have the irregular heart rhythm of atrial fibrillation, you should be on a blood thinner unless there is a good reason you cannot take them.  Understand all your medicine instructions.  Make sure that other conditions (such as anemia or atherosclerosis) are addressed. SEEK IMMEDIATE MEDICAL CARE IF:   You have sudden weakness or numbness of the face, arm, or leg, especially on one side of the body.  Your face or eyelid droops to one side.  You have sudden confusion.  You have trouble speaking (aphasia) or understanding.  You have sudden trouble seeing in one or both eyes.  You have sudden trouble walking.  You have dizziness.  You have a loss of balance or coordination.  You have a sudden, severe headache with no known cause.  You have new chest pain or an irregular heartbeat. Any of these symptoms may represent a serious problem that is an emergency. Do not wait to see  if the symptoms will go away. Get medical help at once. Call your local emergency services (911 in [place] Do not drive yourself to the hospital. Document Released: 05/12/2004 Document Revised: 08/19/2013 Document Reviewed: 10/05/2012 [facility] Patient Information 2015 [place], [place]. This information is not intended to replace advice given to you by your health care  provider. Make sure you discuss any questions you have with your health care provider.

## 2013-11-28 NOTE — Progress Notes (Signed)
PATIENT: Kristin Wilkerson DOB: Jul 14, 1925  REASON FOR VISIT: routine follow up for stroke HISTORY FROM: patient  HISTORY OF PRESENT ILLNESS: Kristin Wilkerson is an 80 y.o. female history of hypertension and hyperlipidemia. Kristin Wilkerson comes to the office today for first post-hospital stroke visit. She is accompanied by one of her sons. She presented with acute onset of blurred vision and numbness involving left side of her face as well as slight facial droop on 02/28/2013. Has no previous history of stroke nor TIA. Patient was on aspirin 81 mg per day. CT scan of the head showed no acute intracranial abnormality. Deficits resolved essentially during the emergency room stay, except for minimal residual blurring of vision. NIH stroke score was 0. Patient was not a TPA candidate secondary to Rapidly resolving deficits. She was admitted for further evaluation and treatment. MRI of the brain did show an Acute ischemic infarct involving the right occipital lobe. No evidence of hemorrhagic conversion. Moderate age-related atrophy. MRA of the brain showed Multi focal irregularity involving the middle cerebral and posterior cerebral arteries, likely related to underlying atherosclerotic disease. No high-grade flow-limiting stenosis identified. No MRA evidence of intracranial aneurysm. 2D Echocardiogram showed an ejection fraction 55-60%, no cardiac source of emboli identified. Carotid Doppler showed Bilateral 1-39% ICA stenosis.   She does not feel that she was very affected from the stroke, but she has seen her eye doctor and knows that her vision has worsened; she needed a new eye glass prescription. She states her BP is usually well controlled and her BP is 144/76 today in our office. She is tolerating Plavix well without any significant bruising. She has no weakness or numbness in her extremities and ambulates well.   Update 11/28/13 (LL): Since last visit, Kristin Wilkerson' husband passed in March. She is accompanied  by a son today. She has been put on Duloxetine to help her with grief. She has had no recurrent stroke or TIA symptoms. Most recent LDL was 85. She is not diabetic. Her blood pressure is well controlled, it is 125/70 in the office today.  She is tolerating Plavix well without any significant bruising. She has no complaints today.  REVIEW OF SYSTEMS: Full 14 system review of systems performed and notable only for: nothing, no complaints.  ALLERGIES: No Known Allergies  HOME MEDICATIONS: Outpatient Prescriptions Prior to Visit  Medication Sig Dispense Refill  . atorvastatin (LIPITOR) 20 MG tablet One each evening to control cholesterol  90 tablet  3  . Calcium Carbonate-Vitamin D (CALCIUM 600+D) 600-400 MG-UNIT per tablet Take one tablet by mouth daily for calcium supplement  30 tablet  5  . cholecalciferol (VITAMIN D) 1000 UNITS tablet Take one tablet once daily for vitamin d      . clopidogrel (PLAVIX) 75 MG tablet One each morning for anticoagulation  90 tablet  3  . furosemide (LASIX) 40 MG tablet 1/2 tablet daily to control edema  30 tablet  3  . latanoprost (XALATAN) 0.005 % ophthalmic solution Instill one drop to both eyes at bedtime to treat glaucoma  7.5 mL  4  . losartan (COZAAR) 100 MG tablet Take one tablet once daily for blood pressure  90 tablet  3  . metoprolol (LOPRESSOR) 100 MG tablet One twice daily to control BP  180 tablet  3  . LORazepam (ATIVAN) 1 MG tablet Take 1 tablet (1 mg total) by mouth 2 (two) times daily as needed for anxiety.  30 tablet  3  .  naproxen (NAPROSYN) 500 MG tablet Take 500 mg by mouth as needed. For pain       No facility-administered medications prior to visit.    PHYSICAL EXAM Filed Vitals:   11/28/13 1407  BP: 125/70  Pulse: 61  Weight: 162 lb 12.8 oz (73.846 kg)   Body mass index is 30.78 kg/(m^2).  Generalized: Well developed, in no acute distress  Head: normocephalic and atraumatic. Oropharynx benign  Neck: Supple, no carotid bruits    Cardiac: Regular rate rhythm, no murmur  Musculoskeletal: No deformity   Neurological examination  Mentation: Alert oriented to time, place, history taking. Follows all commands speech and language fluent  Cranial nerve II-XII: Pupils were equal round reactive to light. Vision acuity and fields appear diminished in left upper quadrant which is old per patient. Facial sensation and strength were normal. Hearing is slightly reduced bilaterally. Uvula tongue midline. head turning and shoulder shrug and were normal and symmetric.Tongue protrusion into cheek strength was normal.  Motor: normal bulk and tone, full strength in the BUE, BLE, fine finger movements normal, no pronator drift. No focal weakness  Sensory: normal and symmetric to light touch, pinprick, and vibration  Coordination: finger-nose-finger, heel-to-shin bilaterally, no dysmetria  Reflexes: Deep tendon reflexes in the upper and lower extremities are present and symmetric.  Gait and Station: Rising up from seated position without assistance, normal stance, without trunk ataxia, moderate stride, good arm swing, smooth turning, unable to perform tiptoe, and heel walking without difficulty.   ASSESSMENT: [name] is a 80 y.o. female who presented with blurred vision, left facial numbness, and left facial droop on 02/28/2013. Imaging confirmed a right occipital infarct. 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 Patient with residual left upper field cut.   PLAN:  Continue clopidogrel 75 mg orally every day for secondary stroke prevention and maintain strict control of hypertension with blood pressure goal below 140/90, and lipids with LDL cholesterol goal below 100 mg/dL.  Followup in the future with Dr. Pearlean Brownie in 6 months, sooner as needed.   Kristin Asal Giovanni Bath, MSN, FNP-BC, A/GNP-C 11/29/2013, 9:49 AM [facility] [address] [place], [place] [address] [phone]  Note: This document was prepared with digital dictation and possible smart phrase technology. Any transcriptional errors that result from this process are unintentional.

## 2013-11-29 ENCOUNTER — Encounter: Payer: Self-pay | Admitting: Nurse Practitioner

## 2013-11-29 NOTE — Progress Notes (Signed)
I agree with the above plan 

## 2013-12-06 DIAGNOSIS — M1710 Unilateral primary osteoarthritis, unspecified knee: Secondary | ICD-10-CM | POA: Diagnosis not present

## 2013-12-17 DIAGNOSIS — H4011X0 Primary open-angle glaucoma, stage unspecified: Secondary | ICD-10-CM | POA: Diagnosis not present

## 2013-12-17 DIAGNOSIS — H409 Unspecified glaucoma: Secondary | ICD-10-CM | POA: Diagnosis not present

## 2013-12-17 DIAGNOSIS — H3530 Unspecified macular degeneration: Secondary | ICD-10-CM | POA: Diagnosis not present

## 2013-12-19 ENCOUNTER — Telehealth: Payer: Self-pay | Admitting: *Deleted

## 2013-12-19 NOTE — Telephone Encounter (Signed)
Per Dr. Sharol Harness unlikely from stopping the Cymbalta but if continues having bruising or any signs of bleeding need to get bloodwork done. How long has the runny nose and headache been? Have patient seen in clinic if symptoms worsen.

## 2013-12-19 NOTE — Telephone Encounter (Signed)
Patient stated that since she stopped taking the Cymbalta she has started bruising. She states that she looks like shes been beat up. And her nose has been running and headache. She wants to know if she should go back on it. Please Advise.

## 2013-12-19 NOTE — Telephone Encounter (Signed)
Patient Notified and stated that she has had the runny nose and headache for about a week. She thinks it is coming from her husband passing. Patient will give [place] a call if it continues. If any symptoms worsen over the weekend she will go to the Urgent care and if it is no better by next week she will call us and make an appointment.

## 2013-12-25 ENCOUNTER — Telehealth: Payer: Self-pay | Admitting: *Deleted

## 2013-12-25 NOTE — Telephone Encounter (Signed)
Patient used to take Cymbalta and then Dr. Chilton Si stopped it because she was having Hallucinations. Now she is bruising and bleeding more frequently. She has had a headache. And nose running and she thinks it is a sinus infection. Appointment made with Dr. Renato Gails for Friday. Patient agreed.

## 2013-12-27 ENCOUNTER — Ambulatory Visit: Payer: Self-pay | Admitting: Internal Medicine

## 2014-01-07 ENCOUNTER — Ambulatory Visit (INDEPENDENT_AMBULATORY_CARE_PROVIDER_SITE_OTHER): Payer: Medicare Other | Admitting: Internal Medicine

## 2014-01-07 ENCOUNTER — Encounter: Payer: Self-pay | Admitting: Internal Medicine

## 2014-01-07 VITALS — BP 130/78 | HR 75 | Temp 98.1°F | Resp 10 | Ht 61.0 in | Wt 156.0 lb

## 2014-01-07 DIAGNOSIS — I6330 Cerebral infarction due to thrombosis of unspecified cerebral artery: Secondary | ICD-10-CM | POA: Diagnosis not present

## 2014-01-07 DIAGNOSIS — E785 Hyperlipidemia, unspecified: Secondary | ICD-10-CM

## 2014-01-07 DIAGNOSIS — F4321 Adjustment disorder with depressed mood: Secondary | ICD-10-CM

## 2014-01-07 DIAGNOSIS — I10 Essential (primary) hypertension: Secondary | ICD-10-CM | POA: Diagnosis not present

## 2014-01-07 DIAGNOSIS — F331 Major depressive disorder, recurrent, moderate: Secondary | ICD-10-CM

## 2014-01-07 DIAGNOSIS — R609 Edema, unspecified: Secondary | ICD-10-CM

## 2014-01-07 DIAGNOSIS — Z23 Encounter for immunization: Secondary | ICD-10-CM | POA: Diagnosis not present

## 2014-01-07 DIAGNOSIS — R600 Localized edema: Secondary | ICD-10-CM

## 2014-01-07 DIAGNOSIS — I6350 Cerebral infarction due to unspecified occlusion or stenosis of unspecified cerebral artery: Secondary | ICD-10-CM | POA: Diagnosis not present

## 2014-01-07 DIAGNOSIS — I63339 Cerebral infarction due to thrombosis of unspecified posterior cerebral artery: Secondary | ICD-10-CM

## 2014-01-07 DIAGNOSIS — I509 Heart failure, unspecified: Secondary | ICD-10-CM

## 2014-01-07 DIAGNOSIS — I5032 Chronic diastolic (congestive) heart failure: Secondary | ICD-10-CM

## 2014-01-07 DIAGNOSIS — G4700 Insomnia, unspecified: Secondary | ICD-10-CM

## 2014-01-07 MED ORDER — DULOXETINE HCL 30 MG PO CPEP: ORAL_CAPSULE | ORAL | Status: DC

## 2014-01-07 NOTE — Progress Notes (Signed)
Patient ID: Kristin Wilkerson, female   DOB: 10-22-1925, 80 y.o.   MRN: [id number]    Location:    [facility]  Place of Service:  OFFICE    No Known Allergies  Chief Complaint  Patient presents with  . Medication Management    Discuss medicaitons: patient is dizzy, with black/blue marks on arms and legs . Patient questions if these are side effects of medicaitons   . Medication Management    Discuss restarting cymbalta     HPI:  Hospitalized 11/13/154 to 03/02/14 for occipital stroke. Has affected vision some. Was put on Plavix post stroke for stroke prevention.   Last in office 11/27/13 with abd cramps she thought related to use of Lasix. She has not stopped it. Edema has improved.  Reported vivid nocturnal hallucinations at her last visit. She thought related to use of Cymbalta. She is not having any hallucinations now, but is depressed.  Medications: Patient's Medications  New Prescriptions   No medications on file  Previous Medications   ATORVASTATIN (LIPITOR) 20 MG TABLET    One each evening to control cholesterol   CALCIUM CARBONATE-VITAMIN D (CALCIUM 600+D) 600-400 MG-UNIT PER TABLET    Take one tablet by mouth daily for calcium supplement   CHOLECALCIFEROL (VITAMIN D) 1000 UNITS TABLET    Take one tablet once daily for vitamin d   CLOPIDOGREL (PLAVIX) 75 MG TABLET    One each morning for anticoagulation   FUROSEMIDE (LASIX) 40 MG TABLET    1/2 tablet daily to control edema   LATANOPROST (XALATAN) 0.005 % OPHTHALMIC SOLUTION    Instill one drop to both eyes at bedtime to treat glaucoma   LOSARTAN (COZAAR) 100 MG TABLET    Take one tablet once daily for blood pressure   METOPROLOL (LOPRESSOR) 100 MG TABLET    One twice daily to control BP  Modified Medications   No medications on file  Discontinued Medications   DULOXETINE (CYMBALTA) 30 MG CAPSULE    Take 30 mg by mouth daily.     Review of Systems  Constitutional: Negative.   HENT: Positive for congestion.   Eyes:   Right hemianopsia  Respiratory: Positive for shortness of breath. Negative for cough and wheezing.   Cardiovascular: Positive for leg swelling.  Gastrointestinal: Negative.   Endocrine: Negative.   Genitourinary: Positive for urgency and frequency.       Nocturia  Musculoskeletal: Positive for arthralgias and myalgias (legs hurt in calves).  Skin: Negative for rash.  Allergic/Immunologic: Negative.   Neurological:       Tingling sensations in lower extremities. TIA/CVA 03/07/2013. Has had resolution of the decreased vision in the right eye and the left facial droop.  Hematological: Negative.   Psychiatric/Behavioral: Positive for hallucinations (at night) and sleep disturbance.       Can't get her husband's death out of her mind.    Filed Vitals:   01/07/14 1334  BP: 130/78  Pulse: 75  Temp: 98.1 F (36.7 C)  TempSrc: Oral  Resp: 10  Height:  (1.549 m)  Weight: 156 lb (70.761 kg)  SpO2: 98%   Body mass index is 29.49 kg/(m^2).  Physical Exam  Constitutional: She is oriented to person, place, and time. She appears well-developed and well-nourished. No distress.  HENT:  Head: Normocephalic.  Right Ear: External ear normal.  Left Ear: External ear normal.  Nose: Nose normal.  Eyes: ConjunctiSEFERINA Wilkerson are normal. Pupils are equal, round, and reactive to light.  Mild  right hemianopsia laterally  Neck: Normal range of motion. Neck supple. No JVD present. No tracheal deviation present. No thyromegaly present.  Cardiovascular: Exam reveals no distant heart sounds and no friction rub.   Murmur heard.  Systolic murmur is present with a grade of 2/6  Holosystolic  Pulmonary/Chest: No respiratory distress. She has no wheezes. She has no rales. She exhibits no tenderness.  Abdominal: Soft. Bowel sounds are normal. She exhibits no distension and no mass. There is no tenderness.  Musculoskeletal: Normal range of motion. She exhibits edema. She exhibits no tenderness.    Lymphadenopathy:    She has no cervical adenopathy.  Neurological: She is alert and oriented to person, place, and time. She has normal reflexes.  Skin: Skin is warm and dry. No rash noted. No erythema. No pallor.  Abrasion of the left foot dorsum with some scabs. 2 Corns of the left foot sole.  Psychiatric: She has a normal mood and affect. Her behavior is normal. Judgment and thought content normal.     Labs reviewed: No visits with results within 3 Month(s) from this visit. Latest known visit with results is:  Office Visit on 06/12/2013  Component Date Value Ref Range Status  . Glucose 06/12/2013 86  65 - 99 mg/dL Final  . BUN 40/98/1191 19  8 - 27 mg/dL Final  . Creatinine, Ser 06/12/2013 0.94  0.57 - 1.00 mg/dL Final  . GFR calc non Af Amer 06/12/2013 55* >59 mL/min/1.73 Final  . GFR calc Af Amer 06/12/2013 64  >59 mL/min/1.73 Final  . BUN/Creatinine Ratio 06/12/2013 20  11 - 26 Final  . Sodium 06/12/2013 143  134 - 144 mmol/L Final  . Potassium 06/12/2013 4.9  3.5 - 5.2 mmol/L Final  . Chloride 06/12/2013 102  97 - 108 mmol/L Final  . CO2 06/12/2013 27  18 - 29 mmol/L Final  . Calcium 06/12/2013 10.1  8.7 - 10.3 mg/dL Final  . BNP 47/82/9562 107.7* 0.0 - 100.0 pg/mL Final  . Cholesterol, Total 06/12/2013 161  100 - 199 mg/dL Final  . Triglycerides 06/12/2013 168* 0 - 149 mg/dL Final  . HDL 13/11/6576 42  >39 mg/dL Final   Comment: According to ATP-III Guidelines, HDL-C >59 mg/dL is considered a                          negative risk factor for CHD.  [name] VLDL Cholesterol Cal 06/12/2013 34  5 - 40 mg/dL Final  . LDL Calculated 06/12/2013 85  0 - 99 mg/dL Final  . Chol/HDL Ratio 06/12/2013 3.8  0.0 - 4.4 ratio units Final   Comment:                                   T. Chol/HDL Ratio                                                                      Men  Women  1/2 Avg.Risk  3.4    3.3                                                             Avg.Risk  5.0    4.4                                                         2X Avg.Risk  9.6    7.1                                                         3X Avg.Risk 23.4   11.0    Assessment/Plan  1. Need for prophylactic vaccination and inoculation against influenza  2. Essential hypertension controlled - Comprehensive metabolic panel; Future  3. Bilateral edema of lower extremity improved  4. 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 Grief stable  6. Edema improved  7. Chronic diastolic congestive heart failure stable  8. Insomnia, unspecified improved  9. Depression, major, recurrent, moderate - DULoxetine (CYMBALTA) 30 MG capsule; One daily to help nerves  Dispense: 30 capsule; Refill: 3 - TSH; Future  10. HLD (hyperlipidemia) - Lipid panel; Future

## 2014-01-08 ENCOUNTER — Telehealth: Payer: Self-pay | Admitting: *Deleted

## 2014-01-08 NOTE — Telephone Encounter (Signed)
Patient called and stated that she was seen yesterday and had a flu shot. Patient woke up last night with nausea and pain in stomach with diarrhea. Is better this afternoon but wanted to know if this was common with the flu shot. Per Dr. Chilton Si the Gi symptoms should not be related to getting the flu shot. I told patient to watch it for the next 24-48 hours and increase fluids and eat a [place] diet. If any symptoms worsen to call. She agreed.

## 2014-01-14 DIAGNOSIS — M545 Low back pain, unspecified: Secondary | ICD-10-CM | POA: Diagnosis not present

## 2014-01-15 DIAGNOSIS — R1084 Generalized abdominal pain: Secondary | ICD-10-CM | POA: Diagnosis not present

## 2014-01-17 DIAGNOSIS — N390 Urinary tract infection, site not specified: Secondary | ICD-10-CM | POA: Diagnosis not present

## 2014-01-25 DIAGNOSIS — B029 Zoster without complications: Secondary | ICD-10-CM | POA: Diagnosis not present

## 2014-02-05 ENCOUNTER — Ambulatory Visit (INDEPENDENT_AMBULATORY_CARE_PROVIDER_SITE_OTHER): Payer: Medicare Other | Admitting: Internal Medicine

## 2014-02-05 VITALS — BP 140/90 | HR 70 | Temp 98.2°F | Ht 61.0 in | Wt 159.8 lb

## 2014-02-05 DIAGNOSIS — I10 Essential (primary) hypertension: Secondary | ICD-10-CM | POA: Diagnosis not present

## 2014-02-05 DIAGNOSIS — B029 Zoster without complications: Secondary | ICD-10-CM | POA: Diagnosis not present

## 2014-02-05 DIAGNOSIS — I639 Cerebral infarction, unspecified: Secondary | ICD-10-CM

## 2014-02-05 NOTE — Progress Notes (Signed)
Patient ID: Kristin Wilkerson, female   DOB: Sep 21, 1925, 80 y.o.   MRN: [id number]    Facility  [facility]    Place of Service:   OFFICE   No Known Allergies  Chief Complaint  Patient presents with  . Acute Visit    Follow up on  shingles    HPI:  Shingles: Here today to follow up on shingles. Was seen on 01/25/14 at a "walk in clinic" and diagnosed with Shingles. Was treated with Valacyclovir, Cephalexin, and Hydrocodone for pain. Has completed course. Four healing lesions to right flank, where was having intense itching and pain previously. Pt denies pain, itching, tingling or numbness today.  Essential hypertension: BP today initially 180/90. Asymptomatic. 140/90 upon recheck  Medications: Patient's Medications  New Prescriptions   No medications on file  Previous Medications   ATORVASTATIN (LIPITOR) 20 MG TABLET    One each evening to control cholesterol   CALCIUM CARBONATE-VITAMIN D (CALCIUM 600+D) 600-400 MG-UNIT PER TABLET    Take one tablet by mouth daily for calcium supplement   CHOLECALCIFEROL (VITAMIN D) 1000 UNITS TABLET    Take one tablet once daily for vitamin d   CLOPIDOGREL (PLAVIX) 75 MG TABLET    One each morning for anticoagulation   DULOXETINE (CYMBALTA) 30 MG CAPSULE    One daily to help nerves   LATANOPROST (XALATAN) 0.005 % OPHTHALMIC SOLUTION    Instill one drop to both eyes at bedtime to treat glaucoma   LOSARTAN (COZAAR) 100 MG TABLET    Take one tablet once daily for blood pressure   METOPROLOL (LOPRESSOR) 100 MG TABLET    One twice daily to control BP  Modified Medications   No medications on file  Discontinued Medications   No medications on file     Review of Systems  Constitutional: Negative.   HENT: Negative.   Eyes:       Right hemianopsia  Respiratory: Negative for cough and wheezing.   Cardiovascular: Positive for leg swelling.  Gastrointestinal: Negative.   Endocrine: Negative.   Genitourinary: Positive for urgency and frequency.   Nocturia  Musculoskeletal: Positive for arthralgias and myalgias (legs hurt in calves).  Skin: Positive for rash (R flank).  Allergic/Immunologic: Negative.   Neurological:       Tingling sensations in lower extremities. TIA/CVA 03/07/2013. Has had resolution of the decreased vision in the right eye and the left facial droop.  Hematological: Negative.   Psychiatric/Behavioral: Positive for hallucinations (at night) and sleep disturbance.       Can't get her husband's death out of her mind.    Filed Vitals:   02/05/14 1403 02/05/14 1449  BP: 180/90 140/90  Pulse: 70   Temp: 98.2 F (36.8 C)   TempSrc: Oral   Height: 5\' 1"  (1.549 m)   Weight: 159 lb 12.8 oz (72.485 kg)   SpO2: 92%    Body mass index is 30.21 kg/(m^2).  Physical Exam  Constitutional: She is oriented to person, place, and time. She appears well-developed and well-nourished. No distress.  HENT:  Head: Normocephalic.  Right Ear: External ear normal.  Left Ear: External ear normal.  Nose: Nose normal.  Eyes: Conjunctivae and EOM are normal. Pupils are equal, round, and reactive to light.  Mild right hemianopsia laterally  Neck: Normal range of motion. Neck supple. No JVD present. No tracheal deviation present. No thyromegaly present.  Cardiovascular: Exam reveals no distant heart sounds and no friction rub.   Murmur heard.  Systolic murmur is  present with a grade of 2/6  Holosystolic  Pulmonary/Chest: No respiratory distress. She has no wheezes. She has no rales. She exhibits no tenderness.  Abdominal: Soft. Bowel sounds are normal. She exhibits no distension and no mass. There is no tenderness.  Musculoskeletal: Normal range of motion. She exhibits edema (puffy ankles, nonpitting). She exhibits no tenderness.  Lymphadenopathy:    She has no cervical adenopathy.  Neurological: She is alert and oriented to person, place, and time. She has normal reflexes.  Skin: Skin is warm and dry. Rash noted. No erythema. No  pallor.  Healing lesions to R flank and back  Abrasion of the left foot dorsum with some scabs. 2 Corns of the left foot sole.  Psychiatric: She has a normal mood and affect. Her behavior is normal. Judgment and thought content normal.     Labs reviewed: No visits with results within 3 Month(s) from this visit. Latest known visit with results is:  Office Visit on 06/12/2013  Component Date Value Ref Range Status  . Glucose 06/12/2013 86  65 - 99 mg/dL Final  . BUN 40/98/119102/25/2015 19  8 - 27 mg/dL Final  . Creatinine, Ser 06/12/2013 0.94  0.57 - 1.00 mg/dL Final  . GFR calc non Af Amer 06/12/2013 55* >59 mL/min/1.73 Final  . GFR calc Af Amer 06/12/2013 64  >59 mL/min/1.73 Final  . BUN/Creatinine Ratio 06/12/2013 20  11 - 26 Final  . Sodium 06/12/2013 143  134 - 144 mmol/L Final  . Potassium 06/12/2013 4.9  3.5 - 5.2 mmol/L Final  . Chloride 06/12/2013 102  97 - 108 mmol/L Final  . CO2 06/12/2013 27  18 - 29 mmol/L Final  . Calcium 06/12/2013 10.1  8.7 - 10.3 mg/dL Final  . BNP 47/82/956202/25/2015 107.7* 0.0 - 100.0 pg/mL Final  . Cholesterol, Total 06/12/2013 161  100 - 199 mg/dL Final  . Triglycerides 06/12/2013 168* 0 - 149 mg/dL Final  . HDL 13/08/657802/25/2015 42  >39 mg/dL Final   Comment: According to ATP-III Guidelines, HDL-C >59 mg/dL is considered a                          negative risk factor for CHD.  [name]. VLDL Cholesterol Cal 06/12/2013 34  5 - 40 mg/dL Final  . LDL Calculated 06/12/2013 85  0 - 99 mg/dL Final  . Chol/HDL Ratio 06/12/2013 3.8  0.0 - 4.4 ratio units Final   Comment:                                   T. Chol/HDL Ratio                                                                      Men  Women                                                        1/2 Avg.Risk  3.4    3.3  Avg.Risk  5.0    4.4                                                         2X Avg.Risk  9.6    7.1                                                          3X Avg.Risk 23.4   11.0     Assessment/Plan  1. Shingles Improving Continue Hydrocodone as needed, may break tabs in half to decrease SE of confusion  2. Essential hypertension Stable

## 2014-02-11 ENCOUNTER — Telehealth: Payer: Self-pay | Admitting: *Deleted

## 2014-02-11 NOTE — Telephone Encounter (Signed)
Patient called and stated that her BP was still up from her OV 10/21. She left message on my voicemail. Tried calling patient back but her phone rang busy twice.

## 2014-02-12 NOTE — Telephone Encounter (Signed)
I called patient and she stated that her friend which is a nurse told her that her BP was up alittle because she stopped taking the Furosemide. Patient went ahead and took 1/2 tablet of 40mg . I scheduled an appointment for patient for tomorrow with Shanda BumpsJessica.

## 2014-02-13 ENCOUNTER — Encounter: Payer: Self-pay | Admitting: Nurse Practitioner

## 2014-02-13 ENCOUNTER — Ambulatory Visit (INDEPENDENT_AMBULATORY_CARE_PROVIDER_SITE_OTHER): Payer: Medicare Other | Admitting: Nurse Practitioner

## 2014-02-13 VITALS — BP 140/82 | HR 62 | Temp 97.7°F | Resp 20 | Ht 61.0 in | Wt 160.2 lb

## 2014-02-13 DIAGNOSIS — I10 Essential (primary) hypertension: Secondary | ICD-10-CM | POA: Diagnosis not present

## 2014-02-13 DIAGNOSIS — B029 Zoster without complications: Secondary | ICD-10-CM | POA: Diagnosis not present

## 2014-02-13 DIAGNOSIS — I639 Cerebral infarction, unspecified: Secondary | ICD-10-CM

## 2014-02-13 MED ORDER — FUROSEMIDE 20 MG PO TABS: 20.0000 mg | ORAL_TABLET | Freq: Every day | ORAL | Status: DC

## 2014-02-13 NOTE — Progress Notes (Signed)
Patient ID: Kristin Wilkerson, female   DOB: 04-23-1925, 80 y.o.   MRN: [id number]    PCP: Kimber Relic, MD  No Known Allergies  Chief Complaint  Patient presents with  . Acute Visit    High BP, Shingle F/u     HPI: Patient is a 80 y.o. female seen in the office today for elevated blood pressure. Pt was seen last week by Dr Chilton Si and diagnosed with shingles, completed treatment and shingles are almost completely gone. Still has a few scabed areas remaining. Pain has resolved.  Now blood pressure is elevated for the last 2 weeks. Started after she got the shingles.  sbp 180-190, today 168/78 Took lasix 2 days in a row (has not taken this today) which brought the pressure down   Review of Systems:  Review of Systems  Constitutional: Negative.   HENT: Negative.   Eyes:       Right hemianopsia  Respiratory: Negative for cough and shortness of breath.   Cardiovascular: Positive for leg swelling. Negative for chest pain and palpitations.  Gastrointestinal: Negative.   Endocrine: Negative.   Genitourinary: Positive for frequency. Negative for urgency.       Nocturia  Musculoskeletal: Positive for arthralgias and myalgias (legs hurt in calves).  Skin: Positive for rash (R flank).  Allergic/Immunologic: Negative.   Neurological: Negative for dizziness, tremors, speech difficulty, light-headedness and headaches.       Tingling sensations in lower extremities. TIA/CVA 03/07/2013. Has had resolution of the decreased vision in the right eye and the left facial droop.  Hematological: Negative.   Psychiatric/Behavioral:       Can't get her husband's death out of her mind-reports she misses her husband    Past Medical History  Diagnosis Date  . Hypertension   . Abnormality of gait   . Disturbance of skin sensation   . Urinary frequency   . Tachycardia, unspecified   . Internal hemorrhoids without mention of complication   . Unspecified pruritic disorder   . Insomnia, unspecified   .  Synovial cyst of popliteal space   . Spinal stenosis, other region   . Sciatica   . Lumbago   . Unspecified vitamin D deficiency   . Anxiety   . Hyperlipidemia   . Abnormality of gait   . Unspecified sinusitis (chronic)   . Unspecified pruritic disorder   . Unspecified viral infection, in conditions classified elsewhere and of unspecified site   . Pain in joint, lower leg   . Depressive disorder, not elsewhere classified   . Unspecified glaucoma   . Reflux esophagitis   . Diverticulosis of colon (without mention of hemorrhage)   . Pain in joint, site unspecified   . Myalgia and myositis, unspecified   . Osteoporosis, unspecified   . Other malaise and fatigue   . Cervicalgia   . Senile osteoporosis   . Benign neoplasm of colon   . H/O echocardiogram 09/01/11    EF >55%, mild MR, RV systolic pressure elevated at 40-98 mmHg, mod TR  . Normal cardiac stress test 11/16/09    low risk scan, EF  77%  . Stroke   . Shortness of breath    Past Surgical History  Procedure Laterality Date  . Lumbar laminectomy  12/04/2012    Complete decompressive L3-4 and L4-5--Dr. Darrelyn Hillock  . Abdominal hysterectomy    . Cholecystectomy    . Cardiovascular stress test  11/16/2009    Perfusion defect seen in inferior myocardial region consistent  with diaphragmatic attenuation. Remaining myocardium demonstrates normalmyocardial perfusion with no evidence of ischemia or infarct. No ECG changes. EKG negative for ischemia.  . Transthoracic echocardiogram  09/01/2011    EF >55% and moderate tricuspid valve regurg.   Social History:   reports that she has never smoked. She does not have any smokeless tobacco history on file. She reports that she does not drink alcohol or use illicit drugs.  Family History  Problem Relation Age of Onset  . Cancer Sister   . Stroke Mother   . Stroke Father     Medications: Patient's Medications  New Prescriptions   No medications on file  Previous Medications    ATORVASTATIN (LIPITOR) 20 MG TABLET    One each evening to control cholesterol   CALCIUM CARBONATE-VITAMIN D (CALCIUM 600+D) 600-400 MG-UNIT PER TABLET    Take one tablet by mouth daily for calcium supplement   CHOLECALCIFEROL (VITAMIN D) 1000 UNITS TABLET    Take one tablet once daily for vitamin d   CLOPIDOGREL (PLAVIX) 75 MG TABLET    One each morning for anticoagulation   DULOXETINE (CYMBALTA) 30 MG CAPSULE    One daily to help nerves   LATANOPROST (XALATAN) 0.005 % OPHTHALMIC SOLUTION    Instill one drop to both eyes at bedtime to treat glaucoma   LOSARTAN (COZAAR) 100 MG TABLET    Take one tablet once daily for blood pressure   METOPROLOL (LOPRESSOR) 100 MG TABLET    One twice daily to control BP  Modified Medications   No medications on file  Discontinued Medications   No medications on file     Physical Exam:  Filed Vitals:   02/13/14 1032  BP: 168/78  Pulse: 62  Temp: 97.7 F (36.5 C)  TempSrc: Oral  Resp: 20  Height: 5\' 1"  (1.549 m)  Weight: 160 lb 3.2 oz (72.666 kg)  SpO2: 95%    Physical Exam  Constitutional: She is oriented to person, place, and time. She appears well-developed and well-nourished. No distress.  HENT:  Head: Normocephalic.  Right Ear: External ear normal.  Left Ear: External ear normal.  Nose: Nose normal.  Eyes: Conjunctivae and EOM are normal. Pupils are equal, round, and reactive to light.  Mild right hemianopsia laterally  Neck: Normal range of motion. Neck supple. No JVD present. No tracheal deviation present. No thyromegaly present.  Cardiovascular: Exam reveals no distant heart sounds and no friction rub.   Murmur heard.  Systolic murmur is present with a grade of 2/6  Holosystolic  Pulmonary/Chest: No respiratory distress. She has no wheezes. She has no rales. She exhibits no tenderness.  Abdominal: Soft. Bowel sounds are normal. She exhibits no distension and no mass. There is no tenderness.  Musculoskeletal: Normal range of motion.  She exhibits edema (puffy ankles, nonpitting). She exhibits no tenderness.  Lymphadenopathy:    She has no cervical adenopathy.  Neurological: She is alert and oriented to person, place, and time. She has normal reflexes.  Skin: Skin is warm and dry. Rash noted. No erythema. No pallor.  Faint scabs from shingles to R flank and back  Psychiatric: She has a normal mood and affect. Her behavior is normal. Judgment and thought content normal.    Labs reviewed: Basic Metabolic Panel:  Recent Labs  98/02/9110/13/14 1832 02/28/13 1849 06/12/13 1337  NA 139 141 143  K 3.8 3.8 4.9  CL 102 103 102  CO2 27  --  27  GLUCOSE 96 98 86  BUN  20 20 19   CREATININE 0.84 1.00 0.94  CALCIUM 9.2  --  10.1   Liver Function Tests:  Recent Labs  02/28/13 1832  AST 18  ALT 18  ALKPHOS 69  BILITOT 0.5  PROT 7.0  ALBUMIN 4.0   No results found for this basename: LIPASE, AMYLASE,  in the last 8760 hours No results found for this basename: AMMONIA,  in the last 8760 hours CBC:  Recent Labs  02/28/13 1832 02/28/13 1849  WBC 6.8  --   NEUTROABS 3.6  --   HGB 14.0 15.0  HCT 41.4 44.0  MCV 95.2  --   PLT 202  --    Lipid Panel:  Recent Labs  03/01/13 0516 06/12/13 1337  CHOL 187  --   HDL 60 42  LDLCALC 107* 85  TRIG 101 168*  CHOLHDL 3.1 3.8   TSH: No results found for this basename: TSH,  in the last 8760 hours A1C: Lab Results  Component Value Date   HGBA1C 5.7* 03/01/2013     Assessment/Plan  1. Shingles Improving   2. Essential hypertension -improved on recheck, however with high blood pressures with improvement of lasix will have pt restart lasix 20 mg daily -to check blood pressure after taken medication and record and bring to next visit - furosemide (LASIX) 20 MG tablet; Take 1 tablet (20 mg total) by mouth daily.  Dispense: 30 tablet; Refill: 3 -to keep follow up appt with Dr Chilton SiGreen with lab work prior to appt  Notify as needed before visit

## 2014-02-13 NOTE — Patient Instructions (Signed)
Start taking lasix 20 mg daily for blood pressure  Take blood pressure 3 times a week after medication and record, bring this to next Office Visit   Keep lab appt and follow up with Dr Chilton SiGreen

## 2014-02-17 ENCOUNTER — Other Ambulatory Visit: Payer: Self-pay | Admitting: Internal Medicine

## 2014-02-18 DIAGNOSIS — M1711 Unilateral primary osteoarthritis, right knee: Secondary | ICD-10-CM | POA: Diagnosis not present

## 2014-02-21 ENCOUNTER — Other Ambulatory Visit: Payer: Medicare Other

## 2014-02-21 DIAGNOSIS — F331 Major depressive disorder, recurrent, moderate: Secondary | ICD-10-CM | POA: Diagnosis not present

## 2014-02-21 DIAGNOSIS — I10 Essential (primary) hypertension: Secondary | ICD-10-CM

## 2014-02-21 DIAGNOSIS — E785 Hyperlipidemia, unspecified: Secondary | ICD-10-CM

## 2014-02-22 LAB — COMPREHENSIVE METABOLIC PANEL
ALK PHOS: 69 IU/L (ref 39–117)
ALT: 12 IU/L (ref 0–32)
AST: 20 IU/L (ref 0–40)
Albumin/Globulin Ratio: 1.9 (ref 1.1–2.5)
Albumin: 4.4 g/dL (ref 3.5–4.7)
BUN / CREAT RATIO: 16 (ref 11–26)
BUN: 15 mg/dL (ref 8–27)
CO2: 26 mmol/L (ref 18–29)
CREATININE: 0.96 mg/dL (ref 0.57–1.00)
Calcium: 10 mg/dL (ref 8.7–10.3)
Chloride: 98 mmol/L (ref 97–108)
GFR calc Af Amer: 62 mL/min/{1.73_m2} (ref >59)
GFR calc non Af Amer: 54 mL/min/{1.73_m2} — ABNORMAL LOW (ref >59)
GLOBULIN, TOTAL: 2.3 g/dL (ref 1.5–4.5)
Glucose: 90 mg/dL (ref 65–99)
POTASSIUM: 4.1 mmol/L (ref 3.5–5.2)
Sodium: 141 mmol/L (ref 134–144)
Total Bilirubin: 0.7 mg/dL (ref 0.0–1.2)
Total Protein: 6.7 g/dL (ref 6.0–8.5)

## 2014-02-22 LAB — LIPID PANEL
Chol/HDL Ratio: 3.9 ratio units (ref 0.0–4.4)
Cholesterol, Total: 179 mg/dL (ref 100–199)
HDL: 46 mg/dL (ref >39)
LDL Calculated: 89 mg/dL (ref 0–99)
Triglycerides: 219 mg/dL — ABNORMAL HIGH (ref 0–149)
VLDL Cholesterol Cal: 44 mg/dL — ABNORMAL HIGH (ref 5–40)

## 2014-02-22 LAB — TSH: TSH: 3.19 u[IU]/mL (ref 0.450–4.500)

## 2014-02-24 ENCOUNTER — Other Ambulatory Visit: Payer: Medicare Other

## 2014-02-26 ENCOUNTER — Encounter: Payer: Self-pay | Admitting: Internal Medicine

## 2014-02-26 ENCOUNTER — Ambulatory Visit (INDEPENDENT_AMBULATORY_CARE_PROVIDER_SITE_OTHER): Payer: Medicare Other | Admitting: Internal Medicine

## 2014-02-26 VITALS — BP 130/60 | HR 62 | Temp 97.5°F | Ht 61.0 in | Wt 158.0 lb

## 2014-02-26 DIAGNOSIS — Z8619 Personal history of other infectious and parasitic diseases: Secondary | ICD-10-CM

## 2014-02-26 DIAGNOSIS — I5032 Chronic diastolic (congestive) heart failure: Secondary | ICD-10-CM | POA: Diagnosis not present

## 2014-02-26 DIAGNOSIS — R600 Localized edema: Secondary | ICD-10-CM

## 2014-02-26 DIAGNOSIS — I639 Cerebral infarction, unspecified: Secondary | ICD-10-CM | POA: Diagnosis not present

## 2014-02-26 DIAGNOSIS — H9193 Unspecified hearing loss, bilateral: Secondary | ICD-10-CM | POA: Diagnosis not present

## 2014-02-26 DIAGNOSIS — I10 Essential (primary) hypertension: Secondary | ICD-10-CM

## 2014-02-26 DIAGNOSIS — Z8673 Personal history of transient ischemic attack (TIA), and cerebral infarction without residual deficits: Secondary | ICD-10-CM | POA: Diagnosis not present

## 2014-02-26 DIAGNOSIS — E785 Hyperlipidemia, unspecified: Secondary | ICD-10-CM | POA: Diagnosis not present

## 2014-02-26 NOTE — Progress Notes (Signed)
Patient ID: Kristin Wilkerson, female   DOB: 21-Mar-1926, 80 y.o.   MRN: [id number]    Facility  [facility]    Place of Service:   OFFICE   No Known Allergies  Chief Complaint  Patient presents with  . Follow-up    4 month follow up    HPI:  History of CVA (cerebrovascular accident): right occipital in Nov 2014. No obvious residual deficit  Bilateral edema of lower extremity: improved. Has reduced furosemide to 20 mg qd  Essential hypertension: controlled  Chronic diastolic congestive heart failure: compensated  History of shingles: Oct 2015. healed  HLD (hyperlipidemia): controlled  Loss of hearing, bilateral: not sure she wants to see audiologist    Medications: Patient's Medications  New Prescriptions   No medications on file  Previous Medications   ATORVASTATIN (LIPITOR) 20 MG TABLET    One each evening to control cholesterol   CALCIUM CARBONATE-VITAMIN D (CALCIUM 600+D) 600-400 MG-UNIT PER TABLET    Take one tablet by mouth daily for calcium supplement   CHOLECALCIFEROL (VITAMIN D) 1000 UNITS TABLET    Take one tablet once daily for vitamin d   CLOPIDOGREL (PLAVIX) 75 MG TABLET    One each morning for anticoagulation   DULOXETINE (CYMBALTA) 30 MG CAPSULE    One daily to help nerves   FUROSEMIDE (LASIX) 20 MG TABLET    Take 1 tablet (20 mg total) by mouth daily.   LATANOPROST (XALATAN) 0.005 % OPHTHALMIC SOLUTION    INSTILL 1 DROP TO BOTH EYES AT BEDTIME TO TREAT GLAUCOMA   LOSARTAN (COZAAR) 100 MG TABLET    Take one tablet once daily for blood pressure   METOPROLOL (LOPRESSOR) 100 MG TABLET    One twice daily to control BP  Modified Medications   No medications on file  Discontinued Medications   No medications on file     Review of Systems  Constitutional: Negative.   HENT: Positive for hearing loss.   Eyes:       Right hemianopsia  Respiratory: Negative for cough and shortness of breath.   Cardiovascular: Positive for leg swelling. Negative for chest pain and  palpitations.  Gastrointestinal: Negative.   Endocrine: Negative.   Genitourinary: Positive for frequency. Negative for urgency.       Nocturia  Musculoskeletal: Positive for myalgias (legs hurt in calves) and arthralgias.  Skin: Positive for rash (R flank).  Allergic/Immunologic: Negative.   Neurological: Negative for dizziness, tremors, speech difficulty, light-headedness and headaches.       Tingling sensations in lower extremities. TIA/CVA 03/07/2013. Has had resolution of the decreased vision in the right eye and the left facial droop.  Hematological: Negative.   Psychiatric/Behavioral:       Can't get her husband's death out of her mind-reports she misses her husband    Filed Vitals:   02/26/14 1133  BP: 130/60  Pulse: 62  Temp: 97.5 F (36.4 C)  TempSrc: Oral  Height: 5\' 1"  (1.549 m)  Weight: 158 lb (71.668 kg)  SpO2: 94%   Body mass index is 29.87 kg/(m^2).  Physical Exam  Constitutional: She is oriented to person, place, and time. She appears well-developed and well-nourished. No distress.  HENT:  Head: Normocephalic.  Right Ear: External ear normal.  Left Ear: External ear normal.  Nose: Nose normal.  Bilateral loss of hearing  Eyes: Conjunctivae and EOM are normal. Pupils are equal, round, and reactive to light.  Mild right hemianopsia laterally  Neck: Normal range of motion. Neck  supple. No JVD present. No tracheal deviation present. No thyromegaly present.  Cardiovascular: Exam reveals no distant heart sounds and no friction rub.   Murmur heard.  Systolic murmur is present with a grade of 2/6  Holosystolic  Pulmonary/Chest: No respiratory distress. She has no wheezes. She has no rales. She exhibits no tenderness.  Abdominal: Soft. Bowel sounds are normal. She exhibits no distension and no mass. There is no tenderness.  Musculoskeletal: Normal range of motion. She exhibits no edema or tenderness.  Lymphadenopathy:    She has no cervical adenopathy.    Neurological: She is alert and oriented to person, place, and time. She has normal reflexes.  Skin: Skin is warm and dry. Rash noted. No erythema. No pallor.  Faint scabs from shingles to R flank and back  Psychiatric: She has a normal mood and affect. Her behavior is normal. Judgment and thought content normal.     Labs reviewed: Appointment on 02/21/2014  Component Date Value Ref Range Status  . Cholesterol, Total 02/21/2014 179  100 - 199 mg/dL Final                 **Please note reference interval change**  . Triglycerides 02/21/2014 219* 0 - 149 mg/dL Final                 **Please note reference interval change**  . HDL 02/21/2014 46  >39 mg/dL Final   Comment: According to ATP-III Guidelines, HDL-C >59 mg/dL is considered a negative risk factor for CHD.   [name] VLDL Cholesterol Cal 02/21/2014 44* 5 - 40 mg/dL Final  . LDL Calculated 02/21/2014 89  0 - 99 mg/dL Final                 **Please note reference interval change**  . Chol/HDL Ratio 02/21/2014 3.9  0.0 - 4.4 ratio units Final   Comment:                                   T. Chol/HDL Ratio                                             Men  Women                               1/2 Avg.Risk  3.4    3.3                                   Avg.Risk  5.0    4.4                                2X Avg.Risk  9.6    7.1                                3X Avg.Risk 23.4   11.0   . Glucose 02/21/2014 90  65 - 99 mg/dL Final  . BUN 69/62/9528 15  8 - 27 mg/dL Final  . Creatinine, Ser 02/21/2014 0.96  0.57 - 1.00 mg/dL Final  .  GFR calc non Af Amer 02/21/2014 54* >59 mL/min/1.73 Final  . GFR calc Af Amer 02/21/2014 62  >59 mL/min/1.73 Final  . BUN/Creatinine Ratio 02/21/2014 16  11 - 26 Final  . Sodium 02/21/2014 141  134 - 144 mmol/L Final  . Potassium 02/21/2014 4.1  3.5 - 5.2 mmol/L Final  . Chloride 02/21/2014 98  97 - 108 mmol/L Final  . CO2 02/21/2014 26  18 - 29 mmol/L Final  . Calcium 02/21/2014 10.0  8.7 - 10.3 mg/dL Final  .  Total Protein 02/21/2014 6.7  6.0 - 8.5 g/dL Final  . Albumin 16/10/960411/09/2013 4.4  3.5 - 4.7 g/dL Final  . Globulin, Total 02/21/2014 2.3  1.5 - 4.5 g/dL Final  . Albumin/Globulin Ratio 02/21/2014 1.9  1.1 - 2.5 Final  . Total Bilirubin 02/21/2014 0.7  0.0 - 1.2 mg/dL Final  . Alkaline Phosphatase 02/21/2014 69  39 - 117 IU/L Final  . AST 02/21/2014 20  0 - 40 IU/L Final  . ALT 02/21/2014 12  0 - 32 IU/L Final  . TSH 02/21/2014 3.190  0.450 - 4.500 uIU/mL Final     Assessment/Plan  1. History of CVA (cerebrovascular accident) Recovered. Vision not tested.  2. Bilateral edema of lower extremity improved  3. Essential hypertension controlled  4. Chronic diastolic congestive heart failure compensated  5. History of shingles healed  6. HLD (hyperlipidemia) controlled  7. Loss of hearing, bilateral Audiology referral when she desires

## 2014-03-06 ENCOUNTER — Other Ambulatory Visit: Payer: Self-pay | Admitting: Internal Medicine

## 2014-03-07 NOTE — Telephone Encounter (Signed)
Spoke with patient, patient states she stopped medication cause she thought she did not need it anymore. Patient states she is a little nervous (husband passed away several months ago)

## 2014-03-17 ENCOUNTER — Other Ambulatory Visit: Payer: Self-pay | Admitting: Internal Medicine

## 2014-04-22 ENCOUNTER — Ambulatory Visit (INDEPENDENT_AMBULATORY_CARE_PROVIDER_SITE_OTHER): Payer: Medicare Other | Admitting: Cardiovascular Disease

## 2014-04-22 ENCOUNTER — Encounter: Payer: Self-pay | Admitting: Cardiovascular Disease

## 2014-04-22 VITALS — BP 132/78 | HR 60 | Ht 62.0 in | Wt 158.5 lb

## 2014-04-22 DIAGNOSIS — I5030 Unspecified diastolic (congestive) heart failure: Secondary | ICD-10-CM

## 2014-04-22 DIAGNOSIS — I10 Essential (primary) hypertension: Secondary | ICD-10-CM

## 2014-04-22 DIAGNOSIS — E785 Hyperlipidemia, unspecified: Secondary | ICD-10-CM

## 2014-04-22 NOTE — Assessment & Plan Note (Signed)
History of hyperlipidemia on atorvastatin 20 mg a day. Her most recent lipid profile performed 02/21/14 revealed an LDL of 89 with an HDL of 46.continue current meds at current dosing

## 2014-04-22 NOTE — Assessment & Plan Note (Signed)
History of hypertension with blood pressure measured today 132/78. She is on Tenormin 100 mg and metoprolol. Continue current meds at current dosing

## 2014-04-22 NOTE — Patient Instructions (Signed)
Your physician wants you to follow-up in 1 year with Dr. Berry. You will receive a reminder letter in the mail 2 months in advance. If you do not receive a letter, please call our office to schedule the follow-up appointment.  

## 2014-04-22 NOTE — Progress Notes (Signed)
04/22/2014 Kristin Wilkerson   1925-07-11  [id number]  Primary Physician GREEN, [name], MD Primary Cardiologist: Runell Gess MD [name]   HPI:  81 year old female patient followed by me for hypertension and hyperlipidemia.  She had echo in 2013 and her last stress test was 2011 which was negative for ischemia.since I saw her 6 months ago she's remained clinically stable. [name]  Her husband Kristin Wilkerson passed away on 08/07/2013 and she is appropriately grieving.. She still has atypical chest pain which I do not think is cardiac. She really has no specific complaints. .    Current Outpatient Prescriptions  Medication Sig Dispense Refill  . atorvastatin (LIPITOR) 20 MG tablet TAKE 1 TABLET BY MOUTH EVERY EVENING TO CONTROL CHOLESTEROL 90 tablet 1  . Calcium Carbonate-Vitamin D (CALCIUM 600+D) 600-400 MG-UNIT per tablet Take one tablet by mouth daily for calcium supplement 30 tablet 5  . cholecalciferol (VITAMIN D) 1000 UNITS tablet Take one tablet once daily for vitamin d    . clopidogrel (PLAVIX) 75 MG tablet TAKE 1 TABLET BY MOUTH EVERY MORNING FOR ANTICOAGULATION 90 tablet 1  . DULoxetine (CYMBALTA) 30 MG capsule One daily to help nerves 30 capsule 3  . furosemide (LASIX) 20 MG tablet Take 1 tablet (20 mg total) by mouth daily. 30 tablet 3  . latanoprost (XALATAN) 0.005 % ophthalmic solution INSTILL 1 DROP TO BOTH EYES AT BEDTIME TO TREAT GLAUCOMA 7.5 mL 3  . LORazepam (ATIVAN) 1 MG tablet 1 by mouth daily as needed for anxiety 30 tablet 0  . losartan (COZAAR) 100 MG tablet Take one tablet once daily for blood pressure 90 tablet 3  . metoprolol (LOPRESSOR) 100 MG tablet TAKE 1 TABLET BY MOUTH TWICE DAILY TO CONTORL BLOOD PRESSURE 180 tablet 1   No current facility-administered medications for this visit.    No Known Allergies  History   Social History  . Marital Status: Married    Spouse Name: N/A    Number of Children: 3  . Years of Education: 12th   Occupational  History  . retired    Social History Main Topics  . Smoking status: Never Smoker   . Smokeless tobacco: Not on file  . Alcohol Use: No  . Drug Use: No  . Sexual Activity: No   Other Topics Concern  . Not on file   Social History Narrative     Review of Systems: General: negative for chills, fever, night sweats or weight changes.  Cardiovascular: negative for chest pain, dyspnea on exertion, edema, orthopnea, palpitations, paroxysmal nocturnal dyspnea or shortness of breath Dermatological: negative for rash Respiratory: negative for cough or wheezing Urologic: negative for hematuria Abdominal: negative for nausea, vomiting, diarrhea, bright red blood per rectum, melena, or hematemesis Neurologic: negative for visual changes, syncope, or dizziness All other systems reviewed and are otherwise negative except as noted above.    Blood pressure 132/78, pulse 60, height  (1.575 m), weight 158 lb 8 oz (71.895 kg).  General appearance: alert and no distress Neck: no adenopathy, no carotid bruit, no JVD, supple, symmetrical, trachea midline and thyroid not enlarged, symmetric, no tenderness/mass/nodules Lungs: clear to auscultation bilaterally Heart: regular rate and rhythm, S1, S2 normal, no murmur, click, rub or gallop Extremities: extremities normal, atraumatic, no cyanosis or edema  EKG normal sinus rhythm at 60 with nonspecific ST-T wave changes. I personally reviewed this EKG  ASSESSMENT AND PLAN:   HTN (hypertension) History of hypertension with blood pressure measured  today 132/78. She is on Tenormin 100 mg and metoprolol. Continue current meds at current dosing  HLD (hyperlipidemia) History of hyperlipidemia on atorvastatin 20 mg a day. Her Wilkerson recent lipid profile performed 02/21/14 revealed an LDL of 89 with an HDL of 46.continue current meds at current dosing      Runell GessJonathan J. Harlee Eckroth MD [facility] SaginawFACP,FACC,FAHA, [facility] HospitalFSCAI 04/22/2014 2:05 PM

## 2014-04-25 ENCOUNTER — Encounter (HOSPITAL_COMMUNITY): Payer: Self-pay | Admitting: Emergency Medicine

## 2014-04-25 ENCOUNTER — Emergency Department (HOSPITAL_COMMUNITY)
Admission: EM | Admit: 2014-04-25 | Discharge: 2014-04-25 | Disposition: A | Discharge status: Home / Self Care | Payer: Medicare Other | Attending: Emergency Medicine | Admitting: Emergency Medicine

## 2014-04-25 ENCOUNTER — Emergency Department (HOSPITAL_COMMUNITY): Payer: Medicare Other

## 2014-04-25 DIAGNOSIS — H5712 Ocular pain, left eye: Secondary | ICD-10-CM | POA: Diagnosis present

## 2014-04-25 DIAGNOSIS — Z85038 Personal history of other malignant neoplasm of large intestine: Secondary | ICD-10-CM | POA: Diagnosis not present

## 2014-04-25 DIAGNOSIS — L03213 Periorbital cellulitis: Secondary | ICD-10-CM

## 2014-04-25 DIAGNOSIS — R51 Headache: Secondary | ICD-10-CM | POA: Diagnosis not present

## 2014-04-25 DIAGNOSIS — G4700 Insomnia, unspecified: Secondary | ICD-10-CM | POA: Insufficient documentation

## 2014-04-25 DIAGNOSIS — R000 Tachycardia, unspecified: Secondary | ICD-10-CM | POA: Insufficient documentation

## 2014-04-25 DIAGNOSIS — Z872 Personal history of diseases of the skin and subcutaneous tissue: Secondary | ICD-10-CM | POA: Insufficient documentation

## 2014-04-25 DIAGNOSIS — Z8673 Personal history of transient ischemic attack (TIA), and cerebral infarction without residual deficits: Secondary | ICD-10-CM | POA: Diagnosis not present

## 2014-04-25 DIAGNOSIS — R062 Wheezing: Secondary | ICD-10-CM | POA: Diagnosis not present

## 2014-04-25 DIAGNOSIS — Z8619 Personal history of other infectious and parasitic diseases: Secondary | ICD-10-CM | POA: Diagnosis not present

## 2014-04-25 DIAGNOSIS — Z8739 Personal history of other diseases of the musculoskeletal system and connective tissue: Secondary | ICD-10-CM | POA: Diagnosis not present

## 2014-04-25 DIAGNOSIS — Z79899 Other long term (current) drug therapy: Secondary | ICD-10-CM | POA: Insufficient documentation

## 2014-04-25 DIAGNOSIS — H05012 Cellulitis of left orbit: Secondary | ICD-10-CM | POA: Insufficient documentation

## 2014-04-25 DIAGNOSIS — E559 Vitamin D deficiency, unspecified: Secondary | ICD-10-CM | POA: Insufficient documentation

## 2014-04-25 DIAGNOSIS — I10 Essential (primary) hypertension: Secondary | ICD-10-CM | POA: Diagnosis not present

## 2014-04-25 DIAGNOSIS — R059 Cough, unspecified: Secondary | ICD-10-CM

## 2014-04-25 DIAGNOSIS — Z8719 Personal history of other diseases of the digestive system: Secondary | ICD-10-CM | POA: Diagnosis not present

## 2014-04-25 DIAGNOSIS — F419 Anxiety disorder, unspecified: Secondary | ICD-10-CM | POA: Diagnosis not present

## 2014-04-25 DIAGNOSIS — E785 Hyperlipidemia, unspecified: Secondary | ICD-10-CM | POA: Diagnosis not present

## 2014-04-25 DIAGNOSIS — Z8709 Personal history of other diseases of the respiratory system: Secondary | ICD-10-CM | POA: Insufficient documentation

## 2014-04-25 DIAGNOSIS — R05 Cough: Secondary | ICD-10-CM | POA: Diagnosis not present

## 2014-04-25 LAB — BASIC METABOLIC PANEL
Anion gap: 10 (ref 5–15)
BUN: 14 mg/dL (ref 6–23)
CALCIUM: 9.3 mg/dL (ref 8.4–10.5)
CO2: 27 mmol/L (ref 19–32)
CREATININE: 0.88 mg/dL (ref 0.50–1.10)
Chloride: 103 mEq/L (ref 96–112)
GFR calc Af Amer: 67 mL/min — ABNORMAL LOW (ref >90)
GFR calc non Af Amer: 58 mL/min — ABNORMAL LOW (ref >90)
GLUCOSE: 96 mg/dL (ref 70–99)
POTASSIUM: 4.2 mmol/L (ref 3.5–5.1)
Sodium: 140 mmol/L (ref 135–145)

## 2014-04-25 LAB — CBC WITH DIFFERENTIAL/PLATELET
Basophils Absolute: 0 10*3/uL (ref 0.0–0.1)
Basophils Relative: 0 % (ref 0–1)
EOS PCT: 2 % (ref 0–5)
Eosinophils Absolute: 0.1 10*3/uL (ref 0.0–0.7)
HCT: 40.2 % (ref 36.0–46.0)
Hemoglobin: 13.1 g/dL (ref 12.0–15.0)
Lymphocytes Relative: 31 % (ref 12–46)
Lymphs Abs: 1.6 10*3/uL (ref 0.7–4.0)
MCH: 30.8 pg (ref 26.0–34.0)
MCHC: 32.6 g/dL (ref 30.0–36.0)
MCV: 94.4 fL (ref 78.0–100.0)
Monocytes Absolute: 0.6 10*3/uL (ref 0.1–1.0)
Monocytes Relative: 12 % (ref 3–12)
NEUTROS ABS: 2.8 10*3/uL (ref 1.7–7.7)
NEUTROS PCT: 55 % (ref 43–77)
PLATELETS: 203 10*3/uL (ref 150–400)
RBC: 4.26 MIL/uL (ref 3.87–5.11)
RDW: 13.4 % (ref 11.5–15.5)
WBC: 5.1 10*3/uL (ref 4.0–10.5)

## 2014-04-25 MED ORDER — AMOXICILLIN-POT CLAVULANATE 875-125 MG PO TABS: 1.0000 | ORAL_TABLET | Freq: Two times a day (BID) | ORAL | Status: DC

## 2014-04-25 MED ORDER — ALBUTEROL SULFATE HFA 108 (90 BASE) MCG/ACT IN AERS: 1.0000 | INHALATION_SPRAY | Freq: Four times a day (QID) | RESPIRATORY_TRACT | Status: DC | PRN

## 2014-04-25 MED ORDER — IPRATROPIUM-ALBUTEROL 0.5-2.5 (3) MG/3ML IN SOLN
3.0000 mL | Freq: Once | RESPIRATORY_TRACT | Status: AC
  Administered 2014-04-25: 3 mL via RESPIRATORY_TRACT
  Filled 2014-04-25: qty 3

## 2014-04-25 MED ORDER — TETRACAINE HCL 0.5 % OP SOLN
1.0000 [drp] | Freq: Once | OPHTHALMIC | Status: AC
  Administered 2014-04-25: 1 [drp] via OPHTHALMIC
  Filled 2014-04-25: qty 2

## 2014-04-25 NOTE — Discharge Instructions (Signed)
Take the prescribed medication as directed. Follow-up with Dr. Charlotte SanesMcCuen. Return to the ED for new or worsening symptoms.

## 2014-04-25 NOTE — ED Provider Notes (Signed)
Complains of pain at left periorbital area for the past 2 days, specifically left infraorbital area also reports nonproductive cough for 2 days. No fever. No shortness of breath per no other associated symptoms. No pain with extraocular movement. On exam patient is alert Glasgow Coma Score 15 HEENT exam no facial asymmetry. There is mild redness and tenderness at left infraorbital area and she has no pain on extraocular movement no subconjunctival erythema. Lungs scant diffuse rhonchi no risk for distress, speaks in paragraphs Clive Parcel and history consistent with periorbital cellulitis  Doug SouSam Saba Neuman, MD 04/25/14 [phone]

## 2014-04-25 NOTE — ED Provider Notes (Signed)
CSN: [id number]     Arrival date & time 04/25/14  1045 History   First MD Initiated Contact with Patient 04/25/14 1122     Chief Complaint  Patient presents with  . Eye Pain     (Consider location/radiation/quality/duration/timing/severity/associated sxs/prior Treatment) Patient is a 81 y.o. female presenting with eye pain. The history is provided by the patient.  Eye Pain   This is an 80 year old female with past medical history significant for hypertension, insomnia, sciatica, hyperlipidemia, anxiety, glaucoma, presenting to the ED for left periorbital and facial pain for 2 days. Patient denies any trauma to the eye. No chemical or foreign body exposure. She denies any drainage or excessive tearing. Patient states now the pain is spreading into the left side of her face. No facial or neck swelling.  Patient also states she has had a nonproductive cough. She denies any fever or chills. No chest pain or shortness of breath.  Patient's ophthalmologist is Dr. Charlotte Sanes.  Past Medical History  Diagnosis Date  . Hypertension   . Abnormality of gait   . Disturbance of skin sensation   . Urinary frequency   . Tachycardia, unspecified   . Internal hemorrhoids without mention of complication   . Unspecified pruritic disorder   . Insomnia, unspecified   . Synovial cyst of popliteal space   . Spinal stenosis, other region   . Sciatica   . Lumbago   . Unspecified vitamin D deficiency   . Anxiety   . Hyperlipidemia   . Abnormality of gait   . Unspecified sinusitis (chronic)   . Unspecified pruritic disorder   . Unspecified viral infection, in conditions classified elsewhere and of unspecified site   . Pain in joint, lower leg   . Depressive disorder, not elsewhere classified   . Unspecified glaucoma   . Reflux esophagitis   . Diverticulosis of colon (without mention of hemorrhage)   . Pain in joint, site unspecified   . Myalgia and myositis, unspecified   . Osteoporosis, unspecified   .  Other malaise and fatigue   . Cervicalgia   . Senile osteoporosis   . Benign neoplasm of colon   . H/O echocardiogram 09/01/11    EF >55%, mild MR, RV systolic pressure elevated at 40-98 mmHg, mod TR  . Normal cardiac stress test 11/16/09    low risk scan, EF  77%  . Stroke   . Shortness of breath    Past Surgical History  Procedure Laterality Date  . Lumbar laminectomy  12/04/2012    Complete decompressive L3-4 and L4-5--Dr. Darrelyn Hillock  . Abdominal hysterectomy    . Cholecystectomy    . Cardiovascular stress test  11/16/2009    Perfusion defect seen in inferior myocardial region consistent with diaphragmatic attenuation. Remaining myocardium demonstrates normalmyocardial perfusion with no evidence of ischemia or infarct. No ECG changes. EKG negative for ischemia.  . Transthoracic echocardiogram  09/01/2011    EF >55% and moderate tricuspid valve regurg.   Family History  Problem Relation Age of Onset  . Cancer Sister   . Stroke Mother   . Stroke Father    History  Substance Use Topics  . Smoking status: Never Smoker   . Smokeless tobacco: Not on file  . Alcohol Use: No   OB History    No data available     Review of Systems  Eyes: Positive for pain.  All other systems reviewed and are negative.     Allergies  Review of patient's allergies indicates  no known allergies.  Home Medications   Prior to Admission medications   Medication Sig Start Date End Date Taking? Authorizing Provider  atorvastatin (LIPITOR) 20 MG tablet TAKE 1 TABLET BY MOUTH EVERY EVENING TO CONTROL CHOLESTEROL 03/17/14   Kimber RelicArthur G Green, MD  Calcium Carbonate-Vitamin D (CALCIUM 600+D) 600-400 MG-UNIT per tablet Take one tablet by mouth daily for calcium supplement 07/02/13   Kimber RelicArthur G Green, MD  cholecalciferol (VITAMIN D) 1000 UNITS tablet Take one tablet once daily for vitamin d    Historical Provider, MD  clopidogrel (PLAVIX) 75 MG tablet TAKE 1 TABLET BY MOUTH EVERY MORNING FOR ANTICOAGULATION 03/17/14    Kimber RelicArthur G Green, MD  DULoxetine (CYMBALTA) 30 MG capsule One daily to help nerves 01/07/14   Kimber RelicArthur G Green, MD  furosemide (LASIX) 20 MG tablet Take 1 tablet (20 mg total) by mouth daily. 02/13/14   Sharon SellerJessica K Eubanks, NP  latanoprost (XALATAN) 0.005 % ophthalmic solution INSTILL 1 DROP TO BOTH EYES AT BEDTIME TO TREAT GLAUCOMA 02/17/14   Kimber RelicArthur G Green, MD  LORazepam (ATIVAN) 1 MG tablet 1 by mouth daily as needed for anxiety 03/07/14   Kimber RelicArthur G Green, MD  losartan (COZAAR) 100 MG tablet Take one tablet once daily for blood pressure 03/20/13   Kimber RelicArthur G Green, MD  metoprolol (LOPRESSOR) 100 MG tablet TAKE 1 TABLET BY MOUTH TWICE DAILY TO CONTORL BLOOD PRESSURE 03/17/14   Kimber RelicArthur G Green, MD   BP 147/88 mmHg  Pulse 72  Temp(Src) 97.2 F (36.2 C) (Oral)  Ht 5' (1.524 m)  Wt 157 lb (71.215 kg)  BMI 30.66 kg/m2  SpO2 98%   Physical Exam  Constitutional: She is oriented to person, place, and time. She appears well-developed and well-nourished.  HENT:  Head: Normocephalic and atraumatic.  Mouth/Throat: Oropharynx is clear and moist.  Eyes: Conjunctivae, EOM and lids are normal. Pupils are equal, round, and reactive to light.  Right eye normal Left eye with mild erythema of upper and lower lids; no significant swelling; no conjunctival injection or hemorrhage; PERRL; EOMs intact and non-painful  Neck: Normal range of motion.  Cardiovascular: Normal rate, regular rhythm and normal heart sounds.   Pulmonary/Chest: Effort normal and breath sounds normal. No respiratory distress. She has no wheezes. She has no rhonchi.  Respirations unlabored, lungs clear, speaking in full complete sentences without difficulty  Abdominal: Soft. Bowel sounds are normal.  Musculoskeletal: Normal range of motion.  Neurological: She is alert and oriented to person, place, and time.  Skin: Skin is warm and dry.  Psychiatric: She has a normal mood and affect.  Nursing note and vitals reviewed.   ED Course   Procedures (including critical care time) Labs Review Labs Reviewed  BASIC METABOLIC PANEL - Abnormal; Notable for the following:    GFR calc non Af Amer 58 (*)    GFR calc Af Amer 67 (*)    All other components within normal limits  CBC WITH DIFFERENTIAL    Imaging Review Dg Chest 2 View  04/25/2014   CLINICAL DATA:  Cough and wheezing  EXAM: CHEST  2 VIEW  COMPARISON:  11/26/2009  FINDINGS: Cardiac shadow is stable. The lungs are well aerated bilaterally. No focal infiltrate or sizable effusion is seen. No acute bony abnormality is noted. Mild interstitial changes are seen  IMPRESSION: No active cardiopulmonary disease.   Electronically Signed   By: Alcide CleverMark  Lukens M.D.   On: 04/25/2014 12:55     EKG Interpretation None  MDM   Final diagnoses:  Cough  Periorbital cellulitis of left eye   81 year old female with left periorbital and facial pain for the past 2 days. On exam patient is in no acute distress. She has some mild erythema surrounding her left eye. Conjunctiva is not injected, no hemorrhage or foreign body.  Denies visual disturbance. Visual Acuity - Bilateral Near: 20/40 (with glasses) ; R Near: 20/30 (with glasses) ; L Near: 20/50 (with glasses).  Basic lab work reassuring.  CXR clear.  Physical exam findings and symptoms most consistent with peri-orbital cellulitis.  Patient will be started on augmentin.  Also given albuterol inhaler for PRN use.  Encouraged close FU with Dr. Charlotte Sanes.  Discussed plan with patient, he/she acknowledged understanding and agreed with plan of care.  Return precautions given for new or worsening symptoms.  Case discussed with attending physician, Dr. Ethelda Chick, who evaluated patient and agrees with assessment and plan of care.  Garlon Hatchet, PA-C 04/25/14 1519  Doug Sou, MD 04/25/14 [phone]

## 2014-04-25 NOTE — ED Notes (Signed)
Pt here with left eye pain and facial pain x 2 days; pt sts some non productive cough

## 2014-04-27 ENCOUNTER — Other Ambulatory Visit: Payer: Self-pay | Admitting: Internal Medicine

## 2014-04-30 DIAGNOSIS — Z961 Presence of intraocular lens: Secondary | ICD-10-CM | POA: Diagnosis not present

## 2014-04-30 DIAGNOSIS — H05012 Cellulitis of left orbit: Secondary | ICD-10-CM | POA: Diagnosis not present

## 2014-04-30 DIAGNOSIS — H401232 Low-tension glaucoma, bilateral, moderate stage: Secondary | ICD-10-CM | POA: Diagnosis not present

## 2014-05-18 DIAGNOSIS — R0989 Other specified symptoms and signs involving the circulatory and respiratory systems: Secondary | ICD-10-CM | POA: Diagnosis not present

## 2014-05-18 DIAGNOSIS — J029 Acute pharyngitis, unspecified: Secondary | ICD-10-CM | POA: Diagnosis not present

## 2014-05-18 DIAGNOSIS — R000 Tachycardia, unspecified: Secondary | ICD-10-CM | POA: Diagnosis not present

## 2014-05-19 ENCOUNTER — Other Ambulatory Visit: Payer: Self-pay | Admitting: Internal Medicine

## 2014-06-10 DIAGNOSIS — N390 Urinary tract infection, site not specified: Secondary | ICD-10-CM | POA: Diagnosis not present

## 2014-06-17 DIAGNOSIS — H3531 Nonexudative age-related macular degeneration: Secondary | ICD-10-CM | POA: Diagnosis not present

## 2014-06-17 DIAGNOSIS — H4011X1 Primary open-angle glaucoma, mild stage: Secondary | ICD-10-CM | POA: Diagnosis not present

## 2014-06-23 ENCOUNTER — Other Ambulatory Visit: Payer: Self-pay | Admitting: Internal Medicine

## 2014-06-23 DIAGNOSIS — M1711 Unilateral primary osteoarthritis, right knee: Secondary | ICD-10-CM | POA: Diagnosis not present

## 2014-06-23 DIAGNOSIS — M5136 Other intervertebral disc degeneration, lumbar region: Secondary | ICD-10-CM | POA: Diagnosis not present

## 2014-06-23 DIAGNOSIS — M1712 Unilateral primary osteoarthritis, left knee: Secondary | ICD-10-CM | POA: Diagnosis not present

## 2014-07-02 ENCOUNTER — Ambulatory Visit (INDEPENDENT_AMBULATORY_CARE_PROVIDER_SITE_OTHER): Payer: Medicare Other | Admitting: Internal Medicine

## 2014-07-02 ENCOUNTER — Encounter: Payer: Self-pay | Admitting: Internal Medicine

## 2014-07-02 VITALS — BP 134/82 | HR 53 | Temp 97.7°F | Ht 60.0 in | Wt 157.6 lb

## 2014-07-02 DIAGNOSIS — H9193 Unspecified hearing loss, bilateral: Secondary | ICD-10-CM

## 2014-07-02 DIAGNOSIS — M545 Low back pain, unspecified: Secondary | ICD-10-CM

## 2014-07-02 DIAGNOSIS — M25561 Pain in right knee: Secondary | ICD-10-CM

## 2014-07-02 DIAGNOSIS — I10 Essential (primary) hypertension: Secondary | ICD-10-CM | POA: Diagnosis not present

## 2014-07-02 DIAGNOSIS — Z23 Encounter for immunization: Secondary | ICD-10-CM | POA: Diagnosis not present

## 2014-07-02 DIAGNOSIS — I5030 Unspecified diastolic (congestive) heart failure: Secondary | ICD-10-CM | POA: Diagnosis not present

## 2014-07-02 NOTE — Progress Notes (Signed)
Patient ID: Kristin Wilkerson, female   DOB: [birth year], 81 y.o.   MRN: [id number]    Facility  [facility]    Place of Service:   OFFICE   No Known Allergies  Chief Complaint  Patient presents with  . Medical Management of Chronic Issues    4 Month Follow up    HPI:  Essential hypertension: Controlled  Deaf, bilateral - patient is requesting referral to Audiology  Right knee pain: Chronic and unchanged. Knee buckles sometimes. She has not tried a knee brace.  Midline low back pain without sciatica: Satisfactorily controlled  Diastolic congestive heart failure, unspecified congestive heart failure chronicity: Compensated  Need for vaccination with 13-polyvalent pneumococcal conjugate vaccine -    Medications: Patient's Medications  New Prescriptions   No medications on file  Previous Medications   ALBUTEROL (PROVENTIL HFA;VENTOLIN HFA) 108 (90 BASE) MCG/ACT INHALER    Inhale 1-2 puffs into the lungs every 6 (six) hours as needed for wheezing.   AMOXICILLIN-CLAVULANATE (AUGMENTIN) 875-125 MG PER TABLET    Take 1 tablet by mouth every 12 (twelve) hours.   ATORVASTATIN (LIPITOR) 20 MG TABLET    TAKE 1 TABLET BY MOUTH EVERY EVENING TO CONTROL CHOLESTEROL   CALCIUM CARBONATE-VITAMIN D (CALCIUM 600+D) 600-400 MG-UNIT PER TABLET    Take one tablet by mouth daily for calcium supplement   CHOLECALCIFEROL (VITAMIN D) 1000 UNITS TABLET    Take one tablet once daily for vitamin d   CLOPIDOGREL (PLAVIX) 75 MG TABLET    TAKE 1 TABLET BY MOUTH EVERY MORNING FOR ANTICOAGULATION   DULOXETINE (CYMBALTA) 30 MG CAPSULE    One daily to help nerves   FUROSEMIDE (LASIX) 20 MG TABLET    Take 1 tablet (20 mg total) by mouth daily.   LATANOPROST (XALATAN) 0.005 % OPHTHALMIC SOLUTION    INSTILL 1 DROP TO BOTH EYES AT BEDTIME TO TREAT GLAUCOMA   LORAZEPAM (ATIVAN) 1 MG TABLET    1 by mouth daily as needed for anxiety   LOSARTAN (COZAAR) 100 MG TABLET    TAKE 1 TABLET BY MOUTH ONCE DAILY FOR BLOOD PRESSURE   METOPROLOL (LOPRESSOR) 100 MG TABLET    TAKE 1 TABLET BY MOUTH TWICE DAILY TO CONTORL BLOOD PRESSURE  Modified Medications   No medications on file  Discontinued Medications   DULOXETINE (CYMBALTA) 30 MG CAPSULE    TAKE ONE CAPSULE BY MOUTH EVERY DAY     Review of Systems  Constitutional: Negative.   HENT: Positive for hearing loss.   Eyes:       Right hemianopsia  Respiratory: Negative for cough and shortness of breath.   Cardiovascular: Positive for leg swelling. Negative for chest pain and palpitations.  Gastrointestinal: Negative.   Endocrine: Negative.   Genitourinary: Positive for frequency. Negative for urgency.       Nocturia  Musculoskeletal: Positive for myalgias (legs hurt in calves) and arthralgias.  Skin: Negative for rash.  Allergic/Immunologic: Negative.   Neurological: Negative for dizziness, tremors, speech difficulty, light-headedness and headaches.       Tingling sensations in lower extremities. TIA/CVA 03/07/2013. Has had resolution of the decreased vision in the right eye and the left facial droop.  Hematological: Negative.   Psychiatric/Behavioral:       Can't get her husband's death out of her mind-reports she misses her husband    Filed Vitals:   07/02/14 1148  BP: 134/82  Pulse: 53  Temp: 97.7 F (36.5 C)  TempSrc: Oral  Height: 5' (1.524 m)  Weight: 157 lb 9.6 oz (71.487 kg)   Body mass index is 30.78 kg/(m^2).  Physical Exam  Constitutional: She is oriented to person, place, and time. She appears well-developed and well-nourished. No distress.  HENT:  Head: Normocephalic.  Right Ear: External ear normal.  Left Ear: External ear normal.  Nose: Nose normal.  Bilateral loss of hearing  Eyes: Conjunctivae and EOM are normal. Pupils are equal, round, and reactive to light.  Mild right hemianopsia laterally  Neck: Normal range of motion. Neck supple. No JVD present. No tracheal deviation present. No thyromegaly present.  Cardiovascular: Exam  reveals no distant heart sounds and no friction rub.   Murmur heard.  Systolic murmur is present with a grade of 2/6  Holosystolic  Pulmonary/Chest: No respiratory distress. She has no wheezes. She has no rales. She exhibits no tenderness.  Abdominal: Soft. Bowel sounds are normal. She exhibits no distension and no mass. There is no tenderness.  Musculoskeletal: Normal range of motion. She exhibits no edema or tenderness.  Lymphadenopathy:    She has no cervical adenopathy.  Neurological: She is alert and oriented to person, place, and time. She has normal reflexes.  Skin: Skin is warm and dry. Rash noted. No erythema. No pallor.  Psychiatric: She has a normal mood and affect. Her behavior is normal. Judgment and thought content normal.     Labs reviewed: Admission on 04/25/2014, Discharged on 04/25/2014  Component Date Value Ref Range Status  . WBC 04/25/2014 5.1  4.0 - 10.5 K/uL Final  . RBC 04/25/2014 4.26  3.87 - 5.11 MIL/uL Final  . Hemoglobin 04/25/2014 13.1  12.0 - 15.0 g/dL Final  . HCT 04/25/2014 40.2  36.0 - 46.0 % Final  . MCV 04/25/2014 94.4  78.0 - 100.0 fL Final  . MCH 04/25/2014 30.8  26.0 - 34.0 pg Final  . MCHC 04/25/2014 32.6  30.0 - 36.0 g/dL Final  . RDW 04/25/2014 13.4  11.5 - 15.5 % Final  . Platelets 04/25/2014 203  150 - 400 K/uL Final  . Neutrophils Relative % 04/25/2014 55  43 - 77 % Final  . Neutro Abs 04/25/2014 2.8  1.7 - 7.7 K/uL Final  . Lymphocytes Relative 04/25/2014 31  12 - 46 % Final  . Lymphs Abs 04/25/2014 1.6  0.7 - 4.0 K/uL Final  . Monocytes Relative 04/25/2014 12  3 - 12 % Final  . Monocytes Absolute 04/25/2014 0.6  0.1 - 1.0 K/uL Final  . Eosinophils Relative 04/25/2014 2  0 - 5 % Final  . Eosinophils Absolute 04/25/2014 0.1  0.0 - 0.7 K/uL Final  . Basophils Relative 04/25/2014 0  0 - 1 % Final  . Basophils Absolute 04/25/2014 0.0  0.0 - 0.1 K/uL Final  . Sodium 04/25/2014 140  135 - 145 mmol/L Final   Please note change in reference  range.  . Potassium 04/25/2014 4.2  3.5 - 5.1 mmol/L Final   Please note change in reference range.  . Chloride 04/25/2014 103  96 - 112 mEq/L Final  . CO2 04/25/2014 27  19 - 32 mmol/L Final  . Glucose, Bld 04/25/2014 96  70 - 99 mg/dL Final  . BUN 04/25/2014 14  6 - 23 mg/dL Final  . Creatinine, Ser 04/25/2014 0.88  0.50 - 1.10 mg/dL Final  . Calcium 04/25/2014 9.3  8.4 - 10.5 mg/dL Final  . GFR calc non Af Amer 04/25/2014 58* >90 mL/min Final  . GFR calc Af Amer 04/25/2014 67* >90 mL/min   Final   Comment: (NOTE) The eGFR has been calculated using the CKD EPI equation. This calculation has not been validated in all clinical situations. eGFR's persistently <90 mL/min signify possible Chronic Kidney Disease.   . Anion gap 04/25/2014 10  5 - 15 Final     Assessment/Plan  1. Deaf, bilateral - Ambulatory referral to Audiology  2. Right knee pain Try soft knee brace  3. Midline low back pain without sciatica Continue to follow. No new orders  4. Essential hypertension Controlled  5. Diastolic congestive heart failure, unspecified congestive heart failure chronicity Compensated  6. Need for vaccination with 13-polyvalent pneumococcal conjugate vaccine - Pneumococcal conjugate vaccine 13-valent   [facility]. Forms were filled out in the course of this visit.  

## 2014-07-14 DIAGNOSIS — M1711 Unilateral primary osteoarthritis, right knee: Secondary | ICD-10-CM | POA: Diagnosis not present

## 2014-07-20 ENCOUNTER — Other Ambulatory Visit: Payer: Self-pay | Admitting: Nurse Practitioner

## 2014-07-24 DIAGNOSIS — H903 Sensorineural hearing loss, bilateral: Secondary | ICD-10-CM | POA: Diagnosis not present

## 2014-08-06 ENCOUNTER — Ambulatory Visit (INDEPENDENT_AMBULATORY_CARE_PROVIDER_SITE_OTHER): Payer: Medicare Other | Admitting: Internal Medicine

## 2014-08-06 ENCOUNTER — Encounter: Payer: Self-pay | Admitting: Internal Medicine

## 2014-08-06 VITALS — BP 146/84 | HR 57 | Temp 97.7°F | Ht 60.0 in | Wt 160.6 lb

## 2014-08-06 DIAGNOSIS — J40 Bronchitis, not specified as acute or chronic: Secondary | ICD-10-CM | POA: Diagnosis not present

## 2014-08-06 MED ORDER — FLUTICASONE-SALMETEROL 250-50 MCG/DOSE IN AEPB: INHALATION_SPRAY | RESPIRATORY_TRACT | Status: DC

## 2014-08-06 MED ORDER — CLINDAMYCIN HCL 300 MG PO CAPS: ORAL_CAPSULE | ORAL | Status: DC

## 2014-08-06 MED ORDER — CETIRIZINE HCL 10 MG PO TABS: 10.0000 mg | ORAL_TABLET | Freq: Every day | ORAL | Status: DC

## 2014-08-06 NOTE — Progress Notes (Signed)
Patient ID: Kristin Wilkerson, female   DOB: 20-Jun-1925, 81 y.o.   MRN: [id number]    Facility  [facility]    Place of Service:   OFFICE    No Known Allergies  Chief Complaint  Patient presents with  . Acute Visit    Complains of Sore Throat and Wheezing    HPI:  Tracheobronchitis: sore throat and wheeze. Sputum in small amounts. No fever.  Medications: Patient's Medications  New Prescriptions   No medications on file  Previous Medications   ATORVASTATIN (LIPITOR) 20 MG TABLET    TAKE 1 TABLET BY MOUTH EVERY EVENING TO CONTROL CHOLESTEROL   CALCIUM CARBONATE-VITAMIN D (CALCIUM 600+D) 600-400 MG-UNIT PER TABLET    Take one tablet by mouth daily for calcium supplement   CHOLECALCIFEROL (VITAMIN D) 1000 UNITS TABLET    Take one tablet once daily for vitamin d   CLOPIDOGREL (PLAVIX) 75 MG TABLET    TAKE 1 TABLET BY MOUTH EVERY MORNING FOR ANTICOAGULATION   DULOXETINE (CYMBALTA) 30 MG CAPSULE    One daily to help nerves   FUROSEMIDE (LASIX) 20 MG TABLET    TAKE 1 TABLET BY MOUTH EVERY DAY   LATANOPROST (XALATAN) 0.005 % OPHTHALMIC SOLUTION    INSTILL 1 DROP TO BOTH EYES AT BEDTIME TO TREAT GLAUCOMA   LORAZEPAM (ATIVAN) 1 MG TABLET    1 by mouth daily as needed for anxiety   LOSARTAN (COZAAR) 100 MG TABLET    TAKE 1 TABLET BY MOUTH ONCE DAILY FOR BLOOD PRESSURE   METOPROLOL (LOPRESSOR) 100 MG TABLET    TAKE 1 TABLET BY MOUTH TWICE DAILY TO CONTORL BLOOD PRESSURE  Modified Medications   No medications on file  Discontinued Medications   ALBUTEROL (PROVENTIL HFA;VENTOLIN HFA) 108 (90 BASE) MCG/ACT INHALER    Inhale 1-2 puffs into the lungs every 6 (six) hours as needed for wheezing.   AMOXICILLIN-CLAVULANATE (AUGMENTIN) 875-125 MG PER TABLET    Take 1 tablet by mouth every 12 (twelve) hours.     Review of Systems  Constitutional: Negative.   HENT: Positive for hearing loss.   Eyes:       Right hemianopsia  Respiratory: Positive for cough and wheezing. Negative for shortness of breath.     Cardiovascular: Positive for leg swelling. Negative for chest pain and palpitations.  Gastrointestinal: Negative.   Endocrine: Negative.   Genitourinary: Positive for frequency. Negative for urgency.       Nocturia  Musculoskeletal: Positive for myalgias (legs hurt in calves) and arthralgias.  Skin: Negative for rash.  Allergic/Immunologic: Negative.   Neurological: Negative for dizziness, tremors, speech difficulty, light-headedness and headaches.       Tingling sensations in lower extremities. TIA/CVA 03/07/2013. Has had resolution of the decreased vision in the right eye and the left facial droop.  Hematological: Negative.   Psychiatric/Behavioral:       Can't get her husband's death out of her mind-reports she misses her husband    Filed Vitals:   08/06/14 1148  BP: 146/84  Pulse: 57  Temp: 97.7 F (36.5 C)  TempSrc: Oral  Height: 5' (1.524 m)  Weight: 160 lb 9.6 oz (72.848 kg)  SpO2: 98%   Body mass index is 31.37 kg/(m^2).  Physical Exam  Constitutional: She is oriented to person, place, and time. She appears well-developed and well-nourished. No distress.  HENT:  Head: Normocephalic.  Right Ear: External ear normal.  Left Ear: External ear normal.  Nose: Nose normal.  Bilateral loss of hearing  Eyes: Conjunctivae and EOM are normal. Pupils are equal, round, and reactive to light.  Mild right hemianopsia laterally  Neck: Normal range of motion. Neck supple. No JVD present. No tracheal deviation present. No thyromegaly present.  Cardiovascular: Exam reveals no distant heart sounds and no friction rub.   Murmur heard.  Systolic murmur is present with a grade of 2/6  Holosystolic  Pulmonary/Chest: No respiratory distress. She has wheezes. She has no rales. She exhibits no tenderness.  Abdominal: Soft. Bowel sounds are normal. She exhibits no distension and no mass. There is no tenderness.  Musculoskeletal: Normal range of motion. She exhibits no edema or tenderness.   Lymphadenopathy:    She has no cervical adenopathy.  Neurological: She is alert and oriented to person, place, and time. She has normal reflexes.  Skin: Skin is warm and dry. Rash noted. No erythema. No pallor.  Psychiatric: She has a normal mood and affect. Her behavior is normal. Judgment and thought content normal.     Labs reviewed: No visits with results within 3 Month(s) from this visit. Latest known visit with results is:  Admission on 04/25/2014, Discharged on 04/25/2014  Component Date Value Ref Range Status  . WBC 04/25/2014 5.1  4.0 - 10.5 K/uL Final  . RBC 04/25/2014 4.26  3.87 - 5.11 MIL/uL Final  . Hemoglobin 04/25/2014 13.1  12.0 - 15.0 g/dL Final  . HCT 04/25/2014 40.2  36.0 - 46.0 % Final  . MCV 04/25/2014 94.4  78.0 - 100.0 fL Final  . MCH 04/25/2014 30.8  26.0 - 34.0 pg Final  . MCHC 04/25/2014 32.6  30.0 - 36.0 g/dL Final  . RDW 04/25/2014 13.4  11.5 - 15.5 % Final  . Platelets 04/25/2014 203  150 - 400 K/uL Final  . Neutrophils Relative % 04/25/2014 55  43 - 77 % Final  . Neutro Abs 04/25/2014 2.8  1.7 - 7.7 K/uL Final  . Lymphocytes Relative 04/25/2014 31  12 - 46 % Final  . Lymphs Abs 04/25/2014 1.6  0.7 - 4.0 K/uL Final  . Monocytes Relative 04/25/2014 12  3 - 12 % Final  . Monocytes Absolute 04/25/2014 0.6  0.1 - 1.0 K/uL Final  . Eosinophils Relative 04/25/2014 2  0 - 5 % Final  . Eosinophils Absolute 04/25/2014 0.1  0.0 - 0.7 K/uL Final  . Basophils Relative 04/25/2014 0  0 - 1 % Final  . Basophils Absolute 04/25/2014 0.0  0.0 - 0.1 K/uL Final  . Sodium 04/25/2014 140  135 - 145 mmol/L Final   Please note change in reference range.  . Potassium 04/25/2014 4.2  3.5 - 5.1 mmol/L Final   Please note change in reference range.  . Chloride 04/25/2014 103  96 - 112 mEq/L Final  . CO2 04/25/2014 27  19 - 32 mmol/L Final  . Glucose, Bld 04/25/2014 96  70 - 99 mg/dL Final  . BUN 04/25/2014 14  6 - 23 mg/dL Final  . Creatinine, Ser 04/25/2014 0.88  0.50 -  1.10 mg/dL Final  . Calcium 04/25/2014 9.3  8.4 - 10.5 mg/dL Final  . GFR calc non Af Amer 04/25/2014 58* >90 mL/min Final  . GFR calc Af Amer 04/25/2014 67* >90 mL/min Final   Comment: (NOTE) The eGFR has been calculated using the CKD EPI equation. This calculation has not been validated in all clinical situations. eGFR's persistently <90 mL/min signify possible Chronic Kidney Disease.   . Anion gap 04/25/2014 10  5 - 15 Final  Assessment/Plan  1. Tracheobronchitis - cetirizine (ZYRTEC) 10 MG tablet; Take 1 tablet (10 mg total) by mouth daily.  Dispense: 30 tablet; Refill: 11 - clindamycin (CLEOCIN) 300 MG capsule; One 3 times daily for infection  Dispense: 21 capsule; Refill: 0 - Fluticasone-Salmeterol (ADVAIR DISKUS) 250-50 MCG/DOSE AEPB; Inhale one puff in lungs every 12 hours  Dispense: 60 each; Refill: 1

## 2014-08-06 NOTE — Patient Instructions (Signed)
New prescriptions were sent to your pharmacy. 

## 2014-08-12 ENCOUNTER — Encounter: Payer: Self-pay | Admitting: Neurology

## 2014-08-12 ENCOUNTER — Ambulatory Visit (INDEPENDENT_AMBULATORY_CARE_PROVIDER_SITE_OTHER): Payer: Medicare Other | Admitting: Neurology

## 2014-08-12 VITALS — BP 145/76 | HR 57 | Wt 161.0 lb

## 2014-08-12 DIAGNOSIS — I6529 Occlusion and stenosis of unspecified carotid artery: Secondary | ICD-10-CM

## 2014-08-12 NOTE — Patient Instructions (Signed)
I had a long d/w patient about his recent stroke, risk for recurrent stroke/TIAs, personally independently reviewed imaging studies and stroke evaluation results and answered questions.Continue Plavix  for secondary stroke prevention and maintain strict control of hypertension with blood pressure goal below 130/90, diabetes with hemoglobin A1c goal below 6.5% and lipids with LDL cholesterol goal below 100 mg/dL. I also advised the patient to eat a healthy diet with plenty of whole grains, cereals, fruits and vegetables, exercise regularly and maintain ideal body weight . Check screening carotid ultrasound study. Continue follow-up with family physician for tracheobronchitis .Followup in the future with me in  1 year or call earlier if necessary.

## 2014-08-12 NOTE — Progress Notes (Signed)
PATIENT: Kristin Wilkerson DOB: [birth year]  REASON FOR VISIT: routine follow up for stroke HISTORY FROM: patient  HISTORY OF PRESENT ILLNESS: SYLIVA Wilkerson is an 81 y.o. female history of hypertension and hyperlipidemia. Kristin Wilkerson comes to the office today for first post-hospital stroke visit. She is accompanied by one of her sons. She presented with acute onset of blurred vision and numbness involving left side of her face as well as slight facial droop on 02/28/2013. Has no previous history of stroke nor TIA. Patient was on aspirin 81 mg per day. CT scan of the head showed no acute intracranial abnormality. Deficits resolved essentially during the emergency room stay, except for minimal residual blurring of vision. NIH stroke score was 0. Patient was not a TPA candidate secondary to Rapidly resolving deficits. She was admitted for further evaluation and treatment. MRI of the brain did show an Acute ischemic infarct involving the right occipital lobe. No evidence of hemorrhagic conversion. Moderate age-related atrophy. MRA of the brain showed Multi focal irregularity involving the middle cerebral and posterior cerebral arteries, likely related to underlying atherosclerotic disease. No high-grade flow-limiting stenosis identified. No MRA evidence of intracranial aneurysm. 2D Echocardiogram showed an ejection fraction 55-60%, no cardiac source of emboli identified. Carotid Doppler showed Bilateral 1-39% ICA stenosis.   She does not feel that she was very affected from the stroke, but she has seen her eye doctor and knows that her vision has worsened; she needed a new eye glass prescription. She states her BP is usually well controlled and her BP is 144/76 today in our office. She is tolerating Plavix well without any significant bruising. She has no weakness or numbness in her extremities and ambulates well.   Update 11/28/13 (LL): Since last visit, Kristin Wilkerson' husband passed in March. She is accompanied  by a son today. She has been put on Duloxetine to help her with grief. She has had no recurrent stroke or TIA symptoms. Most recent LDL was 85. She is not diabetic. Her blood pressure is well controlled, it is 125/70 in the office today.  She is tolerating Plavix well without any significant bruising. She has no complaints today. Update 08/12/2014 : She returns for follow-up after last visit 8 months ago. She continues to do well from stroke standpoint without recurrent stroke or TIA symptoms. She has been having a dry cough and hoarse voice and saw her primary physician and was diagnosed with tracheobronchitis. She has persistent partial vision loss in the left eye which is old from remote blood clot she had more than 50 years ago. She is tolerating Plavix quite well with only minor bruising but no bleeding episodes. She also remains on Lipitor which is tolerating well without muscle aches or pains. She is not sure when she had lipid profile checked but plans to have it checked at next visit with her primary physician. She has not had any recurrent stroke or TIA symptoms REVIEW OF SYSTEMS: Full 14 system review of systems performed and notable only for: Fatigue, hearing loss, runny nose, trouble swallowing, hoarse voice, shortness of breath, leg swelling, excessive thirst, frequency of urination, joint pain and swelling, aching muscles, walking difficulty, easy bruising and all other systems negative  ALLERGIES: No Known Allergies  HOME MEDICATIONS: Outpatient Prescriptions Prior to Visit  Medication Sig Dispense Refill  . atorvastatin (LIPITOR) 20 MG tablet TAKE 1 TABLET BY MOUTH EVERY EVENING TO CONTROL CHOLESTEROL 90 tablet 1  . Calcium Carbonate-Vitamin D (CALCIUM 600+D)  600-400 MG-UNIT per tablet Take one tablet by mouth daily for calcium supplement 30 tablet 5  . cetirizine (ZYRTEC) 10 MG tablet Take 1 tablet (10 mg total) by mouth daily. 30 tablet 11  . cholecalciferol (VITAMIN D) 1000 UNITS  tablet Take one tablet once daily for vitamin d    . clindamycin (CLEOCIN) 300 MG capsule One 3 times daily for infection 21 capsule 0  . clopidogrel (PLAVIX) 75 MG tablet TAKE 1 TABLET BY MOUTH EVERY MORNING FOR ANTICOAGULATION 90 tablet 1  . DULoxetine (CYMBALTA) 30 MG capsule One daily to help nerves 30 capsule 3  . Fluticasone-Salmeterol (ADVAIR DISKUS) 250-50 MCG/DOSE AEPB Inhale one puff in lungs every 12 hours 60 each 1  . furosemide (LASIX) 20 MG tablet TAKE 1 TABLET BY MOUTH EVERY DAY 30 tablet 5  . latanoprost (XALATAN) 0.005 % ophthalmic solution INSTILL 1 DROP TO BOTH EYES AT BEDTIME TO TREAT GLAUCOMA 7.5 mL 3  . LORazepam (ATIVAN) 1 MG tablet 1 by mouth daily as needed for anxiety 30 tablet 0  . losartan (COZAAR) 100 MG tablet TAKE 1 TABLET BY MOUTH ONCE DAILY FOR BLOOD PRESSURE 90 tablet 2  . metoprolol (LOPRESSOR) 100 MG tablet TAKE 1 TABLET BY MOUTH TWICE DAILY TO CONTORL BLOOD PRESSURE 180 tablet 1   No facility-administered medications prior to visit.    PHYSICAL EXAM Filed Vitals:   08/12/14 1046  BP: 145/76  Pulse: 57  Weight: 161 lb (73.029 kg)   Body mass index is 31.44 kg/(m^2).  Generalized: Well developed, in no acute distress  Head: normocephalic and atraumatic. Oropharynx benign  Neck: Supple, no carotid bruits  Cardiac: Regular rate rhythm, no murmur  Musculoskeletal: No deformity   Neurological examination  Mentation: Alert oriented to time, place, history taking. Follows all commands speech and language fluent  Cranial nerve II-XII: Pupils were equal round reactive to light. Vision acuity and fields appear diminished in left upper quadrant which is old per patient. Facial sensation and strength were normal. Hearing is slightly reduced bilaterally. Voice slightly hoarse due to tracheobronchitis Uvula tongue midline. head turning and shoulder shrug and were normal and symmetric.Tongue protrusion into cheek strength was normal.  Motor: normal bulk and tone,  full strength in the BUE, BLE, fine finger movements normal, no pronator drift. No focal weakness  Sensory: normal and symmetric to light touch, pinprick, and vibration  Coordination: finger-nose-finger, heel-to-shin bilaterally, no dysmetria  Reflexes: Deep tendon reflexes in the upper and lower extremities are present and symmetric.  Gait and Station: Rising up from seated position without assistance, normal stance, without trunk ataxia, moderate stride, good arm swing, smooth turning, unable to perform tiptoe, and heel walking without difficulty.   ASSESSMENT: Ms. JULYA ALIOTO is a 81 y.o. female who presented with blurred vision, left facial numbness, and left facial droop on 02/28/2013. Imaging confirmed a right occipital infarct. 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 Patient with residual left upper field cut. She remained stable from neurovascular standpoint. Recent episode of tracheobronchitis  PLAN:  I had a long d/w patient about his recent stroke, risk for recurrent stroke/TIAs, personally independently reviewed imaging studies and stroke evaluation results and answered questions.Continue Plavix  for secondary stroke prevention and maintain strict control of hypertension with blood pressure goal below 130/90, diabetes with hemoglobin A1c goal below 6.5% and lipids with LDL cholesterol goal below 100 mg/dL. I also advised the patient to eat a healthy diet with plenty of whole grains, cereals,  fruits and vegetables, exercise regularly and maintain ideal body weight . Check screening carotid ultrasound study. Continue follow-up with family physician for tracheobronchitis .Followup in the future with me in  1 year or call earlier if necessary.   Delia HeadyPramod Randen Kauth, MD  08/12/2014, 11:47 AM [facility] [address], [address] [place], [place] [address] [phone]  Note: This document was prepared with digital dictation and possible smart  phrase technology. Any transcriptional errors that result from this process are unintentional.

## 2014-08-13 ENCOUNTER — Encounter: Payer: Self-pay | Admitting: Internal Medicine

## 2014-08-19 ENCOUNTER — Telehealth: Payer: Self-pay | Admitting: *Deleted

## 2014-08-19 NOTE — Telephone Encounter (Signed)
Patient called and stated she has completed the antibiotic but the hoarseness is still there. Slightly better but still aggravating. No pain when she swallows. Appointment scheduled

## 2014-08-20 ENCOUNTER — Ambulatory Visit (INDEPENDENT_AMBULATORY_CARE_PROVIDER_SITE_OTHER): Payer: Medicare Other | Admitting: Internal Medicine

## 2014-08-20 ENCOUNTER — Encounter: Payer: Self-pay | Admitting: Internal Medicine

## 2014-08-20 VITALS — BP 140/78 | HR 85 | Temp 97.8°F | Ht 60.0 in | Wt 159.2 lb

## 2014-08-20 DIAGNOSIS — I10 Essential (primary) hypertension: Secondary | ICD-10-CM | POA: Diagnosis not present

## 2014-08-20 DIAGNOSIS — R600 Localized edema: Secondary | ICD-10-CM

## 2014-08-20 DIAGNOSIS — I6529 Occlusion and stenosis of unspecified carotid artery: Secondary | ICD-10-CM | POA: Diagnosis not present

## 2014-08-20 DIAGNOSIS — J40 Bronchitis, not specified as acute or chronic: Secondary | ICD-10-CM

## 2014-08-20 NOTE — Progress Notes (Signed)
Patient ID: Kristin Wilkerson, female   DOB: [birth year], 81 y.o.   MRN: [id number]    Facility  [facility]    Place of Service:   OFFICE    No Known Allergies  Chief Complaint  Patient presents with  . Medical Management of Chronic Issues    Complains of Hoarseness is no better and has a runny nose    HPI:   Essential hypertension: Stable  Bilateral edema of lower extremity: Back to normal on Lasix. She was so she can stop this medicine.  Tracheobronchitis: Says she remains hoarse. Cough is improved. Still has occasional wheeze and center chest.    Medications: Patient's Medications  New Prescriptions   No medications on file  Previous Medications   ATORVASTATIN (LIPITOR) 20 MG TABLET    TAKE 1 TABLET BY MOUTH EVERY EVENING TO CONTROL CHOLESTEROL   CALCIUM CARBONATE-VITAMIN D (CALCIUM 600+D) 600-400 MG-UNIT PER TABLET    Take one tablet by mouth daily for calcium supplement   CETIRIZINE (ZYRTEC) 10 MG TABLET    Take 1 tablet (10 mg total) by mouth daily.   CHOLECALCIFEROL (VITAMIN D) 1000 UNITS TABLET    Take one tablet once daily for vitamin d   CLOPIDOGREL (PLAVIX) 75 MG TABLET    TAKE 1 TABLET BY MOUTH EVERY MORNING FOR ANTICOAGULATION   DULOXETINE (CYMBALTA) 30 MG CAPSULE    One daily to help nerves   FLUTICASONE-SALMETEROL (ADVAIR DISKUS) 250-50 MCG/DOSE AEPB    Inhale one puff in lungs every 12 hours   FUROSEMIDE (LASIX) 20 MG TABLET    TAKE 1 TABLET BY MOUTH EVERY DAY   LATANOPROST (XALATAN) 0.005 % OPHTHALMIC SOLUTION    INSTILL 1 DROP TO BOTH EYES AT BEDTIME TO TREAT GLAUCOMA   LORAZEPAM (ATIVAN) 1 MG TABLET    1 by mouth daily as needed for anxiety   LOSARTAN (COZAAR) 100 MG TABLET    TAKE 1 TABLET BY MOUTH ONCE DAILY FOR BLOOD PRESSURE   METOPROLOL (LOPRESSOR) 100 MG TABLET    TAKE 1 TABLET BY MOUTH TWICE DAILY TO CONTORL BLOOD PRESSURE  Modified Medications   No medications on file  Discontinued Medications   CLINDAMYCIN (CLEOCIN) 300 MG CAPSULE    One 3 times daily for  infection     Review of Systems  Constitutional: Negative.   HENT: Positive for hearing loss.   Eyes:       Right hemianopsia  Respiratory: Positive for cough and wheezing. Negative for shortness of breath.   Cardiovascular: Positive for leg swelling. Negative for chest pain and palpitations.  Gastrointestinal: Negative.   Endocrine: Negative.   Genitourinary: Positive for frequency. Negative for urgency.       Nocturia  Musculoskeletal: Positive for myalgias (legs hurt in calves) and arthralgias.  Skin: Negative for rash.  Allergic/Immunologic: Negative.   Neurological: Negative for dizziness, tremors, speech difficulty, light-headedness and headaches.       Tingling sensations in lower extremities. TIA/CVA 03/07/2013. Has had resolution of the decreased vision in the right eye and the left facial droop.  Hematological: Negative.   Psychiatric/Behavioral:       Can't get her husband's death out of her mind-reports she misses her husband    Filed Vitals:   08/20/14 1529  BP: 140/78  Pulse: 85  Temp: 97.8 F (36.6 C)  TempSrc: Oral  Height: 5' (1.524 m)  Weight: 159 lb 3.2 oz (72.213 kg)   Body mass index is 31.09 kg/(m^2).  Physical Exam  Constitutional: She  is oriented to person, place, and time. She appears well-developed and well-nourished. No distress.  HENT:  Head: Normocephalic.  Right Ear: External ear normal.  Left Ear: External ear normal.  Nose: Nose normal.  Bilateral loss of hearing  Eyes: Conjunctivae and EOM are normal. Pupils are equal, round, and reactive to light.  Mild right hemianopsia laterally  Neck: Normal range of motion. Neck supple. No JVD present. No tracheal deviation present. No thyromegaly present.  Cardiovascular: Exam reveals no distant heart sounds and no friction rub.   Murmur heard.  Systolic murmur is present with a grade of 2/6  Holosystolic  Pulmonary/Chest: No respiratory distress. She has no wheezes. She has no rales. She  exhibits no tenderness.  Abdominal: Soft. Bowel sounds are normal. She exhibits no distension and no mass. There is no tenderness.  Musculoskeletal: Normal range of motion. She exhibits no edema or tenderness.  Lymphadenopathy:    She has no cervical adenopathy.  Neurological: She is alert and oriented to person, place, and time. She has normal reflexes.  Skin: Skin is warm and dry. No rash noted. No erythema. No pallor.  Psychiatric: She has a normal mood and affect. Her behavior is normal. Judgment and thought content normal.     Labs reviewed: No visits with results within 3 Month(s) from this visit. Latest known visit with results is:  Admission on 04/25/2014, Discharged on 04/25/2014  Component Date Value Ref Range Status  . WBC 04/25/2014 5.1  4.0 - 10.5 K/uL Final  . RBC 04/25/2014 4.26  3.87 - 5.11 MIL/uL Final  . Hemoglobin 04/25/2014 13.1  12.0 - 15.0 g/dL Final  . HCT 04/25/2014 40.2  36.0 - 46.0 % Final  . MCV 04/25/2014 94.4  78.0 - 100.0 fL Final  . MCH 04/25/2014 30.8  26.0 - 34.0 pg Final  . MCHC 04/25/2014 32.6  30.0 - 36.0 g/dL Final  . RDW 04/25/2014 13.4  11.5 - 15.5 % Final  . Platelets 04/25/2014 203  150 - 400 K/uL Final  . Neutrophils Relative % 04/25/2014 55  43 - 77 % Final  . Neutro Abs 04/25/2014 2.8  1.7 - 7.7 K/uL Final  . Lymphocytes Relative 04/25/2014 31  12 - 46 % Final  . Lymphs Abs 04/25/2014 1.6  0.7 - 4.0 K/uL Final  . Monocytes Relative 04/25/2014 12  3 - 12 % Final  . Monocytes Absolute 04/25/2014 0.6  0.1 - 1.0 K/uL Final  . Eosinophils Relative 04/25/2014 2  0 - 5 % Final  . Eosinophils Absolute 04/25/2014 0.1  0.0 - 0.7 K/uL Final  . Basophils Relative 04/25/2014 0  0 - 1 % Final  . Basophils Absolute 04/25/2014 0.0  0.0 - 0.1 K/uL Final  . Sodium 04/25/2014 140  135 - 145 mmol/L Final   Please note change in reference range.  . Potassium 04/25/2014 4.2  3.5 - 5.1 mmol/L Final   Please note change in reference range.  . Chloride  04/25/2014 103  96 - 112 mEq/L Final  . CO2 04/25/2014 27  19 - 32 mmol/L Final  . Glucose, Bld 04/25/2014 96  70 - 99 mg/dL Final  . BUN 04/25/2014 14  6 - 23 mg/dL Final  . Creatinine, Ser 04/25/2014 0.88  0.50 - 1.10 mg/dL Final  . Calcium 04/25/2014 9.3  8.4 - 10.5 mg/dL Final  . GFR calc non Af Amer 04/25/2014 58* >90 mL/min Final  . GFR calc Af Amer 04/25/2014 67* >90 mL/min Final  Comment: (NOTE) The eGFR has been calculated using the CKD EPI equation. This calculation has not been validated in all clinical situations. eGFR's persistently <90 mL/min signify possible Chronic Kidney Disease.   . Anion gap 04/25/2014 10  5 - 15 Final     Assessment/Plan  1. Essential hypertension Controlled  2. Bilateral edema of lower extremity Reduce furosemide to every other day. Continue dose reductions if edema does not resume.  3. Tracheobronchitis I think she is on the mend. I did not renew Advair Diskus. Posterior pharynx is not red. There is no purulence. There is no lymphadenopathy in the neck.

## 2014-09-04 DIAGNOSIS — S8001XA Contusion of right knee, initial encounter: Secondary | ICD-10-CM | POA: Diagnosis not present

## 2014-09-04 DIAGNOSIS — S8002XA Contusion of left knee, initial encounter: Secondary | ICD-10-CM | POA: Diagnosis not present

## 2014-09-04 DIAGNOSIS — M5136 Other intervertebral disc degeneration, lumbar region: Secondary | ICD-10-CM | POA: Diagnosis not present

## 2014-09-04 DIAGNOSIS — L03116 Cellulitis of left lower limb: Secondary | ICD-10-CM | POA: Diagnosis not present

## 2014-09-10 ENCOUNTER — Ambulatory Visit (INDEPENDENT_AMBULATORY_CARE_PROVIDER_SITE_OTHER): Payer: Medicare Other

## 2014-09-10 DIAGNOSIS — I6529 Occlusion and stenosis of unspecified carotid artery: Secondary | ICD-10-CM

## 2014-09-11 DIAGNOSIS — S8002XD Contusion of left knee, subsequent encounter: Secondary | ICD-10-CM | POA: Diagnosis not present

## 2014-09-24 ENCOUNTER — Encounter: Payer: Self-pay | Admitting: Internal Medicine

## 2014-09-24 ENCOUNTER — Other Ambulatory Visit: Payer: Self-pay | Admitting: Internal Medicine

## 2014-09-24 ENCOUNTER — Ambulatory Visit (INDEPENDENT_AMBULATORY_CARE_PROVIDER_SITE_OTHER): Payer: Medicare Other | Admitting: Internal Medicine

## 2014-09-24 ENCOUNTER — Telehealth: Payer: Self-pay | Admitting: *Deleted

## 2014-09-24 VITALS — BP 134/80 | HR 65 | Temp 97.8°F | Ht 60.0 in | Wt 162.0 lb

## 2014-09-24 DIAGNOSIS — R05 Cough: Secondary | ICD-10-CM

## 2014-09-24 DIAGNOSIS — B373 Candidiasis of vulva and vagina: Secondary | ICD-10-CM | POA: Diagnosis not present

## 2014-09-24 DIAGNOSIS — I6529 Occlusion and stenosis of unspecified carotid artery: Secondary | ICD-10-CM

## 2014-09-24 DIAGNOSIS — B3731 Acute candidiasis of vulva and vagina: Secondary | ICD-10-CM

## 2014-09-24 DIAGNOSIS — R059 Cough, unspecified: Secondary | ICD-10-CM

## 2014-09-24 DIAGNOSIS — I10 Essential (primary) hypertension: Secondary | ICD-10-CM | POA: Diagnosis not present

## 2014-09-24 MED ORDER — CLOTRIMAZOLE 1 % EX CREA: TOPICAL_CREAM | CUTANEOUS | Status: DC

## 2014-09-24 MED ORDER — FUROSEMIDE 20 MG PO TABS: ORAL_TABLET | ORAL | Status: DC

## 2014-09-24 MED ORDER — NYSTATIN-TRIAMCINOLONE 100000-0.1 UNIT/GM-% EX CREA: TOPICAL_CREAM | CUTANEOUS | Status: DC

## 2014-09-24 NOTE — Progress Notes (Signed)
Patient ID: Kristin Wilkerson, female   DOB: 11/17/1925, 81 y.o.   MRN: [id number]    Facility  [facility]    Place of Service:   OFFICE    No Known Allergies  Chief Complaint  Patient presents with  . Acute Visit    Cough since last ov, patient is slightly better   . Medication Management    Discuss the need for Zyrtec and Lasix  . Rash    Rash and burning in the folds of vaginal area    HPI:  Monilial vulvovaginitis: Itching and burning red rash to folds and perineum for 8 days now. Has scratched until it bleeds from time to time. Has tried putting creams on it. States "I know I have this from it staying wet down there."  Cough: Continues with nonproductive cough, feels like something caught in throat, worse in am and during day than at night. No fever, chills, headaches or runny nose.   Essential hypertension: Controlled.  Edema: Improved. Trace edema present. Has not reduced dose of Lasix to every other day as instructed at last visit. Would like to reduce dose.  Medications: Patient's Medications  New Prescriptions   NYSTATIN-TRIAMCINOLONE (MYCOLOG II) CREAM    Apply to rash twice daily until rash is gone  Previous Medications   ATORVASTATIN (LIPITOR) 20 MG TABLET    TAKE 1 TABLET BY MOUTH EVERY EVENING TO CONTROL CHOLESTEROL   CALCIUM CARBONATE-VITAMIN D (CALCIUM 600+D) 600-400 MG-UNIT PER TABLET    Take one tablet by mouth daily for calcium supplement   CETIRIZINE (ZYRTEC) 10 MG TABLET    Take 1 tablet (10 mg total) by mouth daily.   CHOLECALCIFEROL (VITAMIN D) 1000 UNITS TABLET    Take one tablet once daily for vitamin d   CLOPIDOGREL (PLAVIX) 75 MG TABLET    TAKE 1 TABLET BY MOUTH EVERY MORNING FOR ANTICOAGULATION   DULOXETINE (CYMBALTA) 30 MG CAPSULE    One daily to help nerves   FLUTICASONE-SALMETEROL (ADVAIR DISKUS) 250-50 MCG/DOSE AEPB    Inhale one puff in lungs every 12 hours   LATANOPROST (XALATAN) 0.005 % OPHTHALMIC SOLUTION    INSTILL 1 DROP TO BOTH EYES AT BEDTIME TO  TREAT GLAUCOMA   LORAZEPAM (ATIVAN) 1 MG TABLET    1 by mouth daily as needed for anxiety   LOSARTAN (COZAAR) 100 MG TABLET    TAKE 1 TABLET BY MOUTH ONCE DAILY FOR BLOOD PRESSURE   METOPROLOL (LOPRESSOR) 100 MG TABLET    TAKE 1 TABLET BY MOUTH TWICE DAILY TO CONTORL BLOOD PRESSURE  Modified Medications   Modified Medication Previous Medication   FUROSEMIDE (LASIX) 20 MG TABLET furosemide (LASIX) 20 MG tablet      Take one every other day for diuretic    TAKE 1 TABLET BY MOUTH EVERY DAY  Discontinued Medications   No medications on file     Review of Systems  Constitutional: Negative.   HENT: Positive for hearing loss.   Eyes:       Right hemianopsia  Respiratory: Positive for cough. Negative for shortness of breath.   Cardiovascular: Positive for leg swelling (improved). Negative for chest pain and palpitations.  Gastrointestinal: Negative.   Endocrine: Negative.   Genitourinary: Positive for frequency. Negative for urgency.       Nocturia  Musculoskeletal: Positive for myalgias (legs hurt in calves) and arthralgias.  Skin: Negative for rash.  Allergic/Immunologic: Negative.   Neurological: Negative for dizziness, tremors, speech difficulty, light-headedness and headaches.  Tingling sensations in lower extremities. TIA/CVA 03/07/2013. Has had resolution of the decreased vision in the right eye and the left facial droop.  Hematological: Negative.   Psychiatric/Behavioral:       Can't get her husband's death out of her mind-reports she misses her husband    Filed Vitals:   09/24/14 1217  BP: 134/80  Pulse: 65  Temp: 97.8 F (36.6 C)  TempSrc: Oral  Height: 5' (1.524 m)  Weight: 162 lb (73.483 kg)  SpO2: 98%   Body mass index is 31.64 kg/(m^2).  Physical Exam  Constitutional: She is oriented to person, place, and time. She appears well-developed and well-nourished. No distress.  HENT:  Head: Normocephalic.  Right Ear: External ear normal.  Left Ear: External  ear normal.  Nose: Nose normal.  Bilateral loss of hearing  Eyes: Conjunctivae and EOM are normal. Pupils are equal, round, and reactive to light.  Mild right hemianopsia laterally  Neck: Normal range of motion. Neck supple. No JVD present. No tracheal deviation present. No thyromegaly present.  Cardiovascular: Exam reveals no distant heart sounds and no friction rub.   Murmur heard.  Systolic murmur is present with a grade of 2/6  Holosystolic  Pulmonary/Chest: No respiratory distress. She has no wheezes. She has no rales. She exhibits no tenderness.  Abdominal: Soft. Bowel sounds are normal. She exhibits no distension and no mass. There is no tenderness.  Musculoskeletal: Normal range of motion. She exhibits edema (trace ankle puffiness). She exhibits no tenderness.  Lymphadenopathy:    She has no cervical adenopathy.  Neurological: She is alert and oriented to person, place, and time. She has normal reflexes.  Skin: Skin is warm and dry. No rash noted. No erythema. No pallor.  Psychiatric: She has a normal mood and affect. Her behavior is normal. Judgment and thought content normal.     Labs reviewed: No visits with results within 3 Month(s) from this visit. Latest known visit with results is:  Admission on 04/25/2014, Discharged on 04/25/2014  Component Date Value Ref Range Status  . WBC 04/25/2014 5.1  4.0 - 10.5 K/uL Final  . RBC 04/25/2014 4.26  3.87 - 5.11 MIL/uL Final  . Hemoglobin 04/25/2014 13.1  12.0 - 15.0 g/dL Final  . HCT 04/25/2014 40.2  36.0 - 46.0 % Final  . MCV 04/25/2014 94.4  78.0 - 100.0 fL Final  . MCH 04/25/2014 30.8  26.0 - 34.0 pg Final  . MCHC 04/25/2014 32.6  30.0 - 36.0 g/dL Final  . RDW 04/25/2014 13.4  11.5 - 15.5 % Final  . Platelets 04/25/2014 203  150 - 400 K/uL Final  . Neutrophils Relative % 04/25/2014 55  43 - 77 % Final  . Neutro Abs 04/25/2014 2.8  1.7 - 7.7 K/uL Final  . Lymphocytes Relative 04/25/2014 31  12 - 46 % Final  . Lymphs Abs  04/25/2014 1.6  0.7 - 4.0 K/uL Final  . Monocytes Relative 04/25/2014 12  3 - 12 % Final  . Monocytes Absolute 04/25/2014 0.6  0.1 - 1.0 K/uL Final  . Eosinophils Relative 04/25/2014 2  0 - 5 % Final  . Eosinophils Absolute 04/25/2014 0.1  0.0 - 0.7 K/uL Final  . Basophils Relative 04/25/2014 0  0 - 1 % Final  . Basophils Absolute 04/25/2014 0.0  0.0 - 0.1 K/uL Final  . Sodium 04/25/2014 140  135 - 145 mmol/L Final   Please note change in reference range.  . Potassium 04/25/2014 4.2  3.5 -  5.1 mmol/L Final   Please note change in reference range.  . Chloride 04/25/2014 103  96 - 112 mEq/L Final  . CO2 04/25/2014 27  19 - 32 mmol/L Final  . Glucose, Bld 04/25/2014 96  70 - 99 mg/dL Final  . BUN 04/25/2014 14  6 - 23 mg/dL Final  . Creatinine, Ser 04/25/2014 0.88  0.50 - 1.10 mg/dL Final  . Calcium 04/25/2014 9.3  8.4 - 10.5 mg/dL Final  . GFR calc non Af Amer 04/25/2014 58* >90 mL/min Final  . GFR calc Af Amer 04/25/2014 67* >90 mL/min Final   Comment: (NOTE) The eGFR has been calculated using the CKD EPI equation. This calculation has not been validated in all clinical situations. eGFR's persistently <90 mL/min signify possible Chronic Kidney Disease.   . Anion gap 04/25/2014 10  5 - 15 Final     Assessment/Plan   1. Monilial vulvovaginitis Keep area clean and dry - nystatin-triamcinolone (MYCOLOG II) cream; Apply to rash twice daily until rash is gone  Dispense: 30 g; Refill: 0  2. Cough May use Robitussin DM or Delsym OTC  3. Essential hypertension Stable  4. Edema Reduce dose to Lasix 20 mg po every other day

## 2014-09-24 NOTE — Progress Notes (Signed)
Patient ID: Kristin Wilkerson, female   DOB: [birth year], 81 y.o.   MRN: [id number]    Facility  [facility]    Place of Service:   OFFICE    No Known Allergies  Chief Complaint  Patient presents with  . Acute Visit    Cough since last ov, patient is slightly better   . Medication Management    Discuss the need for Zyrtec and Lasix  . Rash    Rash and burning in the folds of vaginal area    HPI:   Monilial vulvovaginitis: rash in both groins. Satellite lesions in the right groin. No recent antibiotics. Not diabetic.  Cough: worse in the day. Does not seem worse at meals. Non-productive. No fever.  Essential hypertension; controlled    Medications: Patient's Medications  New Prescriptions   No medications on file  Previous Medications   ATORVASTATIN (LIPITOR) 20 MG TABLET    TAKE 1 TABLET BY MOUTH EVERY EVENING TO CONTROL CHOLESTEROL   CALCIUM CARBONATE-VITAMIN D (CALCIUM 600+D) 600-400 MG-UNIT PER TABLET    Take one tablet by mouth daily for calcium supplement   CETIRIZINE (ZYRTEC) 10 MG TABLET    Take 1 tablet (10 mg total) by mouth daily.   CHOLECALCIFEROL (VITAMIN D) 1000 UNITS TABLET    Take one tablet once daily for vitamin d   CLOPIDOGREL (PLAVIX) 75 MG TABLET    TAKE 1 TABLET BY MOUTH EVERY MORNING FOR ANTICOAGULATION   DULOXETINE (CYMBALTA) 30 MG CAPSULE    One daily to help nerves   FLUTICASONE-SALMETEROL (ADVAIR DISKUS) 250-50 MCG/DOSE AEPB    Inhale one puff in lungs every 12 hours   FUROSEMIDE (LASIX) 20 MG TABLET    TAKE 1 TABLET BY MOUTH EVERY DAY   LATANOPROST (XALATAN) 0.005 % OPHTHALMIC SOLUTION    INSTILL 1 DROP TO BOTH EYES AT BEDTIME TO TREAT GLAUCOMA   LORAZEPAM (ATIVAN) 1 MG TABLET    1 by mouth daily as needed for anxiety   LOSARTAN (COZAAR) 100 MG TABLET    TAKE 1 TABLET BY MOUTH ONCE DAILY FOR BLOOD PRESSURE   METOPROLOL (LOPRESSOR) 100 MG TABLET    TAKE 1 TABLET BY MOUTH TWICE DAILY TO CONTORL BLOOD PRESSURE  Modified Medications   No medications on file    Discontinued Medications   No medications on file     Review of Systems  Constitutional: Negative.   HENT: Positive for hearing loss.   Eyes:       Right hemianopsia  Respiratory: Positive for cough and wheezing. Negative for shortness of breath.   Cardiovascular: Positive for leg swelling. Negative for chest pain and palpitations.  Gastrointestinal: Negative.   Endocrine: Negative.   Genitourinary: Positive for frequency. Negative for urgency.       Nocturia  Musculoskeletal: Positive for myalgias (legs hurt in calves) and arthralgias.  Skin: Negative for rash.  Allergic/Immunologic: Negative.   Neurological: Negative for dizziness, tremors, speech difficulty, light-headedness and headaches.       Tingling sensations in lower extremities. TIA/CVA 03/07/2013. Has had resolution of the decreased vision in the right eye and the left facial droop.  Hematological: Negative.   Psychiatric/Behavioral:       Can't get her husband's death out of her mind-reports she misses her husband    Filed Vitals:   09/24/14 1217  BP: 134/80  Pulse: 65  Temp: 97.8 F (36.6 C)  TempSrc: Oral  Height: 5' (1.524 m)  Weight: 162 lb (73.483 kg)  SpO2: 98%  Body mass index is 31.64 kg/(m^2).  Physical Exam  Constitutional: She is oriented to person, place, and time. She appears well-developed and well-nourished. No distress.  HENT:  Head: Normocephalic.  Right Ear: External ear normal.  Left Ear: External ear normal.  Nose: Nose normal.  Bilateral loss of hearing  Eyes: Conjunctivae and EOM are normal. Pupils are equal, round, and reactive to light.  Mild right hemianopsia laterally  Neck: Normal range of motion. Neck supple. No JVD present. No tracheal deviation present. No thyromegaly present.  Cardiovascular: Exam reveals no distant heart sounds and no friction rub.   Murmur heard.  Systolic murmur is present with a grade of 2/6  Holosystolic  Pulmonary/Chest: No respiratory  distress. She has no wheezes. She has no rales. She exhibits no tenderness.  Abdominal: Soft. Bowel sounds are normal. She exhibits no distension and no mass. There is no tenderness.  Musculoskeletal: Normal range of motion. She exhibits no edema or tenderness.  Lymphadenopathy:    She has no cervical adenopathy.  Neurological: She is alert and oriented to person, place, and time. She has normal reflexes.  Skin: Skin is warm and dry. No rash noted. No erythema. No pallor.  Psychiatric: She has a normal mood and affect. Her behavior is normal. Judgment and thought content normal.     Labs reviewed: No visits with results within 3 Month(s) from this visit. Latest known visit with results is:  Admission on 04/25/2014, Discharged on 04/25/2014  Component Date Value Ref Range Status  . WBC 04/25/2014 5.1  4.0 - 10.5 K/uL Final  . RBC 04/25/2014 4.26  3.87 - 5.11 MIL/uL Final  . Hemoglobin 04/25/2014 13.1  12.0 - 15.0 g/dL Final  . HCT 04/25/2014 40.2  36.0 - 46.0 % Final  . MCV 04/25/2014 94.4  78.0 - 100.0 fL Final  . MCH 04/25/2014 30.8  26.0 - 34.0 pg Final  . MCHC 04/25/2014 32.6  30.0 - 36.0 g/dL Final  . RDW 04/25/2014 13.4  11.5 - 15.5 % Final  . Platelets 04/25/2014 203  150 - 400 K/uL Final  . Neutrophils Relative % 04/25/2014 55  43 - 77 % Final  . Neutro Abs 04/25/2014 2.8  1.7 - 7.7 K/uL Final  . Lymphocytes Relative 04/25/2014 31  12 - 46 % Final  . Lymphs Abs 04/25/2014 1.6  0.7 - 4.0 K/uL Final  . Monocytes Relative 04/25/2014 12  3 - 12 % Final  . Monocytes Absolute 04/25/2014 0.6  0.1 - 1.0 K/uL Final  . Eosinophils Relative 04/25/2014 2  0 - 5 % Final  . Eosinophils Absolute 04/25/2014 0.1  0.0 - 0.7 K/uL Final  . Basophils Relative 04/25/2014 0  0 - 1 % Final  . Basophils Absolute 04/25/2014 0.0  0.0 - 0.1 K/uL Final  . Sodium 04/25/2014 140  135 - 145 mmol/L Final   Please note change in reference range.  . Potassium 04/25/2014 4.2  3.5 - 5.1 mmol/L Final    Please note change in reference range.  . Chloride 04/25/2014 103  96 - 112 mEq/L Final  . CO2 04/25/2014 27  19 - 32 mmol/L Final  . Glucose, Bld 04/25/2014 96  70 - 99 mg/dL Final  . BUN 04/25/2014 14  6 - 23 mg/dL Final  . Creatinine, Ser 04/25/2014 0.88  0.50 - 1.10 mg/dL Final  . Calcium 04/25/2014 9.3  8.4 - 10.5 mg/dL Final  . GFR calc non Af Amer 04/25/2014 58* >90 mL/min Final  .  GFR calc Af Amer 04/25/2014 67* >90 mL/min Final   Comment: (NOTE) The eGFR has been calculated using the CKD EPI equation. This calculation has not been validated in all clinical situations. eGFR's persistently <90 mL/min signify possible Chronic Kidney Disease.   . Anion gap 04/25/2014 10  5 - 15 Final     Assessment/Plan  1. Monilial vulvovaginitis - nystatin-triamcinolone (MYCOLOG II) cream; Apply to rash twice daily until rash is gone  Dispense: 30 g; Refill: 0  2. Cough Try Robitussin-DM or Delsym  3. Essential hypertension Controlled

## 2014-09-24 NOTE — Telephone Encounter (Signed)
Spoke to patient regarding cream for rash , to inform he that a new medication has been order to replace the one the insurance company would not cover.

## 2014-09-24 NOTE — Patient Instructions (Signed)
For cough, use Robitussin DM or Delsym.

## 2014-09-26 DIAGNOSIS — M1711 Unilateral primary osteoarthritis, right knee: Secondary | ICD-10-CM | POA: Diagnosis not present

## 2014-11-04 ENCOUNTER — Ambulatory Visit (INDEPENDENT_AMBULATORY_CARE_PROVIDER_SITE_OTHER): Payer: Medicare Other | Admitting: Internal Medicine

## 2014-11-04 ENCOUNTER — Encounter: Payer: Self-pay | Admitting: Internal Medicine

## 2014-11-04 VITALS — BP 126/82 | HR 59 | Temp 97.4°F | Resp 20 | Ht 60.0 in | Wt 167.0 lb

## 2014-11-04 DIAGNOSIS — F331 Major depressive disorder, recurrent, moderate: Secondary | ICD-10-CM | POA: Diagnosis not present

## 2014-11-04 DIAGNOSIS — I6529 Occlusion and stenosis of unspecified carotid artery: Secondary | ICD-10-CM

## 2014-11-04 DIAGNOSIS — R059 Cough, unspecified: Secondary | ICD-10-CM

## 2014-11-04 DIAGNOSIS — E785 Hyperlipidemia, unspecified: Secondary | ICD-10-CM

## 2014-11-04 DIAGNOSIS — J40 Bronchitis, not specified as acute or chronic: Secondary | ICD-10-CM

## 2014-11-04 DIAGNOSIS — I10 Essential (primary) hypertension: Secondary | ICD-10-CM | POA: Diagnosis not present

## 2014-11-04 DIAGNOSIS — M25561 Pain in right knee: Secondary | ICD-10-CM

## 2014-11-04 DIAGNOSIS — B373 Candidiasis of vulva and vagina: Secondary | ICD-10-CM | POA: Diagnosis not present

## 2014-11-04 DIAGNOSIS — M545 Low back pain, unspecified: Secondary | ICD-10-CM

## 2014-11-04 DIAGNOSIS — R05 Cough: Secondary | ICD-10-CM | POA: Diagnosis not present

## 2014-11-04 DIAGNOSIS — G4700 Insomnia, unspecified: Secondary | ICD-10-CM | POA: Diagnosis not present

## 2014-11-04 DIAGNOSIS — B3731 Acute candidiasis of vulva and vagina: Secondary | ICD-10-CM

## 2014-11-04 MED ORDER — FLUTICASONE-SALMETEROL 250-50 MCG/DOSE IN AEPB
INHALATION_SPRAY | RESPIRATORY_TRACT | Status: DC
Start: 2014-11-04 — End: 2014-12-24

## 2014-11-04 NOTE — Progress Notes (Signed)
Patient ID: Kristin Wilkerson, female   DOB: Apr 18, 1926, 81 y.o.   MRN: [id number]    Facility  [facility]    Place of Service:   OFFICE    No Known Allergies  Chief Complaint  Patient presents with  . Medical Management of Chronic Issues    cough,and wheezing    HPI:  Essential hypertension - controlled  Depression, major, recurrent, moderate - less evidence of grieving and depression. Patient remains on Cymbalta.  Insomnia - sleeping much better  Midline low back pain without sciatica - improved  Cough - continues to be a problem. She felt it was getting better on Fluticasone-Salmeterol (ADVAIR DISKUS) 250-50 MCG/DOSE. She was given a sample of this drug previously and she stopped it when it ran out. She is not using it now.  Monilial vulvovaginitis - resolved  Tracheobronchitis - chronic condition related to her cough  Right knee pain - continues to have significant discomfort. She is thinking about surgery.  HLD (hyperlipidemia) - image routine lab follow-up next visit    Medications: Patient's Medications  New Prescriptions   No medications on file  Previous Medications   ATORVASTATIN (LIPITOR) 20 MG TABLET    TAKE 1 TABLET BY MOUTH EVERY EVENING TO CONTROL CHOLESTEROL   CALCIUM CARBONATE-VITAMIN D (CALCIUM 600+D) 600-400 MG-UNIT PER TABLET    Take one tablet by mouth daily for calcium supplement   CETIRIZINE (ZYRTEC) 10 MG TABLET    Take 1 tablet (10 mg total) by mouth daily.   CHOLECALCIFEROL (VITAMIN D) 1000 UNITS TABLET    Take one tablet once daily for vitamin d   CLOPIDOGREL (PLAVIX) 75 MG TABLET    TAKE 1 TABLET BY MOUTH EVERY MORNING FOR ANTICOAGULATION   CLOTRIMAZOLE (LOTRIMIN) 1 % CREAM    Apply twice daily to rash until resolved   DULOXETINE (CYMBALTA) 30 MG CAPSULE    One daily to help nerves   FLUTICASONE-SALMETEROL (ADVAIR DISKUS) 250-50 MCG/DOSE AEPB    Inhale one puff in lungs every 12 hours   FUROSEMIDE (LASIX) 20 MG TABLET    Take one every other day for  diuretic   LATANOPROST (XALATAN) 0.005 % OPHTHALMIC SOLUTION    INSTILL 1 DROP TO BOTH EYES AT BEDTIME TO TREAT GLAUCOMA   LORAZEPAM (ATIVAN) 1 MG TABLET    1 by mouth daily as needed for anxiety   LOSARTAN (COZAAR) 100 MG TABLET    TAKE 1 TABLET BY MOUTH ONCE DAILY FOR BLOOD PRESSURE   METOPROLOL (LOPRESSOR) 100 MG TABLET    TAKE 1 TABLET BY MOUTH TWICE DAILY TO CONTORL BLOOD PRESSURE  Modified Medications   No medications on file  Discontinued Medications   No medications on file     Review of Systems  Constitutional: Negative.   HENT: Positive for hearing loss.   Eyes:       Right hemianopsia  Respiratory: Positive for cough and wheezing. Negative for shortness of breath.   Cardiovascular: Positive for leg swelling. Negative for chest pain and palpitations.  Gastrointestinal: Negative.   Endocrine: Negative.   Genitourinary: Positive for frequency. Negative for urgency.       Nocturia  Musculoskeletal: Positive for myalgias (legs hurt in calves) and arthralgias.  Skin: Negative for rash.  Allergic/Immunologic: Negative.   Neurological: Negative for dizziness, tremors, speech difficulty, light-headedness and headaches.       Tingling sensations in lower extremities. TIA/CVA 03/07/2013. Has had resolution of the decreased vision in the right eye and the left facial droop.  Hematological: Negative.   Psychiatric/Behavioral:       Can't get her husband's death out of her mind-reports she misses her husband    Filed Vitals:   11/04/14 1513  BP: 126/82  Pulse: 59  Temp: 97.4 F (36.3 C)  TempSrc: Oral  Resp: 20  Height: 5' (1.524 m)  Weight: 167 lb (75.751 kg)  SpO2: 96%   Body mass index is 32.62 kg/(m^2).  Physical Exam  Constitutional: She is oriented to person, place, and time. She appears well-developed and well-nourished. No distress.  HENT:  Head: Normocephalic.  Right Ear: External ear normal.  Left Ear: External ear normal.  Nose: Nose normal.  Bilateral  loss of hearing  Eyes: Conjunctivae and EOM are normal. Pupils are equal, round, and reactive to light.  Mild right hemianopsia laterally  Neck: Normal range of motion. Neck supple. No JVD present. No tracheal deviation present. No thyromegaly present.  Cardiovascular: Exam reveals no distant heart sounds and no friction rub.   Murmur heard.  Systolic murmur is present with a grade of 2/6  Holosystolic  Pulmonary/Chest: No respiratory distress. She has no wheezes. She has no rales. She exhibits no tenderness.  Abdominal: Soft. Bowel sounds are normal. She exhibits no distension and no mass. There is no tenderness.  Musculoskeletal: Normal range of motion. She exhibits no edema or tenderness.  Lymphadenopathy:    She has no cervical adenopathy.  Neurological: She is alert and oriented to person, place, and time. She has normal reflexes.  Skin: Skin is warm and dry. No rash noted. No erythema. No pallor.  Psychiatric: She has a normal mood and affect. Her behavior is normal. Judgment and thought content normal.     Labs reviewed: No visits with results within 3 Month(s) from this visit. Latest known visit with results is:  Admission on 04/25/2014, Discharged on 04/25/2014  Component Date Value Ref Range Status  . WBC 04/25/2014 5.1  4.0 - 10.5 K/uL Final  . RBC 04/25/2014 4.26  3.87 - 5.11 MIL/uL Final  . Hemoglobin 04/25/2014 13.1  12.0 - 15.0 g/dL Final  . HCT 04/25/2014 40.2  36.0 - 46.0 % Final  . MCV 04/25/2014 94.4  78.0 - 100.0 fL Final  . MCH 04/25/2014 30.8  26.0 - 34.0 pg Final  . MCHC 04/25/2014 32.6  30.0 - 36.0 g/dL Final  . RDW 04/25/2014 13.4  11.5 - 15.5 % Final  . Platelets 04/25/2014 203  150 - 400 K/uL Final  . Neutrophils Relative % 04/25/2014 55  43 - 77 % Final  . Neutro Abs 04/25/2014 2.8  1.7 - 7.7 K/uL Final  . Lymphocytes Relative 04/25/2014 31  12 - 46 % Final  . Lymphs Abs 04/25/2014 1.6  0.7 - 4.0 K/uL Final  . Monocytes Relative 04/25/2014 12  3 - 12  % Final  . Monocytes Absolute 04/25/2014 0.6  0.1 - 1.0 K/uL Final  . Eosinophils Relative 04/25/2014 2  0 - 5 % Final  . Eosinophils Absolute 04/25/2014 0.1  0.0 - 0.7 K/uL Final  . Basophils Relative 04/25/2014 0  0 - 1 % Final  . Basophils Absolute 04/25/2014 0.0  0.0 - 0.1 K/uL Final  . Sodium 04/25/2014 140  135 - 145 mmol/L Final   Please note change in reference range.  . Potassium 04/25/2014 4.2  3.5 - 5.1 mmol/L Final   Please note change in reference range.  . Chloride 04/25/2014 103  96 - 112 mEq/L Final  . CO2  04/25/2014 27  19 - 32 mmol/L Final  . Glucose, Bld 04/25/2014 96  70 - 99 mg/dL Final  . BUN 04/25/2014 14  6 - 23 mg/dL Final  . Creatinine, Ser 04/25/2014 0.88  0.50 - 1.10 mg/dL Final  . Calcium 04/25/2014 9.3  8.4 - 10.5 mg/dL Final  . GFR calc non Af Amer 04/25/2014 58* >90 mL/min Final  . GFR calc Af Amer 04/25/2014 67* >90 mL/min Final   Comment: (NOTE) The eGFR has been calculated using the CKD EPI equation. This calculation has not been validated in all clinical situations. eGFR's persistently <90 mL/min signify possible Chronic Kidney Disease.   . Anion gap 04/25/2014 10  5 - 15 Final     Assessment/Plan  1. Essential hypertension - Comprehensive metabolic panel; Future  2. Depression, major, recurrent, moderate Continue Cymbalta  3. Insomnia Tenderness Cymbalta  4. Midline low back pain without sciatica Continue current medication  5. Cough Resume use of Fluticasone-Salmeterol (ADVAIR DISKUS) 250-50 MCG/DOSE AEPB; Inhale one puff in lungs every 12 hours  Dispense: 60 each; Refill: 5  6. Monilial vulvovaginitis Resolved  7. Tracheobronchitis Resume use of Advair Diskus  8. Right knee pain Continue see orthopedist  9. HLD (hyperlipidemia) - Lipid panel; Future

## 2014-11-15 ENCOUNTER — Other Ambulatory Visit: Payer: Self-pay | Admitting: Nurse Practitioner

## 2014-11-19 ENCOUNTER — Other Ambulatory Visit: Payer: Self-pay | Admitting: Internal Medicine

## 2014-12-24 ENCOUNTER — Ambulatory Visit (INDEPENDENT_AMBULATORY_CARE_PROVIDER_SITE_OTHER): Payer: Medicare Other | Admitting: Internal Medicine

## 2014-12-24 ENCOUNTER — Encounter: Payer: Self-pay | Admitting: Internal Medicine

## 2014-12-24 VITALS — BP 128/84 | HR 62 | Temp 97.6°F | Ht 60.0 in | Wt 165.8 lb

## 2014-12-24 DIAGNOSIS — I10 Essential (primary) hypertension: Secondary | ICD-10-CM | POA: Diagnosis not present

## 2014-12-24 DIAGNOSIS — I6529 Occlusion and stenosis of unspecified carotid artery: Secondary | ICD-10-CM

## 2014-12-24 DIAGNOSIS — M270 Developmental disorders of jaws: Secondary | ICD-10-CM | POA: Diagnosis not present

## 2014-12-24 DIAGNOSIS — M25561 Pain in right knee: Secondary | ICD-10-CM | POA: Diagnosis not present

## 2014-12-24 DIAGNOSIS — J40 Bronchitis, not specified as acute or chronic: Secondary | ICD-10-CM | POA: Diagnosis not present

## 2014-12-24 MED ORDER — FLUTICASONE FUROATE-VILANTEROL 100-25 MCG/INH IN AEPB: INHALATION_SPRAY | RESPIRATORY_TRACT | Status: DC

## 2014-12-24 NOTE — Progress Notes (Signed)
Patient ID: Kristin Wilkerson, female   DOB: [birth year], 81 y.o.   MRN: [id number]    Facility  [facility]    Place of Service:   OFFICE    No Known Allergies  Chief Complaint  Patient presents with  . Acute Visit    Complains of Hoarseness, wants to discuss getting a Rx for Wheezing    HPI:  Tracheobronchitis - has been using Advair without much help in regards to discomfort in the throat. It has helped the breathing somewhat, although she admits that she still wheezes from time to time. Complains of persistent hoarseness.  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 Has been seen in the past by ENT. It is slightly irritated. She rubs her tongue against it frequently.  Essential hypertension - controlled  Right knee pain - right tibial Velcro brace for support. She says that she needed surgery. He has not been scheduled yet.    Medications: Patient's Medications  New Prescriptions   No medications on file  Previous Medications   ATORVASTATIN (LIPITOR) 20 MG TABLET    TAKE 1 TABLET BY MOUTH EVERY EVENING TO CONTROL CHOLESTEROL   CALCIUM CARBONATE-VITAMIN D (CALCIUM 600+D) 600-400 MG-UNIT PER TABLET    Take one tablet by mouth daily for calcium supplement   CETIRIZINE (ZYRTEC) 10 MG TABLET    Take 1 tablet (10 mg total) by mouth daily.   CHOLECALCIFEROL (VITAMIN D) 1000 UNITS TABLET    Take one tablet once daily for vitamin d   CLOPIDOGREL (PLAVIX) 75 MG TABLET    TAKE 1 TABLET BY MOUTH EVERY MORNING FOR ANTICOAGULATION   CLOTRIMAZOLE (LOTRIMIN) 1 % CREAM    Apply twice daily to rash until resolved   DULOXETINE (CYMBALTA) 30 MG CAPSULE    One daily to help nerves   FLUTICASONE-SALMETEROL (ADVAIR DISKUS) 250-50 MCG/DOSE AEPB    Inhale one puff in lungs every 12 hours   FUROSEMIDE (LASIX) 20 MG TABLET    Take one every other day for diuretic   LATANOPROST (XALATAN) 0.005 % OPHTHALMIC SOLUTION    INSTILL 1 DROP TO BOTH EYES AT BEDTIME TO TREAT GLAUCOMA   LORAZEPAM (ATIVAN) 1 MG  TABLET    1 by mouth daily as needed for anxiety   LOSARTAN (COZAAR) 100 MG TABLET    TAKE 1 TABLET BY MOUTH ONCE DAILY FOR BLOOD PRESSURE   METOPROLOL (LOPRESSOR) 100 MG TABLET    TAKE 1 TABLET BY MOUTH TWICE DAILY TO CONTORL BLOOD PRESSURE  Modified Medications   No medications on file  Discontinued Medications   DULOXETINE (CYMBALTA) 30 MG CAPSULE    TAKE ONE CAPSULE BY MOUTH EVERY DAY     Review of Systems  Constitutional: Negative.   HENT: Positive for hearing loss.   Eyes:       Right hemianopsia  Respiratory: Positive for cough. Negative for shortness of breath and wheezing (no audible wheezes today).        Slight rattling noise when she speaks along with some hoarseness.  Cardiovascular: Positive for leg swelling. Negative for chest pain and palpitations.  Gastrointestinal: Negative.   Endocrine: Negative.   Genitourinary: Positive for frequency. Negative for urgency.       Nocturia  Musculoskeletal: Positive for myalgias (legs hurt in calves) and arthralgias.  Skin: Negative for rash.  Allergic/Immunologic: Negative.   Neurological: Negative for dizziness, tremors, speech difficulty, light-headedness and headaches.       Tingling sensations in lower extremities. TIA/CVA 03/07/2013.  Has had resolution of the decreased vision in the right eye and the left facial droop.  Hematological: Negative.   Psychiatric/Behavioral:       Can't get her husband's death out of her mind-reports she misses her husband    Filed Vitals:   12/24/14 1429  BP: 128/84  Pulse: 62  Temp: 97.6 F (36.4 C)  TempSrc: Oral  Height: 5' (1.524 m)  Weight: 165 lb 12.8 oz (75.206 kg)  SpO2: 95%   Body mass index is 32.38 kg/(m^2).  Physical Exam  Constitutional: She is oriented to person, place, and time. She appears well-developed and well-nourished. No distress.  HENT:  Head: Normocephalic.  Right Ear: External ear normal.  Left Ear: External ear normal.  Nose: Nose normal.  Bilateral  loss of hearing Torus palatinus  Eyes: Conjunctivae and EOM are normal. Pupils are equal, round, and reactive to light.  Mild right hemianopsia laterally  Neck: Normal range of motion. Neck supple. No JVD present. No tracheal deviation present. No thyromegaly present.  Cardiovascular: Exam reveals no distant heart sounds and no friction rub.   Murmur heard.  Systolic murmur is present with a grade of 2/6  Holosystolic  Pulmonary/Chest: No respiratory distress. She has no wheezes. She has no rales. She exhibits no tenderness.  Abdominal: Soft. Bowel sounds are normal. She exhibits no distension and no mass. There is no tenderness.  Musculoskeletal: She exhibits tenderness. She exhibits no edema.  Pain in the right knee. Wearing a Velcro tubular brace.  Lymphadenopathy:    She has no cervical adenopathy.  Neurological: She is alert and oriented to person, place, and time. She has normal reflexes.  Skin: Skin is warm and dry. No rash noted. No erythema. No pallor.  Psychiatric: She has a normal mood and affect. Her behavior is normal. Judgment and thought content normal.     Labs reviewed: Lab Summary Latest Ref Rng 04/25/2014 02/21/2014  Hemoglobin 12.0 - 15.0 g/dL 13.1 (None)  Hematocrit 36.0 - 46.0 % 40.2 (None)  White count 4.0 - 10.5 K/uL 5.1 (None)  Platelet count 150 - 400 K/uL 203 (None)  Sodium 135 - 145 mmol/L 140 141  Potassium 3.5 - 5.1 mmol/L 4.2 4.1  Calcium 8.4 - 10.5 mg/dL 9.3 10.0  Phosphorus - (None) (None)  Creatinine 0.50 - 1.10 mg/dL 0.88 0.96  AST 0 - 40 IU/L (None) 20  Alk Phos 39 - 117 IU/L (None) 69  Bilirubin 0.0 - 1.2 mg/dL (None) 0.7  Glucose 70 - 99 mg/dL 96 90  Cholesterol - (None) (None)  HDL cholesterol >39 mg/dL (None) 46  Triglycerides 0 - 149 mg/dL (None) 219(H)  LDL Direct - (None) (None)  LDL Calc 0 - 99 mg/dL (None) 89  Total protein - (None) (None)  Albumin 3.5 - 4.7 g/dL (None) 4.4   Lab Results  Component Value Date   TSH 3.190  02/21/2014   Lab Results  Component Value Date   BUN 14 04/25/2014   Lab Results  Component Value Date   HGBA1C 5.7* 03/01/2013    Assessment/Plan 1. Tracheobronchitis Discontinue Advair - Fluticasone Furoate-Vilanterol (BREO ELLIPTA) 100-25 MCG/INH AEPB; Inhale into the lungs once daily  Dispense: 1 each; Refill: 0  2. Torus palatinus Advised patient of benign nature of this problem  3. Essential hypertension Controlled  4. Right knee pain Continue with orthopedist.

## 2015-01-06 DIAGNOSIS — M1711 Unilateral primary osteoarthritis, right knee: Secondary | ICD-10-CM | POA: Diagnosis not present

## 2015-01-07 ENCOUNTER — Other Ambulatory Visit: Payer: Self-pay | Admitting: Surgical

## 2015-01-13 ENCOUNTER — Encounter: Payer: Self-pay | Admitting: Physician Assistant

## 2015-01-13 ENCOUNTER — Ambulatory Visit (INDEPENDENT_AMBULATORY_CARE_PROVIDER_SITE_OTHER): Payer: Medicare Other | Admitting: Physician Assistant

## 2015-01-13 VITALS — BP 128/80 | HR 59 | Ht 62.0 in | Wt 165.0 lb

## 2015-01-13 DIAGNOSIS — I6529 Occlusion and stenosis of unspecified carotid artery: Secondary | ICD-10-CM

## 2015-01-13 DIAGNOSIS — Z01810 Encounter for preprocedural cardiovascular examination: Secondary | ICD-10-CM | POA: Diagnosis not present

## 2015-01-13 NOTE — Progress Notes (Signed)
Cardiology Office Note   Date:  01/13/2015   ID:  Kristin Wilkerson, DOB October 03, 1925, MRN [id number]  PCP:  Kimber Relic, MD  Cardiologist:  Dr William Hamburger, PA-C   Chief Complaint  Patient presents with  . Surgical Clearance    right knee replacement, scheduled for October 26, with Dr Darrelyn Hillock. patient reports shortness of breath on exertion, swelling    History of Present Illness: Kristin Wilkerson is a 81 y.o. female with a history of HTN, HL, CVA, low-risk MV 2011, nl EF by echo.  Kristin Wilkerson presents for preoperative evaluation for a knee replacement.  Her activity is very limited because of her knee problems. That said, she does not have chest pain with exertion or at rest. She does have dyspnea on exertion that has not changed recently. She gets a mild amount of lower extremity daytime edema, but does not wake up with any. She denies orthopnea or PND.  Her son has moved in with her and helps with transportation. She is is active around the house as she is able to be. She is compliant with her medications. She does not check her blood pressure at home but does not know of any extremely high or low blood pressures.  Cardiac clearance was requested for her knee replacement. It is planned for the last week in October.   Past Medical History  Diagnosis Date  . Hypertension   . Abnormality of gait   . Disturbance of skin sensation   . Urinary frequency   . Tachycardia, unspecified   . Internal hemorrhoids without mention of complication   . Unspecified pruritic disorder   . Insomnia, unspecified   . Synovial cyst of popliteal space   . Spinal stenosis, other region   . Sciatica   . Lumbago   . Unspecified vitamin D deficiency   . Anxiety   . Hyperlipidemia   . Abnormality of gait   . Unspecified sinusitis (chronic)   . Unspecified pruritic disorder   . Unspecified viral infection, in conditions classified elsewhere and of unspecified site   . Pain in joint,  lower leg   . Depressive disorder, not elsewhere classified   . Unspecified glaucoma   . Reflux esophagitis   . Diverticulosis of colon (without mention of hemorrhage)   . Pain in joint, site unspecified   . Myalgia and myositis, unspecified   . Osteoporosis, unspecified   . Other malaise and fatigue   . Cervicalgia   . Senile osteoporosis   . Benign neoplasm of colon   . H/O echocardiogram 09/01/11    EF >55%, mild MR, RV systolic pressure elevated at 40-98 mmHg, mod TR  . Normal cardiac stress test 11/16/09    low risk scan, EF  77%  . Stroke   . Shortness of breath     Past Surgical History  Procedure Laterality Date  . Lumbar laminectomy  12/04/2012    Complete decompressive L3-4 and L4-5--Dr. Darrelyn Hillock  . Abdominal hysterectomy    . Cholecystectomy    . Cardiovascular stress test  11/16/2009    Perfusion defect seen in inferior myocardial region consistent with diaphragmatic attenuation. Remaining myocardium demonstrates normalmyocardial perfusion with no evidence of ischemia or infarct. No ECG changes. EKG negative for ischemia.  . Transthoracic echocardiogram  09/01/2011    EF >55% and moderate tricuspid valve regurg.    Current Outpatient Prescriptions  Medication Sig Dispense Refill  . atorvastatin (LIPITOR) 20 MG tablet  TAKE 1 TABLET BY MOUTH EVERY EVENING TO CONTROL CHOLESTEROL 90 tablet 1  . Calcium Carbonate-Vitamin D (CALCIUM 600+D) 600-400 MG-UNIT per tablet Take one tablet by mouth daily for calcium supplement 30 tablet 5  . cetirizine (ZYRTEC) 10 MG tablet Take 1 tablet by mouth daily.  11  . cholecalciferol (VITAMIN D) 1000 UNITS tablet Take one tablet once daily for vitamin d    . clopidogrel (PLAVIX) 75 MG tablet TAKE 1 TABLET BY MOUTH EVERY MORNING FOR ANTICOAGULATION 90 tablet 1  . clotrimazole (LOTRIMIN) 1 % cream Apply twice daily to rash until resolved 30 g 1  . DULoxetine (CYMBALTA) 30 MG capsule One daily to help nerves 30 capsule 3  . furosemide (LASIX) 20  MG tablet Take one every other day for diuretic 30 tablet 5  . latanoprost (XALATAN) 0.005 % ophthalmic solution INSTILL 1 DROP TO BOTH EYES AT BEDTIME TO TREAT GLAUCOMA 7.5 mL 3  . losartan (COZAAR) 100 MG tablet TAKE 1 TABLET BY MOUTH ONCE DAILY FOR BLOOD PRESSURE 90 tablet 1  . metoprolol (LOPRESSOR) 100 MG tablet TAKE 1 TABLET BY MOUTH TWICE DAILY TO CONTORL BLOOD PRESSURE 180 tablet 1   No current facility-administered medications for this visit.    Allergies:   Review of patient's allergies indicates no known allergies.    Social History:  The patient  reports that she has never smoked. She does not have any smokeless tobacco history on file. She reports that she does not drink alcohol or use illicit drugs.   Family History:  The patient's family history includes Cancer in her sister; Stroke in her father and mother.    ROS:  Please see the history of present illness. All other systems are reviewed and negative.    PHYSICAL EXAM: VS:  BP 128/80 mmHg  Pulse 59  Ht  (1.575 m)  Wt 165 lb (74.844 kg)  BMI 30.17 kg/m2 , BMI Body mass index is 30.17 kg/(m^2). GEN: Well nourished, well developed, elderly female in no acute distress HEENT: normal for age Neck: no JVD, carotid bruits, or masses Cardiac: RRR; no murmurs, rubs, or gallops, trace edema  Respiratory:  clear to auscultation bilaterally, normal work of breathing GI: soft, nontender, nondistended, + BS MS: no deformity or atrophy Skin: warm and dry, no rash Neuro:  Strength and sensation are intact Psych: euthymic mood, full affect   EKG:  EKG is ordered today. The ekg ordered today demonstrates sinus rhythm, T wave changes in leads V3 through 5 are consistent with an old ECG.   Recent Labs: 02/21/2014: ALT 12; TSH 3.190 04/25/2014: BUN 14; Creatinine, Ser 0.88; Hemoglobin 13.1; Platelets 203; Potassium 4.2; Sodium 140    Lipid Panel    Component Value Date/Time   CHOL 179 02/21/2014 1046   CHOL 187  03/01/2013 0516   TRIG 219* 02/21/2014 1046   HDL 46 02/21/2014 1046   HDL 60 03/01/2013 0516   CHOLHDL 3.9 02/21/2014 1046   CHOLHDL 3.1 03/01/2013 0516   VLDL 20 03/01/2013 0516   LDLCALC 89 02/21/2014 1046   LDLCALC 107* 03/01/2013 0516     Wt Readings from Last 3 Encounters:  01/13/15 165 lb (74.844 kg)  12/24/14 165 lb 12.8 oz (75.206 kg)  11/04/14 167 lb (75.751 kg)     Other studies Reviewed: Additional studies/ records that were reviewed today include: ECGs, office records and hospital records.  ASSESSMENT AND PLAN:  1.  Preoperative evaluation: Based on the ACS risk calculator, her risk  for serious complications with this surgery is 6.2%, above average. She has a greater than 70% chance of needing a facility for rehabilitation, but the patient is already aware of this and prefers to go to a facility for rehabilitation rather than trying to do rehabilitation at home.  She has no history of CAD. Her blood pressure and heart rate are well controlled. Her dyspnea on exertion has not changed recently. We will check an echocardiogram to confirm that her EF is normal.   Unless her echo has significantly changed from prior studies, no further workup is indicated and she is at increased but acceptable risk for the procedure.   Current medicines are reviewed at length with the patient today.  The patient does not have concerns regarding medicines.  The following changes have been made:  no change. They will undoubtedly ask her to hold her Plavix prior to the surgery and this is okay. She is encouraged to take her beta blocker the day of the surgery.  Labs/ tests ordered today include:   Orders Placed This Encounter  Procedures  . EKG 12-Lead  . ECHOCARDIOGRAM COMPLETE     Disposition:   FU with Dr. Allyson Sabal after the surgery.   Tawny Asal  01/13/2015 5:02 PM    [facility] [address] [place], [place], [place]  [address] Phone: [phone]; Fax: [phone]

## 2015-01-13 NOTE — Patient Instructions (Signed)
Your physician has requested that you have an echocardiogram. Echocardiography is a painless test that uses sound waves to create images of your heart. It provides your doctor with information about the size and shape of your heart and how well your heart's chambers and valves are working. This procedure takes approximately one hour. There are no restrictions for this procedure.  Theodore Demark, PA-C, recommends that you schedule a follow-up appointment in first available after surgery with Dr Allyson Sabal.

## 2015-01-14 DIAGNOSIS — M1711 Unilateral primary osteoarthritis, right knee: Secondary | ICD-10-CM | POA: Diagnosis not present

## 2015-01-16 ENCOUNTER — Telehealth: Payer: Self-pay | Admitting: *Deleted

## 2015-01-16 NOTE — Telephone Encounter (Signed)
Office not was faxed and send via Epic to [facility] [employer]

## 2015-01-22 ENCOUNTER — Ambulatory Visit (HOSPITAL_COMMUNITY): Payer: Medicare Other | Attending: Cardiovascular Disease

## 2015-01-22 DIAGNOSIS — I340 Nonrheumatic mitral (valve) insufficiency: Secondary | ICD-10-CM | POA: Diagnosis not present

## 2015-01-22 DIAGNOSIS — Z01810 Encounter for preprocedural cardiovascular examination: Secondary | ICD-10-CM

## 2015-01-22 DIAGNOSIS — I071 Rheumatic tricuspid insufficiency: Secondary | ICD-10-CM | POA: Insufficient documentation

## 2015-01-22 DIAGNOSIS — I371 Nonrheumatic pulmonary valve insufficiency: Secondary | ICD-10-CM | POA: Diagnosis not present

## 2015-01-22 DIAGNOSIS — Z01818 Encounter for other preprocedural examination: Secondary | ICD-10-CM | POA: Insufficient documentation

## 2015-01-26 DIAGNOSIS — H401131 Primary open-angle glaucoma, bilateral, mild stage: Secondary | ICD-10-CM | POA: Diagnosis not present

## 2015-01-26 DIAGNOSIS — H353133 Nonexudative age-related macular degeneration, bilateral, advanced atrophic without subfoveal involvement: Secondary | ICD-10-CM | POA: Diagnosis not present

## 2015-01-30 NOTE — Patient Instructions (Addendum)
YOUR PROCEDURE IS SCHEDULED ON :  02/11/15  REPORT TO [facility] MAIN ENTRANCE FOLLOW SIGNS TO EAST ELEVATOR - GO TO 3rd FLOOR CHECK IN AT 3 EAST NURSES STATION (SHORT STAY) AT:  5:30 am  CALL THIS NUMBER IF YOU HAVE PROBLEMS THE MORNING OF SURGERY [phone]  REMEMBER:ONLY 1 PER PERSON MAY GO TO SHORT STAY WITH YOU TO GET READY THE MORNING OF YOUR SURGERY  DO NOT EAT FOOD OR DRINK LIQUIDS AFTER MIDNIGHT  TAKE THESE MEDICINES THE MORNING OF SURGERY: CYMBALTA / METOPROLOL  YOU MAY NOT HAVE ANY METAL ON YOUR BODY INCLUDING HAIR PINS AND PIERCING'S. DO NOT WEAR JEWELRY, MAKEUP, LOTIONS, POWDERS OR PERFUMES. DO NOT WEAR NAIL POLISH. DO NOT SHAVE 48 HRS PRIOR TO SURGERY. MEN MAY SHAVE FACE AND NECK.  DO NOT BRING VALUABLES TO HOSPITAL. [facility] IS NOT RESPONSIBLE FOR VALUABLES.  CONTACTS, DENTURES OR PARTIALS MAY NOT BE WORN TO SURGERY. LEAVE SUITCASE IN CAR. CAN BE BROUGHT TO ROOM AFTER SURGERY.  PATIENTS DISCHARGED THE DAY OF SURGERY WILL NOT BE ALLOWED TO DRIVE HOME.  PLEASE READ OVER THE FOLLOWING INSTRUCTION SHEETS _________________________________________________________________________________                                          [place] [facility] - PREPARING FOR SURGERY  Before surgery, you can play an important role.  Because skin is not sterile, your skin needs to be as free of germs as possible.  You can reduce the number of germs on your skin by washing with CHG (chlorahexidine gluconate) soap before surgery.  CHG is an antiseptic cleaner which kills germs and bonds with the skin to continue killing germs even after washing. Please DO NOT use if you have an allergy to CHG or antibacterial soaps.  If your skin becomes reddened/irritated stop using the CHG and inform your nurse when you arrive at Short Stay. Do not shave (including legs and underarms) for at least 48 hours prior to the first CHG shower.  You may shave your face. Please follow these  instructions carefully:   1.  Shower with CHG Soap the night before surgery and the  morning of Surgery.   2.  If you choose to wash your hair, wash your hair first as usual with your  normal  Shampoo.   3.  After you shampoo, rinse your hair and body thoroughly to remove the  shampoo.                                         4.  Use CHG as you would any other liquid soap.  You can apply chg directly  to the skin and wash . Gently wash with scrungie or clean wascloth    5.  Apply the CHG Soap to your body ONLY FROM THE NECK DOWN.   Do not use on open                           Wound or open sores. Avoid contact with eyes, ears mouth and genitals (private parts).                        Genitals (private parts) with your normal soap.  6.  Wash thoroughly, paying special attention to the area where your surgery  will be performed.   7.  Thoroughly rinse your body with warm water from the neck down.   8.  DO NOT shower/wash with your normal soap after using and rinsing off  the CHG Soap .                9.  Pat yourself dry with a clean towel.             10.  Wear clean night clothes to bed after shower             11.  Place clean sheets on your bed the night of your first shower and do not  sleep with pets.  Day of Surgery : Do not apply any lotions/deodorants the morning of surgery.  Please wear clean clothes to the hospital/surgery center.  FAILURE TO FOLLOW THESE INSTRUCTIONS MAY RESULT IN THE CANCELLATION OF YOUR SURGERY    PATIENT SIGNATURE_________________________________  ______________________________________________________________________     Adam Phenix  An incentive spirometer is a tool that can help keep your lungs clear and active. This tool measures how well you are filling your lungs with each breath. Taking long deep breaths may help reverse or decrease the chance of developing breathing (pulmonary) problems (especially infection)  following:  A long period of time when you are unable to move or be active. BEFORE THE PROCEDURE   If the spirometer includes an indicator to show your best effort, your nurse or respiratory therapist will set it to a desired goal.  If possible, sit up straight or lean slightly forward. Try not to slouch.  Hold the incentive spirometer in an upright position. INSTRUCTIONS FOR USE   Sit on the edge of your bed if possible, or sit up as far as you can in bed or on a chair.  Hold the incentive spirometer in an upright position.  Breathe out normally.  Place the mouthpiece in your mouth and seal your lips tightly around it.  Breathe in slowly and as deeply as possible, raising the piston or the ball toward the top of the column.  Hold your breath for 3-5 seconds or for as long as possible. Allow the piston or ball to fall to the bottom of the column.  Remove the mouthpiece from your mouth and breathe out normally.  Rest for a few seconds and repeat Steps 1 through 7 at least 10 times every 1-2 hours when you are awake. Take your time and take a few normal breaths between deep breaths.  The spirometer may include an indicator to show your best effort. Use the indicator as a goal to work toward during each repetition.  After each set of 10 deep breaths, practice coughing to be sure your lungs are clear. If you have an incision (the cut made at the time of surgery), support your incision when coughing by placing a pillow or rolled up towels firmly against it. Once you are able to get out of bed, walk around indoors and cough well. You may stop using the incentive spirometer when instructed by your caregiver.  RISKS AND COMPLICATIONS  Take your time so you do not get dizzy or light-headed.  If you are in pain, you may need to take or ask for pain medication before doing incentive spirometry. It is harder to take a deep breath if you are having pain. AFTER USE  Rest and breathe slowly  and  easily.  It can be helpful to keep track of a log of your progress. Your caregiver can provide you with a simple table to help with this. If you are using the spirometer at home, follow these instructions: [place] IF:   You are having difficultly using the spirometer.  You have trouble using the spirometer as often as instructed.  Your pain medication is not giving enough relief while using the spirometer.  You develop fever of 100.5 F (38.1 C) or higher. SEEK IMMEDIATE MEDICAL CARE IF:   You cough up bloody sputum that had not been present before.  You develop fever of 102 F (38.9 C) or greater.  You develop worsening pain at or near the incision site. MAKE SURE YOU:   Understand these instructions.  Will watch your condition.  Will get help right away if you are not doing well or get worse. Document Released: 08/15/2006 Document Revised: 06/27/2011 Document Reviewed: 10/16/2006 Exit[employer] Patient Information 2014 Exit[employer], [place].   ________________________________________________________________________  WHAT IS A BLOOD TRANSFUSION? Blood Transfusion Information  A transfusion is the replacement of blood or some of its parts. Blood is made up of multiple cells which provide different functions.  Red blood cells carry oxygen and are used for blood loss replacement.  White blood cells fight against infection.  Platelets control bleeding.  Plasma helps clot blood.  Other blood products are available for specialized needs, such as hemophilia or other clotting disorders. BEFORE THE TRANSFUSION  Who gives blood for transfusions?   Healthy volunteers who are fully evaluated to make sure their blood is safe. This is blood bank blood. Transfusion therapy is the safest it has ever been in the practice of medicine. Before blood is taken from a donor, a complete history is taken to make sure that person has no history of diseases nor engages in risky social  behavior (examples are intravenous drug use or sexual activity with multiple partners). The donor's travel history is screened to minimize risk of transmitting infections, such as malaria. The donated blood is tested for signs of infectious diseases, such as HIV and hepatitis. The blood is then tested to be sure it is compatible with you in order to minimize the chance of a transfusion reaction. If you or a relative donates blood, this is often done in anticipation of surgery and is not appropriate for emergency situations. It takes many days to process the donated blood. RISKS AND COMPLICATIONS Although transfusion therapy is very safe and saves many lives, the main dangers of transfusion include:   Getting an infectious disease.  Developing a transfusion reaction. This is an allergic reaction to something in the blood you were given. Every precaution is taken to prevent this. The decision to have a blood transfusion has been considered carefully by your caregiver before blood is given. Blood is not given unless the benefits outweigh the risks. AFTER THE TRANSFUSION  Right after receiving a blood transfusion, you will usually feel much better and more energetic. This is especially true if your red blood cells have gotten low (anemic). The transfusion raises the level of the red blood cells which carry oxygen, and this usually causes an energy increase.  The nurse administering the transfusion will monitor you carefully for complications. HOME CARE INSTRUCTIONS  No special instructions are needed after a transfusion. You may find your energy is better. Speak with your caregiver about any limitations on activity for underlying diseases you may have. SEEK MEDICAL CARE IF:  Your condition is not improving after your transfusion.  You develop redness or irritation at the intravenous (IV) site. SEEK IMMEDIATE MEDICAL CARE IF:  Any of the following symptoms occur over the next 12 hours:  Shaking  chills.  You have a temperature by mouth above 102 F (38.9 C), not controlled by medicine.  Chest, back, or muscle pain.  People around you feel you are not acting correctly or are confused.  Shortness of breath or difficulty breathing.  Dizziness and fainting.  You get a rash or develop hives.  You have a decrease in urine output.  Your urine turns a dark color or changes to pink, red, or brown. Any of the following symptoms occur over the next 10 days:  You have a temperature by mouth above 102 F (38.9 C), not controlled by medicine.  Shortness of breath.  Weakness after normal activity.  The white part of the eye turns yellow (jaundice).  You have a decrease in the amount of urine or are urinating less often.  Your urine turns a dark color or changes to pink, red, or brown. Document Released: 04/01/2000 Document Revised: 06/27/2011 Document Reviewed: 11/19/2007 [facility] Patient Information 2014 [place], [place].  _______________________________________________________________________

## 2015-02-02 ENCOUNTER — Other Ambulatory Visit: Payer: Self-pay | Admitting: Internal Medicine

## 2015-02-03 ENCOUNTER — Encounter (HOSPITAL_COMMUNITY)
Admission: RE | Admit: 2015-02-03 | Discharge: 2015-02-03 | Disposition: A | Discharge status: Home / Self Care | Payer: Medicare Other | Source: Ambulatory Visit | Attending: Orthopedic Surgery | Admitting: Orthopedic Surgery

## 2015-02-03 ENCOUNTER — Encounter (HOSPITAL_COMMUNITY): Payer: Self-pay

## 2015-02-03 DIAGNOSIS — Z01818 Encounter for other preprocedural examination: Secondary | ICD-10-CM | POA: Insufficient documentation

## 2015-02-03 DIAGNOSIS — M179 Osteoarthritis of knee, unspecified: Secondary | ICD-10-CM | POA: Diagnosis not present

## 2015-02-03 LAB — CBC WITH DIFFERENTIAL/PLATELET
Basophils Absolute: 0 10*3/uL (ref 0.0–0.1)
Basophils Relative: 1 %
Eosinophils Absolute: 0.2 10*3/uL (ref 0.0–0.7)
Eosinophils Relative: 2 %
HCT: 41 % (ref 36.0–46.0)
Hemoglobin: 13.3 g/dL (ref 12.0–15.0)
Lymphocytes Relative: 28 %
Lymphs Abs: 1.8 10*3/uL (ref 0.7–4.0)
MCH: 30.4 pg (ref 26.0–34.0)
MCHC: 32.4 g/dL (ref 30.0–36.0)
MCV: 93.8 fL (ref 78.0–100.0)
Monocytes Absolute: 0.8 10*3/uL (ref 0.1–1.0)
Monocytes Relative: 13 %
Neutro Abs: 3.5 10*3/uL (ref 1.7–7.7)
Neutrophils Relative %: 56 %
Platelets: 197 10*3/uL (ref 150–400)
RBC: 4.37 MIL/uL (ref 3.87–5.11)
RDW: 13.7 % (ref 11.5–15.5)
WBC: 6.3 10*3/uL (ref 4.0–10.5)

## 2015-02-03 LAB — APTT: APTT: 25 s (ref 24–37)

## 2015-02-03 LAB — COMPREHENSIVE METABOLIC PANEL
ALT: 21 U/L (ref 14–54)
AST: 26 U/L (ref 15–41)
Albumin: 4.2 g/dL (ref 3.5–5.0)
Alkaline Phosphatase: 73 U/L (ref 38–126)
Anion gap: 7 (ref 5–15)
BUN: 19 mg/dL (ref 6–20)
CO2: 31 mmol/L (ref 22–32)
Calcium: 9.6 mg/dL (ref 8.9–10.3)
Chloride: 103 mmol/L (ref 101–111)
Creatinine, Ser: 0.9 mg/dL (ref 0.44–1.00)
GFR calc Af Amer: >60 mL/min (ref >60)
GFR calc non Af Amer: 56 mL/min — ABNORMAL LOW (ref >60)
Glucose, Bld: 101 mg/dL — ABNORMAL HIGH (ref 65–99)
Potassium: 3.9 mmol/L (ref 3.5–5.1)
Sodium: 141 mmol/L (ref 135–145)
Total Bilirubin: 0.7 mg/dL (ref 0.3–1.2)
Total Protein: 7 g/dL (ref 6.5–8.1)

## 2015-02-03 LAB — URINALYSIS, ROUTINE W REFLEX MICROSCOPIC
Bilirubin Urine: NEGATIVE
Glucose, UA: NEGATIVE mg/dL
Hgb urine dipstick: NEGATIVE
Ketones, ur: NEGATIVE mg/dL
Leukocytes, UA: NEGATIVE
Nitrite: NEGATIVE
Protein, ur: NEGATIVE mg/dL
Specific Gravity, Urine: 1.011 (ref 1.005–1.030)
Urobilinogen, UA: 1 mg/dL (ref 0.0–1.0)
pH: 7 (ref 5.0–8.0)

## 2015-02-03 LAB — SURGICAL PCR SCREEN
MRSA, PCR: NEGATIVE
Staphylococcus aureus: NEGATIVE

## 2015-02-03 LAB — PROTIME-INR
INR: 0.96 (ref 0.00–1.49)
Prothrombin Time: 13 seconds (ref 11.6–15.2)

## 2015-02-04 NOTE — H&P (Signed)
TOTAL KNEE ADMISSION H&P  Patient is being admitted for right total knee arthroplasty.  Subjective:  Chief Complaint:right knee pain.  HPI: Kristin Wilkerson, 81 y.o. female, has a history of pain and functional disability in the right knee due to arthritis and has failed non-surgical conservative treatments for greater than 12 weeks to includeNSAID's and/or analgesics, corticosteriod injections, viscosupplementation injections, use of assistive devices and activity modification.  Onset of symptoms was gradual, starting 5 years ago with gradually worsening course since that time. The patient noted no past surgery on the right knee(s).  Patient currently rates pain in the right knee(s) at 7 out of 10 with activity. Patient has night pain, worsening of pain with activity and weight bearing, pain that interferes with activities of daily living, pain with passive range of motion, crepitus and joint swelling.  Patient has evidence of periarticular osteophytes and joint space narrowing by imaging studies. There is no active infection.  Patient Active Problem List   Diagnosis Date Noted  . 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 Monilial vulvovaginitis 09/24/2014  . Cough 09/24/2014  . Tracheobronchitis 08/06/2014  . Deaf 07/02/2014  . Right knee pain 07/02/2014  . History of shingles 2015 02/14/2014  . Depression, major, recurrent, moderate (HCC) 01/07/2014  . Grief 11/27/2013  . Left arm pain 11/06/2013  . Corn of foot 10/23/2013  . Leg edema 09/03/2013  . Insomnia 07/02/2013  . Diastolic CHF (HCC) 03/02/2013  . Pulmonary hypertension (HCC) 03/02/2013  . History of CVA (cerebrovascular accident) 03/01/2013  . HTN (hypertension) 03/01/2013  . HLD (hyperlipidemia) 03/01/2013  . Tachycardia 12/10/2012  . Anxiety 12/10/2012  . Myalgia and myositis, unspecified 09/18/2012  . Other malaise and fatigue 09/18/2012  . Backache 03/31/2008   Past Medical History  Diagnosis Date  . Hypertension   .  Abnormality of gait   . Disturbance of skin sensation   . Tachycardia, unspecified   . Internal hemorrhoids without mention of complication   . Synovial cyst of popliteal space   . Spinal stenosis, other region   . Sciatica   . Lumbago   . Unspecified vitamin D deficiency   . Anxiety   . Hyperlipidemia   . Abnormality of gait   . Unspecified sinusitis (chronic)   . Unspecified pruritic disorder   . Pain in joint, lower leg   . Depressive disorder, not elsewhere classified   . Unspecified glaucoma   . Reflux esophagitis   . Diverticulosis of colon (without mention of hemorrhage)   . Pain in joint, site unspecified   . Myalgia and myositis, unspecified   . Osteoporosis, unspecified   . Other malaise and fatigue   . Cervicalgia   . Benign neoplasm of colon   . H/O echocardiogram 09/01/11    EF >55%, mild MR, RV systolic pressure elevated at 16-10 mmHg, mod TR  . Normal cardiac stress test 11/16/09    low risk scan, EF  77%  . Shortness of breath   . Stroke [facility]) "a couple of years ago"     visual problems left eye  . Arthritis   . Swelling of both ankles     rt leg  . Bruises easily     Past Surgical History  Procedure Laterality Date  . Lumbar laminectomy  12/04/2012    Complete decompressive L3-4 and L4-5--Dr. Darrelyn Hillock  . Abdominal hysterectomy    . Cholecystectomy    . Cardiovascular stress test  11/16/2009    Perfusion defect seen in inferior myocardial region  consistent with diaphragmatic attenuation. Remaining myocardium demonstrates normalmyocardial perfusion with no evidence of ischemia or infarct. No ECG changes. EKG negative for ischemia.  . Transthoracic echocardiogram  09/01/2011    EF >55% and moderate tricuspid valve regurg.  . Bilateral oophorectomy       Current outpatient prescriptions:  .  acetaminophen (TYLENOL) 500 MG tablet, Take 1,000 mg by mouth daily as needed for moderate pain., Disp: , Rfl:  .  atorvastatin (LIPITOR) 20 MG tablet, TAKE 1 TABLET BY  MOUTH EVERY EVENING TO CONTROL CHOLESTEROL, Disp: 90 tablet, Rfl: 1 .  Calcium Carbonate-Vitamin D (CALCIUM 600+D) 600-400 MG-UNIT per tablet, Take one tablet by mouth daily for calcium supplement, Disp: 30 tablet, Rfl: 5 .  cetirizine (ZYRTEC) 10 MG tablet, Take 1 tablet by mouth daily., Disp: , Rfl: 11 .  cholecalciferol (VITAMIN D) 1000 UNITS tablet, Take 1,000 Units by mouth daily. , Disp: , Rfl:  .  clopidogrel (PLAVIX) 75 MG tablet, TAKE 1 TABLET BY MOUTH EVERY MORNING FOR ANTICOAGULATION, Disp: 90 tablet, Rfl: 1 .  clotrimazole (LOTRIMIN) 1 % cream, Apply twice daily to rash until resolved, Disp: 30 g, Rfl: 1 .  DULoxetine (CYMBALTA) 30 MG capsule, One daily to help nerves (Patient taking differently: Take 30 mg by mouth daily. One daily to help nerves), Disp: 30 capsule, Rfl: 3 .  furosemide (LASIX) 20 MG tablet, Take one every other day for diuretic (Patient taking differently: Take 20 mg by mouth daily. ), Disp: 30 tablet, Rfl: 5 .  latanoprost (XALATAN) 0.005 % ophthalmic solution, INSTILL 1 DROP TO BOTH EYES AT BEDTIME TO TREAT GLAUCOMA, Disp: 7.5 mL, Rfl: 3 .  losartan (COZAAR) 100 MG tablet, TAKE 1 TABLET BY MOUTH ONCE DAILY FOR BLOOD PRESSURE, Disp: 90 tablet, Rfl: 1 .  metoprolol (LOPRESSOR) 100 MG tablet, TAKE 1 TABLET BY MOUTH TWICE DAILY TO CONTORL BLOOD PRESSURE, Disp: 180 tablet, Rfl: 1 .  furosemide (LASIX) 20 MG tablet, TAKE 1 TABLET BY MOUTH EVERY DAY, Disp: 30 tablet, Rfl: 5  No Known Allergies  Social History  Substance Use Topics  . Smoking status: Never Smoker   . Smokeless tobacco: Not on file  . Alcohol Use: No    Family History  Problem Relation Age of Onset  . Cancer Sister   . Stroke Mother   . Stroke Father      Review of Systems  Constitutional: Negative.   HENT: Positive for hearing loss. Negative for congestion, ear discharge, ear pain, nosebleeds, sore throat and tinnitus.   Eyes: Negative.   Respiratory: Positive for shortness of breath and  wheezing. Negative for cough, hemoptysis, sputum production and stridor.        SOB with exertion  Cardiovascular: Negative.   Gastrointestinal: Negative.   Genitourinary: Positive for frequency. Negative for dysuria, urgency, hematuria and flank pain.  Musculoskeletal: Positive for myalgias, back pain and joint pain. Negative for falls and neck pain.  Skin: Negative.   Neurological: Negative.  Negative for headaches.  Endo/Heme/Allergies: Negative for environmental allergies and polydipsia. Bruises/bleeds easily.  Psychiatric/Behavioral: Positive for memory loss. Negative for depression, suicidal ideas, hallucinations and substance abuse. The patient is not nervous/anxious and does not have insomnia.     Objective:  Physical Exam  Constitutional: She is oriented to person, place, and time. She appears well-developed and well-nourished. No distress.  HENT:  Head: Normocephalic and atraumatic.  Right Ear: External ear normal.  Left Ear: External ear normal.  Nose: Nose normal.  Mouth/Throat: Oropharynx is  clear and moist.  Eyes: Conjunctivae and EOM are normal.  Neck: Normal range of motion. Neck supple.  Cardiovascular: Normal rate, regular rhythm, normal heart sounds and intact distal pulses.   No murmur heard. Respiratory: Effort normal and breath sounds normal. No respiratory distress. She has no wheezes.  GI: Soft. Bowel sounds are normal. She exhibits no distension. There is no tenderness.  Musculoskeletal:       Right hip: Normal.       Left hip: Normal.       Right knee: She exhibits decreased range of motion and swelling. She exhibits no effusion and no erythema. Tenderness found. Medial joint line and lateral joint line tenderness noted.       Left knee: She exhibits decreased range of motion and swelling. She exhibits no effusion and no erythema. Tenderness found. Medial joint line and lateral joint line tenderness noted.  Neurological: She is alert and oriented to person,  place, and time. She has normal strength and normal reflexes. No sensory deficit.  Skin: No rash noted. She is not diaphoretic. No erythema.  Psychiatric: She has a normal mood and affect. Her behavior is normal.    Vital signs in last 24 hours: Temp:  [98.1 F (36.7 C)] 98.1 F (36.7 C) (10/18 1438) Pulse Rate:  [68] 68 (10/18 1438) Resp:  [16] 16 (10/18 1438) BP: (165)/(69) 165/69 mmHg (10/18 1438) SpO2:  [96 %] 96 % (10/18 1438) Weight:  [75.297 kg (166 lb)] 75.297 kg (166 lb) (10/18 1438)   Imaging Review Plain radiographs demonstrate severe degenerative joint disease of the right knee(s). The overall alignment ismild varus. The bone quality appears to be good for age and reported activity level.  Assessment/Plan:  End stage primary osteoarthritis, right knee   The patient history, physical examination, clinical judgment of the provider and imaging studies are consistent with end stage degenerative joint disease of the right knee(s) and total knee arthroplasty is deemed medically necessary. The treatment options including medical management, injection therapy arthroscopy and arthroplasty were discussed at length. The risks and benefits of total knee arthroplasty were presented and reviewed. The risks due to aseptic loosening, infection, stiffness, patella tracking problems, thromboembolic complications and other imponderables were discussed. The patient acknowledged the explanation, agreed to proceed with the plan and consent was signed. Patient is being admitted for inpatient treatment for surgery, pain control, PT, OT, prophylactic antibiotics, VTE prophylaxis, progressive ambulation and ADL's and discharge planning. The patient is planning to be discharged to skilled nursing facility [facility])  Cardio: Dr. Valda Favia, PA-C

## 2015-02-11 ENCOUNTER — Inpatient Hospital Stay (HOSPITAL_COMMUNITY)
Admission: RE | Admit: 2015-02-11 | Discharge: 2015-02-14 | DRG: 470 | Disposition: A | Discharge status: Skilled Nursing Facility | Payer: Medicare Other | Source: Ambulatory Visit | Attending: Orthopedic Surgery | Admitting: Orthopedic Surgery

## 2015-02-11 ENCOUNTER — Inpatient Hospital Stay (HOSPITAL_COMMUNITY): Payer: Medicare Other | Admitting: Anesthesiology

## 2015-02-11 ENCOUNTER — Encounter (HOSPITAL_COMMUNITY): Payer: Self-pay | Admitting: *Deleted

## 2015-02-11 ENCOUNTER — Encounter (HOSPITAL_COMMUNITY)
Admission: RE | Disposition: A | Discharge status: Skilled Nursing Facility | Payer: Self-pay | Source: Ambulatory Visit | Attending: Orthopedic Surgery

## 2015-02-11 DIAGNOSIS — F418 Other specified anxiety disorders: Secondary | ICD-10-CM | POA: Diagnosis present

## 2015-02-11 DIAGNOSIS — Z79899 Other long term (current) drug therapy: Secondary | ICD-10-CM | POA: Diagnosis not present

## 2015-02-11 DIAGNOSIS — Z8673 Personal history of transient ischemic attack (TIA), and cerebral infarction without residual deficits: Secondary | ICD-10-CM | POA: Diagnosis not present

## 2015-02-11 DIAGNOSIS — E559 Vitamin D deficiency, unspecified: Secondary | ICD-10-CM | POA: Diagnosis present

## 2015-02-11 DIAGNOSIS — I110 Hypertensive heart disease with heart failure: Secondary | ICD-10-CM | POA: Diagnosis present

## 2015-02-11 DIAGNOSIS — I69398 Other sequelae of cerebral infarction: Secondary | ICD-10-CM

## 2015-02-11 DIAGNOSIS — Z7902 Long term (current) use of antithrombotics/antiplatelets: Secondary | ICD-10-CM | POA: Diagnosis not present

## 2015-02-11 DIAGNOSIS — M1711 Unilateral primary osteoarthritis, right knee: Secondary | ICD-10-CM | POA: Diagnosis not present

## 2015-02-11 DIAGNOSIS — F329 Major depressive disorder, single episode, unspecified: Secondary | ICD-10-CM | POA: Diagnosis present

## 2015-02-11 DIAGNOSIS — G8918 Other acute postprocedural pain: Secondary | ICD-10-CM | POA: Diagnosis not present

## 2015-02-11 DIAGNOSIS — H9190 Unspecified hearing loss, unspecified ear: Secondary | ICD-10-CM | POA: Diagnosis present

## 2015-02-11 DIAGNOSIS — I10 Essential (primary) hypertension: Secondary | ICD-10-CM | POA: Diagnosis not present

## 2015-02-11 DIAGNOSIS — Z96651 Presence of right artificial knee joint: Secondary | ICD-10-CM | POA: Diagnosis not present

## 2015-02-11 DIAGNOSIS — H409 Unspecified glaucoma: Secondary | ICD-10-CM | POA: Diagnosis present

## 2015-02-11 DIAGNOSIS — E669 Obesity, unspecified: Secondary | ICD-10-CM | POA: Diagnosis present

## 2015-02-11 DIAGNOSIS — Z6831 Body mass index (BMI) 31.0-31.9, adult: Secondary | ICD-10-CM

## 2015-02-11 DIAGNOSIS — Z01812 Encounter for preprocedural laboratory examination: Secondary | ICD-10-CM | POA: Diagnosis not present

## 2015-02-11 DIAGNOSIS — E785 Hyperlipidemia, unspecified: Secondary | ICD-10-CM | POA: Diagnosis present

## 2015-02-11 DIAGNOSIS — M25561 Pain in right knee: Secondary | ICD-10-CM | POA: Diagnosis present

## 2015-02-11 DIAGNOSIS — M179 Osteoarthritis of knee, unspecified: Secondary | ICD-10-CM | POA: Diagnosis not present

## 2015-02-11 DIAGNOSIS — Z8619 Personal history of other infectious and parasitic diseases: Secondary | ICD-10-CM

## 2015-02-11 DIAGNOSIS — M6281 Muscle weakness (generalized): Secondary | ICD-10-CM | POA: Diagnosis not present

## 2015-02-11 DIAGNOSIS — M810 Age-related osteoporosis without current pathological fracture: Secondary | ICD-10-CM | POA: Diagnosis present

## 2015-02-11 DIAGNOSIS — R278 Other lack of coordination: Secondary | ICD-10-CM | POA: Diagnosis not present

## 2015-02-11 DIAGNOSIS — I5032 Chronic diastolic (congestive) heart failure: Secondary | ICD-10-CM | POA: Diagnosis present

## 2015-02-11 DIAGNOSIS — J302 Other seasonal allergic rhinitis: Secondary | ICD-10-CM | POA: Diagnosis not present

## 2015-02-11 DIAGNOSIS — M21161 Varus deformity, not elsewhere classified, right knee: Secondary | ICD-10-CM | POA: Diagnosis present

## 2015-02-11 DIAGNOSIS — R2681 Unsteadiness on feet: Secondary | ICD-10-CM | POA: Diagnosis not present

## 2015-02-11 DIAGNOSIS — Z4789 Encounter for other orthopedic aftercare: Secondary | ICD-10-CM | POA: Diagnosis not present

## 2015-02-11 HISTORY — PX: TOTAL KNEE ARTHROPLASTY: SHX125

## 2015-02-11 LAB — TYPE AND SCREEN
ABO/RH(D): O POS
Antibody Screen: NEGATIVE

## 2015-02-11 SURGERY — ARTHROPLASTY, KNEE, TOTAL
Anesthesia: General | Laterality: Right

## 2015-02-11 MED ORDER — ONDANSETRON HCL 4 MG/2ML IJ SOLN
INTRAMUSCULAR | Status: DC | PRN
  Administered 2015-02-11: 4 mg via INTRAVENOUS

## 2015-02-11 MED ORDER — HYDROMORPHONE HCL 2 MG/ML IJ SOLN
INTRAMUSCULAR | Status: AC
  Filled 2015-02-11: qty 1

## 2015-02-11 MED ORDER — SUGAMMADEX SODIUM 200 MG/2ML IV SOLN
INTRAVENOUS | Status: AC
  Filled 2015-02-11: qty 2

## 2015-02-11 MED ORDER — LABETALOL HCL 5 MG/ML IV SOLN
INTRAVENOUS | Status: AC
  Filled 2015-02-11: qty 8

## 2015-02-11 MED ORDER — LOSARTAN POTASSIUM 50 MG PO TABS
100.0000 mg | ORAL_TABLET | Freq: Every day | ORAL | Status: DC
  Administered 2015-02-11 – 2015-02-14 (×4): 100 mg via ORAL
  Filled 2015-02-11 (×4): qty 2

## 2015-02-11 MED ORDER — ALUM & MAG HYDROXIDE-SIMETH 200-200-20 MG/5ML PO SUSP
30.0000 mL | ORAL | Status: DC | PRN
Start: 2015-02-11 — End: 2015-02-14

## 2015-02-11 MED ORDER — HYDROMORPHONE HCL 1 MG/ML IJ SOLN: 0.2500 mg | INTRAMUSCULAR | Status: DC | PRN

## 2015-02-11 MED ORDER — LABETALOL HCL 5 MG/ML IV SOLN
INTRAVENOUS | Status: DC | PRN
  Administered 2015-02-11: 5 mg via INTRAVENOUS

## 2015-02-11 MED ORDER — LACTATED RINGERS IV SOLN
INTRAVENOUS | Status: DC | PRN
  Administered 2015-02-11 (×2): via INTRAVENOUS

## 2015-02-11 MED ORDER — SUGAMMADEX SODIUM 200 MG/2ML IV SOLN
INTRAVENOUS | Status: DC | PRN
  Administered 2015-02-11: 200 mg via INTRAVENOUS

## 2015-02-11 MED ORDER — DULOXETINE HCL 30 MG PO CPEP
30.0000 mg | ORAL_CAPSULE | Freq: Every day | ORAL | Status: DC
  Administered 2015-02-11 – 2015-02-14 (×4): 30 mg via ORAL
  Filled 2015-02-11 (×4): qty 1

## 2015-02-11 MED ORDER — THROMBIN 5000 UNITS EX SOLR
OROMUCOSAL | Status: DC | PRN
  Administered 2015-02-11: 5 mL via TOPICAL

## 2015-02-11 MED ORDER — BUPIVACAINE-EPINEPHRINE (PF) 0.5% -1:200000 IJ SOLN
INTRAMUSCULAR | Status: AC
  Filled 2015-02-11: qty 30

## 2015-02-11 MED ORDER — PHENOL 1.4 % MT LIQD
1.0000 | OROMUCOSAL | Status: DC | PRN
  Filled 2015-02-11: qty 177

## 2015-02-11 MED ORDER — SODIUM CHLORIDE 0.9 % IJ SOLN
INTRAMUSCULAR | Status: AC
  Filled 2015-02-11: qty 50

## 2015-02-11 MED ORDER — TRANEXAMIC ACID 1000 MG/10ML IV SOLN
1000.0000 mg | INTRAVENOUS | Status: AC
  Administered 2015-02-11: 1000 mg via INTRAVENOUS
  Filled 2015-02-11: qty 10

## 2015-02-11 MED ORDER — EPHEDRINE SULFATE 50 MG/ML IJ SOLN
INTRAMUSCULAR | Status: AC
  Filled 2015-02-11: qty 1

## 2015-02-11 MED ORDER — FERROUS SULFATE 325 (65 FE) MG PO TABS
325.0000 mg | ORAL_TABLET | Freq: Three times a day (TID) | ORAL | Status: DC
  Administered 2015-02-11 – 2015-02-14 (×7): 325 mg via ORAL
  Filled 2015-02-11 (×11): qty 1

## 2015-02-11 MED ORDER — ONDANSETRON HCL 4 MG/2ML IJ SOLN: 4.0000 mg | Freq: Four times a day (QID) | INTRAMUSCULAR | Status: DC | PRN

## 2015-02-11 MED ORDER — EPHEDRINE SULFATE 50 MG/ML IJ SOLN
INTRAMUSCULAR | Status: DC | PRN
  Administered 2015-02-11: 5 mg via INTRAVENOUS

## 2015-02-11 MED ORDER — OXYCODONE HCL 5 MG/5ML PO SOLN
5.0000 mg | Freq: Once | ORAL | Status: DC | PRN
  Filled 2015-02-11: qty 5

## 2015-02-11 MED ORDER — BUPIVACAINE HCL (PF) 0.25 % IJ SOLN
INTRAMUSCULAR | Status: DC | PRN
  Administered 2015-02-11: 20 mL

## 2015-02-11 MED ORDER — LACTATED RINGERS IV SOLN
INTRAVENOUS | Status: DC
  Administered 2015-02-11 – 2015-02-12 (×2): via INTRAVENOUS

## 2015-02-11 MED ORDER — SODIUM CHLORIDE 0.9 % IR SOLN
Status: AC
  Filled 2015-02-11: qty 1

## 2015-02-11 MED ORDER — ROCURONIUM BROMIDE 100 MG/10ML IV SOLN
INTRAVENOUS | Status: DC | PRN
  Administered 2015-02-11: 30 mg via INTRAVENOUS

## 2015-02-11 MED ORDER — CEFAZOLIN SODIUM-DEXTROSE 2-3 GM-% IV SOLR
2.0000 g | INTRAVENOUS | Status: AC
  Administered 2015-02-11: 2 g via INTRAVENOUS

## 2015-02-11 MED ORDER — LIDOCAINE HCL (CARDIAC) 20 MG/ML IV SOLN
INTRAVENOUS | Status: AC
  Filled 2015-02-11: qty 15

## 2015-02-11 MED ORDER — HYDROMORPHONE HCL 1 MG/ML IJ SOLN
INTRAMUSCULAR | Status: DC | PRN
Start: 2015-02-11 — End: 2015-02-11
  Administered 2015-02-11: 0.5 mg via INTRAVENOUS

## 2015-02-11 MED ORDER — ONDANSETRON HCL 4 MG PO TABS: 4.0000 mg | ORAL_TABLET | Freq: Four times a day (QID) | ORAL | Status: DC | PRN

## 2015-02-11 MED ORDER — LABETALOL HCL 5 MG/ML IV SOLN
10.0000 mg | INTRAVENOUS | Status: AC | PRN
  Administered 2015-02-11 (×2): 10 mg via INTRAVENOUS

## 2015-02-11 MED ORDER — SODIUM CHLORIDE 0.9 % IJ SOLN
INTRAMUSCULAR | Status: AC
  Filled 2015-02-11: qty 10

## 2015-02-11 MED ORDER — SODIUM CHLORIDE 0.9 % IR SOLN
Status: DC | PRN
  Administered 2015-02-11: 1000 mL

## 2015-02-11 MED ORDER — ROCURONIUM BROMIDE 100 MG/10ML IV SOLN
INTRAVENOUS | Status: AC
  Filled 2015-02-11: qty 1

## 2015-02-11 MED ORDER — POLYMYXIN B SULFATE 500000 UNITS IJ SOLR
INTRAMUSCULAR | Status: DC | PRN
  Administered 2015-02-11: 500 mL

## 2015-02-11 MED ORDER — PROPOFOL 10 MG/ML IV BOLUS
INTRAVENOUS | Status: AC
  Filled 2015-02-11: qty 20

## 2015-02-11 MED ORDER — DEXAMETHASONE SODIUM PHOSPHATE 10 MG/ML IJ SOLN
INTRAMUSCULAR | Status: AC
  Filled 2015-02-11: qty 1

## 2015-02-11 MED ORDER — LABETALOL HCL 5 MG/ML IV SOLN
INTRAVENOUS | Status: AC
  Filled 2015-02-11: qty 4

## 2015-02-11 MED ORDER — FLEET ENEMA 7-19 GM/118ML RE ENEM: 1.0000 | ENEMA | Freq: Once | RECTAL | Status: DC | PRN

## 2015-02-11 MED ORDER — BUPIVACAINE-EPINEPHRINE (PF) 0.5% -1:200000 IJ SOLN
INTRAMUSCULAR | Status: DC | PRN
  Administered 2015-02-11: 20 mL via PERINEURAL

## 2015-02-11 MED ORDER — FUROSEMIDE 20 MG PO TABS: 20.0000 mg | ORAL_TABLET | Freq: Every day | ORAL | Status: DC

## 2015-02-11 MED ORDER — FUROSEMIDE 20 MG PO TABS
20.0000 mg | ORAL_TABLET | Freq: Every day | ORAL | Status: DC
  Administered 2015-02-11 – 2015-02-14 (×4): 20 mg via ORAL
  Filled 2015-02-11 (×4): qty 1

## 2015-02-11 MED ORDER — CEFAZOLIN SODIUM 1-5 GM-% IV SOLN
1.0000 g | Freq: Four times a day (QID) | INTRAVENOUS | Status: AC
  Administered 2015-02-11 (×2): 1 g via INTRAVENOUS
  Filled 2015-02-11 (×2): qty 50

## 2015-02-11 MED ORDER — METOCLOPRAMIDE HCL 5 MG/ML IJ SOLN
INTRAMUSCULAR | Status: DC | PRN
  Administered 2015-02-11: 10 mg via INTRAVENOUS

## 2015-02-11 MED ORDER — BISACODYL 5 MG PO TBEC: 5.0000 mg | DELAYED_RELEASE_TABLET | Freq: Every day | ORAL | Status: DC | PRN

## 2015-02-11 MED ORDER — LIDOCAINE HCL (CARDIAC) 20 MG/ML IV SOLN
INTRAVENOUS | Status: DC | PRN
  Administered 2015-02-11: 40 mg via INTRAVENOUS

## 2015-02-11 MED ORDER — RIVAROXABAN 10 MG PO TABS
10.0000 mg | ORAL_TABLET | Freq: Every day | ORAL | Status: DC
  Administered 2015-02-12 – 2015-02-14 (×3): 10 mg via ORAL
  Filled 2015-02-11 (×4): qty 1

## 2015-02-11 MED ORDER — CHLORHEXIDINE GLUCONATE 4 % EX LIQD: 60.0000 mL | Freq: Once | CUTANEOUS | Status: DC

## 2015-02-11 MED ORDER — OXYCODONE HCL 5 MG PO TABS: 5.0000 mg | ORAL_TABLET | Freq: Once | ORAL | Status: DC | PRN

## 2015-02-11 MED ORDER — PROPOFOL 10 MG/ML IV BOLUS
INTRAVENOUS | Status: DC | PRN
  Administered 2015-02-11: 90 mg via INTRAVENOUS

## 2015-02-11 MED ORDER — FENTANYL CITRATE (PF) 100 MCG/2ML IJ SOLN
INTRAMUSCULAR | Status: DC | PRN
  Administered 2015-02-11 (×5): 50 ug via INTRAVENOUS

## 2015-02-11 MED ORDER — METOCLOPRAMIDE HCL 5 MG/ML IJ SOLN
INTRAMUSCULAR | Status: AC
  Filled 2015-02-11: qty 2

## 2015-02-11 MED ORDER — POLYETHYLENE GLYCOL 3350 17 G PO PACK: 17.0000 g | PACK | Freq: Every day | ORAL | Status: DC | PRN

## 2015-02-11 MED ORDER — ONDANSETRON HCL 4 MG/2ML IJ SOLN
INTRAMUSCULAR | Status: AC
  Filled 2015-02-11: qty 2

## 2015-02-11 MED ORDER — METOPROLOL TARTRATE 100 MG PO TABS
100.0000 mg | ORAL_TABLET | Freq: Two times a day (BID) | ORAL | Status: DC
  Administered 2015-02-11 – 2015-02-14 (×5): 100 mg via ORAL
  Filled 2015-02-11 (×7): qty 1

## 2015-02-11 MED ORDER — SODIUM CHLORIDE 0.9 % IJ SOLN
INTRAMUSCULAR | Status: DC | PRN
  Administered 2015-02-11: 20 mL

## 2015-02-11 MED ORDER — ACETAMINOPHEN 325 MG PO TABS
650.0000 mg | ORAL_TABLET | Freq: Four times a day (QID) | ORAL | Status: DC | PRN
Start: 2015-02-11 — End: 2015-02-14

## 2015-02-11 MED ORDER — CEFAZOLIN SODIUM-DEXTROSE 2-3 GM-% IV SOLR
INTRAVENOUS | Status: AC
  Filled 2015-02-11: qty 50

## 2015-02-11 MED ORDER — ATORVASTATIN CALCIUM 20 MG PO TABS
20.0000 mg | ORAL_TABLET | Freq: Every day | ORAL | Status: DC
  Administered 2015-02-11 – 2015-02-13 (×3): 20 mg via ORAL
  Filled 2015-02-11 (×4): qty 1

## 2015-02-11 MED ORDER — FENTANYL CITRATE (PF) 250 MCG/5ML IJ SOLN
INTRAMUSCULAR | Status: AC
  Filled 2015-02-11: qty 25

## 2015-02-11 MED ORDER — ACETAMINOPHEN 650 MG RE SUPP: 650.0000 mg | Freq: Four times a day (QID) | RECTAL | Status: DC | PRN

## 2015-02-11 MED ORDER — DEXAMETHASONE SODIUM PHOSPHATE 10 MG/ML IJ SOLN
INTRAMUSCULAR | Status: DC | PRN
  Administered 2015-02-11: 10 mg via INTRAVENOUS

## 2015-02-11 MED ORDER — MIDAZOLAM HCL 2 MG/2ML IJ SOLN
INTRAMUSCULAR | Status: AC
  Filled 2015-02-11: qty 4

## 2015-02-11 MED ORDER — BUPIVACAINE LIPOSOME 1.3 % IJ SUSP
20.0000 mL | Freq: Once | INTRAMUSCULAR | Status: DC
  Filled 2015-02-11: qty 20

## 2015-02-11 MED ORDER — BUPIVACAINE LIPOSOME 1.3 % IJ SUSP
INTRAMUSCULAR | Status: DC | PRN
Start: 2015-02-11 — End: 2015-02-11
  Administered 2015-02-11: 40 mL

## 2015-02-11 MED ORDER — LATANOPROST 0.005 % OP SOLN
1.0000 [drp] | Freq: Every day | OPHTHALMIC | Status: DC
  Administered 2015-02-11 – 2015-02-12 (×2): 1 [drp] via OPHTHALMIC
  Filled 2015-02-11: qty 2.5

## 2015-02-11 MED ORDER — SUCCINYLCHOLINE CHLORIDE 20 MG/ML IJ SOLN
INTRAMUSCULAR | Status: DC | PRN
  Administered 2015-02-11: 100 mg via INTRAVENOUS

## 2015-02-11 MED ORDER — MENTHOL 3 MG MT LOZG: 1.0000 | LOZENGE | OROMUCOSAL | Status: DC | PRN

## 2015-02-11 MED ORDER — LACTATED RINGERS IV SOLN: INTRAVENOUS | Status: DC

## 2015-02-11 MED ORDER — MIDAZOLAM HCL 5 MG/5ML IJ SOLN
INTRAMUSCULAR | Status: DC | PRN
  Administered 2015-02-11: 1 mg via INTRAVENOUS

## 2015-02-11 MED ORDER — HYDROMORPHONE HCL 1 MG/ML IJ SOLN: 0.5000 mg | INTRAMUSCULAR | Status: DC | PRN

## 2015-02-11 MED ORDER — BUPIVACAINE HCL (PF) 0.25 % IJ SOLN
INTRAMUSCULAR | Status: AC
  Filled 2015-02-11: qty 30

## 2015-02-11 MED ORDER — THROMBIN 5000 UNITS EX SOLR
CUTANEOUS | Status: AC
Start: 2015-02-11 — End: 2015-02-11
  Filled 2015-02-11: qty 5000

## 2015-02-11 MED ORDER — OXYCODONE-ACETAMINOPHEN 5-325 MG PO TABS: 2.0000 | ORAL_TABLET | ORAL | Status: DC | PRN

## 2015-02-11 MED ORDER — HYDROCODONE-ACETAMINOPHEN 5-325 MG PO TABS
1.0000 | ORAL_TABLET | ORAL | Status: DC | PRN
  Administered 2015-02-11 (×2): 1 via ORAL
  Administered 2015-02-12 (×2): 2 via ORAL
  Administered 2015-02-12 – 2015-02-14 (×4): 1 via ORAL
  Filled 2015-02-11 (×2): qty 2
  Filled 2015-02-11 (×6): qty 1

## 2015-02-11 SURGICAL SUPPLY — 74 items
BAG DECANTER FOR FLEXI CONT (MISCELLANEOUS) IMPLANT
BAG SPEC THK2 15X12 ZIP CLS (MISCELLANEOUS)
BAG ZIPLOCK 12X15 (MISCELLANEOUS) IMPLANT
BANDAGE ELASTIC 4 VELCRO ST LF (GAUZE/BANDAGES/DRESSINGS) ×2 IMPLANT
BANDAGE ELASTIC 6 VELCRO ST LF (GAUZE/BANDAGES/DRESSINGS) ×2 IMPLANT
BANDAGE ESMARK 6X9 LF (GAUZE/BANDAGES/DRESSINGS) ×1 IMPLANT
BLADE SAG 18X100X1.27 (BLADE) ×2 IMPLANT
BLADE SAW SGTL 11.0X1.19X90.0M (BLADE) ×2 IMPLANT
BNDG CMPR 9X6 STRL LF SNTH (GAUZE/BANDAGES/DRESSINGS)
BNDG ESMARK 6X9 LF (GAUZE/BANDAGES/DRESSINGS)
BONE CEMENT GENTAMICIN (Cement) ×4 IMPLANT
CAP KNEE TOTAL 3 SIGMA ×1 IMPLANT
CEMENT BONE GENTAMICIN 40 (Cement) ×2 IMPLANT
CUFF TOURN SGL QUICK 34 (TOURNIQUET CUFF) ×2
CUFF TRNQT CYL 34X4X40X1 (TOURNIQUET CUFF) ×1 IMPLANT
DECANTER SPIKE VIAL GLASS SM (MISCELLANEOUS) ×2 IMPLANT
DRAPE EXTREMITY T 121X128X90 (DRAPE) ×1 IMPLANT
DRAPE INCISE IOBAN 66X45 STRL (DRAPES) IMPLANT
DRAPE POUCH INSTRU U-SHP 10X18 (DRAPES) ×1 IMPLANT
DRAPE SHEET LG 3/4 BI-LAMINATE (DRAPES) ×1 IMPLANT
DRAPE U-SHAPE 47X51 STRL (DRAPES) ×2 IMPLANT
DRSG AQUACEL AG ADV 3.5X10 (GAUZE/BANDAGES/DRESSINGS) ×2 IMPLANT
DRSG TEGADERM 4X4.75 (GAUZE/BANDAGES/DRESSINGS) ×2 IMPLANT
DURAPREP 26ML APPLICATOR (WOUND CARE) ×2 IMPLANT
ELECT REM PT RETURN 9FT ADLT (ELECTROSURGICAL) ×2
ELECTRODE REM PT RTRN 9FT ADLT (ELECTROSURGICAL) ×1 IMPLANT
EVACUATOR 1/8 PVC DRAIN (DRAIN) IMPLANT
FACESHIELD WRAPAROUND (MASK) ×4 IMPLANT
FACESHIELD WRAPAROUND OR TEAM (MASK) ×5 IMPLANT
GAUZE SPONGE 2X2 8PLY STRL LF (GAUZE/BANDAGES/DRESSINGS) ×1 IMPLANT
GLOVE BIOGEL PI IND STRL 6.5 (GLOVE) ×1 IMPLANT
GLOVE BIOGEL PI IND STRL 8 (GLOVE) ×1 IMPLANT
GLOVE BIOGEL PI INDICATOR 6.5 (GLOVE) ×8
GLOVE BIOGEL PI INDICATOR 8 (GLOVE) ×1
GLOVE ECLIPSE 8.0 STRL XLNG CF (GLOVE) ×4 IMPLANT
GLOVE SURG SS PI 6.5 STRL IVOR (GLOVE) ×3 IMPLANT
GOWN STRL REUS W/TWL LRG LVL3 (GOWN DISPOSABLE) ×8 IMPLANT
GOWN STRL REUS W/TWL XL LVL3 (GOWN DISPOSABLE) ×2 IMPLANT
HANDPIECE INTERPULSE COAX TIP (DISPOSABLE) ×2
IMMOBILIZER KNEE 20 (SOFTGOODS) ×2
IMMOBILIZER KNEE 20 THIGH 36 (SOFTGOODS) ×1 IMPLANT
KIT BASIN OR (CUSTOM PROCEDURE TRAY) ×1 IMPLANT
LIQUID BAND (GAUZE/BANDAGES/DRESSINGS) IMPLANT
MANIFOLD NEPTUNE II (INSTRUMENTS) ×2 IMPLANT
NDL SAFETY ECLIPSE 18X1.5 (NEEDLE) ×2 IMPLANT
NEEDLE HYPO 18GX1.5 SHARP (NEEDLE) ×4
NEEDLE HYPO 22GX1.5 SAFETY (NEEDLE) ×2 IMPLANT
NS IRRIG 1000ML POUR BTL (IV SOLUTION) IMPLANT
PACK TOTAL JOINT (CUSTOM PROCEDURE TRAY) ×2 IMPLANT
PEN SKIN MARKING BROAD (MISCELLANEOUS) IMPLANT
POSITIONER SURGICAL ARM (MISCELLANEOUS) ×2 IMPLANT
SET HNDPC FAN SPRY TIP SCT (DISPOSABLE) ×1 IMPLANT
SET PAD KNEE POSITIONER (MISCELLANEOUS) ×2 IMPLANT
SPONGE GAUZE 2X2 STER 10/PKG (GAUZE/BANDAGES/DRESSINGS) ×1
SPONGE LAP 18X18 X RAY DECT (DISPOSABLE) IMPLANT
SPONGE SURGIFOAM ABS GEL 100 (HEMOSTASIS) ×2 IMPLANT
SUCTION FRAZIER 12FR DISP (SUCTIONS) ×1 IMPLANT
SUT BONE WAX W31G (SUTURE) ×2 IMPLANT
SUT MNCRL AB 4-0 PS2 18 (SUTURE) ×2 IMPLANT
SUT VIC AB 1 CT1 27 (SUTURE) ×4
SUT VIC AB 1 CT1 27XBRD ANTBC (SUTURE) ×2 IMPLANT
SUT VIC AB 2-0 CT1 27 (SUTURE) ×6
SUT VIC AB 2-0 CT1 TAPERPNT 27 (SUTURE) ×3 IMPLANT
SUT VLOC 180 0 24IN GS25 (SUTURE) ×2 IMPLANT
SYR 20CC LL (SYRINGE) ×2 IMPLANT
SYR 50ML LL SCALE MARK (SYRINGE) ×2 IMPLANT
TOWEL OR 17X26 10 PK STRL BLUE (TOWEL DISPOSABLE) ×2 IMPLANT
TOWEL OR NON WOVEN STRL DISP B (DISPOSABLE) IMPLANT
TOWER CARTRIDGE SMART MIX (DISPOSABLE) ×2 IMPLANT
TRAY FOLEY W/METER SILVER 14FR (SET/KITS/TRAYS/PACK) ×2 IMPLANT
TRAY FOLEY W/METER SILVER 16FR (SET/KITS/TRAYS/PACK) IMPLANT
WATER STERILE IRR 1500ML POUR (IV SOLUTION) ×2 IMPLANT
WRAP KNEE MAXI GEL POST OP (GAUZE/BANDAGES/DRESSINGS) ×2 IMPLANT
YANKAUER SUCT BULB TIP 10FT TU (MISCELLANEOUS) ×1 IMPLANT

## 2015-02-11 NOTE — Anesthesia Postprocedure Evaluation (Signed)
Anesthesia Post Note  Patient: Kristin Wilkerson  Procedure(s) Performed: Procedure(s) (LRB): TOTAL KNEE ARTHROPLASTY (Right)  Anesthesia type: General  Patient location: PACU  Post pain: Pain level controlled and Adequate analgesia  Post assessment: Post-op Vital signs reviewed, Patient's Cardiovascular Status Stable, Respiratory Function Stable, Patent Airway and Pain level controlled  Last Vitals:  Filed Vitals:   02/11/15 1030  BP: 166/83  Pulse: 89  Temp:   Resp: 19    Post vital signs: Reviewed and stable  Level of consciousness: awake, alert  and oriented  Complications: No apparent anesthesia complications

## 2015-02-11 NOTE — Plan of Care (Signed)
Problem: Consults Goal: Diagnosis- Total Joint Replacement Primary Total Knee     

## 2015-02-11 NOTE — Transfer of Care (Signed)
Immediate Anesthesia Transfer of Care Note  Patient: Kristin BullocksAnn T Martel  Procedure(s) Performed: Procedure(s): TOTAL KNEE ARTHROPLASTY (Right)  Patient Location: PACU  Anesthesia Type:GA combined with regional for post-op pain  Level of Consciousness: awake, sedated and responds to stimulation  Airway & Oxygen Therapy: Patient Spontanous Breathing and Patient connected to face mask oxygen  Post-op Assessment: Report given to RN and Post -op Vital signs reviewed and stable  Post vital signs: Reviewed and stable  Last Vitals:  Filed Vitals:   02/11/15 0718  BP:   Pulse: 58  Temp:   Resp: 18    Complications: No apparent anesthesia complications

## 2015-02-11 NOTE — Clinical Social Work Note (Signed)
Clinical Social Work Assessment  Patient Details  Name: Kristin Wilkerson MRN: [id number] Date of Birth: 09/20/1925  Date of referral:  02/11/15               Reason for consult:  Discharge Planning, Facility Placement                Permission sought to share information with:  [facility] Contact Representative Permission granted to share information::  Yes, Verbal Permission Granted  Name::        Agency::     Relationship::     Contact Information:     Housing/Transportation Living arrangements for the past 2 months:  Single Family Home Source of Information:  Patient, Adult Children Patient Interpreter Needed:  None Criminal Activity/Legal Involvement Pertinent to Current Situation/Hospitalization:  No - Comment as needed Significant Relationships:  Adult Children Lives with:  Self Do you feel safe going back to the place where you live?   (ST Rehab needed.) Need for family participation in patient care:  Yes (Comment)  Care giving concerns:  Pt's care cannot be managed at home following hospital d/c.    Social Worker assessment / plan:  Pt hospitalized on 02/11/15 for pre planned right total knee arthroplasty. MD has recommended ST Rehab at d/c. Pt has made prior arrangements to [facility] at [facility]. CSW has contacted SNF and d/c plans have been confirmed.  CSW will continue to follow to assist with d/c planning to SNF.  Employment status:  Retired Health and safety inspectornsurance information:  Medicare PT Recommendations:  Skilled Nursing Facility Information / Referral to community resources:  Skilled Nursing Facility  Patient/Family's Response to care:  Pt / family feel ST Rehab is needed.  Patient/Family's Understanding of and Emotional Response to Diagnosis, Current Treatment, and Prognosis:  Pt / family are aware of pt's medical status. Pt is sleepy, just having returned from surgery. Family reports that pt was feeling a little anxious regarding her d/c plans. CSW reassured pt /  family that [facility] is able to offer [facility].  Emotional Assessment Appearance:  Appears stated age Attitude/Demeanor/Rapport:  Other (cooperative) Affect (typically observed):  Anxious Orientation:  Oriented to Self, Oriented to Place, Oriented to  Time, Oriented to Situation Alcohol / Substance use:  Not Applicable Psych involvement (Current and /or in the community):  No (Comment)  Discharge Needs  Concerns to be addressed:  Discharge Planning Concerns Readmission within the last 30 days:  No Current discharge risk:  None Barriers to Discharge:  No Barriers Identified   Vennie HomansHaidinger, Edgardo Petrenko Lee, LCSW  [phone] 02/11/2015, 3:40 PM

## 2015-02-11 NOTE — Clinical Social Work Placement (Signed)
   CLINICAL SOCIAL WORK PLACEMENT  NOTE  Date:  02/11/2015  Patient Details  Name: Kristin Wilkerson T Retter MRN: [id number] Date of Birth: 26-Feb-1926  Clinical Social Work is seeking post-discharge placement for this patient at the Skilled  Nursing Facility level of care (*CSW will initial, date and re-position this form in  chart as items are completed):  No   Patient/family provided with [facility] Clinical Social Work Department's list of facilities offering this level of care within the geographic area requested by the patient (or if unable, by the patient's family).  Yes   Patient/family informed of their freedom to choose among providers that offer the needed level of care, that participate in Medicare, Medicaid or managed care program needed by the patient, have an available bed and are willing to accept the patient.  No   Patient/family informed of [place]'s ownership interest in [facility] and [facility], as well as of the fact that they are under no obligation to receive care at these facilities.  PASRR submitted to EDS on 02/11/15     PASRR number received on       Existing PASRR number confirmed on       FL2 transmitted to all facilities in geographic area requested by pt/family on       FL2 transmitted to all facilities within larger geographic area on       Patient informed that his/her managed care company has contracts with or will negotiate with certain facilities, including the following:        Yes   Patient/family informed of bed offers received.  Patient [facility]     Physician recommends and patient chooses [facility]    [facility]  .  Patient to be transferred to facility by       Patient family notified on   of transfer.  Name of family member notified:        PHYSICIAN       Additional Comment:    _______________________________________________ Royetta AsalHaidinger, Enedelia Martorelli Lee, Alexander MtLCSW   [phone] 02/11/2015, 3:47 PM

## 2015-02-11 NOTE — Anesthesia Procedure Notes (Addendum)
Anesthesia Regional Block:  Adductor canal block  Pre-Anesthetic Checklist: ,, timeout performed, Correct Patient, Correct Site, Correct Laterality, Correct Procedure, Correct Position, site marked, Risks and benefits discussed,  Surgical consent,  Pre-op evaluation,  At surgeon's request and post-op pain management  Laterality: Right  Prep: chloraprep       Needles:  Injection technique: Single-shot  Needle Type: Echogenic Needle     Needle Length: 9cm 9 cm Needle Gauge: 21 and 21 G    Additional Needles:  Procedures: ultrasound guided (picture in chart) Adductor canal block Narrative:  Start time: 02/11/2015 7:02 AM End time: 02/11/2015 7:11 AM Injection made incrementally with aspirations every 5 mL.  Performed by: Personally  Anesthesiologist: HODIERNE, ADAM  Additional Notes: Pt tolerated the procedure well.   Procedure Name: Intubation Date/Time: 02/11/2015 7:29 AM Performed by: Davari Lopes, Nuala AlphaKRISTOPHER Pre-anesthesia Checklist: Patient identified, Emergency Drugs available, Suction available, Patient being monitored and Timeout performed Patient Re-evaluated:Patient Re-evaluated prior to inductionOxygen Delivery Method: Circle system utilized Preoxygenation: Pre-oxygenation with 100% oxygen Intubation Type: IV induction Ventilation: Mask ventilation without difficulty Laryngoscope Size: Mac and 4 Grade View: Grade III Tube type: Oral Tube size: 7.5 mm Number of attempts: 1 Airway Equipment and Method: Stylet Placement Confirmation: ETT inserted through vocal cords under direct vision,  positive ETCO2,  CO2 detector and breath sounds checked- equal and bilateral Secured at: 21 cm Tube secured with: Tape Dental Injury: Teeth and Oropharynx as per pre-operative assessment  Difficulty Due To: Difficult Airway- due to anterior larynx and Difficult Airway- due to dentition Future Recommendations: Recommend- induction with short-acting agent, and alternative techniques  readily available

## 2015-02-11 NOTE — Progress Notes (Signed)
Utilization review completed.  

## 2015-02-11 NOTE — Care Management Note (Signed)
Case Management Note  Patient Details  Name: Kristin Bullocksnn T Wilkerson MRN: [id number] Date of Birth: [birth year]  Subjective/Objective:                   TOTAL KNEE ARTHROPLASTY (Right) Action/Plan: Discharge planning  Expected Discharge Date:                  Expected Discharge Plan:  Skilled Nursing Facility  In-House Referral:     Discharge planning Services  CM Consult  Post Acute Care Choice:    Choice offered to:     DME Arranged:    DME Agency:     HH Arranged:    HH Agency:     Status of Service:  Completed, signed off  Medicare Important Message Given:    Date Medicare IM Given:    Medicare IM give by:    Date Additional Medicare IM Given:    Additional Medicare Important Message give by:     If discussed at Long Length of Stay Meetings, dates discussed:    Additional Comments: CM notes plan for SNF [facility] CSW aware and arranging.  No other CM needs communicated. Yves DillJeffries, Timberlee Roblero Christine, RN 02/11/2015, 2:10 PM

## 2015-02-11 NOTE — NC FL2 (Signed)
[place] MEDICAID FL2 LEVEL OF CARE SCREENING TOOL     IDENTIFICATION  Patient Name: Kristin Bullocksnn T Tal Birthdate: 05/05/1925 Sex: female Admission Date (Current Location): 02/11/2015  [facility] [place]     Facility and Address:  [facility],  [address], [place] [id number]      Provider Number: [id number]  Attending Physician Name and Address:  Ranee Gosselinonald Gioffre, MD  Relative Name and Phone Number:       Current Level of Care: Hospital Recommended Level of Care: Skilled Nursing Facility Prior Approval Number:    Date Approved/Denied:   PASRR Number:    Discharge Plan: SNF    Current Diagnoses: Patient Active Problem List   Diagnosis Date Noted  . History of total knee arthroplasty 02/11/2015  . 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 Monilial vulvovaginitis 09/24/2014  . Cough 09/24/2014  . Tracheobronchitis 08/06/2014  . Deaf 07/02/2014  . Right knee pain 07/02/2014  . History of shingles 2015 02/14/2014  . Depression, major, recurrent, moderate (HCC) 01/07/2014  . Grief 11/27/2013  . Left arm pain 11/06/2013  . Corn of foot 10/23/2013  . Leg edema 09/03/2013  . Insomnia 07/02/2013  . Diastolic CHF (HCC) 03/02/2013  . Pulmonary hypertension (HCC) 03/02/2013  . History of CVA (cerebrovascular accident) 03/01/2013  . HTN (hypertension) 03/01/2013  . HLD (hyperlipidemia) 03/01/2013  . Tachycardia 12/10/2012  . Anxiety 12/10/2012  . Myalgia and myositis, unspecified 09/18/2012  . Other malaise and fatigue 09/18/2012  . Backache 03/31/2008    Orientation ACTIVITIES/SOCIAL BLADDER RESPIRATION  Place (oriented x 4) Passive Continent Normal  BEHAVIORAL SYMPTOMS/MOOD NEUROLOGICAL BOWEL NUTRITION STATUS   (no behaviors)   Continent Diet  PHYSICIAN VISITS COMMUNICATION OF NEEDS Height & Weight Skin    Verbally 5' (152.4 cm) 166 lbs. Surgical wounds          AMBULATORY STATUS RESPIRATION    Assist extensive Normal      Personal Care  Assistance Level of Assistance  Bathing, Dressing Bathing Assistance: Limited assistance   Dressing Assistance: Limited assistance      Functional Limitations Info                SPECIAL CARE FACTORS FREQUENCY  PT (By licensed PT) (OT eval)     PT Frequency: 6-7 x weekly             Additional Factors Info  Code Status Code Status Info: Full Code             Current Medications (02/11/2015): Current Facility-Administered Medications  Medication Dose Route Frequency Provider Last Rate Last Dose  . acetaminophen (TYLENOL) tablet 650 mg  650 mg Oral Q6H PRN Ranee Gosselinonald Gioffre, MD       Or  . acetaminophen (TYLENOL) suppository 650 mg  650 mg Rectal Q6H PRN Ranee Gosselinonald Gioffre, MD      . alum & mag hydroxide-simeth (MAALOX/MYLANTA) 200-200-20 MG/5ML suspension 30 mL  30 mL Oral Q4H PRN Ranee Gosselinonald Gioffre, MD      . atorvastatin (LIPITOR) tablet 20 mg  20 mg Oral q1800 Ranee Gosselinonald Gioffre, MD      . bisacodyl (DULCOLAX) EC tablet 5 mg  5 mg Oral Daily PRN Ranee Gosselinonald Gioffre, MD      . ceFAZolin (ANCEF) IVPB 1 g/50 mL premix  1 g Intravenous Q6H Ranee Gosselinonald Gioffre, MD   1 g at 02/11/15 1408  . DULoxetine (CYMBALTA) DR capsule 30 mg  30 mg Oral Daily Ranee Gosselinonald Gioffre, MD   30 mg at 02/11/15  1407  . ferrous sulfate tablet 325 mg  325 mg Oral TID PC Ranee Gosselin, MD      . furosemide (LASIX) tablet 20 mg  20 mg Oral Daily Ranee Gosselin, MD   20 mg at 02/11/15 1408  . HYDROcodone-acetaminophen (NORCO/VICODIN) 5-325 MG per tablet 1-2 tablet  1-2 tablet Oral Q4H PRN Ranee Gosselin, MD   1 tablet at 02/11/15 1407  . HYDROmorphone (DILAUDID) injection 0.5 mg  0.5 mg Intravenous Q2H PRN Ranee Gosselin, MD      . labetalol (NORMODYNE,TRANDATE) 5 MG/ML injection           . lactated ringers infusion   Intravenous Continuous Ranee Gosselin, MD      . latanoprost (XALATAN) 0.005 % ophthalmic solution 1 drop  1 drop Both Eyes QHS Ranee Gosselin, MD      . losartan (COZAAR) tablet 100 mg  100 mg Oral Daily  Ranee Gosselin, MD   100 mg at 02/11/15 1407  . menthol-cetylpyridinium (CEPACOL) lozenge 3 mg  1 lozenge Oral PRN Ranee Gosselin, MD       Or  . phenol (CHLORASEPTIC) mouth spray 1 spray  1 spray Mouth/Throat PRN Ranee Gosselin, MD      . metoprolol (LOPRESSOR) tablet 100 mg  100 mg Oral BID Ranee Gosselin, MD      . ondansetron [facility]) tablet 4 mg  4 mg Oral Q6H PRN Ranee Gosselin, MD       Or  . ondansetron [facility]) injection 4 mg  4 mg Intravenous Q6H PRN Ranee Gosselin, MD      . oxyCODONE-acetaminophen (PERCOCET/ROXICET) 5-325 MG per tablet 2 tablet  2 tablet Oral Q4H PRN Ranee Gosselin, MD      . polyethylene glycol (MIRALAX / GLYCOLAX) packet 17 g  17 g Oral Daily PRN Ranee Gosselin, MD      . Melene Muller ON 02/12/2015] rivaroxaban (XARELTO) tablet 10 mg  10 mg Oral Q breakfast Ranee Gosselin, MD      . sodium phosphate (FLEET) 7-19 GM/118ML enema 1 enema  1 enema Rectal Once PRN Ranee Gosselin, MD       Do not use this list as official medication orders. Please verify with discharge summary.  Discharge Medications:   Medication List    ASK your doctor about these medications        acetaminophen 500 MG tablet  Commonly known as:  TYLENOL  Take 1,000 mg by mouth daily as needed for moderate pain.     atorvastatin 20 MG tablet  Commonly known as:  LIPITOR  TAKE 1 TABLET BY MOUTH EVERY EVENING TO CONTROL CHOLESTEROL     Calcium Carbonate-Vitamin D 600-400 MG-UNIT tablet  Commonly known as:  CALCIUM 600+D  Take one tablet by mouth daily for calcium supplement     cetirizine 10 MG tablet  Commonly known as:  ZYRTEC  Take 1 tablet by mouth daily.     cholecalciferol 1000 UNITS tablet  Commonly known as:  VITAMIN D  Take 1,000 Units by mouth daily.     clopidogrel 75 MG tablet  Commonly known as:  PLAVIX  TAKE 1 TABLET BY MOUTH EVERY MORNING FOR ANTICOAGULATION     clotrimazole 1 % cream  Commonly known as:  LOTRIMIN  Apply twice daily to rash until resolved      DULoxetine 30 MG capsule  Commonly known as:  CYMBALTA  One daily to help nerves     furosemide 20 MG tablet  Commonly known as:  LASIX  Take one every other day for diuretic     furosemide 20 MG tablet  Commonly known as:  LASIX  TAKE 1 TABLET BY MOUTH EVERY DAY     latanoprost 0.005 % ophthalmic solution  Commonly known as:  XALATAN  INSTILL 1 DROP TO BOTH EYES AT BEDTIME TO TREAT GLAUCOMA     losartan 100 MG tablet  Commonly known as:  COZAAR  TAKE 1 TABLET BY MOUTH ONCE DAILY FOR BLOOD PRESSURE     metoprolol 100 MG tablet  Commonly known as:  LOPRESSOR  TAKE 1 TABLET BY MOUTH TWICE DAILY TO CONTORL BLOOD PRESSURE        Relevant Imaging Results:  Relevant Lab Results:  Recent Labs    Additional Information    Kristin Wilkerson, Kristin Gave, LCSW

## 2015-02-11 NOTE — Brief Op Note (Signed)
02/11/2015  8:59 AM  PATIENT:  Kristin BullocksAnn T Wilkerson  81 y.o. female  PRE-OPERATIVE DIAGNOSIS:Primary  OA RIGHT KNEE  POST-OPERATIVE DIAGNOSIS:  right knee Primary OA  PROCEDURE:  Procedure(s): TOTAL KNEE ARTHROPLASTY (Right)  SURGEON:  Surgeon(s) and Role:    * Ranee Gosselinonald Nataliyah Packham, MD - Primary  PHYSICIAN ASSISTANT: Dimitri PedAmber Constable PA  ASSISTANTS: Dimitri PedAmber Constable PA  ANESTHESIA:   general  EBL:  Total I/O In: 1000 [I.V.:1000] Out: 100 [Urine:50; Blood:50]  BLOOD ADMINISTERED:none  DRAINS: (One) Hemovact drain(s) in the Right Knee with  Suction Open   LOCAL MEDICATIONS USED:  MARCAINE  20cc of 0.25% Plain and 20cc of Exparel mixed with 20cc of Normal Saline   SPECIMEN:  No Specimen  DISPOSITION OF SPECIMEN:  N/A  COUNTS:  YES  TOURNIQUET:  * Missing tourniquet times found for documented tourniquets in log:  [id number] *  DICTATION: .Other Dictation: Dictation Number [id number]  PLAN OF CARE: Admit to inpatient   PATIENT DISPOSITION:  Stable in OR   Delay start of Pharmacological VTE agent (>24hrs) due to surgical blood loss or risk of bleeding: yes

## 2015-02-11 NOTE — Progress Notes (Signed)
AssistedDr. Chaney MallingHodierne with right adductor canal block. Side rails up, monitors on throughout procedure. See vital signs in flow sheet. Tolerated Procedure well.

## 2015-02-11 NOTE — Anesthesia Preprocedure Evaluation (Signed)
Anesthesia Evaluation  Patient identified by MRN, date of birth, ID band Patient awake    Reviewed: Allergy & Precautions, NPO status , Patient's Chart, lab work & pertinent test results  Airway Mallampati: II   Neck ROM: full    Dental   Pulmonary    breath sounds clear to auscultation       Cardiovascular hypertension,  Rhythm:regular Rate:Normal     Neuro/Psych PSYCHIATRIC DISORDERS Anxiety Depression  Neuromuscular disease CVA, Residual Symptoms    GI/Hepatic   Endo/Other  obese  Renal/GU      Musculoskeletal  (+) Arthritis ,   Abdominal   Peds  Hematology   Anesthesia Other Findings   Reproductive/Obstetrics                             Anesthesia Physical Anesthesia Plan  ASA: III  Anesthesia Plan: General   Post-op Pain Management:    Induction: Intravenous  Airway Management Planned: Oral ETT  Additional Equipment:   Intra-op Plan:   Post-operative Plan: Extubation in OR  Informed Consent: I have reviewed the patients History and Physical, chart, labs and discussed the procedure including the risks, benefits and alternatives for the proposed anesthesia with the patient or authorized representative who has indicated his/her understanding and acceptance.     Plan Discussed with: CRNA, Anesthesiologist and Surgeon  Anesthesia Plan Comments:         Anesthesia Quick Evaluation

## 2015-02-11 NOTE — Interval H&P Note (Signed)
History and Physical Interval Note:  02/11/2015 7:03 AM  Carey BullocksAnn T Veldman  has presented today for surgery, with the diagnosis of OA RIGHT KNEE  The various methods of treatment have been discussed with the patient and family. After consideration of risks, benefits and other options for treatment, the patient has consented to  Procedure(s): TOTAL KNEE ARTHROPLASTY (Right) as a surgical intervention .  The patient's history has been reviewed, patient examined, no change in status, stable for surgery.  I have reviewed the patient's chart and labs.  Questions were answered to the patient's satisfaction.     [place]esto Ganoe A

## 2015-02-12 LAB — CBC
HCT: 34.2 % — ABNORMAL LOW (ref 36.0–46.0)
Hemoglobin: 11.1 g/dL — ABNORMAL LOW (ref 12.0–15.0)
MCH: 30.3 pg (ref 26.0–34.0)
MCHC: 32.5 g/dL (ref 30.0–36.0)
MCV: 93.4 fL (ref 78.0–100.0)
PLATELETS: 206 10*3/uL (ref 150–400)
RBC: 3.66 MIL/uL — ABNORMAL LOW (ref 3.87–5.11)
RDW: 13.8 % (ref 11.5–15.5)
WBC: 12.9 10*3/uL — AB (ref 4.0–10.5)

## 2015-02-12 LAB — BASIC METABOLIC PANEL
ANION GAP: 8 (ref 5–15)
BUN: 14 mg/dL (ref 6–20)
CALCIUM: 8.8 mg/dL — AB (ref 8.9–10.3)
CO2: 26 mmol/L (ref 22–32)
Chloride: 102 mmol/L (ref 101–111)
Creatinine, Ser: 0.78 mg/dL (ref 0.44–1.00)
GLUCOSE: 140 mg/dL — AB (ref 65–99)
POTASSIUM: 4.1 mmol/L (ref 3.5–5.1)
SODIUM: 136 mmol/L (ref 135–145)

## 2015-02-12 MED ORDER — TRAMADOL HCL 50 MG PO TABS
50.0000 mg | ORAL_TABLET | Freq: Four times a day (QID) | ORAL | Status: DC | PRN
  Administered 2015-02-12: 50 mg via ORAL
  Filled 2015-02-12: qty 1

## 2015-02-12 NOTE — Progress Notes (Signed)
Physical Therapy Treatment Patient Details Name: Kristin Wilkerson MRN: [id number] DOB: Jan 26, 1926 Today's Date: 02/12/2015    History of Present Illness Pt s/p R TKR    PT Comments    Pt progressed to therex.  Follow Up Recommendations  SNF     Equipment Recommendations  None recommended by PT    Recommendations for Other Services OT consult     Precautions / Restrictions Precautions Precautions: Knee;Fall Required Braces or Orthoses: Knee Immobilizer - Right Knee Immobilizer - Right: Discontinue once straight leg raise with < 10 degree lag Restrictions Weight Bearing Restrictions: No Other Position/Activity Restrictions: WBAT    Mobility  Bed Mobility Overal bed mobility: Needs Assistance Bed Mobility: Supine to Sit     Supine to sit: Mod assist;+2 for physical assistance;+2 for safety/equipment     General bed mobility comments: cues for sequence and use of L LE to self assist.  Physical assist to manage R LE, to bring trunk to upright and to move to EOB on pad  Transfers Overall transfer level: Needs assistance Equipment used: Rolling walker (2 wheeled) Transfers: Sit to/from Stand Sit to Stand: Mod assist;+2 safety/equipment         General transfer comment: cues for LE management and use of UEs to self assist  Ambulation/Gait Ambulation/Gait assistance: Mod assist;+2 safety/equipment Ambulation Distance (Feet): 14 Feet Assistive device: Rolling walker (2 wheeled) Gait Pattern/deviations: Step-to pattern;Decreased step length - right;Decreased step length - left;Shuffle;Trunk flexed;Decreased stance time - left Gait velocity: decr   General Gait Details: cues for sequence, posture and position from [employer]   Stairs            Wheelchair Mobility    Modified Rankin (Stroke Patients Only)       Balance                                    Cognition Arousal/Alertness: Awake/alert Behavior During Therapy: WFL for tasks  assessed/performed Overall Cognitive Status: Within Functional Limits for tasks assessed                      Exercises Total Joint Exercises Ankle Circles/Pumps: AROM;Both;15 reps;Supine Quad Sets: AROM;Both;10 reps;Supine Heel Slides: AAROM;Left;10 reps;Supine Straight Leg Raises: AAROM;Left;10 reps;Supine Goniometric ROM: AAROM R knee -10 - 40    General Comments        Pertinent Vitals/Pain Pain Assessment: 0-10 Pain Score: 5  Pain Location: R knee Pain Descriptors / Indicators: Aching;Sore Pain Intervention(s): Limited activity within patient's tolerance;Premedicated before session;Monitored during session;Ice applied    Home Living Family/patient expects to be discharged to:: Skilled nursing facility                    Prior Function Level of Independence: Independent;Independent with assistive device(s)          PT Goals (current goals can now be found in the care plan section) Acute Rehab PT Goals Patient Stated Goal[facility] then home with son PT Goal Formulation: With patient Time For Goal Achievement: 02/17/15 Potential to Achieve Goals: Good Progress towards PT goals: Progressing toward goals    Frequency  7X/week    PT Plan Current plan remains appropriate    Co-evaluation             End of Session Equipment Utilized During Treatment: Gait belt;Right knee immobilizer Activity Tolerance: Patient tolerated treatment well Patient left:  in chair;with call bell/phone within reach     Time: 1035-1055 PT Time Calculation (min) (ACUTE ONLY): 20 min  Charges:  $Gait Training: 8-22 mins $Therapeutic Exercise: 8-22 mins                    G Codes:      Kristin Wilkerson February 15, 2015, 12:30 PM

## 2015-02-12 NOTE — Op Note (Signed)
NAMGabriela Wilkerson:  Elbert, Jazae                  ACCOUNT NO.:  [id number]  MEDICAL RECORD NO.:  [id number]  LOCATION:  1619                         [facility]  PHYSICIAN:  Georges Lynchonald A. Breeonna Mone, M.D.DATE OF BIRTH:  1925/09/17  DATE OF PROCEDURE:  02/11/2015 DATE OF DISCHARGE:                              OPERATIVE REPORT   SURGEON:  Georges Lynchonald A. [name], M.D.  OPERATIVE ASSISTANT:  Dimitri PedAmber Constable, PA  PREOPERATIVE DIAGNOSIS:  Primary osteoarthritis with bone-on-bone on genu varus with flexion contracture, right knee.  POSTOPERATIVE DIAGNOSIS:  Primary osteoarthritis with bone-on-bone on genu varus with flexion contracture, right knee.  OPERATION:  Right total knee arthroplasty utilizing DePuy system.  The sizes used was a size 3 right posterior cruciate sacrificing femoral component.  Tibial tray was a size 2.5.  The insert was size 3, 15 mm thickness.  Patella was a size 35 with 3 pegs.  Gentamicin was used in the cement.  DESCRIPTION OF PROCEDURE:  Under general anesthesia, routine orthopedic prep and draping of the right lower extremity was carried out.  The leg was exsanguinated with Esmarch.  Tourniquet was elevated at 350 mmHg. At this time, the appropriate time-out was carried out.  I also marked the appropriate right leg in the holding area.  The knee was placed in a DeMayo knee holder, flexed, and an anterior approach to the knee was carried out.  Two flaps were created.  I then carried out a median parapatellar incision reflecting patella laterally, and I did medial and lateral meniscectomies and excised the anterior, posterior, cruciate ligaments.  At this particular time, I then made my initial drill hole in the intercondylar notch.  The canal finder then was inserted and after removed I thoroughly irrigated out the canal.  We then removed 11 mm thickness off the distal femur utilizing the neck jig.  Following that, we measured the femur as I mentioned to be a size 3 and did  our anterior posterior and chamfering cuts for a size 3 femoral component. After that, we then went down, prepared the tibia, a drill hole was made in the tibial plateau in the usual fashion.  We then removed 4 mm thickness off the affected medial side of the tibia.  After this was done, prior to doing that, we inserted the canal finder and then thoroughly irrigated out the canal.  We then inserted our lamina spreaders and removed the posterior spurs from the femur and then inserted our spacer blocks and felt that the 15 mm was a very nice fit with flexion/extension and medial and lateral stability.  After that, we then went on to prepare the tibia.  We cut our keel cuts out of the tibia.  We did our notch cut out of the distal femur.  We then irrigated the knee out and then inserted our trial components.  Once again, we felt that the 15 mm thickness was the best fit.  We did try 12.5.  We then did a resurfacing procedure on the patella for a 35 patella, 3 drill holes were made on the articular surface of the patella.  All trial components were removed.  We thoroughly water picked out  the knee, dried the knee out, cemented all 3 components in simultaneously with gentamicin in the cement.  At that time, we then removed the trial component, irrigated the knee out, removed all loose pieces of cement. I then water picked the knee out again to make sure we had the cement removed.  We then injected 20 mL of 0.25% plain Marcaine into soft tissue structures.  I then inserted some thrombin-soaked Gelfoam and then my permanent size 3, 15 mm thickness rotating platform polyethylene insert, reduced the knee and had excellent flexion/extension and good medial and lateral stability.  We irrigated the knee out again with antibiotic solution and then inserted a Hemovac drain and closed the knee in layers in the usual fashion.  Sterile dressings were applied.  She received tranexamic acid as well as 2 g  of IV Ancef.          ______________________________ Georges Lynch. Darrelyn Hillock, M.D.     RAG/MEDQ  D:  02/11/2015  T:  02/12/2015  Job:  [id number]

## 2015-02-12 NOTE — Progress Notes (Signed)
Physical Therapy Treatment Patient Details Name: Kristin Wilkerson MRN: [id number] DOB: [birth year] Today's Date: 02/12/2015    History of Present Illness Pt s/p R TKR    PT Comments    Pt ltd this pm by fatigue/pain and having difficulty following cues for safe completion of task.  Follow Up Recommendations  SNF     Equipment Recommendations  None recommended by PT    Recommendations for Other Services OT consult     Precautions / Restrictions Precautions Precautions: Knee;Fall Required Braces or Orthoses: Knee Immobilizer - Right Knee Immobilizer - Right: Discontinue once straight leg raise with < 10 degree lag Restrictions Weight Bearing Restrictions: No Other Position/Activity Restrictions: WBAT    Mobility  Bed Mobility Overal bed mobility: Needs Assistance Bed Mobility: Sit to Supine       Sit to supine: Mod assist;+2 for physical assistance   General bed mobility comments: cues for sequence and use of L LE to self assist.  Physical assist to manage R LE, to control trunk and to move up in bed on pad  Transfers Overall transfer level: Needs assistance Equipment used: Rolling walker (2 wheeled) Transfers: Sit to/from Stand Sit to Stand: Mod assist;+2 safety/equipment         General transfer comment: cues for LE management and use of UEs to self assist  Ambulation/Gait Ambulation/Gait assistance: Mod assist;+2 physical assistance Ambulation Distance (Feet): 6 Feet Assistive device: Rolling walker (2 wheeled) Gait Pattern/deviations: Step-to pattern;Decreased step length - right;Decreased step length - left;Decreased stance time - right;Shuffle;Trunk flexed Gait velocity: decr   General Gait Details: cues for sequence, posture, increased UE WB and position from RW   Stairs            Wheelchair Mobility    Modified Rankin (Stroke Patients Only)       Balance                                    Cognition Arousal/Alertness:  Awake/alert Behavior During Therapy: WFL for tasks assessed/performed Overall Cognitive Status: Within Functional Limits for tasks assessed                      Exercises Total Joint Exercises Ankle Circles/Pumps: AROM;Both;15 reps;Supine Quad Sets: AROM;Both;10 reps;Supine Heel Slides: AAROM;Left;10 reps;Supine Straight Leg Raises: AAROM;Left;10 reps;Supine Goniometric ROM: AAROM R knee -10 - 40    General Comments        Pertinent Vitals/Pain Pain Assessment: Faces Pain Score: 5  Faces Pain Scale: Hurts even more Pain Location: R knee Pain Descriptors / Indicators: Aching;Sore Pain Intervention(s): Limited activity within patient's tolerance;Monitored during session;Premedicated before session    Home Living                      Prior Function            PT Goals (current goals can now be found in the care plan section) Acute Rehab PT Goals Patient Stated Goal: Rehab at [facility] and then home with son PT Goal Formulation: With patient Time For Goal Achievement: 02/17/15 Potential to Achieve Goals: Good Progress towards PT goals: Progressing toward goals    Frequency  7X/week    PT Plan Current plan remains appropriate    Co-evaluation             End of Session Equipment Utilized During Treatment: Gait belt;Right knee immobilizer Activity Tolerance:  Patient limited by fatigue;Patient limited by pain Patient left: in bed;with call bell/phone within reach     Time: 1348-1403 PT Time Calculation (min) (ACUTE ONLY): 15 min  Charges:  $Gait Training: 8-22 mins $Therapeutic Exercise: 8-22 mins                    G Codes:      Kristin Wilkerson 02/12/2015, 2:20 PM

## 2015-02-12 NOTE — Evaluation (Signed)
Physical Therapy Evaluation Patient Details Name: Kristin Wilkerson MRN: [id number] DOB: 1925-11-06 Today's Date: 02/12/2015   History of Present Illness  Pt s/p R TKR  Clinical Impression  Pt s/p R TKR presents with decreased R LE strength/ROM and post op pain limiting functional mobility.  Pt would benefit from follow up rehab at SNF level to maximize IND and safety prior to return home with ltd assist.    Follow Up Recommendations SNF    Equipment Recommendations  None recommended by PT    Recommendations for Other Services OT consult     Precautions / Restrictions Precautions Precautions: Knee;Fall Required Braces or Orthoses: Knee Immobilizer - Right Knee Immobilizer - Right: Discontinue once straight leg raise with < 10 degree lag Restrictions Weight Bearing Restrictions: No Other Position/Activity Restrictions: WBAT      Mobility  Bed Mobility Overal bed mobility: Needs Assistance Bed Mobility: Supine to Sit     Supine to sit: Mod assist;+2 for physical assistance;+2 for safety/equipment     General bed mobility comments: cues for sequence and use of L LE to self assist.  Physical assist to manage R LE, to bring trunk to upright and to move to EOB on pad  Transfers Overall transfer level: Needs assistance Equipment used: Rolling walker (2 wheeled) Transfers: Sit to/from Stand Sit to Stand: Mod assist;+2 safety/equipment         General transfer comment: cues for LE management and use of UEs to self assist  Ambulation/Gait Ambulation/Gait assistance: Mod assist;+2 safety/equipment Ambulation Distance (Feet): 14 Feet Assistive device: Rolling walker (2 wheeled) Gait Pattern/deviations: Step-to pattern;Decreased step length - right;Decreased step length - left;Shuffle;Trunk flexed;Decreased stance time - left Gait velocity: decr   General Gait Details: cues for sequence, posture and position from [employer]            Wheelchair Mobility     Modified Rankin (Stroke Patients Only)       Balance                                             Pertinent Vitals/Pain Pain Assessment: 0-10 Pain Score: 6  Pain Location: R knee Pain Descriptors / Indicators: Aching;Sore Pain Intervention(s): Limited activity within patient's tolerance;Monitored during session;Premedicated before session;Ice applied    Home Living Family/patient expects to be discharged to:: Skilled nursing facility                      Prior Function Level of Independence: Independent;Independent with assistive device(s)               Hand Dominance   Dominant Hand: Right    Extremity/Trunk Assessment   Upper Extremity Assessment: Generalized weakness           Lower Extremity Assessment: RLE deficits/detail      Cervical / Trunk Assessment: Kyphotic  Communication   Communication: HOH  Cognition Arousal/Alertness: Awake/alert Behavior During Therapy: WFL for tasks assessed/performed Overall Cognitive Status: Within Functional Limits for tasks assessed                      General Comments      Exercises        Assessment/Plan    PT Assessment Patient needs continued PT services  PT Diagnosis Difficulty walking   PT Problem List Decreased strength;Decreased range of motion;Decreased  activity tolerance;Decreased mobility;Decreased knowledge of use of DME;Obesity;Pain  PT Treatment Interventions DME instruction;Gait training;Stair training;Functional mobility training;Therapeutic activities;Therapeutic exercise;Patient/family education   PT Goals (Current goals can be found in the Care Plan section) Acute Rehab PT Goals Patient Stated [facility] PT Goal Formulation: With patient Time For Goal Achievement: 02/17/15 Potential to Achieve Goals: Good    Frequency 7X/week   Barriers to discharge        Co-evaluation               End of Session  Equipment Utilized During Treatment: Gait belt;Right knee immobilizer Activity Tolerance: Patient tolerated treatment well;Patient limited by fatigue;Patient limited by pain Patient left: in chair;with call bell/phone within reach;with nursing/sitter in room Nurse Communication: Mobility status         Time: [id number] PT Time Calculation (min) (ACUTE ONLY): 26 min   Charges:   PT Evaluation $Initial PT Evaluation Tier I: 1 Procedure PT Treatments $Gait Training: 8-22 mins   PT G Codes:        Ashtyn Freilich 02/12/2015, 12:24 PM

## 2015-02-12 NOTE — Progress Notes (Signed)
Subjective: 1 Day Post-Op Procedure(s) (LRB): TOTAL KNEE ARTHROPLASTY (Right) Patient reports pain as moderate.   Patient seen in rounds for Dr. [name]. Patient is well, and has had no acute complaints or problems other than pain in the right knee. She reports that she did not have much rest. No SOB or chest pain.  Plan is to go Skilled nursing facility after hospital stay.  Objective: Vital signs in last 24 hours: Temp:  [97.4 F (36.3 C)-99.4 F (37.4 C)] 98.5 F (36.9 C) (10/27 0434) Pulse Rate:  [73-92] 85 (10/27 0434) Resp:  [11-22] 16 (10/27 0434) BP: (139-207)/(55-89) 186/67 mmHg (10/27 0434) SpO2:  [90 %-100 %] 91 % (10/27 0434)  Intake/Output from previous day:  Intake/Output Summary (Last 24 hours) at 02/12/15 0718 Last data filed at 02/12/15 0600  Gross per 24 hour  Intake   3490 ml  Output   2090 ml  Net   1400 ml     Labs:  Recent Labs  02/12/15 0555  HGB 11.1*    Recent Labs  02/12/15 0555  WBC 12.9*  RBC 3.66*  HCT 34.2*  PLT 206    Recent Labs  02/12/15 0555  NA 136  K 4.1  CL 102  CO2 26  BUN 14  CREATININE 0.78  GLUCOSE 140*  CALCIUM 8.8*    EXAM General - Patient is Alert and Oriented Extremity - Neurologically intact Intact pulses distally Dorsiflexion/Plantar flexion intact No cellulitis present Compartment soft Dressing - dressing C/D/I Motor Function - intact, moving foot and toes well on exam.  Hemovac pulled without difficulty.  Past Medical History  Diagnosis Date  . Hypertension   . Abnormality of gait   . Disturbance of skin sensation   . Tachycardia, unspecified   . Internal hemorrhoids without mention of complication   . Synovial cyst of popliteal space   . Spinal stenosis, other region   . Sciatica   . Lumbago   . Unspecified vitamin D deficiency   . Anxiety   . Hyperlipidemia   . Abnormality of gait   . Unspecified sinusitis (chronic)   . Unspecified pruritic disorder   . Pain in joint, lower  leg   . Depressive disorder, not elsewhere classified   . Unspecified glaucoma   . Reflux esophagitis   . Diverticulosis of colon (without mention of hemorrhage)   . Pain in joint, site unspecified   . Myalgia and myositis, unspecified   . Osteoporosis, unspecified   . Other malaise and fatigue   . Cervicalgia   . Benign neoplasm of colon   . H/O echocardiogram 09/01/11    EF >55%, mild MR, RV systolic pressure elevated at 16-1030-40 mmHg, mod TR  . Normal cardiac stress test 11/16/09    low risk scan, EF  77%  . Shortness of breath   . Stroke [facility](HCC) "a couple of years ago"     visual problems left eye  . Arthritis   . Swelling of both ankles     rt leg  . Bruises easily     Assessment/Plan: 1 Day Post-Op Procedure(s) (LRB): TOTAL KNEE ARTHROPLASTY (Right) Active Problems:   History of total knee arthroplasty  Estimated body mass index is 31.38 kg/(m^2) as calculated from the following:   Height as of this encounter: 5\' 1"  (1.549 m).   Weight as of this encounter: 75.297 kg (166 lb). Advance diet Up with therapy D/C IV fluids when tolerating POs well Plan for discharge tomorrow  DVT Prophylaxis -  Xarelto Weight-Bearing as tolerated    She is doing fair this morning. She is oriented but is easily forgetful. Will try to limit pain meds to Vicodin. Plan for DC to [facility] tomorrow pending progress.   Dimitri Ped, PA-C Orthopaedic Surgery 02/12/2015, 7:18 AM

## 2015-02-12 NOTE — Progress Notes (Signed)
OT Note  Patient Details Name: Carey Bullocksnn T Jewell MRN: [id number] DOB: [birth year]   Cancelled Treatment:    Reason Eval/Treat Not Completed: Other (comment)  Noted plans for SNF- will defer OT eval to SNF  [facility], [place] KungLorraine D 02/12/2015, 10:59 AM

## 2015-02-13 LAB — CBC
HCT: 35.7 % — ABNORMAL LOW (ref 36.0–46.0)
HEMOGLOBIN: 11.8 g/dL — AB (ref 12.0–15.0)
MCH: 31.2 pg (ref 26.0–34.0)
MCHC: 33.1 g/dL (ref 30.0–36.0)
MCV: 94.4 fL (ref 78.0–100.0)
PLATELETS: 221 10*3/uL (ref 150–400)
RBC: 3.78 MIL/uL — ABNORMAL LOW (ref 3.87–5.11)
RDW: 14.4 % (ref 11.5–15.5)
WBC: 12.3 10*3/uL — ABNORMAL HIGH (ref 4.0–10.5)

## 2015-02-13 LAB — URINALYSIS, ROUTINE W REFLEX MICROSCOPIC
Bilirubin Urine: NEGATIVE
Glucose, UA: NEGATIVE mg/dL
Hgb urine dipstick: NEGATIVE
Ketones, ur: NEGATIVE mg/dL
Leukocytes, UA: NEGATIVE
Nitrite: POSITIVE — AB
Protein, ur: NEGATIVE mg/dL
Specific Gravity, Urine: 1.011 (ref 1.005–1.030)
Urobilinogen, UA: 1 mg/dL (ref 0.0–1.0)
pH: 8.5 — ABNORMAL HIGH (ref 5.0–8.0)

## 2015-02-13 LAB — BASIC METABOLIC PANEL
Anion gap: 9 (ref 5–15)
BUN: 13 mg/dL (ref 6–20)
CALCIUM: 8.9 mg/dL (ref 8.9–10.3)
CHLORIDE: 99 mmol/L — AB (ref 101–111)
CO2: 31 mmol/L (ref 22–32)
CREATININE: 0.82 mg/dL (ref 0.44–1.00)
GFR calc non Af Amer: >60 mL/min (ref >60)
Glucose, Bld: 140 mg/dL — ABNORMAL HIGH (ref 65–99)
Potassium: 3.7 mmol/L (ref 3.5–5.1)
SODIUM: 139 mmol/L (ref 135–145)

## 2015-02-13 LAB — URINE MICROSCOPIC-ADD ON

## 2015-02-13 MED ORDER — TRAMADOL HCL 50 MG PO TABS: 50.0000 mg | ORAL_TABLET | Freq: Four times a day (QID) | ORAL | Status: DC | PRN

## 2015-02-13 MED ORDER — HYDROCODONE-ACETAMINOPHEN 5-325 MG PO TABS: 1.0000 | ORAL_TABLET | ORAL | Status: DC | PRN

## 2015-02-13 MED ORDER — CIPROFLOXACIN HCL 500 MG PO TABS
500.0000 mg | ORAL_TABLET | Freq: Two times a day (BID) | ORAL | Status: DC
  Administered 2015-02-13 – 2015-02-14 (×2): 500 mg via ORAL
  Filled 2015-02-13 (×4): qty 1

## 2015-02-13 NOTE — Progress Notes (Signed)
Subjective: 2 Days Post-Op Procedure(s) (LRB): TOTAL KNEE ARTHROPLASTY (Right) Patient reports pain as 3 on 0-10 scale.    Objective: Vital signs in last 24 hours: Temp:  [98 F (36.7 C)-98.5 F (36.9 C)] 98.5 F (36.9 C) (10/28 0542) Pulse Rate:  [80-107] 107 (10/28 0542) Resp:  [14-20] 20 (10/28 0542) BP: (136-160)/(51-97) 160/97 mmHg (10/28 0542) SpO2:  [90 %-95 %] 90 % (10/27 2058)  Intake/Output from previous day: 10/27 0701 - 10/28 0700 In: 1390 [P.O.:720; I.V.:670] Out: 1100 [Urine:1100] Intake/Output this shift:     Recent Labs  02/12/15 0555  HGB 11.1*    Recent Labs  02/12/15 0555  WBC 12.9*  RBC 3.66*  HCT 34.2*  PLT 206    Recent Labs  02/12/15 0555  NA 136  K 4.1  CL 102  CO2 26  BUN 14  CREATININE 0.78  GLUCOSE 140*  CALCIUM 8.8*   No results for input(s): LABPT, INR in the last 72 hours.  Dorsiflexion/Plantar flexion intact  Assessment/Plan: 2 Days Post-Op Procedure(s) (LRB): TOTAL KNEE ARTHROPLASTY (Right) Up with therapy Discharge to SNF  Kristin Wilkerson A 02/13/2015, 7:10 AM

## 2015-02-13 NOTE — Care Management Important Message (Signed)
Important Message  Patient Details  Name: Kristin Wilkerson MRN: [id number] Date of Birth: 22-Jun-1925   Medicare Important Message Given:  Yes-second notification given    Renie OraHawkins, Jesenia Spera Smith 02/13/2015, 3:58 PMImportant Message  Patient Details  Name: Kristin Wilkerson MRN: [id number] Date of Birth: 22-Jun-1925   Medicare Important Message Given:  Yes-second notification given    Renie OraHawkins, Wali Reinheimer Smith 02/13/2015, 3:58 PM

## 2015-02-13 NOTE — Discharge Summary (Signed)
Physician Discharge Summary   Patient ID: Kristin Wilkerson MRN: [id number] DOB/AGE: [birth year] 81 y.o.  Admit date: 02/11/2015 Discharge date: 02/13/2015  Primary Diagnosis: Primary osteoarthritis right knee  Admission Diagnoses:  Past Medical History  Diagnosis Date  . Hypertension   . Abnormality of gait   . Disturbance of skin sensation   . Tachycardia, unspecified   . Internal hemorrhoids without mention of complication   . Synovial cyst of popliteal space   . Spinal stenosis, other region   . Sciatica   . Lumbago   . Unspecified vitamin D deficiency   . Anxiety   . Hyperlipidemia   . Abnormality of gait   . Unspecified sinusitis (chronic)   . Unspecified pruritic disorder   . Pain in joint, lower leg   . Depressive disorder, not elsewhere classified   . Unspecified glaucoma   . Reflux esophagitis   . Diverticulosis of colon (without mention of hemorrhage)   . Pain in joint, site unspecified   . Myalgia and myositis, unspecified   . Osteoporosis, unspecified   . Other malaise and fatigue   . Cervicalgia   . Benign neoplasm of colon   . H/O echocardiogram 09/01/11    EF >55%, mild MR, RV systolic pressure elevated at 30-40 mmHg, mod TR  . Normal cardiac stress test 11/16/09    low risk scan, EF  77%  . Shortness of breath   . Stroke [facility]) "a couple of years ago"     visual problems left eye  . Arthritis   . Swelling of both ankles     rt leg  . Bruises easily    Discharge Diagnoses:   Active Problems:   History of total knee arthroplasty  Estimated body mass index is 31.38 kg/(m^2) as calculated from the following:   Height as of this encounter: _0  (1.549 m).   Weight as of this encounter: 75.297 kg (166 lb).  Procedure:  Procedure(s) (LRB): TOTAL KNEE ARTHROPLASTY (Right)   Consults: None  HPI: Kristin Wilkerson, 81 y.o. female, has a history of pain and functional disability in the right knee due to arthritis and has failed non-surgical conservative  treatments for greater than 12 weeks to includeNSAID's and/or analgesics, corticosteriod injections, viscosupplementation injections, use of assistive devices and activity modification. Onset of symptoms was gradual, starting 5 years ago with gradually worsening course since that time. The patient noted no past surgery on the right knee(s). Patient currently rates pain in the right knee(s) at 7 out of 10 with activity. Patient has night pain, worsening of pain with activity and weight bearing, pain that interferes with activities of daily living, pain with passive range of motion, crepitus and joint swelling. Patient has evidence of periarticular osteophytes and joint space narrowing by imaging studies. There is no active infection.  Laboratory Data: Admission on 02/11/2015  Component Date Value Ref Range Status  . WBC 02/12/2015 12.9* 4.0 - 10.5 K/uL Final  . RBC 02/12/2015 3.66* 3.87 - 5.11 MIL/uL Final  . Hemoglobin 02/12/2015 11.1* 12.0 - 15.0 g/dL Final  . HCT 02/12/2015 34.2* 36.0 - 46.0 % Final  . MCV 02/12/2015 93.4  78.0 - 100.0 fL Final  . MCH 02/12/2015 30.3  26.0 - 34.0 pg Final  . MCHC 02/12/2015 32.5  30.0 - 36.0 g/dL Final  . RDW 02/12/2015 13.8  11.5 - 15.5 % Final  . Platelets 02/12/2015 206  150 - 400 K/uL Final  . Sodium 02/12/2015 136  135 -  145 mmol/L Final  . Potassium 02/12/2015 4.1  3.5 - 5.1 mmol/L Final  . Chloride 02/12/2015 102  101 - 111 mmol/L Final  . CO2 02/12/2015 26  22 - 32 mmol/L Final  . Glucose, Bld 02/12/2015 140* 65 - 99 mg/dL Final  . BUN 02/12/2015 14  6 - 20 mg/dL Final  . Creatinine, Ser 02/12/2015 0.78  0.44 - 1.00 mg/dL Final  . Calcium 02/12/2015 8.8* 8.9 - 10.3 mg/dL Final  . GFR calc non Af Amer 02/12/2015 >60  >60 mL/min Final  . GFR calc Af Amer 02/12/2015 >60  >60 mL/min Final   Comment: (NOTE) The eGFR has been calculated using the CKD EPI equation. This calculation has not been validated in all clinical situations. eGFR's  persistently <60 mL/min signify possible Chronic Kidney Disease.   Georgiann Hahn gap 02/12/2015 8  5 - 15 Final  Hospital Outpatient Visit on 02/03/2015  Component Date Value Ref Range Status  . MRSA, PCR 02/03/2015 NEGATIVE  NEGATIVE Final  . Staphylococcus aureus 02/03/2015 NEGATIVE  NEGATIVE Final   Comment:        The Xpert SA Assay (FDA approved for NASAL specimens in patients over 32 years of age), is one component of a comprehensive surveillance program.  Test performance has been validated by [facility] for patients greater than or equal to 43 year old. It is not intended to diagnose infection nor to guide or monitor treatment.   [name] aPTT 02/03/2015 25  24 - 37 seconds Final  . WBC 02/03/2015 6.3  4.0 - 10.5 K/uL Final  . RBC 02/03/2015 4.37  3.87 - 5.11 MIL/uL Final  . Hemoglobin 02/03/2015 13.3  12.0 - 15.0 g/dL Final  . HCT 02/03/2015 41.0  36.0 - 46.0 % Final  . MCV 02/03/2015 93.8  78.0 - 100.0 fL Final  . MCH 02/03/2015 30.4  26.0 - 34.0 pg Final  . MCHC 02/03/2015 32.4  30.0 - 36.0 g/dL Final  . RDW 02/03/2015 13.7  11.5 - 15.5 % Final  . Platelets 02/03/2015 197  150 - 400 K/uL Final  . Neutrophils Relative % 02/03/2015 56   Final  . Neutro Abs 02/03/2015 3.5  1.7 - 7.7 K/uL Final  . Lymphocytes Relative 02/03/2015 28   Final  . Lymphs Abs 02/03/2015 1.8  0.7 - 4.0 K/uL Final  . Monocytes Relative 02/03/2015 13   Final  . Monocytes Absolute 02/03/2015 0.8  0.1 - 1.0 K/uL Final  . Eosinophils Relative 02/03/2015 2   Final  . Eosinophils Absolute 02/03/2015 0.2  0.0 - 0.7 K/uL Final  . Basophils Relative 02/03/2015 1   Final  . Basophils Absolute 02/03/2015 0.0  0.0 - 0.1 K/uL Final  . Sodium 02/03/2015 141  135 - 145 mmol/L Final  . Potassium 02/03/2015 3.9  3.5 - 5.1 mmol/L Final  . Chloride 02/03/2015 103  101 - 111 mmol/L Final  . CO2 02/03/2015 31  22 - 32 mmol/L Final  . Glucose, Bld 02/03/2015 101* 65 - 99 mg/dL Final  . BUN 02/03/2015 19  6 - 20 mg/dL  Final  . Creatinine, Ser 02/03/2015 0.90  0.44 - 1.00 mg/dL Final  . Calcium 02/03/2015 9.6  8.9 - 10.3 mg/dL Final  . Total Protein 02/03/2015 7.0  6.5 - 8.1 g/dL Final  . Albumin 02/03/2015 4.2  3.5 - 5.0 g/dL Final  . AST 02/03/2015 26  15 - 41 U/L Final  . ALT 02/03/2015 21  14 - 54 U/L Final  .  Alkaline Phosphatase 02/03/2015 73  38 - 126 U/L Final  . Total Bilirubin 02/03/2015 0.7  0.3 - 1.2 mg/dL Final  . GFR calc non Af Amer 02/03/2015 56* >60 mL/min Final  . GFR calc Af Amer 02/03/2015 >60  >60 mL/min Final   Comment: (NOTE) The eGFR has been calculated using the CKD EPI equation. This calculation has not been validated in all clinical situations. eGFR's persistently <60 mL/min signify possible Chronic Kidney Disease.   . Anion gap 02/03/2015 7  5 - 15 Final  . Prothrombin Time 02/03/2015 13.0  11.6 - 15.2 seconds Final  . INR 02/03/2015 0.96  0.00 - 1.49 Final  . ABO/RH(D) 02/03/2015 O POS   Final  . Antibody Screen 02/03/2015 NEG   Final  . Sample Expiration 02/03/2015 02/14/2015   Final  . Color, Urine 02/03/2015 YELLOW  YELLOW Final  . APPearance 02/03/2015 CLEAR  CLEAR Final  . Specific Gravity, Urine 02/03/2015 1.011  1.005 - 1.030 Final  . pH 02/03/2015 7.0  5.0 - 8.0 Final  . Glucose, UA 02/03/2015 NEGATIVE  NEGATIVE mg/dL Final  . Hgb urine dipstick 02/03/2015 NEGATIVE  NEGATIVE Final  . Bilirubin Urine 02/03/2015 NEGATIVE  NEGATIVE Final  . Ketones, ur 02/03/2015 NEGATIVE  NEGATIVE mg/dL Final  . Protein, ur 02/03/2015 NEGATIVE  NEGATIVE mg/dL Final  . Urobilinogen, UA 02/03/2015 1.0  0.0 - 1.0 mg/dL Final  . Nitrite 02/03/2015 NEGATIVE  NEGATIVE Final  . Leukocytes, UA 02/03/2015 NEGATIVE  NEGATIVE Final   MICROSCOPIC NOT DONE ON URINES WITH NEGATIVE PROTEIN, BLOOD, LEUKOCYTES, NITRITE, OR GLUCOSE <1000 mg/dL.      Hospital Course: SIMCHA FARRINGTON is a 81 y.o. who was admitted to [facility]. They were brought to the operating room on 02/11/2015 and  underwent Procedure(s): TOTAL KNEE ARTHROPLASTY.  Patient tolerated the procedure well and was later transferred to the recovery room and then to the orthopaedic floor for postoperative care.  They were given PO and IV analgesics for pain control following their surgery.  They were given 24 hours of postoperative antibiotics of  Anti-infectives    Start     Dose/Rate Route Frequency Ordered Stop   02/11/15 1400  ceFAZolin (ANCEF) IVPB 1 g/50 mL premix     1 g 100 mL/hr over 30 Minutes Intravenous Every 6 hours 02/11/15 1207 02/11/15 2003   02/11/15 0817  polymyxin B 500,000 Units, bacitracin 50,000 Units in sodium chloride irrigation 0.9 % 500 mL irrigation  Status:  Discontinued       As needed 02/11/15 0817 02/11/15 0933   02/11/15 0619  ceFAZolin (ANCEF) IVPB 2 g/50 mL premix     2 g 100 mL/hr over 30 Minutes Intravenous On call to O.R. 02/11/15 [id number] 02/11/15 0730     and started on DVT prophylaxis in the form of Xarelto.   PT and OT were ordered for total joint protocol.  Discharge planning consulted to help with postop disposition and equipment needs.  Patient had a fair night on the evening of surgery.  They started to get up OOB with therapy on day one. Hemovac drain was pulled without difficulty.  Continued to work with therapy into day two.  Aquacel dressing changed due to bloody drainage.  Patient had some confusion so attempts made to limit patient to Tylenol or Tramadol. Patient was seen in rounds and was ready for discharge to SNF. Patient to resume Plavix upon discharge.    Diet: Cardiac diet Activity:WBAT Follow-up:in 2 weeks Disposition -  Skilled nursing facility Discharged Condition: stable   Discharge Instructions    Call MD / Call 911    Complete by:  As directed   If you experience chest pain or shortness of breath, CALL 911 and be transported to the hospital emergency room.  If you develope a fever above 101 F, pus (white drainage) or increased drainage or redness at the  wound, or calf pain, call your surgeon's office.     Constipation Prevention    Complete by:  As directed   Drink plenty of fluids.  Prune juice may be helpful.  You may use a stool softener, such as Colace (over the counter) 100 mg twice a day.  Use MiraLax (over the counter) for constipation as needed.     Diet - low sodium heart healthy    Complete by:  As directed      Discharge instructions    Complete by:  As directed   INSTRUCTIONS AFTER JOINT REPLACEMENT   Remove items at home which could result in a fall. This includes throw rugs or furniture in walking pathways ICE to the affected joint every three hours while awake for 30 minutes at a time, for at least the first 3-5 days, and then as needed for pain and swelling.  Continue to use ice for pain and swelling. You may notice swelling that will progress down to the foot and ankle.  This is normal after surgery.  Elevate your leg when you are not up walking on it.   Continue to use the breathing machine you got in the hospital (incentive spirometer) which will help keep your temperature down.  It is common for your temperature to cycle up and down following surgery, especially at night when you are not up moving around and exerting yourself.  The breathing machine keeps your lungs expanded and your temperature down.   DIET:  As you were doing prior to hospitalization, we recommend a well-balanced diet.  DRESSING / WOUND CARE / SHOWERING  Keep the surgical dressing until follow up.  The dressing is water proof, so you can shower without any extra covering.  IF THE DRESSING FALLS OFF or the wound gets wet inside, change the dressing with sterile gauze.  Please use good hand washing techniques before changing the dressing.  Do not use any lotions or creams on the incision until instructed by your surgeon.    ACTIVITY  Increase activity slowly as tolerated, but follow the weight bearing instructions below.   No driving for 6 weeks or until  further direction given by your physician.  You cannot drive while taking narcotics.  No lifting or carrying greater than 10 lbs. until further directed by your surgeon. Avoid periods of inactivity such as sitting longer than an hour when not asleep. This helps prevent blood clots.  You may return to work once you are authorized by your doctor.     WEIGHT BEARING   Weight bearing as tolerated with assist device (walker, cane, etc) as directed, use it as long as suggested by your surgeon or therapist, typically at least 4-6 weeks.   EXERCISES  Results after joint replacement surgery are often greatly improved when you follow the exercise, range of motion and muscle strengthening exercises prescribed by your doctor. Safety measures are also important to protect the joint from further injury. Any time any of these exercises cause you to have increased pain or swelling, decrease what you are doing until you are comfortable again and  then slowly increase them. If you have problems or questions, call your caregiver or physical therapist for advice.   Rehabilitation is important following a joint replacement. After just a few days of immobilization, the muscles of the leg can become weakened and shrink (atrophy).  These exercises are designed to build up the tone and strength of the thigh and leg muscles and to improve motion. Often times heat used for twenty to thirty minutes before working out will loosen up your tissues and help with improving the range of motion but do not use heat for the first two weeks following surgery (sometimes heat can increase post-operative swelling).   These exercises can be done on a training (exercise) mat, on the floor, on a table or on a bed. Use whatever works the best and is most comfortable for you.    Use music or television while you are exercising so that the exercises are a pleasant break in your day. This will make your life better with the exercises acting as a  break in your routine that you can look forward to.   Perform all exercises about fifteen times, three times per day or as directed.  You should exercise both the operative leg and the other leg as well.  Exercises include:   Quad Sets - Tighten up the muscle on the front of the thigh (Quad) and hold for 5-10 seconds.   Straight Leg Raises - With your knee straight (if you were given a brace, keep it on), lift the leg to 60 degrees, hold for 3 seconds, and slowly lower the leg.  Perform this exercise against resistance later as your leg gets stronger.  Leg Slides: Lying on your back, slowly slide your foot toward your buttocks, bending your knee up off the floor (only go as far as is comfortable). Then slowly slide your foot back down until your leg is flat on the floor again.  Angel Wings: Lying on your back spread your legs to the side as far apart as you can without causing discomfort.  Hamstring Strength:  Lying on your back, push your heel against the floor with your leg straight by tightening up the muscles of your buttocks.  Repeat, but this time bend your knee to a comfortable angle, and push your heel against the floor.  You may put a pillow under the heel to make it more comfortable if necessary.   A rehabilitation program following joint replacement surgery can speed recovery and prevent re-injury in the future due to weakened muscles. Contact your doctor or a physical therapist for more information on knee rehabilitation.    CONSTIPATION  Constipation is defined medically as fewer than three stools per week and severe constipation as less than one stool per week.  Even if you have a regular bowel pattern at home, your normal regimen is likely to be disrupted due to multiple reasons following surgery.  Combination of anesthesia, postoperative narcotics, change in appetite and fluid intake all can affect your bowels.   YOU MUST use at least one of the following options; they are listed in  order of increasing strength to get the job done.  They are all available over the counter, and you may need to use some, POSSIBLY even all of these options:    Drink plenty of fluids (prune juice may be helpful) and high fiber foods Colace 100 mg by mouth twice a day  Senokot for constipation as directed and as needed Dulcolax (bisacodyl), take  with full glass of water  Miralax (polyethylene glycol) once or twice a day as needed.  If you have tried all these things and are unable to have a bowel movement in the first 3-4 days after surgery call either your surgeon or your primary doctor.    If you experience loose stools or diarrhea, hold the medications until you stool forms back up.  If your symptoms do not get better within 1 week or if they get worse, check with your doctor.  If you experience "the worst abdominal pain ever" or develop nausea or vomiting, please contact the office immediately for further recommendations for treatment.   ITCHING:  If you experience itching with your medications, try taking only a single pain pill, or even half a pain pill at a time.  You can also use Benadryl over the counter for itching or also to help with sleep.   TED HOSE STOCKINGS:  Use stockings on both legs until for at least 2 weeks or as directed by physician office. They may be removed at night for sleeping.  MEDICATIONS:  See your medication summary on the "After Visit Summary" that nursing will review with you.  You may have some home medications which will be placed on hold until you complete the course of blood thinner medication.  It is important for you to complete the blood thinner medication as prescribed.  PRECAUTIONS:  If you experience chest pain or shortness of breath - call 911 immediately for transfer to the hospital emergency department.   If you develop a fever greater that 101 F, purulent drainage from wound, increased redness or drainage from wound, foul odor from the  wound/dressing, or calf pain - CONTACT YOUR SURGEON.                                                   FOLLOW-UP APPOINTMENTS:  If you do not already have a post-op appointment, please call the office for an appointment to be seen by your surgeon.  Guidelines for how soon to be seen are listed in your "After Visit Summary", but are typically between 1-4 weeks after surgery.   MAKE SURE YOU:  Understand these instructions.  Get help right away if you are not doing well or get worse.    Thank you for letting us be a part of your medical care team.  It is a privilege we respect greatly.  We hope these instructions will help you stay on track for a fast and full recovery!     Increase activity slowly as tolerated    Complete by:  As directed             Medication List    TAKE these medications        acetaminophen 500 MG tablet  Commonly known as:  TYLENOL  Take 1,000 mg by mouth daily as needed for moderate pain.     atorvastatin 20 MG tablet  Commonly known as:  LIPITOR  TAKE 1 TABLET BY MOUTH EVERY EVENING TO CONTROL CHOLESTEROL     Calcium Carbonate-Vitamin D 600-400 MG-UNIT tablet  Commonly known as:  CALCIUM 600+D  Take one tablet by mouth daily for calcium supplement     cetirizine 10 MG tablet  Commonly known as:  ZYRTEC  Take 1 tablet by mouth daily.  cholecalciferol 1000 UNITS tablet  Commonly known as:  VITAMIN D  Take 1,000 Units by mouth daily.     clopidogrel 75 MG tablet  Commonly known as:  PLAVIX  TAKE 1 TABLET BY MOUTH EVERY MORNING FOR ANTICOAGULATION     clotrimazole 1 % cream  Commonly known as:  LOTRIMIN  Apply twice daily to rash until resolved     DULoxetine 30 MG capsule  Commonly known as:  CYMBALTA  One daily to help nerves     furosemide 20 MG tablet  Commonly known as:  LASIX  Take one every other day for diuretic     furosemide 20 MG tablet  Commonly known as:  LASIX  TAKE 1 TABLET BY MOUTH EVERY DAY      HYDROcodone-acetaminophen 5-325 MG tablet  Commonly known as:  NORCO/VICODIN  Take 1-2 tablets by mouth every 4 (four) hours as needed (breakthrough pain).     latanoprost 0.005 % ophthalmic solution  Commonly known as:  XALATAN  INSTILL 1 DROP TO BOTH EYES AT BEDTIME TO TREAT GLAUCOMA     losartan 100 MG tablet  Commonly known as:  COZAAR  TAKE 1 TABLET BY MOUTH ONCE DAILY FOR BLOOD PRESSURE     metoprolol 100 MG tablet  Commonly known as:  LOPRESSOR  TAKE 1 TABLET BY MOUTH TWICE DAILY TO CONTORL BLOOD PRESSURE     traMADol 50 MG tablet  Commonly known as:  ULTRAM  Take 1-2 tablets (50-100 mg total) by mouth every 6 (six) hours as needed for moderate pain.           Follow-up Information    Follow up with GIOFFRE,RONALD A, MD. Schedule an appointment as soon as possible for a visit in 2 weeks.   Specialty:  Orthopedic Surgery   Contact information:   [address] [place] [address] [phone]       Signed: Ardeen Jourdain, PA-C Orthopaedic Surgery 02/13/2015, 7:17 AM

## 2015-02-13 NOTE — Progress Notes (Signed)
Physical Therapy Treatment Patient Details Name: Kristin Wilkerson MRN: [id number] DOB: Aug 24, 1925 Today's Date: 02/13/2015    History of Present Illness Pt s/p R TKR    PT Comments    Pt. Appeared very lethargic upon entering room; required max assist x2 to get from supine to sit giving ~5%; sitting EOB for 3 min produced no increase in cognition and pt was unable to initiate any movement for STS; brought pt. Back from sit to supine requiring max assist x2 and giving ~5%; informed nursing of patient progress   Follow Up Recommendations  SNF     Equipment Recommendations  None recommended by PT    Recommendations for Other Services       Precautions / Restrictions Precautions Precautions: Knee;Fall Required Braces or Orthoses: Knee Immobilizer - Right Knee Immobilizer - Right: Discontinue once straight leg raise with < 10 degree lag Restrictions Weight Bearing Restrictions: No Other Position/Activity Restrictions: WBAT    Mobility  Bed Mobility Overal bed mobility: +2 for physical assistance;Needs Assistance Bed Mobility: Supine to Sit;Sit to Supine     Supine to sit: total assist;+2 for physical assistance        Pt 5% Sit to supine: total assist;+2 for physical assistance        Pt 0%   General bed mobility comments: pt. not able to follow one step commands consistently to move from supine to sit; required max assist - giving about 5% assist; static sitting EOB x3 min did not increase cognition  Transfers Overall transfer level: Needs assistance   Transfers: Sit to/from Stand           General transfer comment: attempted STS transfer but patient unable to initiate or offer any assistance; unsuccessful  Too lethargic  Ambulation/Gait                 Stairs            Wheelchair Mobility    Modified Rankin (Stroke Patients Only)       Balance                                    Cognition Arousal/Alertness: Lethargic Behavior  During Therapy: Flat affect                        Exercises      General Comments        Pertinent Vitals/Pain Pain Assessment: Faces Faces Pain Scale: Hurts a little bit (lethargic but with movemement of supine to sit had some grimacing and moaning ) Pain Intervention(s): Limited activity within patient's tolerance;Monitored during session;Premedicated before session;Ice applied    Home Living                      Prior Function            PT Goals (current goals can now be found in the care plan section) Acute Rehab PT Goals Time For Goal Achievement: 02/17/15 Potential to Achieve Goals: Good Progress towards PT goals: Progressing toward goals    Frequency  7X/week    PT Plan Current plan remains appropriate    Co-evaluation             End of Session Equipment Utilized During Treatment: Gait belt;Right knee immobilizer Activity Tolerance: Patient limited by lethargy Patient left: in bed;with call bell/phone within reach  Time: 1405-1430 PT Time Calculation (min) (ACUTE ONLY): 25 min  Charges:  $Therapeutic Activity: 23-37 mins                    G CodesMarin Comment 02/13/2015, 3:22 PM   Reviewed and agree with above Felecia Shelling  PTA [facility]  Acute  Rehab Pager      [phone]

## 2015-02-13 NOTE — Discharge Instructions (Addendum)
INSTRUCTIONS AFTER JOINT REPLACEMENT  ° °o Remove items at home which could result in a fall. This includes throw rugs or furniture in walking pathways °o ICE to the affected joint every three hours while awake for 30 minutes at a time, for at least the first 3-5 days, and then as needed for pain and swelling.  Continue to use ice for pain and swelling. You may notice swelling that will progress down to the foot and ankle.  This is normal after surgery.  Elevate your leg when you are not up walking on it.   °o Continue to use the breathing machine you got in the hospital (incentive spirometer) which will help keep your temperature down.  It is common for your temperature to cycle up and down following surgery, especially at night when you are not up moving around and exerting yourself.  The breathing machine keeps your lungs expanded and your temperature down. ° ° °DIET:  As you were doing prior to hospitalization, we recommend a well-balanced diet. ° °DRESSING / WOUND CARE / SHOWERING ° °Keep the surgical dressing until follow up.  The dressing is water proof, so you can shower without any extra covering.  IF THE DRESSING FALLS OFF or the wound gets wet inside, change the dressing with sterile gauze.  Please use good hand washing techniques before changing the dressing.  Do not use any lotions or creams on the incision until instructed by your surgeon.   ° °ACTIVITY ° °o Increase activity slowly as tolerated, but follow the weight bearing instructions below.   °o No driving for 6 weeks or until further direction given by your physician.  You cannot drive while taking narcotics.  °o No lifting or carrying greater than 10 lbs. until further directed by your surgeon. °o Avoid periods of inactivity such as sitting longer than an hour when not asleep. This helps prevent blood clots.  °o You may return to work once you are authorized by your doctor.  ° ° ° °WEIGHT BEARING  ° °Weight bearing as tolerated with assist  device (walker, cane, etc) as directed, use it as long as suggested by your surgeon or therapist, typically at least 4-6 weeks. ° ° °EXERCISES ° °Results after joint replacement surgery are often greatly improved when you follow the exercise, range of motion and muscle strengthening exercises prescribed by your doctor. Safety measures are also important to protect the joint from further injury. Any time any of these exercises cause you to have increased pain or swelling, decrease what you are doing until you are comfortable again and then slowly increase them. If you have problems or questions, call your caregiver or physical therapist for advice.  ° °Rehabilitation is important following a joint replacement. After just a few days of immobilization, the muscles of the leg can become weakened and shrink (atrophy).  These exercises are designed to build up the tone and strength of the thigh and leg muscles and to improve motion. Often times heat used for twenty to thirty minutes before working out will loosen up your tissues and help with improving the range of motion but do not use heat for the first two weeks following surgery (sometimes heat can increase post-operative swelling).  ° °These exercises can be done on a training (exercise) mat, on the floor, on a table or on a bed. Use whatever works the best and is most comfortable for you.    Use music or television while you are exercising so that   the exercises are a pleasant break in your day. This will make your life better with the exercises acting as a break in your routine that you can look forward to.   Perform all exercises about fifteen times, three times per day or as directed.  You should exercise both the operative leg and the other leg as well. ° °Exercises include: °  °• Quad Sets - Tighten up the muscle on the front of the thigh (Quad) and hold for 5-10 seconds.   °• Straight Leg Raises - With your knee straight (if you were given a brace, keep it on),  lift the leg to 60 degrees, hold for 3 seconds, and slowly lower the leg.  Perform this exercise against resistance later as your leg gets stronger.  °• Leg Slides: Lying on your back, slowly slide your foot toward your buttocks, bending your knee up off the floor (only go as far as is comfortable). Then slowly slide your foot back down until your leg is flat on the floor again.  °• Angel Wings: Lying on your back spread your legs to the side as far apart as you can without causing discomfort.  °• Hamstring Strength:  Lying on your back, push your heel against the floor with your leg straight by tightening up the muscles of your buttocks.  Repeat, but this time bend your knee to a comfortable angle, and push your heel against the floor.  You may put a pillow under the heel to make it more comfortable if necessary.  ° °A rehabilitation program following joint replacement surgery can speed recovery and prevent re-injury in the future due to weakened muscles. Contact your doctor or a physical therapist for more information on knee rehabilitation.  ° ° °CONSTIPATION ° °Constipation is defined medically as fewer than three stools per week and severe constipation as less than one stool per week.  Even if you have a regular bowel pattern at home, your normal regimen is likely to be disrupted due to multiple reasons following surgery.  Combination of anesthesia, postoperative narcotics, change in appetite and fluid intake all can affect your bowels.  ° °YOU MUST use at least one of the following options; they are listed in order of increasing strength to get the job done.  They are all available over the counter, and you may need to use some, POSSIBLY even all of these options:   ° °Drink plenty of fluids (prune juice may be helpful) and high fiber foods °Colace 100 mg by mouth twice a day  °Senokot for constipation as directed and as needed Dulcolax (bisacodyl), take with full glass of water  °Miralax (polyethylene glycol)  once or twice a day as needed. ° °If you have tried all these things and are unable to have a bowel movement in the first 3-4 days after surgery call either your surgeon or your primary doctor.   ° °If you experience loose stools or diarrhea, hold the medications until you stool forms back up.  If your symptoms do not get better within 1 week or if they get worse, check with your doctor.  If you experience "the worst abdominal pain ever" or develop nausea or vomiting, please contact the office immediately for further recommendations for treatment. ° ° °ITCHING:  If you experience itching with your medications, try taking only a single pain pill, or even half a pain pill at a time.  You can also use Benadryl over the counter for itching or also to   help with sleep.  ° °TED HOSE STOCKINGS:  Use stockings on both legs until for at least 2 weeks or as directed by physician office. They may be removed at night for sleeping. ° °MEDICATIONS:  See your medication summary on the “After Visit Summary” that nursing will review with you.  You may have some home medications which will be placed on hold until you complete the course of blood thinner medication.  It is important for you to complete the blood thinner medication as prescribed. ° °PRECAUTIONS:  If you experience chest pain or shortness of breath - call 911 immediately for transfer to the hospital emergency department.  ° °If you develop a fever greater that 101 F, purulent drainage from wound, increased redness or drainage from wound, foul odor from the wound/dressing, or calf pain - CONTACT YOUR SURGEON.   °                                                °FOLLOW-UP APPOINTMENTS:  If you do not already have a post-op appointment, please call the office for an appointment to be seen by your surgeon.  Guidelines for how soon to be seen are listed in your “After Visit Summary”, but are typically between 1-4 weeks after surgery. ° ° ° °MAKE SURE YOU:  °• Understand these  instructions.  °• Get help right away if you are not doing well or get worse.  ° ° °Thank you for letting us be a part of your medical care team.  It is a privilege we respect greatly.  We hope these instructions will help you stay on track for a fast and full recovery!  ° ° °Information on my medicine - XARELTO® (Rivaroxaban) ° °This medication education was reviewed with me or my healthcare representative as part of my discharge preparation.  The pharmacist that spoke with me during my hospital stay was:  Pickering, Thomas Robert, RPH ° °Why was Xarelto® prescribed for you? °Xarelto® was prescribed for you to reduce the risk of blood clots forming after orthopedic surgery. The medical term for these abnormal blood clots is venous thromboembolism (VTE). ° °What do you need to know about xarelto® ? °Take your Xarelto® ONCE DAILY at the same time every day. °You may take it either with or without food. ° °If you have difficulty swallowing the tablet whole, you may crush it and mix in applesauce just prior to taking your dose. ° °Take Xarelto® exactly as prescribed by your doctor and DO NOT stop taking Xarelto® without talking to the doctor who prescribed the medication.  Stopping without other VTE prevention medication to take the place of Xarelto® may increase your risk of developing a clot. ° °After discharge, you should have regular check-up appointments with your healthcare provider that is prescribing your Xarelto®.   ° °What do you do if you miss a dose? °If you miss a dose, take it as soon as you remember on the same day then continue your regularly scheduled once daily regimen the next day. Do not take two doses of Xarelto® on the same day.  ° °Important Safety Information °A possible side effect of Xarelto® is bleeding. You should call your healthcare provider right away if you experience any of the following: °? Bleeding from an injury or your nose that does not stop. °? Unusual colored urine (  red or dark  brown) or unusual colored stools (red or black). °? Unusual bruising for unknown reasons. °? A serious fall or if you hit your head (even if there is no bleeding). ° °Some medicines may interact with Xarelto® and might increase your risk of bleeding while on Xarelto®. To help avoid this, consult your healthcare provider or pharmacist prior to using any new prescription or non-prescription medications, including herbals, vitamins, non-steroidal anti-inflammatory drugs (NSAIDs) and supplements. ° °This website has more information on Xarelto®: www.xarelto.com. ° ° ° °

## 2015-02-13 NOTE — Progress Notes (Signed)
CSW continuing to follow.  Pt [facility].  Pt not yet medically ready for discharge today.  [facility] confirmed that facility can accept pt over the weekend if pt becomes medically ready.  CSW to continue to follow.  Loletta SpecterSuzanna Kylii Ennis, MSW, LCSW Clinical Social Work Coverage for [employer] Haidinger, [place] [phone]

## 2015-02-14 DIAGNOSIS — M25559 Pain in unspecified hip: Secondary | ICD-10-CM | POA: Diagnosis not present

## 2015-02-14 DIAGNOSIS — Z4789 Encounter for other orthopedic aftercare: Secondary | ICD-10-CM | POA: Diagnosis not present

## 2015-02-14 DIAGNOSIS — R600 Localized edema: Secondary | ICD-10-CM | POA: Diagnosis not present

## 2015-02-14 DIAGNOSIS — M25561 Pain in right knee: Secondary | ICD-10-CM | POA: Diagnosis not present

## 2015-02-14 DIAGNOSIS — T814XXA Infection following a procedure, initial encounter: Secondary | ICD-10-CM | POA: Diagnosis not present

## 2015-02-14 DIAGNOSIS — Z471 Aftercare following joint replacement surgery: Secondary | ICD-10-CM | POA: Diagnosis not present

## 2015-02-14 DIAGNOSIS — Z96651 Presence of right artificial knee joint: Secondary | ICD-10-CM | POA: Diagnosis not present

## 2015-02-14 DIAGNOSIS — K5900 Constipation, unspecified: Secondary | ICD-10-CM | POA: Diagnosis not present

## 2015-02-14 DIAGNOSIS — R278 Other lack of coordination: Secondary | ICD-10-CM | POA: Diagnosis not present

## 2015-02-14 DIAGNOSIS — E559 Vitamin D deficiency, unspecified: Secondary | ICD-10-CM | POA: Diagnosis not present

## 2015-02-14 DIAGNOSIS — I5032 Chronic diastolic (congestive) heart failure: Secondary | ICD-10-CM | POA: Diagnosis not present

## 2015-02-14 DIAGNOSIS — I10 Essential (primary) hypertension: Secondary | ICD-10-CM | POA: Diagnosis not present

## 2015-02-14 DIAGNOSIS — F329 Major depressive disorder, single episode, unspecified: Secondary | ICD-10-CM | POA: Diagnosis not present

## 2015-02-14 DIAGNOSIS — M1711 Unilateral primary osteoarthritis, right knee: Secondary | ICD-10-CM | POA: Diagnosis not present

## 2015-02-14 DIAGNOSIS — R400 Somnolence: Secondary | ICD-10-CM | POA: Diagnosis not present

## 2015-02-14 DIAGNOSIS — D62 Acute posthemorrhagic anemia: Secondary | ICD-10-CM | POA: Diagnosis not present

## 2015-02-14 DIAGNOSIS — F331 Major depressive disorder, recurrent, moderate: Secondary | ICD-10-CM | POA: Diagnosis not present

## 2015-02-14 DIAGNOSIS — Z8673 Personal history of transient ischemic attack (TIA), and cerebral infarction without residual deficits: Secondary | ICD-10-CM | POA: Diagnosis not present

## 2015-02-14 DIAGNOSIS — J302 Other seasonal allergic rhinitis: Secondary | ICD-10-CM | POA: Diagnosis not present

## 2015-02-14 DIAGNOSIS — J309 Allergic rhinitis, unspecified: Secondary | ICD-10-CM | POA: Diagnosis not present

## 2015-02-14 DIAGNOSIS — N3000 Acute cystitis without hematuria: Secondary | ICD-10-CM | POA: Diagnosis not present

## 2015-02-14 DIAGNOSIS — E785 Hyperlipidemia, unspecified: Secondary | ICD-10-CM | POA: Diagnosis not present

## 2015-02-14 DIAGNOSIS — R2681 Unsteadiness on feet: Secondary | ICD-10-CM | POA: Diagnosis not present

## 2015-02-14 DIAGNOSIS — M6281 Muscle weakness (generalized): Secondary | ICD-10-CM | POA: Diagnosis not present

## 2015-02-14 DIAGNOSIS — M7989 Other specified soft tissue disorders: Secondary | ICD-10-CM | POA: Diagnosis not present

## 2015-02-14 LAB — CBC
HEMATOCRIT: 34.2 % — AB (ref 36.0–46.0)
HEMOGLOBIN: 11.2 g/dL — AB (ref 12.0–15.0)
MCH: 30.2 pg (ref 26.0–34.0)
MCHC: 32.7 g/dL (ref 30.0–36.0)
MCV: 92.2 fL (ref 78.0–100.0)
Platelets: 207 10*3/uL (ref 150–400)
RBC: 3.71 MIL/uL — AB (ref 3.87–5.11)
RDW: 14.2 % (ref 11.5–15.5)
WBC: 11.3 10*3/uL — AB (ref 4.0–10.5)

## 2015-02-14 NOTE — Progress Notes (Signed)
[facility]

## 2015-02-14 NOTE — Clinical Social Work Placement (Signed)
   CLINICAL SOCIAL WORK PLACEMENT  NOTE  Date:  02/14/2015  Patient Details  Name: Kristin Wilkerson MRN: [id number] Date of Birth: [birth year]  Clinical Social Work is seeking post-discharge placement for this patient at the Skilled  Nursing Facility level of care (*CSW will initial, date and re-position this form in  chart as items are completed):  No   Patient/family provided with [facility] Clinical Social Work Department's list of facilities offering this level of care within the geographic area requested by the patient (or if unable, by the patient's family).  Yes   Patient/family informed of their freedom to choose among providers that offer the needed level of care, that participate in Medicare, Medicaid or managed care program needed by the patient, have an available bed and are willing to accept the patient.  No   Patient/family informed of [facility]'s ownership interest in [facility] and [facility], as well as of the fact that they are under no obligation to receive care at these facilities.  PASRR submitted to EDS on 02/11/15     PASRR number received on       Existing PASRR number confirmed on       FL2 transmitted to all facilities in geographic area requested by pt/family on       FL2 transmitted to all facilities within larger geographic area on       Patient informed that his/her managed care company has contracts with or will negotiate with certain facilities, including the following:        Yes   Patient/family informed of bed offers received.  [facility]     Physician recommends and patient [facility]    [facility] on  .February 14, 2015  Patient to be transferred to facility by   ambulance    Patient family notified on  February 14, 2015  of transfer.  Name of family member notified:    Kristin Wilkerson/daughter    PHYSICIAN       Additional Comment:     _______________________________________________ Annetta MawKujawa,Westyn Keatley G, LCSW 02/14/2015, 10:49 AM

## 2015-02-14 NOTE — Progress Notes (Signed)
Subjective: 3 Days Post-Op Procedure(s) (LRB): TOTAL KNEE ARTHROPLASTY (Right) Patient reports pain as 1 on 0-10 scale. Less confused today. She knows who I am and her sons name. No signs of any significan neurological problems. [facility].   Objective: Vital signs in last 24 hours: Temp:  [98.4 F (36.9 C)-98.9 F (37.2 C)] 98.9 F (37.2 C) (10/28 2153) Pulse Rate:  [79-110] 110 (10/28 2153) Resp:  [19-24] 24 (10/28 2153) BP: (146-183)/(73-91) 165/91 mmHg (10/28 2153) SpO2:  [92 %-95 %] 94 % (10/28 2153)  Intake/Output from previous day: 10/28 0701 - 10/29 0700 In: 0  Out: 150 [Urine:150] Intake/Output this shift:     Recent Labs  02/12/15 0555 02/13/15 1118 02/14/15 0615  HGB 11.1* 11.8* 11.2*    Recent Labs  02/13/15 1118 02/14/15 0615  WBC 12.3* 11.3*  RBC 3.78* 3.71*  HCT 35.7* 34.2*  PLT 221 207    Recent Labs  02/12/15 0555 02/13/15 1118  NA 136 139  K 4.1 3.7  CL 102 99*  CO2 26 31  BUN 14 13  CREATININE 0.78 0.82  GLUCOSE 140* 140*  CALCIUM 8.8* 8.9   No results for input(s): LABPT, INR in the last 72 hours.  Compartment soft  Assessment/Plan: 3 Days Post-Op Procedure(s) (LRB): TOTAL KNEE ARTHROPLASTY (Right) Up with therapy Discharge to SNF  Charels Stambaugh A 02/14/2015, 7:20 AM

## 2015-02-14 NOTE — Plan of Care (Signed)
Problem: Consults Goal: Diagnosis- Total Joint Replacement Outcome: Completed/Met Date Met:  02/14/15 Primary Total Knee RIGHT  Problem: Phase III Progression Outcomes Goal: Anticoagulant follow-up in place Outcome: Not Applicable Date Met:  50/38/88 Xarelto VTE, no f/u needed.

## 2015-02-15 LAB — URINE CULTURE: Special Requests: NORMAL

## 2015-02-16 ENCOUNTER — Encounter: Payer: Self-pay | Admitting: Adult Health

## 2015-02-16 ENCOUNTER — Non-Acute Institutional Stay (SKILLED_NURSING_FACILITY): Payer: Medicare Other | Admitting: Adult Health

## 2015-02-16 DIAGNOSIS — E559 Vitamin D deficiency, unspecified: Secondary | ICD-10-CM | POA: Diagnosis not present

## 2015-02-16 DIAGNOSIS — F331 Major depressive disorder, recurrent, moderate: Secondary | ICD-10-CM

## 2015-02-16 DIAGNOSIS — Z8673 Personal history of transient ischemic attack (TIA), and cerebral infarction without residual deficits: Secondary | ICD-10-CM | POA: Diagnosis not present

## 2015-02-16 DIAGNOSIS — I10 Essential (primary) hypertension: Secondary | ICD-10-CM

## 2015-02-16 DIAGNOSIS — E785 Hyperlipidemia, unspecified: Secondary | ICD-10-CM

## 2015-02-16 DIAGNOSIS — I5032 Chronic diastolic (congestive) heart failure: Secondary | ICD-10-CM | POA: Diagnosis not present

## 2015-02-16 DIAGNOSIS — J309 Allergic rhinitis, unspecified: Secondary | ICD-10-CM | POA: Diagnosis not present

## 2015-02-16 DIAGNOSIS — K5900 Constipation, unspecified: Secondary | ICD-10-CM

## 2015-02-16 DIAGNOSIS — M1711 Unilateral primary osteoarthritis, right knee: Secondary | ICD-10-CM

## 2015-02-16 NOTE — Progress Notes (Signed)
Patient ID: Kristin Wilkerson, female   DOB: [birth year], 81 y.o.   MRN: [id number]    DATE:  02/16/2015   MRN:  [id number]  BIRTHDAY: December 05, 1925  Facility:  Nursing Home Location:  [facility]  Nursing Home Room Number: 1201-P  LEVEL OF CARE:  SNF [phone])  Contact Information    Name Relation Home Work [place] Son [phone]  [phone]   Hargens,[name] Daughter [phone]         Chief Complaint  Patient presents with  . Hospitalization Follow-up    Osteoarthritis S/P right total knee arthroplasty, hypertension, chronic diastolic heart failure, depression, history of stroke, allergic rhinitis, hyperlipidemia and vitamin D deficiency    HISTORY OF PRESENT ILLNESS:  This is an 81 year old female who has been [facility] to [facility] on 02/14/15 from [facility]. She has PMH of hypertension, sciatica, lumbago, unspecified vitamin D deficiency, anxiety, hyperlipidemia, depression, glaucoma, reflux esophagitis, osteoporosis and benign neoplasm of colon. She has osteoarthritis of right knee for which she had right total knee arthroplasty    PAST MEDICAL HISTORY:  Past Medical History  Diagnosis Date  . Hypertension   . Abnormality of gait   . Disturbance of skin sensation   . Tachycardia, unspecified   . Internal hemorrhoids without mention of complication   . Synovial cyst of popliteal space   . Spinal stenosis, other region   . Sciatica   . Lumbago   . Unspecified vitamin D deficiency   . Anxiety   . Hyperlipidemia   . Abnormality of gait   . Unspecified sinusitis (chronic)   . Unspecified pruritic disorder   . Pain in joint, lower leg   . Depressive disorder, not elsewhere classified   . Unspecified glaucoma   . Reflux esophagitis   . Diverticulosis of colon (without mention of hemorrhage)   . Pain in joint, site unspecified   . Myalgia and myositis, unspecified   . Osteoporosis, unspecified   . Other malaise and fatigue     . Cervicalgia   . Benign neoplasm of colon   . H/O echocardiogram 09/01/11    EF >55%, mild MR, RV systolic pressure elevated at 02-72 mmHg, mod TR  . Normal cardiac stress test 11/16/09    low risk scan, EF  77%  . Shortness of breath   . Stroke [facility]) "a couple of years ago"     visual problems left eye  . Arthritis   . Swelling of both ankles     rt leg  . Bruises easily      CURRENT MEDICATIONS: Reviewed  Patient's Medications  New Prescriptions   No medications on file  Previous Medications   ACETAMINOPHEN (TYLENOL) 500 MG TABLET    Take 1,000 mg by mouth daily as needed for moderate pain.   ATORVASTATIN (LIPITOR) 20 MG TABLET    TAKE 1 TABLET BY MOUTH EVERY EVENING TO CONTROL CHOLESTEROL   CALCIUM CARBONATE-VITAMIN D (CALCIUM 600+D) 600-400 MG-UNIT PER TABLET    Take one tablet by mouth daily for calcium supplement   CETIRIZINE (ZYRTEC) 10 MG TABLET    Take 1 tablet by mouth daily.   CHOLECALCIFEROL (VITAMIN D) 1000 UNITS TABLET    Take 1,000 Units by mouth daily.    CLOPIDOGREL (PLAVIX) 75 MG TABLET    TAKE 1 TABLET BY MOUTH EVERY MORNING FOR ANTICOAGULATION   CLOTRIMAZOLE (LOTRIMIN) 1 % CREAM    Apply twice daily to rash until resolved   DULOXETINE (  CYMBALTA) 30 MG CAPSULE    One daily to help nerves   FUROSEMIDE (LASIX) 20 MG TABLET    Take one every other day for diuretic       HYDROCODONE-ACETAMINOPHEN (NORCO/VICODIN) 5-325 MG TABLET    Take 1-2 tablets by mouth every 4 (four) hours as needed (breakthrough pain).   LATANOPROST (XALATAN) 0.005 % OPHTHALMIC SOLUTION    INSTILL 1 DROP TO BOTH EYES AT BEDTIME TO TREAT GLAUCOMA   LOSARTAN (COZAAR) 100 MG TABLET    TAKE 1 TABLET BY MOUTH ONCE DAILY FOR BLOOD PRESSURE   METOPROLOL (LOPRESSOR) 100 MG TABLET    TAKE 1 TABLET BY MOUTH TWICE DAILY TO CONTORL BLOOD PRESSURE   TRAMADOL (ULTRAM) 50 MG TABLET    Take 1-2 tablets (50-100 mg total) by mouth every 6 (six) hours as needed for moderate pain.  Modified Medications   No  medications on file  Discontinued Medications   No medications on file     No Known Allergies   REVIEW OF SYSTEMS:  GENERAL: no change in appetite, no fatigue, no weight changes, no fever, chills or weakness EYES: Denies change in vision, dry eyes, eye pain, itching or discharge EARS: Denies change in hearing, ringing in ears, or earache NOSE: Denies nasal congestion or epistaxis MOUTH and THROAT: Denies oral discomfort, gingival pain or bleeding, pain from teeth or hoarseness   RESPIRATORY: no cough, SOB, DOE, wheezing, hemoptysis CARDIAC: no chest pain, or palpitations, +edema GI: no abdominal pain, diarrhea, heart burn, nausea or vomiting, +constipation GU: Denies dysuria, frequency, hematuria, incontinence, or discharge PSYCHIATRIC: Denies feeling of depression or anxiety. No report of hallucinations, insomnia, paranoia, or agitation   PHYSICAL EXAMINATION  GENERAL APPEARANCE: Well nourished. In no acute distress. Normal body habitus SKIN:  Right knee surgical incision is covered with Aquacel dressing, no erythema  HEAD: Normal in size and contour. No evidence of trauma EYES: Lids open and close normally. No blepharitis, entropion or ectropion. PERRL. Conjunctivae are clear and sclerae are white. Lenses are without opacity EARS: Pinnae are normal. Patient hears normal voice tunes of the examiner MOUTH and THROAT: Lips are without lesions. Oral mucosa is moist and without lesions. Tongue is normal in shape, size, and color and without lesions NECK: supple, trachea midline, no neck masses, no thyroid tenderness, no thyromegaly LYMPHATICS: no LAN in the neck, no supraclavicular LAN RESPIRATORY: breathing is even & unlabored, BS CTAB CARDIAC: RRR, no murmur,no extra heart sounds, RLE edema 1+ GI: abdomen soft, normal BS, no masses, no tenderness, no hepatomegaly, no splenomegaly EXTREMITIES: Able to move 4 extremities PSYCHIATRIC: Alert and oriented X 3. Affect and behavior are  appropriate  LABS/RADIOLOGY: Labs reviewed: Basic Metabolic Panel:  Recent Labs  81/19/14 1525 02/12/15 0555 02/13/15 1118  NA 141 136 139  K 3.9 4.1 3.7  CL 103 102 99*  CO2 GLUCOSE 101* 140* 140*  BUN CREATININE 0.90 0.78 0.82  CALCIUM 9.6 8.8* 8.9   Liver Function Tests:  Recent Labs  02/21/14 1046 02/03/15 1525  AST 20 26  ALT 12 21  ALKPHOS 69 73  BILITOT 0.7 0.7  PROT 6.7 7.0  ALBUMIN 4.4 4.2   CBC:  Recent Labs  04/25/14 1307 02/03/15 1525 02/12/15 0555 02/13/15 1118 02/14/15 0615  WBC 5.1 6.3 12.9* 12.3* 11.3*  NEUTROABS 2.8 3.5  --   --   --   HGB 13.1 13.3 11.1* 11.8* 11.2*  HCT 40.2 41.0 34.2* 35.7*  34.2*  MCV 94.4 93.8 93.4 94.4 92.2  PLT 203 197 206 221 207   Lipid Panel:  Recent Labs  02/21/14 1046  HDL 46    ASSESSMENT/PLAN:  Osteoarthritis S/P right total knee arthroplasty - for rehabilitation; continue Ultram 50 mg 1-2 tabs by mouth every 6 hours when necessary and Norco 5/325 mg 1-2 tabs by mouth every 4 hours when necessary and Tylenol extra strength 500 mg 2 caplets by mouth daily when necessary; follow-up with Dr. [name], orthopedic surgeon, in 2 weeks; check CBC  Hypertension - continue Cozaar 100 mg 1 tab by mouth daily and Lopressor 100 mg by mouth twice a day  Chronic diastolic CHF - stable; continue Lasix 20 mg 1 tab by mouth every other day; check BMP  Depression - mood is stable; continue Cymbalta 30 mg 1 capsule by mouth daily  History of stroke - continue Plavix 75 mg 1 tab by mouth every morning  Allergic rhinitis - continue Zyrtec 10 mg 1 tab by mouth daily  Hyperlipidemia - continue Lipitor 20 mg 1 tab by mouth daily evening  Vitamin D deficiency - continue vitamin D3 1000 units 1 tab by mouth daily  Constipation -  start senna S2 tabs by mouth twice a day and MiraLAX 17 g by mouth twice a day     Goals of care:  Short-term rehabilitation    [facility]VARG[facility],MONINA, NP [facility] [phone]

## 2015-02-17 LAB — CBC AND DIFFERENTIAL
HEMATOCRIT: 33 % — AB (ref 36–46)
HEMOGLOBIN: 10.6 g/dL — AB (ref 12.0–16.0)
PLATELETS: 295 10*3/uL (ref 150–399)
WBC: 9.1 10^3/mL

## 2015-02-17 LAB — BASIC METABOLIC PANEL
BUN: 32 mg/dL — AB (ref 4–21)
CREATININE: 0.9 mg/dL (ref 0.5–1.1)
Glucose: 84 mg/dL
Potassium: 3.8 mmol/L (ref 3.4–5.3)
Sodium: 143 mmol/L (ref 137–147)

## 2015-02-20 ENCOUNTER — Encounter: Payer: Self-pay | Admitting: Internal Medicine

## 2015-02-20 ENCOUNTER — Non-Acute Institutional Stay (SKILLED_NURSING_FACILITY): Payer: Medicare Other | Admitting: Internal Medicine

## 2015-02-20 DIAGNOSIS — M1711 Unilateral primary osteoarthritis, right knee: Secondary | ICD-10-CM

## 2015-02-20 DIAGNOSIS — F32A Depression, unspecified: Secondary | ICD-10-CM

## 2015-02-20 DIAGNOSIS — J309 Allergic rhinitis, unspecified: Secondary | ICD-10-CM

## 2015-02-20 DIAGNOSIS — F329 Major depressive disorder, single episode, unspecified: Secondary | ICD-10-CM | POA: Diagnosis not present

## 2015-02-20 DIAGNOSIS — Z8673 Personal history of transient ischemic attack (TIA), and cerebral infarction without residual deficits: Secondary | ICD-10-CM

## 2015-02-20 DIAGNOSIS — N3000 Acute cystitis without hematuria: Secondary | ICD-10-CM | POA: Diagnosis not present

## 2015-02-20 DIAGNOSIS — K5900 Constipation, unspecified: Secondary | ICD-10-CM

## 2015-02-20 DIAGNOSIS — R2681 Unsteadiness on feet: Secondary | ICD-10-CM | POA: Diagnosis not present

## 2015-02-20 DIAGNOSIS — I10 Essential (primary) hypertension: Secondary | ICD-10-CM

## 2015-02-20 DIAGNOSIS — I5032 Chronic diastolic (congestive) heart failure: Secondary | ICD-10-CM | POA: Diagnosis not present

## 2015-02-20 DIAGNOSIS — E785 Hyperlipidemia, unspecified: Secondary | ICD-10-CM | POA: Diagnosis not present

## 2015-02-20 DIAGNOSIS — D62 Acute posthemorrhagic anemia: Secondary | ICD-10-CM

## 2015-02-20 NOTE — Progress Notes (Signed)
Patient ID: Kristin Wilkerson, female   DOB: [birth year], 81 y.o.   MRN: [id number]     [facility]  PCP: Kimber Relic, MD  Code Status: DNR  No Known Allergies  Chief Complaint  Patient presents with  . New Admit To SNF    New Admission      HPI:  81 y.o. patient is here for short term rehabilitation post hospital admission from 02/11/15-02/13/15 with primary OA of right knee. She underwent right total knee arthroplasty. She is seen in her room today. Her pain is under control with current pain regimen. Denies any concerns this visit. Currently she is being treated for UTI.  Review of Systems:  Constitutional: Negative for fever, chills, diaphoresis.  HENT: Negative for headache, congestion, nasal discharge Eyes: Negative for eye pain, blurred vision, double vision and discharge.  Respiratory: Negative for cough, shortness of breath and wheezing.   Cardiovascular: Negative for chest pain, palpitations, leg swelling.  Gastrointestinal: Negative for heartburn, nausea, vomiting, abdominal pain. Had bowel movement yesterday Genitourinary: Negative for dysuria  Musculoskeletal: Negative for back pain, falls Skin: Negative for itching, rash.  Neurological: Negative for dizziness Psychiatric/Behavioral: Negative for depression   Past Medical History  Diagnosis Date  . Hypertension   . Abnormality of gait   . Disturbance of skin sensation   . Tachycardia, unspecified   . Internal hemorrhoids without mention of complication   . Synovial cyst of popliteal space   . Spinal stenosis, other region   . Sciatica   . Lumbago   . Unspecified vitamin D deficiency   . Anxiety   . Hyperlipidemia   . Abnormality of gait   . Unspecified sinusitis (chronic)   . Unspecified pruritic disorder   . Pain in joint, lower leg   . Depressive disorder, not elsewhere classified   . Unspecified glaucoma   . Reflux esophagitis   . Diverticulosis of colon (without mention of  hemorrhage)   . Pain in joint, site unspecified   . Myalgia and myositis, unspecified   . Osteoporosis, unspecified   . Other malaise and fatigue   . Cervicalgia   . Benign neoplasm of colon   . H/O echocardiogram 09/01/11    EF >55%, mild MR, RV systolic pressure elevated at 40-98 mmHg, mod TR  . Normal cardiac stress test 11/16/09    low risk scan, EF  77%  . Shortness of breath   . Stroke [facility]) "a couple of years ago"     visual problems left eye  . Arthritis   . Swelling of both ankles     rt leg  . Bruises easily    Past Surgical History  Procedure Laterality Date  . Lumbar laminectomy  12/04/2012    Complete decompressive L3-4 and L4-5--Dr. Darrelyn Hillock  . Abdominal hysterectomy    . Cholecystectomy    . Cardiovascular stress test  11/16/2009    Perfusion defect seen in inferior myocardial region consistent with diaphragmatic attenuation. Remaining myocardium demonstrates normalmyocardial perfusion with no evidence of ischemia or infarct. No ECG changes. EKG negative for ischemia.  . Transthoracic echocardiogram  09/01/2011    EF >55% and moderate tricuspid valve regurg.  . Bilateral oophorectomy    . Total knee arthroplasty Right 02/11/2015    Procedure: TOTAL KNEE ARTHROPLASTY;  Surgeon: Ranee Gosselin, MD;  Location: [facility];  Service: Orthopedics;  Laterality: Right;   Social History:   reports that she has never smoked. She does not have any  smokeless tobacco history on file. She reports that she does not drink alcohol or use illicit drugs.  Family History  Problem Relation Age of Onset  . Cancer Sister   . Stroke Mother   . Stroke Father     Medications:   Medication List       This list is accurate as of: 02/20/15  2:15 PM.  Always use your most recent med list.               acetaminophen 500 MG tablet  Commonly known as:  TYLENOL  Take 1,000 mg by mouth daily as needed for moderate pain.     atorvastatin 20 MG tablet  Commonly known as:  LIPITOR  TAKE 1  TABLET BY MOUTH EVERY EVENING TO CONTROL CHOLESTEROL     Calcium Carbonate-Vitamin D 600-400 MG-UNIT tablet  Commonly known as:  CALCIUM 600+D  Take one tablet by mouth daily for calcium supplement     cetirizine 10 MG tablet  Commonly known as:  ZYRTEC  Take 1 tablet by mouth daily.     cholecalciferol 1000 UNITS tablet  Commonly known as:  VITAMIN D  Take 1,000 Units by mouth daily.     ciprofloxacin 500 MG tablet  Commonly known as:  CIPRO  Take 500 mg by mouth 2 (two) times daily. X 7 days for UTI, end 02/23/15     clopidogrel 75 MG tablet  Commonly known as:  PLAVIX  TAKE 1 TABLET BY MOUTH EVERY MORNING FOR ANTICOAGULATION     clotrimazole 1 % cream  Commonly known as:  LOTRIMIN  Apply twice daily to rash until resolved     DULoxetine 30 MG capsule  Commonly known as:  CYMBALTA  One daily to help nerves     furosemide 20 MG tablet  Commonly known as:  LASIX  Take one every other day for diuretic     HYDROcodone-acetaminophen 5-325 MG tablet  Commonly known as:  NORCO/VICODIN  Take 1-2 tablets by mouth every 4 (four) hours as needed (breakthrough pain).     latanoprost 0.005 % ophthalmic solution  Commonly known as:  XALATAN  INSTILL 1 DROP TO BOTH EYES AT BEDTIME TO TREAT GLAUCOMA     losartan 100 MG tablet  Commonly known as:  COZAAR  TAKE 1 TABLET BY MOUTH ONCE DAILY FOR BLOOD PRESSURE     metoprolol 100 MG tablet  Commonly known as:  LOPRESSOR  TAKE 1 TABLET BY MOUTH TWICE DAILY TO CONTORL BLOOD PRESSURE     polyethylene glycol packet  Commonly known as:  MIRALAX / GLYCOLAX  Take 17 g by mouth 2 (two) times daily.     sennosides-docusate sodium 8.6-50 MG tablet  Commonly known as:  SENOKOT-S  Take 2 tablets by mouth 2 (two) times daily.     traMADol 50 MG tablet  Commonly known as:  ULTRAM  Take 1-2 tablets (50-100 mg total) by mouth every 6 (six) hours as needed for moderate pain.         Physical Exam: Filed Vitals:   02/20/15 1404  BP:  102/65  Pulse: 75  Temp: 97 F (36.1 C)  TempSrc: Oral  Resp: 16  SpO2: 96%    General- elderly female, in no acute distress Head- normocephalic, atraumatic Nose- normal nasal mucosa, no maxillary or frontal sinus tenderness, no nasal discharge Throat- moist mucus membrane Eyes- PERRLA, EOMI, no pallor, no icterus, no discharge, normal conjunctiva, normal sclera Neck- no cervical lymphadenopathy Cardiovascular- normal s1,s2,  no murmurs, right leg edema + Respiratory- bilateral clear to auscultation, no wheeze, no rhonchi, no crackles, no use of accessory muscles Abdomen- bowel sounds present, soft, non tender Musculoskeletal- able to move all 4 extremities, limited right knee range of motion  Neurological- no focal deficit, alert and oriented  Skin- warm and dry, right knee surgical incision with mild erythema, incision healing well Psychiatry- normal mood and affect    Labs reviewed: Basic Metabolic Panel:  Recent Labs  16/01/9609/18/16 1525 02/12/15 0555 02/13/15 1118 02/17/15  NA 141 136 139 143  K 3.9 4.1 3.7 3.8  CL 103 102 99*  --   CO2 31 26 31   --   GLUCOSE 101* 140* 140*  --   BUN 19 14 13  32*  CREATININE 0.90 0.78 0.82 0.9  CALCIUM 9.6 8.8* 8.9  --    Liver Function Tests:  Recent Labs  02/21/14 1046 02/03/15 1525  AST 20 26  ALT 12 21  ALKPHOS 69 73  BILITOT 0.7 0.7  PROT 6.7 7.0  ALBUMIN 4.4 4.2   No results for input(s): LIPASE, AMYLASE in the last 8760 hours. No results for input(s): AMMONIA in the last 8760 hours. CBC:  Recent Labs  04/25/14 1307 02/03/15 1525 02/12/15 0555 02/13/15 1118 02/14/15 0615 02/17/15  WBC 5.1 6.3 12.9* 12.3* 11.3* 9.1  NEUTROABS 2.8 3.5  --   --   --   --   HGB 13.1 13.3 11.1* 11.8* 11.2* 10.6*  HCT 40.2 41.0 34.2* 35.7* 34.2* 33*  MCV 94.4 93.8 93.4 94.4 92.2  --   PLT 203 197 206 221 207 295     Assessment/Plan  Unsteady gait Will have her work with physical therapy and occupational therapy team to help  with gait training and muscle strengthening exercises.fall precautions. Skin care. Encourage to be out of bed.   Right knee Osteoarthritis  S/P right total knee arthroplasty. Continue Ultram 50 mg 1-2 tabs q6h prn with Norco 5/325 mg 1-2 tabs q4h prn pain. Has follow up with orthopedics. Currently not on any dvt prophylaxis. On review of hospital records, was getting xarelto in the hospital. Start xarelto 10 mg daily x 2 weeks for now. Continue calcium and vitamin d supplement. Add ted hose.  Blood loss anemia Monitor h&h.  Hypertension continue cozaar 100 mg daily and lopressor 100 mg bid and monitor BP  UTI Continue and complete 1 week course of ciprofloxacin 500 mg bid until 02/23/15  Chronic diastolic CHF continue cozaar 100 mg daily, lopressor 100 mg bid, lasix 20 mg 1qod, monitor weight and BMP  HLD Continue lipitor 20 mg daily  history of stroke Continue plavix 75 mg daily, lipitor 20 mg daily and BP medication  Constipation Continue senna s 2 tab bid, miralax 17 g bid.   Depression continue cymbalta 30 mg daily  Allergic rhinitis continue zyrtec 10 mg daily   Goals of care: short term rehabilitation   Labs/tests ordered: cbc with diff, cmp 02/23/15  Family/ staff Communication: reviewed care plan with patient and nursing supervisor    Oneal GroutMAHIMA Devonia Farro, MD  [facility] [phone] (Monday-Friday 8 am - 5 pm) [phone] (afterhours)

## 2015-02-21 ENCOUNTER — Other Ambulatory Visit: Payer: Self-pay | Admitting: Internal Medicine

## 2015-02-23 ENCOUNTER — Non-Acute Institutional Stay (SKILLED_NURSING_FACILITY): Payer: Medicare Other | Admitting: Internal Medicine

## 2015-02-23 ENCOUNTER — Encounter: Payer: Self-pay | Admitting: Internal Medicine

## 2015-02-23 DIAGNOSIS — R400 Somnolence: Secondary | ICD-10-CM | POA: Diagnosis not present

## 2015-02-23 DIAGNOSIS — IMO0001 Reserved for inherently not codable concepts without codable children: Secondary | ICD-10-CM

## 2015-02-23 DIAGNOSIS — R600 Localized edema: Secondary | ICD-10-CM | POA: Diagnosis not present

## 2015-02-23 DIAGNOSIS — T814XXA Infection following a procedure, initial encounter: Secondary | ICD-10-CM | POA: Diagnosis not present

## 2015-02-23 DIAGNOSIS — Z96651 Presence of right artificial knee joint: Secondary | ICD-10-CM | POA: Diagnosis not present

## 2015-02-23 NOTE — Progress Notes (Signed)
Patient ID: Kristin Bullocksnn T Clapp, female   DOB: [birth year], 81 y.o.   MRN: [id number]    [facility]  GREEN, Lenon CurtARTHUR G, MD  Code Status: DNR   Chief Complaint  Patient presents with  . Acute Visit    Right knee- possible infection     No Known Allergies   HPI:  81 y.o. patient is seen for acute concern, she had a fall yesterday and staff have noticed her knee area to be red and warm to touch with increased swelling. She is here for short term rehabilitation post right total knee arthroplasty. She is seen in her room today with her daughter present. She has been more sleepy per staff. She has completed her course of antibiotics for UTI today. Denies any urinary complaints. She grades her pain as 3/10 at present but pain is 7-9/10 with therapy and at night time.    Review of Systems:  Constitutional: Negative for fever, chills, diaphoresis.  HENT: Negative for headache, congestion, nasal discharge Eyes: Negative for eye pain, blurred vision, double vision and discharge.  Respiratory: Negative for cough, shortness of breath and wheezing.   Cardiovascular: Negative for chest pain, palpitations. Positive for leg swelling.  Gastrointestinal: Negative for heartburn, nausea, vomiting, abdominal pain. Had bowel movement yesterday Genitourinary: Negative for dysuria  Musculoskeletal: Negative for back pain Skin: Negative for itching, rash.  Neurological: Negative for dizziness Psychiatric/Behavioral: Negative for depression  Past Medical History  Diagnosis Date  . Hypertension   . Abnormality of gait   . Disturbance of skin sensation   . Tachycardia, unspecified   . Internal hemorrhoids without mention of complication   . Synovial cyst of popliteal space   . Spinal stenosis, other region   . Sciatica   . Lumbago   . Unspecified vitamin D deficiency   . Anxiety   . Hyperlipidemia   . Abnormality of gait   . Unspecified sinusitis (chronic)   . Unspecified pruritic  disorder   . Pain in joint, lower leg   . Depressive disorder, not elsewhere classified   . Unspecified glaucoma   . Reflux esophagitis   . Diverticulosis of colon (without mention of hemorrhage)   . Pain in joint, site unspecified   . Myalgia and myositis, unspecified   . Osteoporosis, unspecified   . Other malaise and fatigue   . Cervicalgia   . Benign neoplasm of colon   . H/O echocardiogram 09/01/11    EF >55%, mild MR, RV systolic pressure elevated at 40-9830-40 mmHg, mod TR  . Normal cardiac stress test 11/16/09    low risk scan, EF  77%  . Shortness of breath   . Stroke [facility] "a couple of years ago"     visual problems left eye  . Arthritis   . Swelling of both ankles     rt leg  . Bruises easily       Medication List       This list is accurate as of: 02/23/15 12:55 PM.  Always use your most recent med list.               acetaminophen 500 MG tablet  Commonly known as:  TYLENOL  Take 1,000 mg by mouth daily as needed for moderate pain.     atorvastatin 20 MG tablet  Commonly known as:  LIPITOR  TAKE 1 TABLET BY MOUTH EVERY EVENING TO CONTROL CHOLESTEROL     Calcium Carbonate-Vitamin D 600-400 MG-UNIT tablet  Commonly known  as:  CALCIUM 600+D  Take one tablet by mouth daily for calcium supplement     cetirizine 10 MG tablet  Commonly known as:  ZYRTEC  Take 1 tablet by mouth daily.     cholecalciferol 1000 UNITS tablet  Commonly known as:  VITAMIN D  Take 1,000 Units by mouth daily.     ciprofloxacin 500 MG tablet  Commonly known as:  CIPRO  Take 500 mg by mouth 2 (two) times daily. X 7 days for UTI, end 02/23/15     clopidogrel 75 MG tablet  Commonly known as:  PLAVIX  TAKE 1 TABLET BY MOUTH EVERY MORNING FOR ANTICOAGULATION     clotrimazole 1 % cream  Commonly known as:  LOTRIMIN  Apply twice daily to rash until resolved     DULoxetine 30 MG capsule  Commonly known as:  CYMBALTA  One daily to help nerves     furosemide 20 MG tablet  Commonly  known as:  LASIX  Take one every other day for diuretic     HYDROcodone-acetaminophen 5-325 MG tablet  Commonly known as:  NORCO/VICODIN  Take 1-2 tablets by mouth every 4 (four) hours as needed (breakthrough pain).     latanoprost 0.005 % ophthalmic solution  Commonly known as:  XALATAN  INSTILL 1 DROP TO BOTH EYES AT BEDTIME TO TREAT GLAUCOMA     losartan 100 MG tablet  Commonly known as:  COZAAR  TAKE 1 TABLET BY MOUTH ONCE DAILY FOR BLOOD PRESSURE     metoprolol 100 MG tablet  Commonly known as:  LOPRESSOR  TAKE 1 TABLET BY MOUTH TWICE DAILY TO CONTORL BLOOD PRESSURE     polyethylene glycol packet  Commonly known as:  MIRALAX / GLYCOLAX  Take 17 g by mouth 2 (two) times daily.     rivaroxaban 10 MG Tabs tablet  Commonly known as:  XARELTO  Take 10 mg by mouth daily. X 2 weeks, end 03/06/15     sennosides-docusate sodium 8.6-50 MG tablet  Commonly known as:  SENOKOT-S  Take 2 tablets by mouth 2 (two) times daily.     traMADol 50 MG tablet  Commonly known as:  ULTRAM  Take 1-2 tablets (50-100 mg total) by mouth every 6 (six) hours as needed for moderate pain.         Physical exam BP 132/74 mmHg  Pulse 74  Temp(Src) 98.3 F (36.8 C) (Oral)  Resp 18  SpO2 98%  General- elderly female, in no acute distress Head- normocephalic, atraumatic Nose- normal nasal mucosa, no maxillary or frontal sinus tenderness, no nasal discharge Throat- moist mucus membrane Eyes- PERRLA, EOMI, no pallor, no icterus, no discharge, normal conjunctiva, normal sclera Neck- no cervical lymphadenopathy Cardiovascular- normal s1,s2, no murmurs, right leg edema 2+ edema upto thigh area Respiratory- bilateral clear to auscultation, no wheeze, no rhonchi, no crackles, no use of accessory muscles Abdomen- bowel sounds present, soft, non tender Musculoskeletal- able to move all 4 extremities, limited right knee range of motion with calf swelling present Neurological- no focal deficit, alert  and oriented to person, place and time Skin- warm and dry, right knee surgical incision with increased erythema, warmth present, tender to touch, no drainage noted Psychiatry- normal mood and affect  Assessment/plan  Right knee infection Start doxycycline 100 mg bid x 1 week. Continue skin care. Make appointment to see orthopedic ASAP to assess for possible joint infection. Cbc with diff in am  Right leg edema Asymmetrical. Has not been on any  anticoagulation for a week post surgery and was started on xarelto on 02/20/15. Edema has worsened since then. Will get venous doppler to rule out DVT  Somnolence Possible from infection along with narcotics. Change norco to 5-325 mg 1-2 tab po q6h prn from q4h prn for now  Right knee OA Change norco order to 5-325 mg bid at 9am and 9 pm with breakthrough dosing spaced out as above. Reassess. Fall precautions. Continue to work with PT and OT   Oneal Grout, MD  [facility] [phone] (Monday-Friday 8 am - 5 pm) [phone] (afterhours)

## 2015-02-24 DIAGNOSIS — Z96651 Presence of right artificial knee joint: Secondary | ICD-10-CM | POA: Diagnosis not present

## 2015-02-24 DIAGNOSIS — Z471 Aftercare following joint replacement surgery: Secondary | ICD-10-CM | POA: Diagnosis not present

## 2015-02-26 ENCOUNTER — Non-Acute Institutional Stay (SKILLED_NURSING_FACILITY): Payer: Medicare Other | Admitting: Adult Health

## 2015-02-26 DIAGNOSIS — I5032 Chronic diastolic (congestive) heart failure: Secondary | ICD-10-CM

## 2015-02-26 DIAGNOSIS — F331 Major depressive disorder, recurrent, moderate: Secondary | ICD-10-CM | POA: Diagnosis not present

## 2015-02-26 DIAGNOSIS — I10 Essential (primary) hypertension: Secondary | ICD-10-CM

## 2015-02-26 DIAGNOSIS — IMO0001 Reserved for inherently not codable concepts without codable children: Secondary | ICD-10-CM

## 2015-02-26 DIAGNOSIS — K5900 Constipation, unspecified: Secondary | ICD-10-CM

## 2015-02-26 DIAGNOSIS — M1711 Unilateral primary osteoarthritis, right knee: Secondary | ICD-10-CM

## 2015-02-26 DIAGNOSIS — Z8673 Personal history of transient ischemic attack (TIA), and cerebral infarction without residual deficits: Secondary | ICD-10-CM

## 2015-02-26 DIAGNOSIS — T814XXA Infection following a procedure, initial encounter: Secondary | ICD-10-CM

## 2015-02-26 DIAGNOSIS — E785 Hyperlipidemia, unspecified: Secondary | ICD-10-CM

## 2015-02-26 DIAGNOSIS — E559 Vitamin D deficiency, unspecified: Secondary | ICD-10-CM

## 2015-03-01 ENCOUNTER — Observation Stay (HOSPITAL_COMMUNITY)
Admission: EM | Admit: 2015-03-01 | Discharge: 2015-03-02 | Disposition: A | Discharge status: Skilled Nursing Facility | Payer: Medicare Other | Attending: Orthopedic Surgery | Admitting: Orthopedic Surgery

## 2015-03-01 ENCOUNTER — Emergency Department (HOSPITAL_COMMUNITY): Payer: Medicare Other

## 2015-03-01 DIAGNOSIS — F329 Major depressive disorder, single episode, unspecified: Secondary | ICD-10-CM | POA: Diagnosis not present

## 2015-03-01 DIAGNOSIS — S8001XA Contusion of right knee, initial encounter: Secondary | ICD-10-CM | POA: Insufficient documentation

## 2015-03-01 DIAGNOSIS — I10 Essential (primary) hypertension: Secondary | ICD-10-CM | POA: Diagnosis not present

## 2015-03-01 DIAGNOSIS — T8131XA Disruption of external operation (surgical) wound, not elsewhere classified, initial encounter: Principal | ICD-10-CM | POA: Insufficient documentation

## 2015-03-01 DIAGNOSIS — F0390 Unspecified dementia without behavioral disturbance: Secondary | ICD-10-CM | POA: Diagnosis not present

## 2015-03-01 DIAGNOSIS — R0902 Hypoxemia: Secondary | ICD-10-CM | POA: Diagnosis not present

## 2015-03-01 DIAGNOSIS — H539 Unspecified visual disturbance: Secondary | ICD-10-CM | POA: Diagnosis not present

## 2015-03-01 DIAGNOSIS — Z7902 Long term (current) use of antithrombotics/antiplatelets: Secondary | ICD-10-CM | POA: Diagnosis not present

## 2015-03-01 DIAGNOSIS — S81001A Unspecified open wound, right knee, initial encounter: Secondary | ICD-10-CM | POA: Diagnosis not present

## 2015-03-01 DIAGNOSIS — Z79899 Other long term (current) drug therapy: Secondary | ICD-10-CM | POA: Insufficient documentation

## 2015-03-01 DIAGNOSIS — M25561 Pain in right knee: Secondary | ICD-10-CM | POA: Diagnosis not present

## 2015-03-01 DIAGNOSIS — K210 Gastro-esophageal reflux disease with esophagitis: Secondary | ICD-10-CM | POA: Insufficient documentation

## 2015-03-01 DIAGNOSIS — M1990 Unspecified osteoarthritis, unspecified site: Secondary | ICD-10-CM | POA: Insufficient documentation

## 2015-03-01 DIAGNOSIS — W19XXXA Unspecified fall, initial encounter: Secondary | ICD-10-CM | POA: Diagnosis not present

## 2015-03-01 DIAGNOSIS — Z96651 Presence of right artificial knee joint: Secondary | ICD-10-CM | POA: Insufficient documentation

## 2015-03-01 DIAGNOSIS — E785 Hyperlipidemia, unspecified: Secondary | ICD-10-CM | POA: Diagnosis not present

## 2015-03-01 DIAGNOSIS — Z7901 Long term (current) use of anticoagulants: Secondary | ICD-10-CM | POA: Insufficient documentation

## 2015-03-01 DIAGNOSIS — I69398 Other sequelae of cerebral infarction: Secondary | ICD-10-CM | POA: Insufficient documentation

## 2015-03-01 DIAGNOSIS — G8918 Other acute postprocedural pain: Secondary | ICD-10-CM | POA: Diagnosis not present

## 2015-03-01 LAB — BASIC METABOLIC PANEL
ANION GAP: 11 (ref 5–15)
BUN: 18 mg/dL (ref 6–20)
CO2: 23 mmol/L (ref 22–32)
Calcium: 9.1 mg/dL (ref 8.9–10.3)
Chloride: 107 mmol/L (ref 101–111)
Creatinine, Ser: 0.89 mg/dL (ref 0.44–1.00)
GFR calc Af Amer: >60 mL/min (ref >60)
GFR calc non Af Amer: 57 mL/min — ABNORMAL LOW (ref >60)
GLUCOSE: 111 mg/dL — AB (ref 65–99)
POTASSIUM: 3.2 mmol/L — AB (ref 3.5–5.1)
Sodium: 141 mmol/L (ref 135–145)

## 2015-03-01 LAB — CBC WITH DIFFERENTIAL/PLATELET
Basophils Absolute: 0 10*3/uL (ref 0.0–0.1)
Basophils Relative: 0 %
EOS PCT: 1 %
Eosinophils Absolute: 0.1 10*3/uL (ref 0.0–0.7)
HCT: 30.7 % — ABNORMAL LOW (ref 36.0–46.0)
Hemoglobin: 9.9 g/dL — ABNORMAL LOW (ref 12.0–15.0)
LYMPHS ABS: 1.3 10*3/uL (ref 0.7–4.0)
LYMPHS PCT: 13 %
MCH: 29.9 pg (ref 26.0–34.0)
MCHC: 32.2 g/dL (ref 30.0–36.0)
MCV: 92.7 fL (ref 78.0–100.0)
MONO ABS: 0.8 10*3/uL (ref 0.1–1.0)
MONOS PCT: 8 %
NEUTROS ABS: 7.9 10*3/uL — AB (ref 1.7–7.7)
Neutrophils Relative %: 78 %
PLATELETS: 474 10*3/uL — AB (ref 150–400)
RBC: 3.31 MIL/uL — ABNORMAL LOW (ref 3.87–5.11)
RDW: 14.7 % (ref 11.5–15.5)
WBC: 10.1 10*3/uL (ref 4.0–10.5)

## 2015-03-01 MED ORDER — ACETAMINOPHEN 500 MG PO TABS: 1000.0000 mg | ORAL_TABLET | Freq: Every day | ORAL | Status: DC | PRN

## 2015-03-01 MED ORDER — SODIUM CHLORIDE 0.9 % IJ SOLN: 3.0000 mL | INTRAMUSCULAR | Status: DC | PRN

## 2015-03-01 MED ORDER — ACETAMINOPHEN 650 MG RE SUPP: 650.0000 mg | Freq: Four times a day (QID) | RECTAL | Status: DC | PRN

## 2015-03-01 MED ORDER — LOSARTAN POTASSIUM 50 MG PO TABS
100.0000 mg | ORAL_TABLET | Freq: Every day | ORAL | Status: DC
  Administered 2015-03-02: 100 mg via ORAL
  Filled 2015-03-01: qty 2

## 2015-03-01 MED ORDER — POLYETHYLENE GLYCOL 3350 17 G PO PACK
17.0000 g | PACK | Freq: Every day | ORAL | Status: DC | PRN
  Filled 2015-03-01: qty 1

## 2015-03-01 MED ORDER — SODIUM CHLORIDE 0.9 % IJ SOLN
3.0000 mL | Freq: Two times a day (BID) | INTRAMUSCULAR | Status: DC
  Administered 2015-03-01 – 2015-03-02 (×2): 3 mL via INTRAVENOUS

## 2015-03-01 MED ORDER — FLEET ENEMA 7-19 GM/118ML RE ENEM: 1.0000 | ENEMA | Freq: Once | RECTAL | Status: DC | PRN

## 2015-03-01 MED ORDER — ALUM & MAG HYDROXIDE-SIMETH 200-200-20 MG/5ML PO SUSP: 30.0000 mL | Freq: Four times a day (QID) | ORAL | Status: DC | PRN

## 2015-03-01 MED ORDER — LATANOPROST 0.005 % OP SOLN
1.0000 [drp] | Freq: Every day | OPHTHALMIC | Status: DC
  Administered 2015-03-01: 1 [drp] via OPHTHALMIC
  Filled 2015-03-01: qty 2.5

## 2015-03-01 MED ORDER — METOPROLOL TARTRATE 100 MG PO TABS
100.0000 mg | ORAL_TABLET | Freq: Two times a day (BID) | ORAL | Status: DC
  Administered 2015-03-02: 100 mg via ORAL
  Filled 2015-03-01 (×3): qty 1

## 2015-03-01 MED ORDER — SODIUM CHLORIDE 0.9 % IV SOLN: 250.0000 mL | INTRAVENOUS | Status: DC | PRN

## 2015-03-01 MED ORDER — ONDANSETRON HCL 4 MG PO TABS: 4.0000 mg | ORAL_TABLET | Freq: Four times a day (QID) | ORAL | Status: DC | PRN

## 2015-03-01 MED ORDER — ATORVASTATIN CALCIUM 20 MG PO TABS
20.0000 mg | ORAL_TABLET | Freq: Every day | ORAL | Status: DC
  Filled 2015-03-01: qty 1

## 2015-03-01 MED ORDER — DOXYCYCLINE HYCLATE 100 MG PO TABS
100.0000 mg | ORAL_TABLET | Freq: Two times a day (BID) | ORAL | Status: DC
  Administered 2015-03-01 – 2015-03-02 (×3): 100 mg via ORAL
  Filled 2015-03-01 (×4): qty 1

## 2015-03-01 MED ORDER — BISACODYL 10 MG RE SUPP: 10.0000 mg | Freq: Every day | RECTAL | Status: DC | PRN

## 2015-03-01 MED ORDER — POVIDONE-IODINE 10 % EX SOLN
CUTANEOUS | Status: DC | PRN
  Filled 2015-03-01: qty 118

## 2015-03-01 MED ORDER — ACETAMINOPHEN 325 MG PO TABS
650.0000 mg | ORAL_TABLET | Freq: Four times a day (QID) | ORAL | Status: DC | PRN
Start: 2015-03-01 — End: 2015-03-02
  Administered 2015-03-01: 650 mg via ORAL
  Filled 2015-03-01: qty 2

## 2015-03-01 MED ORDER — POLYETHYLENE GLYCOL 3350 17 G PO PACK
17.0000 g | PACK | Freq: Two times a day (BID) | ORAL | Status: DC
  Administered 2015-03-01 – 2015-03-02 (×2): 17 g via ORAL

## 2015-03-01 MED ORDER — DULOXETINE HCL 30 MG PO CPEP
30.0000 mg | ORAL_CAPSULE | Freq: Every day | ORAL | Status: DC
  Administered 2015-03-02: 30 mg via ORAL
  Filled 2015-03-01: qty 1

## 2015-03-01 MED ORDER — ONDANSETRON HCL 4 MG/2ML IJ SOLN: 4.0000 mg | Freq: Four times a day (QID) | INTRAMUSCULAR | Status: DC | PRN

## 2015-03-01 MED ORDER — LACTATED RINGERS IV SOLN: INTRAVENOUS | Status: DC

## 2015-03-01 MED ORDER — SENNOSIDES-DOCUSATE SODIUM 8.6-50 MG PO TABS
2.0000 | ORAL_TABLET | Freq: Two times a day (BID) | ORAL | Status: DC
  Administered 2015-03-01 – 2015-03-02 (×2): 2 via ORAL
  Filled 2015-03-01 (×3): qty 2

## 2015-03-01 MED ORDER — CLOPIDOGREL BISULFATE 75 MG PO TABS
75.0000 mg | ORAL_TABLET | Freq: Every day | ORAL | Status: DC
  Filled 2015-03-01: qty 1

## 2015-03-01 MED ORDER — HYDROCODONE-ACETAMINOPHEN 5-325 MG PO TABS: 1.0000 | ORAL_TABLET | ORAL | Status: DC | PRN

## 2015-03-01 MED ORDER — FUROSEMIDE 20 MG PO TABS
20.0000 mg | ORAL_TABLET | Freq: Every day | ORAL | Status: DC
  Administered 2015-03-02: 20 mg via ORAL
  Filled 2015-03-01: qty 1

## 2015-03-01 NOTE — Care Management Note (Signed)
Case Management Note  Patient Details  Name: Kristin Wilkerson T Memmott MRN: [id number] Date of Birth: 04/26/1925  Subjective/Objective:     Right knee pain               Action/Plan: Family requesting pt dc back to [facility], SNF for continued rehab. Unable to provide the care she will need at home. CSW referral for SNF placement.   Expected Discharge Date:  03/02/2015               Expected Discharge Plan:  Skilled Nursing Facility  In-House Referral:  Clinical Social Work  Discharge planning Services  CM Consult    Status of Service:  Completed, signed off  Medicare Important Message Given:    Date Medicare IM Given:    Medicare IM give by:    Date Additional Medicare IM Given:    Additional Medicare Important Message give by:     If discussed at Long Length of Stay Meetings, dates discussed:    Additional Comments:  Elliot CousinShavis, Wilma Wuthrich Ellen, RN 03/01/2015, 3:14 PM

## 2015-03-01 NOTE — ED Provider Notes (Signed)
CSN: [id number]     Arrival date & time 03/01/15  1144 History   First MD Initiated Contact with Patient 03/01/15 1153     Chief Complaint  Patient presents with  . Knee Injury    right knee     The history is provided by the patient and medical records. No language interpreter was used.   Kristin Wilkerson is an 81 year old woman that presents for knee evaluation after knee replacement surgery on October 26. She was discharged after surgery to a rehabilitation facility and discharged home yesterday. Level V caveat due to confusion. She reports 3 days of draining and opening of her wound. She states that there is some swelling and pain to the leg. Pain is worse with range of motion. She states that she fell at some point in the last week and she cannot describe the fall or how she injured her knee and she is not sure if that happened before or after the knee began to open up. She is currently living at home with her family. She denies any fevers or generalized malaise. Per records from the rehabilitation facility she was started on doxycycline on October 7 for possible knee infection with planned outpatient orthopedics follow-up. In the medication she has at the bedside she has a prescription for Keflex but no doxycycline.  Past Medical History  Diagnosis Date  . Hypertension   . Abnormality of gait   . Disturbance of skin sensation   . Tachycardia, unspecified   . Internal hemorrhoids without mention of complication   . Synovial cyst of popliteal space   . Spinal stenosis, other region   . Sciatica   . Lumbago   . Unspecified vitamin D deficiency   . Anxiety   . Hyperlipidemia   . Abnormality of gait   . Unspecified sinusitis (chronic)   . Unspecified pruritic disorder   . Pain in joint, lower leg   . Depressive disorder, not elsewhere classified   . Unspecified glaucoma   . Reflux esophagitis   . Diverticulosis of colon (without mention of hemorrhage)   . Pain in joint, site  unspecified   . Myalgia and myositis, unspecified   . Osteoporosis, unspecified   . Other malaise and fatigue   . Cervicalgia   . Benign neoplasm of colon   . H/O echocardiogram 09/01/11    EF >55%, mild MR, RV systolic pressure elevated at 40-98 mmHg, mod TR  . Normal cardiac stress test 11/16/09    low risk scan, EF  77%  . Shortness of breath   . Stroke [facility]) "a couple of years ago"     visual problems left eye  . Arthritis   . Swelling of both ankles     rt leg  . Bruises easily    Past Surgical History  Procedure Laterality Date  . Lumbar laminectomy  12/04/2012    Complete decompressive L3-4 and L4-5--Dr. Darrelyn Hillock  . Abdominal hysterectomy    . Cholecystectomy    . Cardiovascular stress test  11/16/2009    Perfusion defect seen in inferior myocardial region consistent with diaphragmatic attenuation. Remaining myocardium demonstrates normalmyocardial perfusion with no evidence of ischemia or infarct. No ECG changes. EKG negative for ischemia.  . Transthoracic echocardiogram  09/01/2011    EF >55% and moderate tricuspid valve regurg.  . Bilateral oophorectomy    . Total knee arthroplasty Right 02/11/2015    Procedure: TOTAL KNEE ARTHROPLASTY;  Surgeon: Ranee Gosselin, MD;  Location: [facility];  Service:  Orthopedics;  Laterality: Right;   Family History  Problem Relation Age of Onset  . Cancer Sister   . Stroke Mother   . Stroke Father    Social History  Substance Use Topics  . Smoking status: Never Smoker   . Smokeless tobacco: Not on file  . Alcohol Use: No   OB History    No data available     Review of Systems  All other systems reviewed and are negative.     Allergies  Review of patient's allergies indicates no known allergies.  Home Medications   Prior to Admission medications   Medication Sig Start Date End Date Taking? Authorizing Provider  atorvastatin (LIPITOR) 20 MG tablet TAKE 1 TABLET BY MOUTH EVERY EVENING TO CONTROL CHOLESTEROL 06/23/14  Yes Kimber RelicArthur G  Green, MD  Calcium Carbonate-Vitamin D (CALCIUM 600+D) 600-400 MG-UNIT per tablet Take one tablet by mouth daily for calcium supplement Patient taking differently: Take 1 tablet by mouth daily.  07/02/13  Yes Kimber RelicArthur G Green, MD  cetirizine (ZYRTEC) 10 MG tablet Take 1 tablet by mouth daily. 11/26/14  Yes Historical Provider, MD  cholecalciferol (VITAMIN D) 1000 UNITS tablet Take 1,000 Units by mouth daily.    Yes Historical Provider, MD  clopidogrel (PLAVIX) 75 MG tablet TAKE 1 TABLET BY MOUTH EVERY MORNING FOR ANTICOAGULATION 06/23/14  Yes Kimber RelicArthur G Green, MD  DULoxetine (CYMBALTA) 30 MG capsule One daily to help nerves Patient taking differently: Take 30 mg by mouth daily. One daily to help nerves 01/07/14  Yes Kimber RelicArthur G Green, MD  furosemide (LASIX) 20 MG tablet Take one every other day for diuretic Patient taking differently: Take 20 mg by mouth daily.  09/24/14  Yes Kimber RelicArthur G Green, MD  HYDROcodone-acetaminophen (NORCO/VICODIN) 5-325 MG tablet Take 1-2 tablets by mouth every 4 (four) hours as needed (breakthrough pain). 02/13/15  Yes Amber Celedonio Savageonstable, PA-C  metoprolol (LOPRESSOR) 100 MG tablet TAKE 1 TABLET BY MOUTH TWICE DAILY TO CONTORL BLOOD PRESSURE 06/23/14  Yes Kimber RelicArthur G Green, MD  acetaminophen (TYLENOL) 500 MG tablet Take 1,000 mg by mouth daily as needed for moderate pain.    Historical Provider, MD  ciprofloxacin (CIPRO) 500 MG tablet Take 500 mg by mouth 2 (two) times daily. X 7 days for UTI, end 02/23/15    Historical Provider, MD  clotrimazole (LOTRIMIN) 1 % cream Apply twice daily to rash until resolved 09/24/14   Kimber RelicArthur G Green, MD  latanoprost (XALATAN) 0.005 % ophthalmic solution PLACE 1 DROP IN BOTH EYES AT BEDTIME TO TREAT GLAUCOMA 02/23/15   Kimber RelicArthur G Green, MD  losartan (COZAAR) 100 MG tablet TAKE 1 TABLET BY MOUTH ONCE DAILY FOR BLOOD PRESSURE 11/17/14   Kimber RelicArthur G Green, MD  polyethylene glycol (MIRALAX / Ethelene HalGLYCOLAX) packet Take 17 g by mouth 2 (two) times daily.    Historical Provider, MD   rivaroxaban (XARELTO) 10 MG TABS tablet Take 10 mg by mouth daily. X 2 weeks, end 03/06/15    Historical Provider, MD  sennosides-docusate sodium (SENOKOT-S) 8.6-50 MG tablet Take 2 tablets by mouth 2 (two) times daily.    Historical Provider, MD  traMADol (ULTRAM) 50 MG tablet Take 1-2 tablets (50-100 mg total) by mouth every 6 (six) hours as needed for moderate pain. 02/13/15   Amber Constable, PA-C   BP 172/92 mmHg  Pulse 58  Temp(Src) 97.6 F (36.4 C) (Oral)  Resp 15  Ht 5' (1.524 m)  Wt 160 lb (72.576 kg)  BMI 31.25 kg/m2  SpO2 100% Physical  Exam  Constitutional: She appears well-developed and well-nourished.  HENT:  Head: Normocephalic and atraumatic.  Cardiovascular: Normal rate and regular rhythm.   No murmur heard. Pulmonary/Chest: Effort normal and breath sounds normal. No respiratory distress.  Abdominal: Soft. There is no tenderness. There is no rebound and no guarding.  Musculoskeletal:  Right knee with mild diffuse swelling. Surgical wound with midline dehiscence over the patella, approx 4 cm long, approx 1 cm wide. No purulent drainage.  Mild surrounding erythema.  2+ DP pulses.  Decreased ROM in right knee secondary to pain.   Neurological: She is alert.  Disoriented to time, thinks it's 1916. Disoriented to place. She states she is states we are at [facility] [place] [facility] next to the places.  Skin: Skin is warm and dry.  Psychiatric: She has a normal mood and affect. Her behavior is normal.  Nursing note and vitals reviewed.   ED Course  Procedures (including critical care time) Labs Review Labs Reviewed  BASIC METABOLIC PANEL - Abnormal; Notable for the following:    Potassium 3.2 (*)    Glucose, Bld 111 (*)    GFR calc non Af Amer 57 (*)    All other components within normal limits  CBC WITH DIFFERENTIAL/PLATELET - Abnormal; Notable for the following:    RBC 3.31 (*)    Hemoglobin 9.9 (*)    HCT 30.7 (*)    Platelets 474 (*)    Neutro Abs 7.9 (*)    All other  components within normal limits    Imaging Review No results found. I have personally reviewed and evaluated these images and lab results as part of my medical decision-making.   EKG Interpretation None      MDM   Final diagnoses:  Post-operative pain    Patient here for knee evaluation following knee replacement on October 26. Examination is consistent with postoperative hematoma that is draining with wound dehiscence. Patient is unable to ambulate. Discussed with social work and will work on getting placement in rehabilitation facility. Discussed with Dr. Darrelyn Hillock regarding postoperative hematoma.    Tilden Fossa, MD 03/01/15 1540

## 2015-03-01 NOTE — H&P (Signed)
TOTAL KNEE ADMISSION H&P  Patient is being admitted for Wound Care and admission to SNF  Subjective:  Chief Complaint:Bleeding from Right Total Knee secondary to a questionable fall.  HPI:She was recently released from the SNF and came into ER today because of a small wound Separation secondary to a hematoma. She is unable to ambulate despite help at home.  Patient Active Problem List   Diagnosis Date Noted  . History of total knee arthroplasty 02/11/2015  . 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 Monilial vulvovaginitis 09/24/2014  . Cough 09/24/2014  . Tracheobronchitis 08/06/2014  . Deaf 07/02/2014  . Right knee pain 07/02/2014  . History of shingles 2015 02/14/2014  . Depression, major, recurrent, moderate (HCC) 01/07/2014  . Grief 11/27/2013  . Left arm pain 11/06/2013  . Corn of foot 10/23/2013  . Leg edema 09/03/2013  . Insomnia 07/02/2013  . Diastolic CHF (HCC) 03/02/2013  . Pulmonary hypertension (HCC) 03/02/2013  . History of CVA (cerebrovascular accident) 03/01/2013  . HTN (hypertension) 03/01/2013  . HLD (hyperlipidemia) 03/01/2013  . Tachycardia 12/10/2012  . Anxiety 12/10/2012  . Myalgia and myositis, unspecified 09/18/2012  . Other malaise and fatigue 09/18/2012  . Backache 03/31/2008   Past Medical History  Diagnosis Date  . Hypertension   . Abnormality of gait   . Disturbance of skin sensation   . Tachycardia, unspecified   . Internal hemorrhoids without mention of complication   . Synovial cyst of popliteal space   . Spinal stenosis, other region   . Sciatica   . Lumbago   . Unspecified vitamin D deficiency   . Anxiety   . Hyperlipidemia   . Abnormality of gait   . Unspecified sinusitis (chronic)   . Unspecified pruritic disorder   . Pain in joint, lower leg   . Depressive disorder, not elsewhere classified   . Unspecified glaucoma   . Reflux esophagitis   . Diverticulosis of colon (without mention of hemorrhage)   . Pain in joint, site unspecified    . Myalgia and myositis, unspecified   . Osteoporosis, unspecified   . Other malaise and fatigue   . Cervicalgia   . Benign neoplasm of colon   . H/O echocardiogram 09/01/11    EF >55%, mild MR, RV systolic pressure elevated at 09-81 mmHg, mod TR  . Normal cardiac stress test 11/16/09    low risk scan, EF  77%  . Shortness of breath   . Stroke [facility]) "a couple of years ago"     visual problems left eye  . Arthritis   . Swelling of both ankles     rt leg  . Bruises easily     Past Surgical History  Procedure Laterality Date  . Lumbar laminectomy  12/04/2012    Complete decompressive L3-4 and L4-5--Dr. Darrelyn Hillock  . Abdominal hysterectomy    . Cholecystectomy    . Cardiovascular stress test  11/16/2009    Perfusion defect seen in inferior myocardial region consistent with diaphragmatic attenuation. Remaining myocardium demonstrates normalmyocardial perfusion with no evidence of ischemia or infarct. No ECG changes. EKG negative for ischemia.  . Transthoracic echocardiogram  09/01/2011    EF >55% and moderate tricuspid valve regurg.  . Bilateral oophorectomy    . Total knee arthroplasty Right 02/11/2015    Procedure: TOTAL KNEE ARTHROPLASTY;  Surgeon: Ranee Gosselin, MD;  Location: [facility];  Service: Orthopedics;  Laterality: Right;     (Not in a hospital admission) No Known Allergies  Social History  Substance Use  Topics  . Smoking status: Never Smoker   . Smokeless tobacco: Not on file  . Alcohol Use: No    Family History  Problem Relation Age of Onset  . Cancer Sister   . Stroke Mother   . Stroke Father      Review of Systems  Constitutional: Negative.   HENT: Negative.   Eyes: Negative.   Respiratory: Negative.   Cardiovascular: Negative.   Gastrointestinal: Negative.   Genitourinary: Negative.   Musculoskeletal: Positive for joint pain.  Skin:       Small wound deheisence secondary to Hematoma.  Neurological:       Dementia  Endo/Heme/Allergies: Bruises/bleeds  easily.  Psychiatric/Behavioral: Positive for memory loss.    Objective:  Physical Exam  Constitutional: She appears distressed.  HENT:  Head: Normocephalic.  Eyes: Pupils are equal, round, and reactive to light.  Neck: Normal range of motion.  Cardiovascular: Normal rate.   Heart Sounds are distant.  Respiratory: Effort normal.  GI: Soft.  Musculoskeletal:  Slight wound Dehisence Right Knee.  Neurological:  Dementia.  Skin:  Wound seperation secondary to sub Q hematoma.    Vital signs in last 24 hours: Temp:  [97.6 F (36.4 C)] 97.6 F (36.4 C) (11/13 1149) Pulse Rate:  [55-58] 55 (11/13 1350) Resp:  [15-16] 16 (11/13 1350) BP: (147-172)/(52-92) 147/52 mmHg (11/13 1350) SpO2:  [92 %-100 %] 92 % (11/13 1350) Weight:  [72.576 kg (160 lb)] 72.576 kg (160 lb) (11/13 1149)  Labs:   Estimated body mass index is 31.25 kg/(m^2) as calculated from the following:   Height as of this encounter: 5' (1.524 m).   Weight as of this encounter: 72.576 kg (160 lb).   Imaging Review Post-Op Xrays are fine  Assessment/Plan:  Hematoma of Right Knee with wound separation.Will admit for wound care and SNF.

## 2015-03-01 NOTE — ED Notes (Signed)
Pt had knee surgery here at end of October. Noticed joint swelling and dehiscence 3 days ago and worsening since.

## 2015-03-01 NOTE — Clinical Social Work Note (Signed)
Clinical Social Work Assessment  Patient Details  Name: Kristin Wilkerson Date of Birth: 22-Aug-1925  Date of referral:  03/01/15               Reason for consult:  Facility Placement                Permission sought to share information with:  Case Manager, Magazine features editoracility Contact Representative, Family Supports Permission granted to share information::  Yes, Verbal Permission Granted  Name::        Agency::  [facility]  Relationship::  son and dtr  SolicitorContact Information:  son-Brian [place], [phone]. dtr-[name] [place], [phone]  Housing/Transportation Living arrangements for the past 2 months:  Skilled Nursing Facility, Single Family Home [facility]. prior to that living at home with her adult son, Arlys JohnBrian) Source of Information:  Patient, Adult Children Patient Interpreter Needed:  None Criminal Activity/Legal Involvement Pertinent to Current Situation/Hospitalization:  No - Comment as needed Significant Relationships:  Adult Children Lives with:  Adult Children (lives with son Arlys JohnBrian [phone])) Do you feel safe going back to the place where you live?  No Need for family participation in patient care:  Yes (Comment)  Care giving concerns:  Pt unable to walk.   Social Worker assessment / plan:  [facility], back to home where she lives with her son Arlys JohnBrian. She had great difficulty ambulating and family had difficulty helping her move. Today, she was completely unable to walk, so family brought her to ED [facility] so she has days left. [facility] spoke [facility] RN Clydie BraunKaren. [facility] does not take admissions on the weekends--please call Donne HazelSharon Lowa in admissions after 8am tomorrow to facilitate transfer. Pt admitted to OBS for the evening.  Employment status:  Retired Health and safety inspectornsurance information:  [employer], Other (Comment Required) (mutual of [place] mcare  supplement) PT Recommendations:    Information / Referral to community resources:  Skilled Nursing Facility  Patient/Family's Response to care:  Pt and family both express that they want pt to [facility].  Patient/Family's Understanding of and Emotional Response to Diagnosis, Current Treatment, and Prognosis:  [facility]. Son calm and accepting of [facility] and eventual return to his home and care. Dtr anxious and expressed some complaints about skilled nursing care generally; however, expressed relief at possibility pt could [facility].  Emotional Assessment Appearance:  Appears stated age, Disheveled Attitude/Demeanor/Rapport:  Other (pleasant, appropriate) Affect (typically observed):  Accepting Orientation:  Oriented to Self, Oriented to Place Alcohol / Substance use:    Psych involvement (Current and /or in the community):  No (Comment)  Discharge Needs  Concerns to be addressed:  No discharge needs identified Readmission within the last 30 days:  No Current discharge risk:  None Barriers to Discharge:  No Barriers Identified   Korrey Schleicher LEWIS, LCSW 03/01/2015, 3:46 PM

## 2015-03-02 ENCOUNTER — Encounter (HOSPITAL_COMMUNITY): Payer: Self-pay | Admitting: *Deleted

## 2015-03-02 DIAGNOSIS — Z6829 Body mass index (BMI) 29.0-29.9, adult: Secondary | ICD-10-CM | POA: Diagnosis not present

## 2015-03-02 DIAGNOSIS — M25561 Pain in right knee: Secondary | ICD-10-CM | POA: Diagnosis not present

## 2015-03-02 DIAGNOSIS — Z96651 Presence of right artificial knee joint: Secondary | ICD-10-CM | POA: Diagnosis present

## 2015-03-02 DIAGNOSIS — M1711 Unilateral primary osteoarthritis, right knee: Secondary | ICD-10-CM | POA: Diagnosis not present

## 2015-03-02 DIAGNOSIS — M810 Age-related osteoporosis without current pathological fracture: Secondary | ICD-10-CM | POA: Diagnosis present

## 2015-03-02 DIAGNOSIS — Y9389 Activity, other specified: Secondary | ICD-10-CM | POA: Diagnosis not present

## 2015-03-02 DIAGNOSIS — I5032 Chronic diastolic (congestive) heart failure: Secondary | ICD-10-CM | POA: Diagnosis not present

## 2015-03-02 DIAGNOSIS — F419 Anxiety disorder, unspecified: Secondary | ICD-10-CM | POA: Diagnosis not present

## 2015-03-02 DIAGNOSIS — M66251 Spontaneous rupture of extensor tendons, right thigh: Secondary | ICD-10-CM | POA: Diagnosis not present

## 2015-03-02 DIAGNOSIS — I6529 Occlusion and stenosis of unspecified carotid artery: Secondary | ICD-10-CM | POA: Diagnosis not present

## 2015-03-02 DIAGNOSIS — K5900 Constipation, unspecified: Secondary | ICD-10-CM | POA: Diagnosis not present

## 2015-03-02 DIAGNOSIS — I10 Essential (primary) hypertension: Secondary | ICD-10-CM | POA: Diagnosis not present

## 2015-03-02 DIAGNOSIS — F329 Major depressive disorder, single episode, unspecified: Secondary | ICD-10-CM | POA: Diagnosis not present

## 2015-03-02 DIAGNOSIS — Y998 Other external cause status: Secondary | ICD-10-CM | POA: Diagnosis not present

## 2015-03-02 DIAGNOSIS — Y92129 Unspecified place in nursing home as the place of occurrence of the external cause: Secondary | ICD-10-CM | POA: Diagnosis not present

## 2015-03-02 DIAGNOSIS — M7989 Other specified soft tissue disorders: Secondary | ICD-10-CM | POA: Diagnosis not present

## 2015-03-02 DIAGNOSIS — E669 Obesity, unspecified: Secondary | ICD-10-CM | POA: Diagnosis present

## 2015-03-02 DIAGNOSIS — R609 Edema, unspecified: Secondary | ICD-10-CM | POA: Diagnosis not present

## 2015-03-02 DIAGNOSIS — Z79899 Other long term (current) drug therapy: Secondary | ICD-10-CM | POA: Diagnosis not present

## 2015-03-02 DIAGNOSIS — M542 Cervicalgia: Secondary | ICD-10-CM | POA: Diagnosis not present

## 2015-03-02 DIAGNOSIS — L8990 Pressure ulcer of unspecified site, unspecified stage: Secondary | ICD-10-CM | POA: Diagnosis not present

## 2015-03-02 DIAGNOSIS — Z7982 Long term (current) use of aspirin: Secondary | ICD-10-CM | POA: Diagnosis not present

## 2015-03-02 DIAGNOSIS — S0990XA Unspecified injury of head, initial encounter: Secondary | ICD-10-CM | POA: Diagnosis not present

## 2015-03-02 DIAGNOSIS — F0390 Unspecified dementia without behavioral disturbance: Secondary | ICD-10-CM | POA: Diagnosis present

## 2015-03-02 DIAGNOSIS — N390 Urinary tract infection, site not specified: Secondary | ICD-10-CM | POA: Diagnosis not present

## 2015-03-02 DIAGNOSIS — T8131XS Disruption of external operation (surgical) wound, not elsewhere classified, sequela: Secondary | ICD-10-CM | POA: Diagnosis not present

## 2015-03-02 DIAGNOSIS — K210 Gastro-esophageal reflux disease with esophagitis: Secondary | ICD-10-CM | POA: Diagnosis not present

## 2015-03-02 DIAGNOSIS — T148 Other injury of unspecified body region: Secondary | ICD-10-CM | POA: Diagnosis not present

## 2015-03-02 DIAGNOSIS — R531 Weakness: Secondary | ICD-10-CM | POA: Diagnosis not present

## 2015-03-02 DIAGNOSIS — M6281 Muscle weakness (generalized): Secondary | ICD-10-CM | POA: Diagnosis not present

## 2015-03-02 DIAGNOSIS — F05 Delirium due to known physiological condition: Secondary | ICD-10-CM | POA: Diagnosis not present

## 2015-03-02 DIAGNOSIS — J309 Allergic rhinitis, unspecified: Secondary | ICD-10-CM | POA: Diagnosis not present

## 2015-03-02 DIAGNOSIS — Z01818 Encounter for other preprocedural examination: Secondary | ICD-10-CM | POA: Diagnosis not present

## 2015-03-02 DIAGNOSIS — T8131XA Disruption of external operation (surgical) wound, not elsewhere classified, initial encounter: Secondary | ICD-10-CM | POA: Diagnosis present

## 2015-03-02 DIAGNOSIS — Y658 Other specified misadventures during surgical and medical care: Secondary | ICD-10-CM | POA: Diagnosis not present

## 2015-03-02 DIAGNOSIS — R3915 Urgency of urination: Secondary | ICD-10-CM | POA: Diagnosis not present

## 2015-03-02 DIAGNOSIS — D72829 Elevated white blood cell count, unspecified: Secondary | ICD-10-CM | POA: Diagnosis not present

## 2015-03-02 DIAGNOSIS — R600 Localized edema: Secondary | ICD-10-CM | POA: Diagnosis not present

## 2015-03-02 DIAGNOSIS — W1839XA Other fall on same level, initial encounter: Secondary | ICD-10-CM | POA: Diagnosis not present

## 2015-03-02 DIAGNOSIS — T8453XA Infection and inflammatory reaction due to internal right knee prosthesis, initial encounter: Secondary | ICD-10-CM | POA: Diagnosis not present

## 2015-03-02 DIAGNOSIS — Z86018 Personal history of other benign neoplasm: Secondary | ICD-10-CM | POA: Diagnosis not present

## 2015-03-02 DIAGNOSIS — T8131XD Disruption of external operation (surgical) wound, not elsewhere classified, subsequent encounter: Secondary | ICD-10-CM | POA: Diagnosis not present

## 2015-03-02 DIAGNOSIS — T8189XA Other complications of procedures, not elsewhere classified, initial encounter: Secondary | ICD-10-CM | POA: Diagnosis not present

## 2015-03-02 DIAGNOSIS — I071 Rheumatic tricuspid insufficiency: Secondary | ICD-10-CM | POA: Diagnosis present

## 2015-03-02 DIAGNOSIS — E785 Hyperlipidemia, unspecified: Secondary | ICD-10-CM | POA: Diagnosis not present

## 2015-03-02 DIAGNOSIS — S79911A Unspecified injury of right hip, initial encounter: Secondary | ICD-10-CM | POA: Diagnosis not present

## 2015-03-02 DIAGNOSIS — Z792 Long term (current) use of antibiotics: Secondary | ICD-10-CM | POA: Diagnosis not present

## 2015-03-02 DIAGNOSIS — S8991XA Unspecified injury of right lower leg, initial encounter: Secondary | ICD-10-CM | POA: Diagnosis not present

## 2015-03-02 DIAGNOSIS — R4189 Other symptoms and signs involving cognitive functions and awareness: Secondary | ICD-10-CM | POA: Diagnosis not present

## 2015-03-02 DIAGNOSIS — F331 Major depressive disorder, recurrent, moderate: Secondary | ICD-10-CM | POA: Diagnosis not present

## 2015-03-02 DIAGNOSIS — S83194A Other dislocation of right knee, initial encounter: Secondary | ICD-10-CM | POA: Diagnosis present

## 2015-03-02 DIAGNOSIS — B373 Candidiasis of vulva and vagina: Secondary | ICD-10-CM | POA: Diagnosis not present

## 2015-03-02 DIAGNOSIS — Z471 Aftercare following joint replacement surgery: Secondary | ICD-10-CM | POA: Diagnosis not present

## 2015-03-02 DIAGNOSIS — M25551 Pain in right hip: Secondary | ICD-10-CM | POA: Diagnosis not present

## 2015-03-02 DIAGNOSIS — Z8709 Personal history of other diseases of the respiratory system: Secondary | ICD-10-CM | POA: Diagnosis not present

## 2015-03-02 DIAGNOSIS — S83104A Unspecified dislocation of right knee, initial encounter: Secondary | ICD-10-CM | POA: Diagnosis not present

## 2015-03-02 DIAGNOSIS — Y838 Other surgical procedures as the cause of abnormal reaction of the patient, or of later complication, without mention of misadventure at the time of the procedure: Secondary | ICD-10-CM | POA: Diagnosis present

## 2015-03-02 DIAGNOSIS — Z872 Personal history of diseases of the skin and subcutaneous tissue: Secondary | ICD-10-CM | POA: Diagnosis not present

## 2015-03-02 DIAGNOSIS — M609 Myositis, unspecified: Secondary | ICD-10-CM | POA: Diagnosis present

## 2015-03-02 DIAGNOSIS — Z9889 Other specified postprocedural states: Secondary | ICD-10-CM | POA: Diagnosis not present

## 2015-03-02 DIAGNOSIS — D62 Acute posthemorrhagic anemia: Secondary | ICD-10-CM | POA: Diagnosis not present

## 2015-03-02 DIAGNOSIS — W19XXXA Unspecified fall, initial encounter: Secondary | ICD-10-CM | POA: Diagnosis present

## 2015-03-02 DIAGNOSIS — S8001XA Contusion of right knee, initial encounter: Secondary | ICD-10-CM | POA: Diagnosis not present

## 2015-03-02 DIAGNOSIS — Z809 Family history of malignant neoplasm, unspecified: Secondary | ICD-10-CM | POA: Diagnosis not present

## 2015-03-02 DIAGNOSIS — E43 Unspecified severe protein-calorie malnutrition: Secondary | ICD-10-CM | POA: Diagnosis not present

## 2015-03-02 DIAGNOSIS — K5901 Slow transit constipation: Secondary | ICD-10-CM | POA: Diagnosis not present

## 2015-03-02 DIAGNOSIS — R2681 Unsteadiness on feet: Secondary | ICD-10-CM | POA: Diagnosis not present

## 2015-03-02 DIAGNOSIS — M1990 Unspecified osteoarthritis, unspecified site: Secondary | ICD-10-CM | POA: Diagnosis present

## 2015-03-02 DIAGNOSIS — H409 Unspecified glaucoma: Secondary | ICD-10-CM | POA: Diagnosis present

## 2015-03-02 DIAGNOSIS — Z96659 Presence of unspecified artificial knee joint: Secondary | ICD-10-CM | POA: Diagnosis not present

## 2015-03-02 DIAGNOSIS — E46 Unspecified protein-calorie malnutrition: Secondary | ICD-10-CM | POA: Diagnosis not present

## 2015-03-02 DIAGNOSIS — Z9181 History of falling: Secondary | ICD-10-CM | POA: Diagnosis not present

## 2015-03-02 DIAGNOSIS — T8130XA Disruption of wound, unspecified, initial encounter: Secondary | ICD-10-CM | POA: Diagnosis not present

## 2015-03-02 DIAGNOSIS — S71101A Unspecified open wound, right thigh, initial encounter: Secondary | ICD-10-CM | POA: Diagnosis not present

## 2015-03-02 DIAGNOSIS — R269 Unspecified abnormalities of gait and mobility: Secondary | ICD-10-CM | POA: Diagnosis not present

## 2015-03-02 DIAGNOSIS — Z823 Family history of stroke: Secondary | ICD-10-CM | POA: Diagnosis not present

## 2015-03-02 DIAGNOSIS — E559 Vitamin D deficiency, unspecified: Secondary | ICD-10-CM | POA: Diagnosis present

## 2015-03-02 DIAGNOSIS — R296 Repeated falls: Secondary | ICD-10-CM | POA: Diagnosis present

## 2015-03-02 DIAGNOSIS — Z8673 Personal history of transient ischemic attack (TIA), and cerebral infarction without residual deficits: Secondary | ICD-10-CM | POA: Diagnosis not present

## 2015-03-02 DIAGNOSIS — Z8719 Personal history of other diseases of the digestive system: Secondary | ICD-10-CM | POA: Diagnosis not present

## 2015-03-02 DIAGNOSIS — R29810 Facial weakness: Secondary | ICD-10-CM | POA: Diagnosis not present

## 2015-03-02 LAB — CBC WITH DIFFERENTIAL/PLATELET
Basophils Absolute: 0 10*3/uL (ref 0.0–0.1)
Basophils Relative: 1 %
Eosinophils Absolute: 0.2 10*3/uL (ref 0.0–0.7)
Eosinophils Relative: 2 %
HCT: 29.1 % — ABNORMAL LOW (ref 36.0–46.0)
Hemoglobin: 9.2 g/dL — ABNORMAL LOW (ref 12.0–15.0)
Lymphocytes Relative: 26 %
Lymphs Abs: 1.8 10*3/uL (ref 0.7–4.0)
MCH: 28.8 pg (ref 26.0–34.0)
MCHC: 31.6 g/dL (ref 30.0–36.0)
MCV: 91.2 fL (ref 78.0–100.0)
Monocytes Absolute: 0.7 10*3/uL (ref 0.1–1.0)
Monocytes Relative: 10 %
Neutro Abs: 4.3 10*3/uL (ref 1.7–7.7)
Neutrophils Relative %: 61 %
Platelets: 414 10*3/uL — ABNORMAL HIGH (ref 150–400)
RBC: 3.19 MIL/uL — ABNORMAL LOW (ref 3.87–5.11)
RDW: 14.5 % (ref 11.5–15.5)
WBC: 7 10*3/uL (ref 4.0–10.5)

## 2015-03-02 LAB — COMPREHENSIVE METABOLIC PANEL
ALT: 12 U/L — ABNORMAL LOW (ref 14–54)
AST: 19 U/L (ref 15–41)
Albumin: 2.7 g/dL — ABNORMAL LOW (ref 3.5–5.0)
Alkaline Phosphatase: 83 U/L (ref 38–126)
Anion gap: 8 (ref 5–15)
BUN: 19 mg/dL (ref 6–20)
CO2: 25 mmol/L (ref 22–32)
Calcium: 8.6 mg/dL — ABNORMAL LOW (ref 8.9–10.3)
Chloride: 106 mmol/L (ref 101–111)
Creatinine, Ser: 0.77 mg/dL (ref 0.44–1.00)
GFR calc Af Amer: >60 mL/min (ref >60)
GFR calc non Af Amer: >60 mL/min (ref >60)
Glucose, Bld: 102 mg/dL — ABNORMAL HIGH (ref 65–99)
Potassium: 3.1 mmol/L — ABNORMAL LOW (ref 3.5–5.1)
Sodium: 139 mmol/L (ref 135–145)
Total Bilirubin: 1 mg/dL (ref 0.3–1.2)
Total Protein: 5.7 g/dL — ABNORMAL LOW (ref 6.5–8.1)

## 2015-03-02 LAB — PROTIME-INR
INR: 1.15 (ref 0.00–1.49)
Prothrombin Time: 14.9 seconds (ref 11.6–15.2)

## 2015-03-02 MED ORDER — DOXYCYCLINE HYCLATE 100 MG PO TABS: 100.0000 mg | ORAL_TABLET | Freq: Two times a day (BID) | ORAL | Status: DC

## 2015-03-02 MED ORDER — ASPIRIN 325 MG PO TBEC: 325.0000 mg | DELAYED_RELEASE_TABLET | Freq: Two times a day (BID) | ORAL | Status: DC

## 2015-03-02 MED ORDER — ASPIRIN EC 325 MG PO TBEC
325.0000 mg | DELAYED_RELEASE_TABLET | Freq: Two times a day (BID) | ORAL | Status: DC
  Administered 2015-03-02: 325 mg via ORAL
  Filled 2015-03-02 (×2): qty 1

## 2015-03-02 NOTE — Evaluation (Signed)
Physical Therapy Evaluation Patient Details Name: Kristin Wilkerson MRN: [id number] DOB: [birth year] Today's Date: 03/02/2015   History of Present Illness  Pt s/p R TKR 3 wks ago; [facility] and now re-admitted with right knee dehiscence  Clinical Impression  Pt admitted with above diagnosis. Pt currently with functional limitations due to the deficits listed below (see PT Problem List).  Pt will benefit from skilled PT to increase their independence and safety with mobility to allow discharge to the venue listed below.  Pt is mildly confused, with significant balance deficits, needs SNF; recommend 24hour assist if pt should D/C home; pt is at incr risk for falls, see below for further details      Follow Up Recommendations SNF    Equipment Recommendations  None recommended by PT    Recommendations for Other Services       Precautions / Restrictions Precautions Precautions: Knee;Fall Required Braces or Orthoses: Knee Immobilizer - Right Knee Immobilizer - Right: Discontinue once straight leg raise with < 10 degree lag;On at all times Restrictions Weight Bearing Restrictions: No Other Position/Activity Restrictions: WBAT      Mobility  Bed Mobility Overal bed mobility: Needs Assistance Bed Mobility: Supine to Sit     Supine to sit: Min assist     General bed mobility comments: incr time, multi-modal cues for technique, use of UEs  Transfers Overall transfer level: Needs assistance Equipment used: Rolling walker (2 wheeled) Transfers: Sit to/from Stand Sit to Stand: Min assist;Min guard         General transfer comment: assist to rise and stabilize in standing, multi-modal cues for hand placement and safety  Ambulation/Gait Ambulation/Gait assistance: Min assist;Mod assist Ambulation Distance (Feet): 20 Feet Assistive device: Rolling walker (2 wheeled)   Gait velocity: decr   General Gait Details: cues for sequence, posture, step length, and  position from [employer]  Stairs            Wheelchair Mobility    Modified Rankin (Stroke Patients Only)       Balance Overall balance assessment: Needs assistance Sitting-balance support: No upper extremity supported;Feet supported Sitting balance-Leahy Scale: Good       Standing balance-Leahy Scale: Poor Standing balance comment: pt able to maintain static stand very briefly <3sec, with min/guard to close supervision                             Pertinent Vitals/Pain Pain Assessment: Faces Faces Pain Scale: Hurts a little bit Pain Location: r knee Pain Descriptors / Indicators: Sore Pain Intervention(s): Limited activity within patient's tolerance;Monitored during session;Repositioned    Home Living Family/patient expects to be discharged to:: Skilled nursing facility                      Prior Function Level of Independence: Independent;Independent with assistive device(s)         Comments: used RW occaisonally prior to TKA     Hand Dominance        Extremity/Trunk Assessment               Lower Extremity Assessment: RLE deficits/detail RLE Deficits / Details: ankle WFL but AROM painful    Cervical / Trunk Assessment: Kyphotic  Communication   Communication: HOH  Cognition Arousal/Alertness: Awake/alert   Overall Cognitive Status: Impaired/Different from baseline Area of Impairment: Orientation;Memory;Safety/judgement;Problem solving Orientation Level: Disoriented to;Place;Situation;Time   Memory: Decreased short-term memory  Safety/Judgement: Decreased awareness of safety   Problem Solving: Difficulty sequencing;Requires verbal cues;Requires tactile cues      General Comments      Exercises Total Joint Exercises Ankle Circles/Pumps: AROM;Both;15 reps;Supine      Assessment/Plan    PT Assessment Patient needs continued PT services  PT Diagnosis Difficulty walking;Generalized weakness   PT Problem List  Decreased strength;Decreased range of motion;Decreased activity tolerance;Decreased mobility;Decreased knowledge of use of DME;Obesity;Pain  PT Treatment Interventions DME instruction;Gait training;Stair training;Functional mobility training;Therapeutic activities;Therapeutic exercise;Patient/family education   PT Goals (Current goals can be found in the Care Plan section) Acute Rehab PT Goals Patient Stated Goal: Rehab at [facility], be able to walk PT Goal Formulation: With patient/family Time For Goal Achievement: 03/09/15 Potential to Achieve Goals: Good    Frequency 7X/week   Barriers to discharge        Co-evaluation               End of Session Equipment Utilized During Treatment: Gait belt;Right knee immobilizer Activity Tolerance: Patient tolerated treatment well Patient left: in chair;with call bell/phone within reach;with chair alarm set;with family/visitor present Nurse Communication: Mobility status    Functional Assessment Tool Used: clinical judgement Functional Limitation: Mobility: Walking and moving around Mobility: Walking and Moving Around Current Status (N6295): At least 1 percent but less than 20 percent impaired, limited or restricted Mobility: Walking and Moving Around Goal Status [phone]): At least 1 percent but less than 20 percent impaired, limited or restricted    Time: 1100-1126 PT Time Calculation (min) (ACUTE ONLY): 26 min   Charges:   PT Evaluation $Initial PT Evaluation Tier I: 1 Procedure PT Treatments $Gait Training: 8-22 mins   PT G Codes:   PT G-Codes **NOT FOR INPATIENT CLASS** Functional Assessment Tool Used: clinical judgement Functional Limitation: Mobility: Walking and moving around Mobility: Walking and Moving Around Current Status (K4401): At least 1 percent but less than 20 percent impaired, limited or restricted Mobility: Walking and Moving Around Goal Status [phone]): At least 1 percent but less than 20 percent impaired, limited  or restricted    [facility] 03/02/2015, 11:49 AM

## 2015-03-02 NOTE — Discharge Summary (Signed)
Physician Discharge Summary   Patient ID: Kristin Wilkerson MRN: [id number] DOB/AGE: Mar 27, 1926 81 y.o.  Admit date: 03/01/2015 Discharge date: 03/02/2015  Primary Diagnosis: S/p right total knee arthroplasty   Postoperative wound dehiscence    Admission Diagnoses:  Past Medical History  Diagnosis Date  . Hypertension   . Abnormality of gait   . Disturbance of skin sensation   . Tachycardia, unspecified   . Internal hemorrhoids without mention of complication   . Synovial cyst of popliteal space   . Spinal stenosis, other region   . Sciatica   . Lumbago   . Unspecified vitamin D deficiency   . Anxiety   . Hyperlipidemia   . Abnormality of gait   . Unspecified sinusitis (chronic)   . Unspecified pruritic disorder   . Pain in joint, lower leg   . Depressive disorder, not elsewhere classified   . Unspecified glaucoma   . Reflux esophagitis   . Diverticulosis of colon (without mention of hemorrhage)   . Pain in joint, site unspecified   . Myalgia and myositis, unspecified   . Osteoporosis, unspecified   . Other malaise and fatigue   . Cervicalgia   . Benign neoplasm of colon   . H/O echocardiogram 09/01/11    EF >55%, mild MR, RV systolic pressure elevated at 30-40 mmHg, mod TR  . Normal cardiac stress test 11/16/09    low risk scan, EF  77%  . Shortness of breath   . Stroke [facility]) "a couple of years ago"     visual problems left eye  . Arthritis   . Swelling of both ankles     rt leg  . Bruises easily    Discharge Diagnoses:   Active Problems:   Postoperative wound dehiscence  Estimated body mass index is 31.25 kg/(m^2) as calculated from the following:   Height as of this encounter: 5' (1.524 m).   Weight as of this encounter: 72.576 kg (160 lb).   Consults: None  HPI: Patient presented to the ED 3 weeks PO from right total knee arthroplasty with wound dehiscence following questionable fall at home.   Laboratory Data: Admission on 03/01/2015  Component Date  Value Ref Range Status  . Sodium 03/01/2015 141  135 - 145 mmol/L Final  . Potassium 03/01/2015 3.2* 3.5 - 5.1 mmol/L Final  . Chloride 03/01/2015 107  101 - 111 mmol/L Final  . CO2 03/01/2015 23  22 - 32 mmol/L Final  . Glucose, Bld 03/01/2015 111* 65 - 99 mg/dL Final  . BUN 03/01/2015 18  6 - 20 mg/dL Final  . Creatinine, Ser 03/01/2015 0.89  0.44 - 1.00 mg/dL Final  . Calcium 03/01/2015 9.1  8.9 - 10.3 mg/dL Final  . GFR calc non Af Amer 03/01/2015 57* >60 mL/min Final  . GFR calc Af Amer 03/01/2015 >60  >60 mL/min Final   Comment: (NOTE) The eGFR has been calculated using the CKD EPI equation. This calculation has not been validated in all clinical situations. eGFR's persistently <60 mL/min signify possible Chronic Kidney Disease.   . Anion gap 03/01/2015 11  5 - 15 Final  . WBC 03/01/2015 10.1  4.0 - 10.5 K/uL Final  . RBC 03/01/2015 3.31* 3.87 - 5.11 MIL/uL Final  . Hemoglobin 03/01/2015 9.9* 12.0 - 15.0 g/dL Final  . HCT 03/01/2015 30.7* 36.0 - 46.0 % Final  . MCV 03/01/2015 92.7  78.0 - 100.0 fL Final  . MCH 03/01/2015 29.9  26.0 - 34.0 pg Final  .  MCHC 03/01/2015 32.2  30.0 - 36.0 g/dL Final  . RDW 03/01/2015 14.7  11.5 - 15.5 % Final  . Platelets 03/01/2015 474* 150 - 400 K/uL Final  . Neutrophils Relative % 03/01/2015 78   Final  . Neutro Abs 03/01/2015 7.9* 1.7 - 7.7 K/uL Final  . Lymphocytes Relative 03/01/2015 13   Final  . Lymphs Abs 03/01/2015 1.3  0.7 - 4.0 K/uL Final  . Monocytes Relative 03/01/2015 8   Final  . Monocytes Absolute 03/01/2015 0.8  0.1 - 1.0 K/uL Final  . Eosinophils Relative 03/01/2015 1   Final  . Eosinophils Absolute 03/01/2015 0.1  0.0 - 0.7 K/uL Final  . Basophils Relative 03/01/2015 0   Final  . Basophils Absolute 03/01/2015 0.0  0.0 - 0.1 K/uL Final  . Sodium 03/02/2015 139  135 - 145 mmol/L Final  . Potassium 03/02/2015 3.1* 3.5 - 5.1 mmol/L Final  . Chloride 03/02/2015 106  101 - 111 mmol/L Final  . CO2 03/02/2015 25  22 - 32 mmol/L  Final  . Glucose, Bld 03/02/2015 102* 65 - 99 mg/dL Final  . BUN 03/02/2015 19  6 - 20 mg/dL Final  . Creatinine, Ser 03/02/2015 0.77  0.44 - 1.00 mg/dL Final  . Calcium 03/02/2015 8.6* 8.9 - 10.3 mg/dL Final  . Total Protein 03/02/2015 5.7* 6.5 - 8.1 g/dL Final  . Albumin 03/02/2015 2.7* 3.5 - 5.0 g/dL Final  . AST 03/02/2015 19  15 - 41 U/L Final  . ALT 03/02/2015 12* 14 - 54 U/L Final  . Alkaline Phosphatase 03/02/2015 83  38 - 126 U/L Final  . Total Bilirubin 03/02/2015 1.0  0.3 - 1.2 mg/dL Final  . GFR calc non Af Amer 03/02/2015 >60  >60 mL/min Final  . GFR calc Af Amer 03/02/2015 >60  >60 mL/min Final   Comment: (NOTE) The eGFR has been calculated using the CKD EPI equation. This calculation has not been validated in all clinical situations. eGFR's persistently <60 mL/min signify possible Chronic Kidney Disease.   . Anion gap 03/02/2015 8  5 - 15 Final  . Prothrombin Time 03/02/2015 14.9  11.6 - 15.2 seconds Final  . INR 03/02/2015 1.15  0.00 - 1.49 Final  . WBC 03/02/2015 7.0  4.0 - 10.5 K/uL Final  . RBC 03/02/2015 3.19* 3.87 - 5.11 MIL/uL Final  . Hemoglobin 03/02/2015 9.2* 12.0 - 15.0 g/dL Final  . HCT 03/02/2015 29.1* 36.0 - 46.0 % Final  . MCV 03/02/2015 91.2  78.0 - 100.0 fL Final  . MCH 03/02/2015 28.8  26.0 - 34.0 pg Final  . MCHC 03/02/2015 31.6  30.0 - 36.0 g/dL Final  . RDW 03/02/2015 14.5  11.5 - 15.5 % Final  . Platelets 03/02/2015 414* 150 - 400 K/uL Final  . Neutrophils Relative % 03/02/2015 61   Final  . Neutro Abs 03/02/2015 4.3  1.7 - 7.7 K/uL Final  . Lymphocytes Relative 03/02/2015 26   Final  . Lymphs Abs 03/02/2015 1.8  0.7 - 4.0 K/uL Final  . Monocytes Relative 03/02/2015 10   Final  . Monocytes Absolute 03/02/2015 0.7  0.1 - 1.0 K/uL Final  . Eosinophils Relative 03/02/2015 2   Final  . Eosinophils Absolute 03/02/2015 0.2  0.0 - 0.7 K/uL Final  . Basophils Relative 03/02/2015 1   Final  . Basophils Absolute 03/02/2015 0.0  0.0 - 0.1 K/uL Final    Nursing Home on 02/20/2015  Component Date Value Ref Range Status  . Hemoglobin 02/17/2015  10.6* 12.0 - 16.0 g/dL Final  . HCT 02/17/2015 33* 36 - 46 % Final  . Platelets 02/17/2015 295  150 - 399 K/L Final  . WBC 02/17/2015 9.1   Final  . Glucose 02/17/2015 84   Final  . BUN 02/17/2015 32* 4 - 21 mg/dL Final  . Creatinine 02/17/2015 0.9  0.5 - 1.1 mg/dL Final  . Potassium 02/17/2015 3.8  3.4 - 5.3 mmol/L Final  . Sodium 02/17/2015 143  137 - 147 mmol/L Final  Admission on 02/11/2015, Discharged on 02/14/2015  Component Date Value Ref Range Status  . WBC 02/12/2015 12.9* 4.0 - 10.5 K/uL Final  . RBC 02/12/2015 3.66* 3.87 - 5.11 MIL/uL Final  . Hemoglobin 02/12/2015 11.1* 12.0 - 15.0 g/dL Final  . HCT 02/12/2015 34.2* 36.0 - 46.0 % Final  . MCV 02/12/2015 93.4  78.0 - 100.0 fL Final  . MCH 02/12/2015 30.3  26.0 - 34.0 pg Final  . MCHC 02/12/2015 32.5  30.0 - 36.0 g/dL Final  . RDW 02/12/2015 13.8  11.5 - 15.5 % Final  . Platelets 02/12/2015 206  150 - 400 K/uL Final  . Sodium 02/12/2015 136  135 - 145 mmol/L Final  . Potassium 02/12/2015 4.1  3.5 - 5.1 mmol/L Final  . Chloride 02/12/2015 102  101 - 111 mmol/L Final  . CO2 02/12/2015 26  22 - 32 mmol/L Final  . Glucose, Bld 02/12/2015 140* 65 - 99 mg/dL Final  . BUN 02/12/2015 14  6 - 20 mg/dL Final  . Creatinine, Ser 02/12/2015 0.78  0.44 - 1.00 mg/dL Final  . Calcium 02/12/2015 8.8* 8.9 - 10.3 mg/dL Final  . GFR calc non Af Amer 02/12/2015 >60  >60 mL/min Final  . GFR calc Af Amer 02/12/2015 >60  >60 mL/min Final   Comment: (NOTE) The eGFR has been calculated using the CKD EPI equation. This calculation has not been validated in all clinical situations. eGFR's persistently <60 mL/min signify possible Chronic Kidney Disease.   . Anion gap 02/12/2015 8  5 - 15 Final  . WBC 02/13/2015 12.3* 4.0 - 10.5 K/uL Final  . RBC 02/13/2015 3.78* 3.87 - 5.11 MIL/uL Final  . Hemoglobin 02/13/2015 11.8* 12.0 - 15.0 g/dL Final  . HCT  02/13/2015 35.7* 36.0 - 46.0 % Final  . MCV 02/13/2015 94.4  78.0 - 100.0 fL Final  . MCH 02/13/2015 31.2  26.0 - 34.0 pg Final  . MCHC 02/13/2015 33.1  30.0 - 36.0 g/dL Final  . RDW 02/13/2015 14.4  11.5 - 15.5 % Final  . Platelets 02/13/2015 221  150 - 400 K/uL Final  . Sodium 02/13/2015 139  135 - 145 mmol/L Final  . Potassium 02/13/2015 3.7  3.5 - 5.1 mmol/L Final  . Chloride 02/13/2015 99* 101 - 111 mmol/L Final  . CO2 02/13/2015 31  22 - 32 mmol/L Final  . Glucose, Bld 02/13/2015 140* 65 - 99 mg/dL Final  . BUN 02/13/2015 13  6 - 20 mg/dL Final  . Creatinine, Ser 02/13/2015 0.82  0.44 - 1.00 mg/dL Final  . Calcium 02/13/2015 8.9  8.9 - 10.3 mg/dL Final  . GFR calc non Af Amer 02/13/2015 >60  >60 mL/min Final  . GFR calc Af Amer 02/13/2015 >60  >60 mL/min Final   Comment: (NOTE) The eGFR has been calculated using the CKD EPI equation. This calculation has not been validated in all clinical situations. eGFR's persistently <60 mL/min signify possible Chronic Kidney Disease.   . Anion gap  02/13/2015 9  5 - 15 Final  . Color, Urine 02/13/2015 YELLOW  YELLOW Final  . APPearance 02/13/2015 CLOUDY* CLEAR Final  . Specific Gravity, Urine 02/13/2015 1.011  1.005 - 1.030 Final  . pH 02/13/2015 8.5* 5.0 - 8.0 Final  . Glucose, UA 02/13/2015 NEGATIVE  NEGATIVE mg/dL Final  . Hgb urine dipstick 02/13/2015 NEGATIVE  NEGATIVE Final  . Bilirubin Urine 02/13/2015 NEGATIVE  NEGATIVE Final  . Ketones, ur 02/13/2015 NEGATIVE  NEGATIVE mg/dL Final  . Protein, ur 02/13/2015 NEGATIVE  NEGATIVE mg/dL Final  . Urobilinogen, UA 02/13/2015 1.0  0.0 - 1.0 mg/dL Final  . Nitrite 02/13/2015 POSITIVE* NEGATIVE Final  . Leukocytes, UA 02/13/2015 NEGATIVE  NEGATIVE Final  . Specimen Description 02/13/2015 URINE, RANDOM   Final  . Special Requests 02/13/2015 Normal   Final  . Culture 02/13/2015    Final                   Value:MULTIPLE SPECIES PRESENT, SUGGEST RECOLLECTION Performed at [facility]   . Report Status 02/13/2015 02/15/2015 FINAL   Final  . Squamous Epithelial / LPF 02/13/2015 RARE  RARE Final  . WBC, UA 02/13/2015 0-2  <3 WBC/hpf Final  . Bacteria, UA 02/13/2015 MANY* RARE Final  . Urine-Other 02/13/2015 AMORPHOUS URATES/PHOSPHATES   Final  . WBC 02/14/2015 11.3* 4.0 - 10.5 K/uL Final  . RBC 02/14/2015 3.71* 3.87 - 5.11 MIL/uL Final  . Hemoglobin 02/14/2015 11.2* 12.0 - 15.0 g/dL Final  . HCT 02/14/2015 34.2* 36.0 - 46.0 % Final  . MCV 02/14/2015 92.2  78.0 - 100.0 fL Final  . MCH 02/14/2015 30.2  26.0 - 34.0 pg Final  . MCHC 02/14/2015 32.7  30.0 - 36.0 g/dL Final  . RDW 02/14/2015 14.2  11.5 - 15.5 % Final  . Platelets 02/14/2015 207  150 - 400 K/uL Final  Hospital Outpatient Visit on 02/03/2015  Component Date Value Ref Range Status  . MRSA, PCR 02/03/2015 NEGATIVE  NEGATIVE Final  . Staphylococcus aureus 02/03/2015 NEGATIVE  NEGATIVE Final   Comment:        The Xpert SA Assay (FDA approved for NASAL specimens in patients over 2 years of age), is one component of a comprehensive surveillance program.  Test performance has been validated by [facility] for patients greater than or equal to 5 year old. It is not intended to diagnose infection nor to guide or monitor treatment.   [name] aPTT 02/03/2015 25  24 - 37 seconds Final  . WBC 02/03/2015 6.3  4.0 - 10.5 K/uL Final  . RBC 02/03/2015 4.37  3.87 - 5.11 MIL/uL Final  . Hemoglobin 02/03/2015 13.3  12.0 - 15.0 g/dL Final  . HCT 02/03/2015 41.0  36.0 - 46.0 % Final  . MCV 02/03/2015 93.8  78.0 - 100.0 fL Final  . MCH 02/03/2015 30.4  26.0 - 34.0 pg Final  . MCHC 02/03/2015 32.4  30.0 - 36.0 g/dL Final  . RDW 02/03/2015 13.7  11.5 - 15.5 % Final  . Platelets 02/03/2015 197  150 - 400 K/uL Final  . Neutrophils Relative % 02/03/2015 56   Final  . Neutro Abs 02/03/2015 3.5  1.7 - 7.7 K/uL Final  . Lymphocytes Relative 02/03/2015 28   Final  . Lymphs Abs 02/03/2015 1.8  0.7 - 4.0 K/uL Final  .  Monocytes Relative 02/03/2015 13   Final  . Monocytes Absolute 02/03/2015 0.8  0.1 - 1.0 K/uL Final  . Eosinophils Relative 02/03/2015 2  Final  . Eosinophils Absolute 02/03/2015 0.2  0.0 - 0.7 K/uL Final  . Basophils Relative 02/03/2015 1   Final  . Basophils Absolute 02/03/2015 0.0  0.0 - 0.1 K/uL Final  . Sodium 02/03/2015 141  135 - 145 mmol/L Final  . Potassium 02/03/2015 3.9  3.5 - 5.1 mmol/L Final  . Chloride 02/03/2015 103  101 - 111 mmol/L Final  . CO2 02/03/2015 31  22 - 32 mmol/L Final  . Glucose, Bld 02/03/2015 101* 65 - 99 mg/dL Final  . BUN 02/03/2015 19  6 - 20 mg/dL Final  . Creatinine, Ser 02/03/2015 0.90  0.44 - 1.00 mg/dL Final  . Calcium 02/03/2015 9.6  8.9 - 10.3 mg/dL Final  . Total Protein 02/03/2015 7.0  6.5 - 8.1 g/dL Final  . Albumin 02/03/2015 4.2  3.5 - 5.0 g/dL Final  . AST 02/03/2015 26  15 - 41 U/L Final  . ALT 02/03/2015 21  14 - 54 U/L Final  . Alkaline Phosphatase 02/03/2015 73  38 - 126 U/L Final  . Total Bilirubin 02/03/2015 0.7  0.3 - 1.2 mg/dL Final  . GFR calc non Af Amer 02/03/2015 56* >60 mL/min Final  . GFR calc Af Amer 02/03/2015 >60  >60 mL/min Final   Comment: (NOTE) The eGFR has been calculated using the CKD EPI equation. This calculation has not been validated in all clinical situations. eGFR's persistently <60 mL/min signify possible Chronic Kidney Disease.   . Anion gap 02/03/2015 7  5 - 15 Final  . Prothrombin Time 02/03/2015 13.0  11.6 - 15.2 seconds Final  . INR 02/03/2015 0.96  0.00 - 1.49 Final  . ABO/RH(D) 02/03/2015 O POS   Final  . Antibody Screen 02/03/2015 NEG   Final  . Sample Expiration 02/03/2015 02/14/2015   Final  . Color, Urine 02/03/2015 YELLOW  YELLOW Final  . APPearance 02/03/2015 CLEAR  CLEAR Final  . Specific Gravity, Urine 02/03/2015 1.011  1.005 - 1.030 Final  . pH 02/03/2015 7.0  5.0 - 8.0 Final  . Glucose, UA 02/03/2015 NEGATIVE  NEGATIVE mg/dL Final  . Hgb urine dipstick 02/03/2015 NEGATIVE  NEGATIVE  Final  . Bilirubin Urine 02/03/2015 NEGATIVE  NEGATIVE Final  . Ketones, ur 02/03/2015 NEGATIVE  NEGATIVE mg/dL Final  . Protein, ur 02/03/2015 NEGATIVE  NEGATIVE mg/dL Final  . Urobilinogen, UA 02/03/2015 1.0  0.0 - 1.0 mg/dL Final  . Nitrite 02/03/2015 NEGATIVE  NEGATIVE Final  . Leukocytes, UA 02/03/2015 NEGATIVE  NEGATIVE Final   MICROSCOPIC NOT DONE ON URINES WITH NEGATIVE PROTEIN, BLOOD, LEUKOCYTES, NITRITE, OR GLUCOSE <1000 mg/dL.     X-Rays:Dg Knee 2 Views Right  03/01/2015  CLINICAL DATA:  Recent fall with pain and swelling, right knee surgery on 02/11/2015, open wound at suture site. EXAM: RIGHT KNEE - 1-2 VIEW COMPARISON:  Plain film examination of the right knee dated 12/23/2008. FINDINGS: Patient is status post total right knee arthroplasty. Hardware appears intact and well positioned. Expected joint effusion is present. No acute osseous abnormality seen. Superficial soft tissue swelling/edema may be related to the recent surgery or patient's recent fall. IMPRESSION: 1. Expected postsurgical findings status post recent right knee arthroplasty. 2. Soft tissue swelling/edema which may be related to the recent surgery or patient's recent fall. 3. No acute osseous abnormality. No osseous fracture or dislocation. Electronically Signed   By: Franki Cabot M.D.   On: 03/01/2015 12:51    EKG: Orders placed or performed in visit on 01/13/15  . EKG 12-Lead  Hospital Course: Kristin Wilkerson is a 81 y.o. who was admitted to [facility] with wound dehiscence of right total knee. She has had difficulty with ambulation since being discharged from [facility] two days ago. Her family reports that she may have fallen resulting in opening of her incision. She continues to be confusion about place and circumstances. PT worked with the patient and it was found that she required significant assistance. Plan was for DC to SNF. Due to hematoma over right knee, Plavix held and patient started on  aspirin.   Diet: Cardiac diet Activity:WBAT Follow-up:in 3 days Disposition - Skilled nursing facility Discharged Condition: stable   Discharge Instructions    Call MD / Call 911    Complete by:  As directed   If you experience chest pain or shortness of breath, CALL 911 and be transported to the hospital emergency room.  If you develope a fever above 101 F, pus (white drainage) or increased drainage or redness at the wound, or calf pain, call your surgeon's office.     Constipation Prevention    Complete by:  As directed   Drink plenty of fluids.  Prune juice may be helpful.  You may use a stool softener, such as Colace (over the counter) 100 mg twice a day.  Use MiraLax (over the counter) for constipation as needed.     Diet - low sodium heart healthy    Complete by:  As directed      Discharge instructions    Complete by:  As directed   WBAT Keep dressing on right knee until follow-up with Dr. Gladstone Lighter Continue doxycyline Stop Plavix. Start aspirin     Increase activity slowly as tolerated    Complete by:  As directed             Medication List    STOP taking these medications        ciprofloxacin 500 MG tablet  Commonly known as:  CIPRO     clopidogrel 75 MG tablet  Commonly known as:  PLAVIX     clotrimazole 1 % cream  Commonly known as:  LOTRIMIN     HYDROcodone-acetaminophen 5-325 MG tablet  Commonly known as:  NORCO/VICODIN     rivaroxaban 10 MG Tabs tablet  Commonly known as:  XARELTO      TAKE these medications        acetaminophen 500 MG tablet  Commonly known as:  TYLENOL  Take 1,000 mg by mouth daily as needed for moderate pain.     aspirin 325 MG EC tablet  Take 1 tablet (325 mg total) by mouth 2 (two) times daily.     atorvastatin 20 MG tablet  Commonly known as:  LIPITOR  TAKE 1 TABLET BY MOUTH EVERY EVENING TO CONTROL CHOLESTEROL     Calcium Carbonate-Vitamin D 600-400 MG-UNIT tablet  Commonly known as:  CALCIUM 600+D  Take one tablet  by mouth daily for calcium supplement     cetirizine 10 MG tablet  Commonly known as:  ZYRTEC  Take 1 tablet by mouth daily.     cholecalciferol 1000 UNITS tablet  Commonly known as:  VITAMIN D  Take 1,000 Units by mouth daily.     doxycycline 100 MG tablet  Commonly known as:  VIBRA-TABS  Take 1 tablet (100 mg total) by mouth 2 (two) times daily.     DULoxetine 30 MG capsule  Commonly known as:  CYMBALTA  One daily to help nerves  furosemide 20 MG tablet  Commonly known as:  LASIX  Take one every other day for diuretic     latanoprost 0.005 % ophthalmic solution  Commonly known as:  XALATAN  PLACE 1 DROP IN BOTH EYES AT BEDTIME TO TREAT GLAUCOMA     losartan 100 MG tablet  Commonly known as:  COZAAR  TAKE 1 TABLET BY MOUTH ONCE DAILY FOR BLOOD PRESSURE     metoprolol 100 MG tablet  Commonly known as:  LOPRESSOR  TAKE 1 TABLET BY MOUTH TWICE DAILY TO CONTORL BLOOD PRESSURE     polyethylene glycol packet  Commonly known as:  MIRALAX / GLYCOLAX  Take 17 g by mouth 2 (two) times daily.     sennosides-docusate sodium 8.6-50 MG tablet  Commonly known as:  SENOKOT-S  Take 2 tablets by mouth 2 (two) times daily.     traMADol 50 MG tablet  Commonly known as:  ULTRAM  Take 1-2 tablets (50-100 mg total) by mouth every 6 (six) hours as needed for moderate pain.           Follow-up Information    Follow up with GIOFFRE,RONALD A, MD. Schedule an appointment as soon as possible for a visit on 03/05/2015.   Specialty:  Orthopedic Surgery   Contact information:   [address] [place] [address] [phone]       Signed: Ardeen Jourdain, PA-C Orthopaedic Surgery 03/02/2015, 9:00 AM

## 2015-03-02 NOTE — NC FL2 (Signed)
[place] MEDICAID FL2 LEVEL OF CARE SCREENING TOOL     IDENTIFICATION  Patient Name: Kristin Bullocksnn T Olesky Birthdate: 01/21/1926 Sex: female Admission Date (Current Location): 03/01/2015  [facility] [place]     Facility and Address:         Provider Number: [id number]  Attending Physician Name and Address:  Ranee Gosselinonald Gioffre, MD  Relative Name and Phone Number:       Current Level of Care:   Recommended Level of Care: Nursing Facility Prior Approval Number:    Date Approved/Denied:   PASRR Number:    Discharge Plan:      Current Diagnoses: Patient Active Problem List   Diagnosis Date Noted  . Postoperative wound dehiscence 03/01/2015  . History of total knee arthroplasty 02/11/2015  . 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 Monilial vulvovaginitis 09/24/2014  . Cough 09/24/2014  . Tracheobronchitis 08/06/2014  . Deaf 07/02/2014  . Right knee pain 07/02/2014  . History of shingles 2015 02/14/2014  . Depression, major, recurrent, moderate (HCC) 01/07/2014  . Grief 11/27/2013  . Left arm pain 11/06/2013  . Corn of foot 10/23/2013  . Leg edema 09/03/2013  . Insomnia 07/02/2013  . Diastolic CHF (HCC) 03/02/2013  . Pulmonary hypertension (HCC) 03/02/2013  . History of CVA (cerebrovascular accident) 03/01/2013  . HTN (hypertension) 03/01/2013  . HLD (hyperlipidemia) 03/01/2013  . Tachycardia 12/10/2012  . Anxiety 12/10/2012  . Myalgia and myositis, unspecified 09/18/2012  . Other malaise and fatigue 09/18/2012  . Backache 03/31/2008    Orientation ACTIVITIES/SOCIAL BLADDER RESPIRATION    Self, Situation, Place         BEHAVIORAL SYMPTOMS/MOOD NEUROLOGICAL BOWEL NUTRITION STATUS   (no behaviors)        PHYSICIAN VISITS COMMUNICATION OF NEEDS Height & Weight Skin                     AMBULATORY STATUS RESPIRATION             Personal Care Assistance Level of Assistance               Functional Limitations Info                SPECIAL CARE  FACTORS FREQUENCY                      Additional Factors Info                  Current Medications (03/02/2015): Current Facility-Administered Medications  Medication Dose Route Frequency Provider Last Rate Last Dose  . 0.9 %  sodium chloride infusion  250 mL Intravenous PRN Amber Constable, PA-C      . acetaminophen (TYLENOL) tablet 650 mg  650 mg Oral Q6H PRN Dimitri PedAmber Constable, PA-C   650 mg at 03/01/15 2128   Or  . acetaminophen (TYLENOL) suppository 650 mg  650 mg Rectal Q6H PRN Amber Constable, PA-C      . alum & mag hydroxide-simeth (MAALOX/MYLANTA) 200-200-20 MG/5ML suspension 30 mL  30 mL Oral Q6H PRN Amber Constable, PA-C      . aspirin EC tablet 325 mg  325 mg Oral BID Amber Constable, PA-C      . atorvastatin (LIPITOR) tablet 20 mg  20 mg Oral q1800 Amber Constable, PA-C      . bisacodyl (DULCOLAX) suppository 10 mg  10 mg Rectal Daily PRN Amber Constable, PA-C      . doxycycline (VIBRA-TABS) tablet 100 mg  100 mg Oral Q12H  Tilden Fossa, MD   100 mg at 03/01/15 2129  . DULoxetine (CYMBALTA) DR capsule 30 mg  30 mg Oral Daily Amber Constable, PA-C      . furosemide (LASIX) tablet 20 mg  20 mg Oral Daily Marriott, PA-C      . HYDROcodone-acetaminophen (NORCO/VICODIN) 5-325 MG per tablet 1-2 tablet  1-2 tablet Oral Q4H PRN Amber Constable, PA-C      . latanoprost (XALATAN) 0.005 % ophthalmic solution 1 drop  1 drop Both Eyes QHS Amber Constable, PA-C   1 drop at 03/01/15 2131  . losartan (COZAAR) tablet 100 mg  100 mg Oral Daily Amber Constable, PA-C      . metoprolol (LOPRESSOR) tablet 100 mg  100 mg Oral BID Amber Constable, PA-C   100 mg at 03/01/15 2130  . ondansetron (ZOFRAN) tablet 4 mg  4 mg Oral Q6H PRN Amber Constable, PA-C       Or  . ondansetron (ZOFRAN) injection 4 mg  4 mg Intravenous Q6H PRN Amber Constable, PA-C      . polyethylene glycol (MIRALAX / GLYCOLAX) packet 17 g  17 g Oral Daily PRN Amber Constable, PA-C      . polyethylene glycol  (MIRALAX / GLYCOLAX) packet 17 g  17 g Oral BID Amber Constable, PA-C   17 g at 03/01/15 2130  . povidone-iodine (BETADINE) 10 % external solution   Topical PRN Ranee Gosselin, MD      . senna-docusate (Senokot-S) tablet 2 tablet  2 tablet Oral BID Dimitri Ped, PA-C   2 tablet at 03/01/15 2129  . sodium chloride 0.9 % injection 3 mL  3 mL Intravenous Q12H Amber Constable, PA-C   3 mL at 03/01/15 2132  . sodium chloride 0.9 % injection 3 mL  3 mL Intravenous PRN Amber Constable, PA-C      . sodium phosphate (FLEET) 7-19 GM/118ML enema 1 enema  1 enema Rectal Once PRN Amber Constable, PA-C       Do not use this list as official medication orders. Please verify with discharge summary.  Discharge Medications:   Medication List    STOP taking these medications        ciprofloxacin 500 MG tablet  Commonly known as:  CIPRO     clopidogrel 75 MG tablet  Commonly known as:  PLAVIX     clotrimazole 1 % cream  Commonly known as:  LOTRIMIN     HYDROcodone-acetaminophen 5-325 MG tablet  Commonly known as:  NORCO/VICODIN     rivaroxaban 10 MG Tabs tablet  Commonly known as:  XARELTO      TAKE these medications        acetaminophen 500 MG tablet  Commonly known as:  TYLENOL  Take 1,000 mg by mouth daily as needed for moderate pain.     aspirin 325 MG EC tablet  Take 1 tablet (325 mg total) by mouth 2 (two) times daily.     atorvastatin 20 MG tablet  Commonly known as:  LIPITOR  TAKE 1 TABLET BY MOUTH EVERY EVENING TO CONTROL CHOLESTEROL     Calcium Carbonate-Vitamin D 600-400 MG-UNIT tablet  Commonly known as:  CALCIUM 600+D  Take one tablet by mouth daily for calcium supplement     cetirizine 10 MG tablet  Commonly known as:  ZYRTEC  Take 1 tablet by mouth daily.     cholecalciferol 1000 UNITS tablet  Commonly known as:  VITAMIN D  Take 1,000 Units by mouth daily.  doxycycline 100 MG tablet  Commonly known as:  VIBRA-TABS  Take 1 tablet (100 mg total) by mouth 2  (two) times daily.     DULoxetine 30 MG capsule  Commonly known as:  CYMBALTA  One daily to help nerves     furosemide 20 MG tablet  Commonly known as:  LASIX  Take one every other day for diuretic     latanoprost 0.005 % ophthalmic solution  Commonly known as:  XALATAN  PLACE 1 DROP IN BOTH EYES AT BEDTIME TO TREAT GLAUCOMA     losartan 100 MG tablet  Commonly known as:  COZAAR  TAKE 1 TABLET BY MOUTH ONCE DAILY FOR BLOOD PRESSURE     metoprolol 100 MG tablet  Commonly known as:  LOPRESSOR  TAKE 1 TABLET BY MOUTH TWICE DAILY TO CONTORL BLOOD PRESSURE     polyethylene glycol packet  Commonly known as:  MIRALAX / GLYCOLAX  Take 17 g by mouth 2 (two) times daily.     sennosides-docusate sodium 8.6-50 MG tablet  Commonly known as:  SENOKOT-S  Take 2 tablets by mouth 2 (two) times daily.     traMADol 50 MG tablet  Commonly known as:  ULTRAM  Take 1-2 tablets (50-100 mg total) by mouth every 6 (six) hours as needed for moderate pain.        Relevant Imaging Results:  Relevant Lab Results:  Recent Labs    Additional Information SS # [id number]  Minela [place], Dickey Gave, LCSW

## 2015-03-02 NOTE — Clinical Social Work Placement (Signed)
   CLINICAL SOCIAL WORK PLACEMENT  NOTE  Date:  03/02/2015  Patient Details  Name: Kristin Wilkerson MRN: [id number] Date of Birth: April 15, 1926  Clinical Social Work is seeking post-discharge placement for this patient at the Skilled  Nursing Facility level of care (*CSW will initial, date and re-position this form in  chart as items are completed):  Yes   Patient/family provided with [facility] Clinical Social Work Department's list of facilities offering this level of care within the geographic area requested by the patient (or if unable, by the patient's family).  Yes   Patient/family informed of their freedom to choose among providers that offer the needed level of care, that participate in Medicare, Medicaid or managed care program needed by the patient, have an available bed and are willing to accept the patient.  Yes   Patient/family informed of [facility]'s ownership interest in [facility] and [facility], as well as of the fact that they are under no obligation to receive care at these facilities.  PASRR submitted to EDS on       PASRR number received on       Existing PASRR number confirmed on 03/01/15     FL2 transmitted to all facilities in geographic area requested by pt/family on 03/02/15     FL2 transmitted to all facilities within larger geographic area on       Patient informed that his/her managed care company has contracts with or will negotiate with certain facilities, including the following:        Yes   Patient/family informed of bed offers received.  [facility]     Physician recommends and patient chooses bed at      Patient to be transferred to   on 03/02/15.  Patient to be transferred to facility by [facility]     Patient family notified on 03/02/15 of transfer.  Name of family member notified:  SON     PHYSICIAN       Additional Comment: Pt / family [facility]. [facility] transport is required.  Pt / family are aware out of pocket costs may be associated with [facility] transport. NSG reviewed d/c summary, scripts, avs. Scripts included in d/c packet.   _______________________________________________ Royetta AsalHaidinger, Akilah Cureton Lee, LCSW 03/02/2015, 2:49 PM

## 2015-03-02 NOTE — NC FL2 (Signed)
[place] MEDICAID FL2 LEVEL OF CARE SCREENING TOOL     IDENTIFICATION  Patient Name: Kristin Wilkerson Birthdate: 11-03-1925 Sex: female Admission Date (Current Location): 03/01/2015  [facility] and [place] Number:     Facility and Address:  [facility],  [address] [place] [address]      Provider Number: [id number]  Attending Physician Name and Address:  Ranee Gosselin, MD  Relative Name and Phone Number:       Current Level of Care: Hospital Recommended Level of Care: Skilled Nursing Facility Prior Approval Number:    Date Approved/Denied:   PASRR Number:    Discharge Plan: SNF    Current Diagnoses: Patient Active Problem List   Diagnosis Date Noted  . Postoperative wound dehiscence 03/01/2015  . History of total knee arthroplasty 02/11/2015  . 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 Monilial vulvovaginitis 09/24/2014  . Cough 09/24/2014  . Tracheobronchitis 08/06/2014  . Deaf 07/02/2014  . Right knee pain 07/02/2014  . History of shingles 2015 02/14/2014  . Depression, major, recurrent, moderate (HCC) 01/07/2014  . Grief 11/27/2013  . Left arm pain 11/06/2013  . Corn of foot 10/23/2013  . Leg edema 09/03/2013  . Insomnia 07/02/2013  . Diastolic CHF (HCC) 03/02/2013  . Pulmonary hypertension (HCC) 03/02/2013  . History of CVA (cerebrovascular accident) 03/01/2013  . HTN (hypertension) 03/01/2013  . HLD (hyperlipidemia) 03/01/2013  . Tachycardia 12/10/2012  . Anxiety 12/10/2012  . Myalgia and myositis, unspecified 09/18/2012  . Other malaise and fatigue 09/18/2012  . Backache 03/31/2008    Orientation ACTIVITIES/SOCIAL BLADDER RESPIRATION    Self, Situation, Place  Passive Continent Normal  BEHAVIORAL SYMPTOMS/MOOD NEUROLOGICAL BOWEL NUTRITION STATUS   (no behaviors)   Continent Diet  PHYSICIAN VISITS COMMUNICATION OF NEEDS Height & Weight Skin    Verbally 5' (152.4 cm) 166 lbs. Surgical wounds          AMBULATORY STATUS RESPIRATION     Assist extensive Normal      Personal Care Assistance Level of Assistance  Bathing, Dressing Bathing Assistance: Limited assistance   Dressing Assistance: Limited assistance      Functional Limitations Info                SPECIAL CARE FACTORS FREQUENCY  PT (By licensed PT)     PT Frequency: daily             Additional Factors Info  Code Status Code Status Info: Full Code             Current Medications (03/02/2015): Current Facility-Administered Medications  Medication Dose Route Frequency Provider Last Rate Last Dose  . 0.9 %  sodium chloride infusion  250 mL Intravenous PRN Amber Constable, PA-C      . acetaminophen (TYLENOL) tablet 650 mg  650 mg Oral Q6H PRN Dimitri Ped, PA-C   650 mg at 03/01/15 2128   Or  . acetaminophen (TYLENOL) suppository 650 mg  650 mg Rectal Q6H PRN Amber Constable, PA-C      . alum & mag hydroxide-simeth (MAALOX/MYLANTA) 200-200-20 MG/5ML suspension 30 mL  30 mL Oral Q6H PRN Amber Constable, PA-C      . aspirin EC tablet 325 mg  325 mg Oral BID Amber Constable, PA-C      . atorvastatin (LIPITOR) tablet 20 mg  20 mg Oral q1800 Amber Constable, PA-C      . bisacodyl (DULCOLAX) suppository 10 mg  10 mg Rectal Daily PRN Marriott, PA-C      .  doxycycline (VIBRA-TABS) tablet 100 mg  100 mg Oral Q12H Tilden Fossa, MD   100 mg at 03/01/15 2129  . DULoxetine (CYMBALTA) DR capsule 30 mg  30 mg Oral Daily Amber Constable, PA-C      . furosemide (LASIX) tablet 20 mg  20 mg Oral Daily Marriott, PA-C      . HYDROcodone-acetaminophen (NORCO/VICODIN) 5-325 MG per tablet 1-2 tablet  1-2 tablet Oral Q4H PRN Amber Constable, PA-C      . latanoprost (XALATAN) 0.005 % ophthalmic solution 1 drop  1 drop Both Eyes QHS Amber Constable, PA-C   1 drop at 03/01/15 2131  . losartan (COZAAR) tablet 100 mg  100 mg Oral Daily Amber Constable, PA-C      . metoprolol (LOPRESSOR) tablet 100 mg  100 mg Oral BID Amber Constable, PA-C   100 mg at  03/01/15 2130  . ondansetron (ZOFRAN) tablet 4 mg  4 mg Oral Q6H PRN Amber Constable, PA-C       Or  . ondansetron (ZOFRAN) injection 4 mg  4 mg Intravenous Q6H PRN Amber Constable, PA-C      . polyethylene glycol (MIRALAX / GLYCOLAX) packet 17 g  17 g Oral Daily PRN Amber Constable, PA-C      . polyethylene glycol (MIRALAX / GLYCOLAX) packet 17 g  17 g Oral BID Amber Constable, PA-C   17 g at 03/01/15 2130  . povidone-iodine (BETADINE) 10 % external solution   Topical PRN Ranee Gosselin, MD      . senna-docusate (Senokot-S) tablet 2 tablet  2 tablet Oral BID Dimitri Ped, PA-C   2 tablet at 03/01/15 2129  . sodium chloride 0.9 % injection 3 mL  3 mL Intravenous Q12H Amber Constable, PA-C   3 mL at 03/01/15 2132  . sodium chloride 0.9 % injection 3 mL  3 mL Intravenous PRN Amber Constable, PA-C      . sodium phosphate (FLEET) 7-19 GM/118ML enema 1 enema  1 enema Rectal Once PRN Amber Constable, PA-C       Do not use this list as official medication orders. Please verify with discharge summary.  Discharge Medications:   Medication List    STOP taking these medications        ciprofloxacin 500 MG tablet  Commonly known as:  CIPRO     clopidogrel 75 MG tablet  Commonly known as:  PLAVIX     clotrimazole 1 % cream  Commonly known as:  LOTRIMIN     HYDROcodone-acetaminophen 5-325 MG tablet  Commonly known as:  NORCO/VICODIN     rivaroxaban 10 MG Tabs tablet  Commonly known as:  XARELTO      TAKE these medications        acetaminophen 500 MG tablet  Commonly known as:  TYLENOL  Take 1,000 mg by mouth daily as needed for moderate pain.     aspirin 325 MG EC tablet  Take 1 tablet (325 mg total) by mouth 2 (two) times daily.     atorvastatin 20 MG tablet  Commonly known as:  LIPITOR  TAKE 1 TABLET BY MOUTH EVERY EVENING TO CONTROL CHOLESTEROL     Calcium Carbonate-Vitamin D 600-400 MG-UNIT tablet  Commonly known as:  CALCIUM 600+D  Take one tablet by mouth daily for  calcium supplement     cetirizine 10 MG tablet  Commonly known as:  ZYRTEC  Take 1 tablet by mouth daily.     cholecalciferol 1000 UNITS tablet  Commonly known as:  VITAMIN D  Take 1,000 Units by mouth daily.     doxycycline 100 MG tablet  Commonly known as:  VIBRA-TABS  Take 1 tablet (100 mg total) by mouth 2 (two) times daily.     DULoxetine 30 MG capsule  Commonly known as:  CYMBALTA  One daily to help nerves     furosemide 20 MG tablet  Commonly known as:  LASIX  Take one every other day for diuretic     latanoprost 0.005 % ophthalmic solution  Commonly known as:  XALATAN  PLACE 1 DROP IN BOTH EYES AT BEDTIME TO TREAT GLAUCOMA     losartan 100 MG tablet  Commonly known as:  COZAAR  TAKE 1 TABLET BY MOUTH ONCE DAILY FOR BLOOD PRESSURE     metoprolol 100 MG tablet  Commonly known as:  LOPRESSOR  TAKE 1 TABLET BY MOUTH TWICE DAILY TO CONTORL BLOOD PRESSURE     polyethylene glycol packet  Commonly known as:  MIRALAX / GLYCOLAX  Take 17 g by mouth 2 (two) times daily.     sennosides-docusate sodium 8.6-50 MG tablet  Commonly known as:  SENOKOT-S  Take 2 tablets by mouth 2 (two) times daily.     traMADol 50 MG tablet  Commonly known as:  ULTRAM  Take 1-2 tablets (50-100 mg total) by mouth every 6 (six) hours as needed for moderate pain.        Relevant Imaging Results:  Relevant Lab Results:  Recent Labs    Additional Information SS # [id number]  Hersel Mcmeen, Dickey GaveJamie Lee, LCSW

## 2015-03-02 NOTE — Discharge Instructions (Signed)
WBAT Keep dressing on right knee until follow-up with Dr. [name] Continue doxycyline Stop Plavix. Start aspirin

## 2015-03-02 NOTE — Progress Notes (Signed)
Subjective: 3 weeks S/P right total knee arthroplasty Patient reports pain as mild.   Patient seen in rounds for Dr. Darrelyn Hillock. Patient is not complainig of pain, SOB, or chest pain. She reports limited ability to walk. .   Objective: Vital signs in last 24 hours: Temp:  [97.6 F (36.4 C)-98.2 F (36.8 C)] 98.2 F (36.8 C) (11/14 0520) Pulse Rate:  [55-98] 98 (11/14 0520) Resp:  [14-16] 15 (11/14 0520) BP: (74-172)/(52-92) 155/65 mmHg (11/14 0520) SpO2:  [92 %-100 %] 97 % (11/14 0520) Weight:  [72.576 kg (160 lb)] 72.576 kg (160 lb) (11/13 1149)  Intake/Output from previous day:  Intake/Output Summary (Last 24 hours) at 03/02/15 0847 Last data filed at 03/02/15 0648  Gross per 24 hour  Intake    123 ml  Output      0 ml  Net    123 ml     Labs:  Recent Labs  03/01/15 1221 03/02/15 0450  HGB 9.9* 9.2*    Recent Labs  03/01/15 1221 03/02/15 0450  WBC 10.1 7.0  RBC 3.31* 3.19*  HCT 30.7* 29.1*  PLT 474* 414*    Recent Labs  03/01/15 1221 03/02/15 0450  NA 141 139  K 3.2* 3.1*  CL 107 106  CO2 23 25  BUN 18 19  CREATININE 0.89 0.77  GLUCOSE 111* 102*  CALCIUM 9.1 8.6*    Recent Labs  03/02/15 0450  INR 1.15    EXAM General - Patient is Confused.  Extremity - Neurovascular intact Dorsiflexion/Plantar flexion intact Compartment soft Dressing- clean, dry; compressive dressing kept in place Motor Function - intact, moving foot and toes well on exam.   Past Medical History  Diagnosis Date  . Hypertension   . Abnormality of gait   . Disturbance of skin sensation   . Tachycardia, unspecified   . Internal hemorrhoids without mention of complication   . Synovial cyst of popliteal space   . Spinal stenosis, other region   . Sciatica   . Lumbago   . Unspecified vitamin D deficiency   . Anxiety   . Hyperlipidemia   . Abnormality of gait   . Unspecified sinusitis (chronic)   . Unspecified pruritic disorder   . Pain in joint, lower leg   .  Depressive disorder, not elsewhere classified   . Unspecified glaucoma   . Reflux esophagitis   . Diverticulosis of colon (without mention of hemorrhage)   . Pain in joint, site unspecified   . Myalgia and myositis, unspecified   . Osteoporosis, unspecified   . Other malaise and fatigue   . Cervicalgia   . Benign neoplasm of colon   . H/O echocardiogram 09/01/11    EF >55%, mild MR, RV systolic pressure elevated at 16-10 mmHg, mod TR  . Normal cardiac stress test 11/16/09    low risk scan, EF  77%  . Shortness of breath   . Stroke [facility]) "a couple of years ago"     visual problems left eye  . Arthritis   . Swelling of both ankles     rt leg  . Bruises easily     Assessment/Plan: Active Problems:   Postoperative wound dehiscence S/P right total knee arthroplasty  Estimated body mass index is 31.25 kg/(m^2) as calculated from the following:   Height as of this encounter: 5' (1.524 m).   Weight as of this encounter: 72.576 kg (160 lb). Advance diet Up with therapy Discharge to SNF  DVT Prophylaxis - Aspirin  Weight-Bearing as tolerated  She continues to be confused about where she is and who is caring for her. There was some concern about her ability to walk at home. Her family reports that she needed significant assistance. Will DC Plavix and start on aspirin . Maintain dressing until follow up with Dr. [name] later this week. Plan to DC to SNF when bed available.  Dimitri PedAmber Emilyn Ruble, PA-C Orthopaedic Surgery 03/02/2015, 8:47 AM

## 2015-03-03 ENCOUNTER — Encounter: Payer: Self-pay | Admitting: Adult Health

## 2015-03-03 ENCOUNTER — Non-Acute Institutional Stay (SKILLED_NURSING_FACILITY): Payer: Medicare Other | Admitting: Adult Health

## 2015-03-03 DIAGNOSIS — F331 Major depressive disorder, recurrent, moderate: Secondary | ICD-10-CM

## 2015-03-03 DIAGNOSIS — E43 Unspecified severe protein-calorie malnutrition: Secondary | ICD-10-CM | POA: Diagnosis not present

## 2015-03-03 DIAGNOSIS — K5900 Constipation, unspecified: Secondary | ICD-10-CM | POA: Diagnosis not present

## 2015-03-03 DIAGNOSIS — I10 Essential (primary) hypertension: Secondary | ICD-10-CM

## 2015-03-03 DIAGNOSIS — E785 Hyperlipidemia, unspecified: Secondary | ICD-10-CM

## 2015-03-03 DIAGNOSIS — I5032 Chronic diastolic (congestive) heart failure: Secondary | ICD-10-CM

## 2015-03-03 DIAGNOSIS — J309 Allergic rhinitis, unspecified: Secondary | ICD-10-CM

## 2015-03-03 DIAGNOSIS — M1711 Unilateral primary osteoarthritis, right knee: Secondary | ICD-10-CM

## 2015-03-03 DIAGNOSIS — T8131XS Disruption of external operation (surgical) wound, not elsewhere classified, sequela: Secondary | ICD-10-CM

## 2015-03-03 NOTE — Progress Notes (Signed)
Patient ID: Kristin Wilkerson, female   DOB: December 27, 1925, 81 y.o.   MRN: [id number]    DATE:  03/03/2015   MRN:  [id number]  BIRTHDAY: [birth year]  Facility:  Nursing Home Location:  [facility]  Nursing Home Room Number: 1204-P  LEVEL OF CARE:  SNF [phone])  Contact Information    Name Relation Home Work Mobile   Richoux,[name] Daughter [phone]     Shawnya, Mayor [phone]  [phone]   Rich Reining   [phone]      Chief Complaint  Patient presents with  . Hospitalization Follow-up    Wound dehiscence, osteoarthritis S/P right total knee arthroplasty, hyperlipidemia, allergic rhinitis, depression, chronic diastolic CHF, hypertension, constipation and protein calorie malnutrition    HISTORY OF PRESENT ILLNESS:  This is an 81 year old female who Has been readmitted to [facility] on 03/02/15 from [facility]. She has PMH of hypertension, spinal stenosis, sciatica, lumbago, anxiety, hyperlipidemia, depression, reflux esophagitis, diverticulosis, osteoporosis, benign neoplasm of colon and stroke. She was recently discharged from [facility] after having a short-term rehabilitation. She had a wound dehiscence of a recent right total knee arthroplasty. Family reported that she may have fallen resulting in opening of her incision.  He has been admitted for a short-term rehabilitation.  PAST MEDICAL HISTORY:  Past Medical History  Diagnosis Date  . Hypertension   . Abnormality of gait   . Disturbance of skin sensation   . Tachycardia, unspecified   . Internal hemorrhoids without mention of complication   . Synovial cyst of popliteal space   . Spinal stenosis, other region   . Sciatica   . Lumbago   . Unspecified vitamin D deficiency   . Anxiety   . Hyperlipidemia   . Abnormality of gait   . Unspecified sinusitis (chronic)   . Unspecified pruritic disorder   . Pain in joint, lower leg   . Depressive disorder, not elsewhere classified     . Unspecified glaucoma   . Reflux esophagitis   . Diverticulosis of colon (without mention of hemorrhage)   . Pain in joint, site unspecified   . Myalgia and myositis, unspecified   . Osteoporosis, unspecified   . Other malaise and fatigue   . Cervicalgia   . Benign neoplasm of colon   . H/O echocardiogram 09/01/11    EF >55%, mild MR, RV systolic pressure elevated at 03-47 mmHg, mod TR  . Normal cardiac stress test 11/16/09    low risk scan, EF  77%  . Shortness of breath   . Stroke [facility]) "a couple of years ago"     visual problems left eye  . Arthritis   . Swelling of both ankles     rt leg  . Bruises easily      CURRENT MEDICATIONS: Reviewed  Patient's Medications  New Prescriptions   No medications on file  Previous Medications   ACETAMINOPHEN (TYLENOL) 500 MG TABLET    Take 1,000 mg by mouth daily as needed for moderate pain.   ASPIRIN EC 325 MG EC TABLET    Take 1 tablet (325 mg total) by mouth 2 (two) times daily.   ATORVASTATIN (LIPITOR) 20 MG TABLET    TAKE 1 TABLET BY MOUTH EVERY EVENING TO CONTROL CHOLESTEROL   CALCIUM CARBONATE-VITAMIN D (CALCIUM 600+D) 600-400 MG-UNIT PER TABLET    Take one tablet by mouth daily for calcium supplement   CETIRIZINE (ZYRTEC) 10 MG TABLET    Take 1 tablet by mouth daily.  CHOLECALCIFEROL (VITAMIN D) 1000 UNITS TABLET    Take 1,000 Units by mouth daily.        DULOXETINE (CYMBALTA) 30 MG CAPSULE    One daily to help nerves   FUROSEMIDE (LASIX) 20 MG TABLET    Take one every other day for diuretic   LATANOPROST (XALATAN) 0.005 % OPHTHALMIC SOLUTION    PLACE 1 DROP IN BOTH EYES AT BEDTIME TO TREAT GLAUCOMA   LOSARTAN (COZAAR) 100 MG TABLET    TAKE 1 TABLET BY MOUTH ONCE DAILY FOR BLOOD PRESSURE   METOPROLOL (LOPRESSOR) 100 MG TABLET    TAKE 1 TABLET BY MOUTH TWICE DAILY TO CONTORL BLOOD PRESSURE   POLYETHYLENE GLYCOL (MIRALAX / GLYCOLAX) PACKET    Take 17 g by mouth 2 (two) times daily.   SENNOSIDES-DOCUSATE SODIUM (SENOKOT-S) 8.6-50  MG TABLET    Take 2 tablets by mouth 2 (two) times daily.   TRAMADOL (ULTRAM) 50 MG TABLET    Take 1-2 tablets (50-100 mg total) by mouth every 6 (six) hours as needed for moderate pain.  Modified Medications   No medications on file  Discontinued Medications   No medications on file     No Known Allergies   REVIEW OF SYSTEMS:  GENERAL: no change in appetite, no fatigue, no weight changes, no fever, chills or weakness EYES: Denies change in vision, dry eyes, eye pain, itching or discharge EARS: Denies change in hearing, ringing in ears, or earache NOSE: Denies nasal congestion or epistaxis MOUTH and THROAT: Denies oral discomfort, gingival pain or bleeding, pain from teeth or hoarseness   RESPIRATORY: no cough, SOB, DOE, wheezing, hemoptysis CARDIAC: no chest pain, edema or palpitations GI: no abdominal pain, diarrhea, constipation, heart burn, nausea or vomiting GU: Denies dysuria, frequency, hematuria, incontinence, or discharge PSYCHIATRIC: Denies feeling of depression or anxiety. No report of hallucinations, insomnia, paranoia, or agitation   PHYSICAL EXAMINATION  GENERAL APPEARANCE: Well nourished. In no acute distress. Normal body habitus SKIN:  Right knee surgical incision has open area, dressing has dry blood HEAD: Normal in size and contour. No evidence of trauma EYES: Lids open and close normally. No blepharitis, entropion or ectropion. PERRL. Conjunctivae are clear and sclerae are white. Lenses are without opacity EARS: Pinnae are normal. Patient hears normal voice tunes of the examiner MOUTH and THROAT: Lips are without lesions. Oral mucosa is moist and without lesions. Tongue is normal in shape, size, and color and without lesions NECK: supple, trachea midline, no neck masses, no thyroid tenderness, no thyromegaly LYMPHATICS: no LAN in the neck, no supraclavicular LAN RESPIRATORY: breathing is even & unlabored, BS CTAB CARDIAC: RRR, no murmur,no extra heart sounds,  RLE edema 2+ GI: abdomen soft, normal BS, no masses, no tenderness, no hepatomegaly, no splenomegaly EXTREMITIES:  Able to move X 4 extremities; has right knee immobilizer PSYCHIATRIC: Alert and oriented X 3. Affect and behavior are appropriate  LABS/RADIOLOGY: Labs reviewed: Basic Metabolic Panel:  Recent Labs  40/98/1110/28/16 1118 02/17/15 03/01/15 1221 03/02/15 0450  NA 139 143 141 139  K 3.7 3.8 3.2* 3.1*  CL 99*  --  107 106  CO2 31  --  23 25  GLUCOSE 140*  --  111* 102*  BUN 13 32* 18 19  CREATININE 0.82 0.9 0.89 0.77  CALCIUM 8.9  --  9.1 8.6*   Liver Function Tests:  Recent Labs  02/03/15 1525 03/02/15 0450  AST 26 19  ALT 21 12*  ALKPHOS 73 83  BILITOT 0.7 1.0  PROT 7.0 5.7*  ALBUMIN 4.2 2.7*   CBC:  Recent Labs  02/03/15 1525  02/14/15 0615 02/17/15 03/01/15 1221 03/02/15 0450  WBC 6.3  < > 11.3* 9.1 10.1 7.0  NEUTROABS 3.5  --   --   --  7.9* 4.3  HGB 13.3  < > 11.2* 10.6* 9.9* 9.2*  HCT 41.0  < > 34.2* 33* 30.7* 29.1*  MCV 93.8  < > 92.2  --  92.7 91.2  PLT 197  < > 207 295 474* 414*  < > = values in this interval not displayed.   Dg Knee 2 Views Right  03/01/2015  CLINICAL DATA:  Recent fall with pain and swelling, right knee surgery on 02/11/2015, open wound at suture site. EXAM: RIGHT KNEE - 1-2 VIEW COMPARISON:  Plain film examination of the right knee dated 12/23/2008. FINDINGS: Patient is status post total right knee arthroplasty. Hardware appears intact and well positioned. Expected joint effusion is present. No acute osseous abnormality seen. Superficial soft tissue swelling/edema may be related to the recent surgery or patient's recent fall. IMPRESSION: 1. Expected postsurgical findings status post recent right knee arthroplasty. 2. Soft tissue swelling/edema which may be related to the recent surgery or patient's recent fall. 3. No acute osseous abnormality. No osseous fracture or dislocation. Electronically Signed   By: Bary Richard M.D.   On:  03/01/2015 12:51    ASSESSMENT/PLAN:  Wound dehiscence - follow-up with Dr. Darrelyn Hillock, orthopedic surgeon, ASAP. She has an appointment for 03/05/15 but it would be better if a sooner appointment is available since wound has a big opening on the surgical incision.; Check CBC  Osteoarthritis S/P right total knee arthroplasty - WBAT; continue aspirin 325 mg EC 1 tab by mouth twice a day for DVT prophylaxis; continue acetaminophen 500 mg take 2 tablets = 1000 mg by mouth daily when necessary and tramadol 50 mg 1-2 tabs by mouth every 6 hours when necessary for pain  Hyperlipidemia - continue atorvastatin 20 mg 1 tab by mouth every evening  Allergic rhinitis - continue Zyrtec 10 mg 1 tab by mouth daily  Depression - mood is stable; continue Cymbalta 30 mg 1 capsule by mouth daily  Chronic diastolic CHF - continue Lasix 20 mg 1 tab by mouth every other day; check BMP  Hypertension - well controlled; continue losartan 100 mg 1 tab by mouth daily, metoprolol 100 mg 1 tab by mouth twice a day and Lasix  Constipation - continue MiraLAX 17 g by mouth twice a day and senna S2 tabs by mouth twice a day  Protein calorie malnutrition, severe - albumin 2.7; start Procel 2 scoops by mouth twice a day    Goals of care:  Short-term rehabilitation    [facility], NP [employer] [phone]

## 2015-03-03 NOTE — Progress Notes (Signed)
Patient ID: Kristin Wilkerson, female   DOB: 1925/05/26, 81 y.o.   MRN: [id number]    DATE:  02/26/15  MRN:  [id number]  BIRTHDAY: [birth year]  Facility:  Nursing Home Location:  [facility]  Nursing Home Room Number: 1201-P  LEVEL OF CARE:  SNF [phone])  Contact Information    Name Relation Home Work Mobile   Pizzimenti,[name] Daughter [phone]     Kseniya, Grunden [phone]  [phone]   Rich Reining   [phone]       Chief Complaint  Patient presents with  . Discharge Note    Osteoarthritis S/P right total knee arthroplasty, hypertension, chronic diastolic heart failure, depression, history of stroke, allergic rhinitis, hyperlipidemia and vitamin D deficiency    HISTORY OF PRESENT ILLNESS:  This is an 81 year old female who is for discharge home with home health PT. DME: Rolling walker and 3 in 1 bedside commode. She has been admitted to [facility] on 02/14/15 from [facility]. She has PMH of hypertension, sciatica, lumbago, unspecified vitamin D deficiency, anxiety, hyperlipidemia, depression, glaucoma, reflux esophagitis, osteoporosis and benign neoplasm of colon. She has osteoarthritis of right knee for which she had right total knee arthroplasty.  Patient was admitted to this facility for short-term rehabilitation after the patient's recent hospitalization.  Patient has completed SNF rehabilitation and therapy has cleared the patient for discharge.  PAST MEDICAL HISTORY:  Past Medical History  Diagnosis Date  . Hypertension   . Abnormality of gait   . Disturbance of skin sensation   . Tachycardia, unspecified   . Internal hemorrhoids without mention of complication   . Synovial cyst of popliteal space   . Spinal stenosis, other region   . Sciatica   . Lumbago   . Unspecified vitamin D deficiency   . Anxiety   . Hyperlipidemia   . Abnormality of gait   . Unspecified sinusitis (chronic)   . Unspecified pruritic disorder   . Pain  in joint, lower leg   . Depressive disorder, not elsewhere classified   . Unspecified glaucoma   . Reflux esophagitis   . Diverticulosis of colon (without mention of hemorrhage)   . Pain in joint, site unspecified   . Myalgia and myositis, unspecified   . Osteoporosis, unspecified   . Other malaise and fatigue   . Cervicalgia   . Benign neoplasm of colon   . H/O echocardiogram 09/01/11    EF >55%, mild MR, RV systolic pressure elevated at 64-33 mmHg, mod TR  . Normal cardiac stress test 11/16/09    low risk scan, EF  77%  . Shortness of breath   . Stroke [facility]) "a couple of years ago"     visual problems left eye  . Arthritis   . Swelling of both ankles     rt leg  . Bruises easily      CURRENT MEDICATIONS: Reviewed  Patient's Medications  New Prescriptions   No medications on file  Previous Medications   ACETAMINOPHEN (TYLENOL) 500 MG TABLET    Take 1,000 mg by mouth daily as needed for moderate pain.   ATORVASTATIN (LIPITOR) 20 MG TABLET    TAKE 1 TABLET BY MOUTH EVERY EVENING TO CONTROL CHOLESTEROL   CALCIUM CARBONATE-VITAMIN D (CALCIUM 600+D) 600-400 MG-UNIT PER TABLET    Take one tablet by mouth daily for calcium supplement   CETIRIZINE (ZYRTEC) 10 MG TABLET    Take 1 tablet by mouth daily.   CHOLECALCIFEROL (VITAMIN D) 1000 UNITS  TABLET    Take 1,000 Units by mouth daily.    CLOPIDOGREL (PLAVIX) 75 MG TABLET    TAKE 1 TABLET BY MOUTH EVERY MORNING FOR ANTICOAGULATION   CLOTRIMAZOLE (LOTRIMIN) 1 % CREAM    Apply twice daily to rash until resolved   DULOXETINE (CYMBALTA) 30 MG CAPSULE    One daily to help nerves   FUROSEMIDE (LASIX) 20 MG TABLET    Take one every other day for diuretic       HYDROCODONE-ACETAMINOPHEN (NORCO/VICODIN) 5-325 MG TABLET    Take 1-2 tablets by mouth every 4 (four) hours as needed (breakthrough pain).   LATANOPROST (XALATAN) 0.005 % OPHTHALMIC SOLUTION    INSTILL 1 DROP TO BOTH EYES AT BEDTIME TO TREAT GLAUCOMA   LOSARTAN (COZAAR) 100 MG TABLET     TAKE 1 TABLET BY MOUTH ONCE DAILY FOR BLOOD PRESSURE   METOPROLOL (LOPRESSOR) 100 MG TABLET    TAKE 1 TABLET BY MOUTH TWICE DAILY TO CONTORL BLOOD PRESSURE   TRAMADOL (ULTRAM) 50 MG TABLET    Take 1-2 tablets (50-100 mg total) by mouth every 6 (six) hours as needed for moderate pain.  Modified Medications   No medications on file  Discontinued Medications   No medications on file     No Known Allergies   REVIEW OF SYSTEMS:  GENERAL: no change in appetite, no fatigue, no weight changes, no fever, chills or weakness EYES: Denies change in vision, dry eyes, eye pain, itching or discharge EARS: Denies change in hearing, ringing in ears, or earache NOSE: Denies nasal congestion or epistaxis MOUTH and THROAT: Denies oral discomfort, gingival pain or bleeding, pain from teeth or hoarseness   RESPIRATORY: no cough, SOB, DOE, wheezing, hemoptysis CARDIAC: no chest pain, or palpitations, +edema GI: no abdominal pain, diarrhea, heart burn, nausea or vomiting, +constipation GU: Denies dysuria, frequency, hematuria, incontinence, or discharge PSYCHIATRIC: Denies feeling of depression or anxiety. No report of hallucinations, insomnia, paranoia, or agitation   PHYSICAL EXAMINATION  GENERAL APPEARANCE: Well nourished. In no acute distress. Normal body habitus SKIN:  Right knee surgical incision is covered with Aquacel dressing, no erythema  HEAD: Normal in size and contour. No evidence of trauma EYES: Lids open and close normally. No blepharitis, entropion or ectropion. PERRL. Conjunctivae are clear and sclerae are white. Lenses are without opacity EARS: Pinnae are normal. Patient hears normal voice tunes of the examiner MOUTH and THROAT: Lips are without lesions. Oral mucosa is moist and without lesions. Tongue is normal in shape, size, and color and without lesions NECK: supple, trachea midline, no neck masses, no thyroid tenderness, no thyromegaly LYMPHATICS: no LAN in the neck, no  supraclavicular LAN RESPIRATORY: breathing is even & unlabored, BS CTAB CARDIAC: RRR, no murmur,no extra heart sounds, RLE edema 1+ GI: abdomen soft, normal BS, no masses, no tenderness, no hepatomegaly, no splenomegaly EXTREMITIES: Able to move 4 extremities PSYCHIATRIC: Alert and oriented X 3. Affect and behavior are appropriate  LABS/RADIOLOGY: Labs reviewed:  02/16/15  WBC 9.1 hemoglobin 10.6 hematocrit 33.4 MCV 93.8 platelet 295 Basic Metabolic Panel:  Recent Labs  16/01/9609/28/16 1118 02/17/15    NA 139 143    K 3.7 3.8    CL 99*  --     CO2 31  --     GLUCOSE 140*  --     BUN 13 32*    CREATININE 0.82 0.9    CALCIUM 8.9  --      Liver Function Tests:  Recent Labs  02/03/15 1525   AST 26   ALT 21   ALKPHOS 73   BILITOT 0.7   PROT 7.0   ALBUMIN 4.2    CBC:  Recent Labs  02/03/15 1525  02/14/15 0615 02/17/15    WBC 6.3  < > 11.3* 9.1    NEUTROABS 3.5  --   --   --     HGB 13.3  < > 11.2* 10.6*    HCT 41.0  < > 34.2* 33*    MCV 93.8  < > 92.2  --     PLT 197  < > 207 295    < > = values in this interval not displayed.  ASSESSMENT/PLAN:  Osteoarthritis S/P right total knee arthroplasty - for Home health PT; continue Ultram 50 mg 1-2 tabs by mouth every 6 hours when necessary and Norco 5/325 mg 1-2 tabs by mouth every 6 hours when necessary and Tylenol extra strength 500 mg 2 caplets by mouth daily when necessary; follow-up with Dr. Darrelyn Hillock, orthopedic surgeon, in 2 weeks; check CBC  Hypertension - continue Cozaar 100 mg 1 tab by mouth daily and Lopressor 100 mg by mouth twice a day  Chronic diastolic CHF - stable; continue Lasix 20 mg 1 tab by mouth every other day; check BMP  Depression - mood is stable; continue Cymbalta 30 mg 1 capsule by mouth daily  History of stroke - continue Plavix 75 mg 1 tab by mouth every morning  Allergic rhinitis - continue Zyrtec 10 mg 1 tab by mouth daily  Hyperlipidemia - continue Lipitor 20 mg 1 tab by mouth daily  evening  Vitamin D deficiency - continue vitamin D3 1000 units 1 tab by mouth daily  Constipation -  continue senna S2 tabs by mouth twice a day and MiraLAX 17 g by mouth twice a day  Right knee incisional infection - continue doxycycline 100 mg by mouth every 12 hours 5 more days      I have filled out patient's discharge paperwork and written prescriptions.  Patient will receive home health PT.  DME provided: Rolling walker and 3 in 1 bedside commode  Total discharge time: Greater than 30 minutes  Discharge time involved coordination of the discharge process with social worker, nursing staff and therapy department. Medical justification for home health services/DME verified.    [facility], NP [employer] [phone]

## 2015-03-04 ENCOUNTER — Non-Acute Institutional Stay (SKILLED_NURSING_FACILITY): Payer: Medicare Other | Admitting: Internal Medicine

## 2015-03-04 DIAGNOSIS — E46 Unspecified protein-calorie malnutrition: Secondary | ICD-10-CM | POA: Diagnosis not present

## 2015-03-04 DIAGNOSIS — T8131XD Disruption of external operation (surgical) wound, not elsewhere classified, subsequent encounter: Secondary | ICD-10-CM | POA: Diagnosis not present

## 2015-03-04 DIAGNOSIS — M1711 Unilateral primary osteoarthritis, right knee: Secondary | ICD-10-CM | POA: Diagnosis not present

## 2015-03-04 DIAGNOSIS — D62 Acute posthemorrhagic anemia: Secondary | ICD-10-CM | POA: Diagnosis not present

## 2015-03-04 DIAGNOSIS — K5901 Slow transit constipation: Secondary | ICD-10-CM

## 2015-03-04 DIAGNOSIS — I10 Essential (primary) hypertension: Secondary | ICD-10-CM | POA: Diagnosis not present

## 2015-03-04 DIAGNOSIS — R2681 Unsteadiness on feet: Secondary | ICD-10-CM | POA: Diagnosis not present

## 2015-03-04 DIAGNOSIS — F32A Depression, unspecified: Secondary | ICD-10-CM

## 2015-03-04 DIAGNOSIS — E785 Hyperlipidemia, unspecified: Secondary | ICD-10-CM

## 2015-03-04 DIAGNOSIS — F329 Major depressive disorder, single episode, unspecified: Secondary | ICD-10-CM | POA: Diagnosis not present

## 2015-03-04 NOTE — Progress Notes (Signed)
Patient ID: Kristin Wilkerson, female   DOB: 09-Jul-1925, 81 y.o.   MRN: [id number]     [facility]   PCP: GREEN, Lenon CurtARTHUR G, MD  Code Status: DNR  No Known Allergies  Chief Complaint  Patient presents with  . New Admit To SNF     HPI:  81 y.o. patient is here for short term rehabilitation post hospital admission with wound dehiscence of right knee arthroplasty incision. No surgical intervention was done. Her plavix was discontinued and aspirin was started. She has PMH of hypertension, spinal stenosis, sciatica, hyperlipidemia, depression, reflux esophagitis among others. She is seen in her room today. She denies any concerns. She is confused at times about where she is and what happened to her knee.    Review of Systems:  Constitutional: Negative for fever, chills.  HENT: Negative for headache Eyes: Negative for blurred vision, double vision and discharge.  Respiratory: Negative for cough, shortness of breath and wheezing.   Cardiovascular: Negative for chest pain, palpitations, leg swelling.  Gastrointestinal: Negative for heartburn, nausea, vomiting, abdominal pain Genitourinary: Negative for dysuria Skin: Negative for itching, rash.  Neurological: Negative for dizziness Psychiatric/Behavioral: Negative for depression   Past Medical History  Diagnosis Date  . Hypertension   . Abnormality of gait   . Disturbance of skin sensation   . Tachycardia, unspecified   . Internal hemorrhoids without mention of complication   . Synovial cyst of popliteal space   . Spinal stenosis, other region   . Sciatica   . Lumbago   . Unspecified vitamin D deficiency   . Anxiety   . Hyperlipidemia   . Abnormality of gait   . Unspecified sinusitis (chronic)   . Unspecified pruritic disorder   . Pain in joint, lower leg   . Depressive disorder, not elsewhere classified   . Unspecified glaucoma   . Reflux esophagitis   . Diverticulosis of colon (without  mention of hemorrhage)   . Pain in joint, site unspecified   . Myalgia and myositis, unspecified   . Osteoporosis, unspecified   . Other malaise and fatigue   . Cervicalgia   . Benign neoplasm of colon   . H/O echocardiogram 09/01/11    EF >55%, mild MR, RV systolic pressure elevated at 40-9830-40 mmHg, mod TR  . Normal cardiac stress test 11/16/09    low risk scan, EF  77%  . Shortness of breath   . Stroke [facility](HCC) "a couple of years ago"     visual problems left eye  . Arthritis   . Swelling of both ankles     rt leg  . Bruises easily    Past Surgical History  Procedure Laterality Date  . Lumbar laminectomy  12/04/2012    Complete decompressive L3-4 and L4-5--Dr. [name]  . Abdominal hysterectomy    . Cholecystectomy    . Cardiovascular stress test  11/16/2009    Perfusion defect seen in inferior myocardial region consistent with diaphragmatic attenuation. Remaining myocardium demonstrates normalmyocardial perfusion with no evidence of ischemia or infarct. No ECG changes. EKG negative for ischemia.  . Transthoracic echocardiogram  09/01/2011    EF >55% and moderate tricuspid valve regurg.  . Bilateral oophorectomy    . Total knee arthroplasty Right 02/11/2015    Procedure: TOTAL KNEE ARTHROPLASTY;  Surgeon: Ranee Gosselinonald Gioffre, MD;  Location: [facility];  Service: Orthopedics;  Laterality: Right;   Social History:   reports that she has never smoked. She does not have any  smokeless tobacco history on file. She reports that she does not drink alcohol or use illicit drugs.  Family History  Problem Relation Age of Onset  . Cancer Sister   . Stroke Mother   . Stroke Father     Medications:   Medication List       This list is accurate as of: 03/04/15  5:33 PM.  Always use your most recent med list.               acetaminophen 500 MG tablet  Commonly known as:  TYLENOL  Take 1,000 mg by mouth daily as needed for moderate pain.     aspirin 325 MG EC tablet  Take 1 tablet (325 mg total)  by mouth 2 (two) times daily.     atorvastatin 20 MG tablet  Commonly known as:  LIPITOR  TAKE 1 TABLET BY MOUTH EVERY EVENING TO CONTROL CHOLESTEROL     Calcium Carbonate-Vitamin D 600-400 MG-UNIT tablet  Commonly known as:  CALCIUM 600+D  Take one tablet by mouth daily for calcium supplement     cetirizine 10 MG tablet  Commonly known as:  ZYRTEC  Take 1 tablet by mouth daily.     cholecalciferol 1000 UNITS tablet  Commonly known as:  VITAMIN D  Take 1,000 Units by mouth daily.     doxycycline 100 MG tablet  Commonly known as:  VIBRA-TABS  Take 1 tablet (100 mg total) by mouth 2 (two) times daily.     DULoxetine 30 MG capsule  Commonly known as:  CYMBALTA  One daily to help nerves     furosemide 20 MG tablet  Commonly known as:  LASIX  Take one every other day for diuretic     latanoprost 0.005 % ophthalmic solution  Commonly known as:  XALATAN  PLACE 1 DROP IN BOTH EYES AT BEDTIME TO TREAT GLAUCOMA     losartan 100 MG tablet  Commonly known as:  COZAAR  TAKE 1 TABLET BY MOUTH ONCE DAILY FOR BLOOD PRESSURE     metoprolol 100 MG tablet  Commonly known as:  LOPRESSOR  TAKE 1 TABLET BY MOUTH TWICE DAILY TO CONTORL BLOOD PRESSURE     polyethylene glycol packet  Commonly known as:  MIRALAX / GLYCOLAX  Take 17 g by mouth 2 (two) times daily.     sennosides-docusate sodium 8.6-50 MG tablet  Commonly known as:  SENOKOT-S  Take 2 tablets by mouth 2 (two) times daily.     traMADol 50 MG tablet  Commonly known as:  ULTRAM  Take 1-2 tablets (50-100 mg total) by mouth every 6 (six) hours as needed for moderate pain.         Physical Exam: Filed Vitals:   03/04/15 1732  BP: 155/74  Pulse: 80  Temp: 97 F (36.1 C)  Resp: 18  Weight: 160 lb 12.8 oz (72.938 kg)  SpO2: 98%    General- elderly female, in no acute distress Head- normocephalic, atraumatic Nose- no maxillary or frontal sinus tenderness, no nasal discharge Throat- moist mucus membrane Eyes-  PERRLA, EOMI, no pallor, no icterus, no discharge, normal conjunctiva, normal sclera Neck- no cervical lymphadenopathy Cardiovascular- normal s1,s2, no murmurs, right leg edema +, good dorsalis pedis  Respiratory- bilateral clear to auscultation, no wheeze, no rhonchi, no crackles, no use of accessory muscles Abdomen- bowel sounds present, soft, non tender Musculoskeletal- able to move all 4 extremities, limited right knee range of motion  Neurological- no focal deficit, alert and oriented  Skin- warm and dry, right knee surgical incision has dehisced wound with dressing on top, right leg mild erythema Psychiatry- normal mood and affect    Labs reviewed: Basic Metabolic Panel:  Recent Labs  96/04/54 1118 02/17/15 03/01/15 1221 03/02/15 0450  NA 139 143 141 139  K 3.7 3.8 3.2* 3.1*  CL 99*  --  107 106  CO2 31  --  23 25  GLUCOSE 140*  --  111* 102*  BUN 13 32* 18 19  CREATININE 0.82 0.9 0.89 0.77  CALCIUM 8.9  --  9.1 8.6*   Liver Function Tests:  Recent Labs  02/03/15 1525 03/02/15 0450  AST 26 19  ALT 21 12*  ALKPHOS 73 83  BILITOT 0.7 1.0  PROT 7.0 5.7*  ALBUMIN 4.2 2.7*   No results for input(s): LIPASE, AMYLASE in the last 8760 hours. No results for input(s): AMMONIA in the last 8760 hours. CBC:  Recent Labs  02/03/15 1525  02/14/15 0615 02/17/15 03/01/15 1221 03/02/15 0450  WBC 6.3  < > 11.3* 9.1 10.1 7.0  NEUTROABS 3.5  --   --   --  7.9* 4.3  HGB 13.3  < > 11.2* 10.6* 9.9* 9.2*  HCT 41.0  < > 34.2* 33* 30.7* 29.1*  MCV 93.8  < > 92.2  --  92.7 91.2  PLT 197  < > 207 295 474* 414*  < > = values in this interval not displayed. Cardiac Enzymes: No results for input(s): CKTOTAL, CKMB, CKMBINDEX, TROPONINI in the last 8760 hours. BNP: Invalid input(s): POCBNP CBG: No results for input(s): GLUCAP in the last 8760 hours.  Radiological Exams: Dg Knee 2 Views Right  03/01/2015  CLINICAL DATA:  Recent fall with pain and swelling, right knee surgery  on 02/11/2015, open wound at suture site. EXAM: RIGHT KNEE - 1-2 VIEW COMPARISON:  Plain film examination of the right knee dated 12/23/2008. FINDINGS: Patient is status post total right knee arthroplasty. Hardware appears intact and well positioned. Expected joint effusion is present. No acute osseous abnormality seen. Superficial soft tissue swelling/edema may be related to the recent surgery or patient's recent fall. IMPRESSION: 1. Expected postsurgical findings status post recent right knee arthroplasty. 2. Soft tissue swelling/edema which may be related to the recent surgery or patient's recent fall. 3. No acute osseous abnormality. No osseous fracture or dislocation. Electronically Signed   By: Bary Richard M.D.   On: 03/01/2015 12:51     Assessment/Plan  Unsteady gait Will have her work with physical therapy and occupational therapy team to help with gait training and muscle strengthening exercises.fall precautions. Skin care. Encourage to be out of bed.   Wound dehiscence Has f/u with orthopedic on 03/05/15. Erythema + but currently on doxycycline until 03/12/15. Remains afebrile. monitor with check of cbc with diff and cmp. Continue wound dressing  Right knee Osteoarthritis  S/P right total knee arthroplasty. Continue therapy services. Continue aspirin 325 mg bid for dvt prophylaxis. WBAT. Continue tramadol 50 mg 1-2 tabs q6h prn pain  Blood loss anemia Monitor cbc  HTN Elevated bp, monitor bp reading, continue losartan 100 mg daily and metoprolol 100 mg bid with lasix every other day for now. Check bmp  Protein calorie malnutrition Monitor weight, continue procel for now  Constipation Continue miralax bid and senna s bid, maintain hydration  Hyperlipidemia continue atorvastatin 20 mg daily  Depression continue Cymbalta 30 mg daily   Goals of care: short term rehabilitation   Labs/tests ordered: cbc, cmp  Family/ staff Communication: reviewed care plan with patient  and nursing supervisor    Oneal Grout, MD  [facility] [phone] (Monday-Friday 8 am - 5 pm) [phone] (afterhours)

## 2015-03-05 DIAGNOSIS — T8131XA Disruption of external operation (surgical) wound, not elsewhere classified, initial encounter: Secondary | ICD-10-CM | POA: Diagnosis not present

## 2015-03-05 DIAGNOSIS — Z96651 Presence of right artificial knee joint: Secondary | ICD-10-CM | POA: Diagnosis not present

## 2015-03-05 DIAGNOSIS — Z471 Aftercare following joint replacement surgery: Secondary | ICD-10-CM | POA: Diagnosis not present

## 2015-03-06 ENCOUNTER — Encounter: Payer: Self-pay | Admitting: Cardiovascular Disease

## 2015-03-06 ENCOUNTER — Ambulatory Visit (INDEPENDENT_AMBULATORY_CARE_PROVIDER_SITE_OTHER): Payer: Medicare Other | Admitting: Cardiovascular Disease

## 2015-03-06 VITALS — BP 118/62 | HR 63 | Ht 62.0 in | Wt 170.0 lb

## 2015-03-06 DIAGNOSIS — I6529 Occlusion and stenosis of unspecified carotid artery: Secondary | ICD-10-CM

## 2015-03-06 DIAGNOSIS — I10 Essential (primary) hypertension: Secondary | ICD-10-CM

## 2015-03-06 DIAGNOSIS — E785 Hyperlipidemia, unspecified: Secondary | ICD-10-CM | POA: Diagnosis not present

## 2015-03-06 NOTE — Progress Notes (Signed)
03/06/2015 Kristin Wilkerson   1926-01-17  [id number]  Primary Physician GREEN, Kristin Curt, MD Primary Cardiologist: Kristin Gess MD Kristin Wilkerson   HPI:   81 year old female patient followed by me for hypertension and hyperlipidemia. I last saw her in the office 04/22/14. She had echo in 2013 and her last stress test was 2011 which was negative for ischemia.since I saw her 6 months ago she's remained clinically stable. [name] Her husband Kristin Wilkerson passed away on August 13, 2013 and she is appropriately grieving.. She still has atypical chest pain which I do not think is cardiac. She really has no specific complaints. She did have arthroplasty performed by Dr. Darrelyn Wilkerson is currently wearing a soft cast. .    Current Outpatient Prescriptions  Medication Sig Dispense Refill  . acetaminophen (TYLENOL) 500 MG tablet Take 1,000 mg by mouth daily as needed for moderate pain.    [name] aspirin EC 325 MG EC tablet Take 1 tablet (325 mg total) by mouth 2 (two) times daily. 30 tablet 0  . atorvastatin (LIPITOR) 20 MG tablet TAKE 1 TABLET BY MOUTH EVERY EVENING TO CONTROL CHOLESTEROL 90 tablet 1  . Calcium Carbonate-Vitamin D (CALCIUM 600+D) 600-400 MG-UNIT per tablet Take one tablet by mouth daily for calcium supplement (Patient taking differently: Take 1 tablet by mouth daily. ) 30 tablet 5  . cetirizine (ZYRTEC) 10 MG tablet Take 1 tablet by mouth daily.  11  . cholecalciferol (VITAMIN D) 1000 UNITS tablet Take 1,000 Units by mouth daily.     [name] doxycycline (VIBRA-TABS) 100 MG tablet Take 1 tablet (100 mg total) by mouth 2 (two) times daily. 20 tablet 0  . DULoxetine (CYMBALTA) 30 MG capsule One daily to help nerves (Patient taking differently: Take 30 mg by mouth daily. One daily to help nerves) 30 capsule 3  . furosemide (LASIX) 20 MG tablet Take one every other day for diuretic (Patient taking differently: Take 20 mg by mouth daily. ) 30 tablet 5  . latanoprost (XALATAN) 0.005 % ophthalmic solution PLACE 1  DROP IN BOTH EYES AT BEDTIME TO TREAT GLAUCOMA 7.5 mL 2  . losartan (COZAAR) 100 MG tablet TAKE 1 TABLET BY MOUTH ONCE DAILY FOR BLOOD PRESSURE 90 tablet 1  . metoprolol (LOPRESSOR) 100 MG tablet TAKE 1 TABLET BY MOUTH TWICE DAILY TO CONTORL BLOOD PRESSURE 180 tablet 1  . polyethylene glycol (MIRALAX / GLYCOLAX) packet Take 17 g by mouth 2 (two) times daily.    . sennosides-docusate sodium (SENOKOT-S) 8.6-50 MG tablet Take 2 tablets by mouth 2 (two) times daily.    . traMADol (ULTRAM) 50 MG tablet Take 1-2 tablets (50-100 mg total) by mouth every 6 (six) hours as needed for moderate pain. 60 tablet 0   No current facility-administered medications for this visit.    No Known Allergies  Social History   Social History  . Marital Status: Widowed    Spouse Name: N/A  . Number of Children: 3  . Years of Education: 12th   Occupational History  . retired    Social History Main Topics  . Smoking status: Never Smoker   . Smokeless tobacco: Not on file  . Alcohol Use: No  . Drug Use: No  . Sexual Activity: No   Other Topics Concern  . Not on file   Social History Narrative     Review of Systems: General: negative for chills, fever, night sweats or weight changes.  Cardiovascular: negative for chest pain, dyspnea on exertion, edema,  orthopnea, palpitations, paroxysmal nocturnal dyspnea or shortness of breath Dermatological: negative for rash Respiratory: negative for cough or wheezing Urologic: negative for hematuria Abdominal: negative for nausea, vomiting, diarrhea, bright red blood per rectum, melena, or hematemesis Neurologic: negative for visual changes, syncope, or dizziness All other systems reviewed and are otherwise negative except as noted above.    Blood pressure 118/62, pulse 63, height 5\' 2"  (1.575 m), weight 170 lb (77.111 kg).  General appearance: alert and no distress Neck: no adenopathy, no carotid bruit, no JVD, supple, symmetrical, trachea midline and thyroid  not enlarged, symmetric, no tenderness/mass/nodules Lungs: clear to auscultation bilaterally Heart: regular rate and rhythm, S1, S2 normal, no murmur, click, rub or gallop Extremities: extremities normal, atraumatic, no cyanosis or edema  EKG not performed today  ASSESSMENT AND PLAN:   HTN (hypertension) History of hypertension blood pressure measured at 118/62. She is on losartan and metoprolol. Continue current meds at current dosing  HLD (hyperlipidemia) History of hyperlipidemia on atorvastatin followed by her PCP      Kristin GessJonathan J. Joaopedro Eschbach MD [facility] LPFACP,FACC,FAHA, [facility] 03/06/2015 11:42 AM

## 2015-03-06 NOTE — Assessment & Plan Note (Signed)
History of hyperlipidemia on atorvastatin followed by her PCP 

## 2015-03-06 NOTE — Patient Instructions (Signed)

## 2015-03-06 NOTE — Assessment & Plan Note (Signed)
History of hypertension blood pressure measured at 118/62. She is on losartan and metoprolol. Continue current meds at current dosing

## 2015-03-09 ENCOUNTER — Other Ambulatory Visit: Payer: Medicare Other

## 2015-03-09 DIAGNOSIS — Z96651 Presence of right artificial knee joint: Secondary | ICD-10-CM | POA: Diagnosis not present

## 2015-03-09 DIAGNOSIS — Z471 Aftercare following joint replacement surgery: Secondary | ICD-10-CM | POA: Diagnosis not present

## 2015-03-11 ENCOUNTER — Ambulatory Visit: Payer: Medicare Other | Admitting: Internal Medicine

## 2015-03-11 DIAGNOSIS — T8131XD Disruption of external operation (surgical) wound, not elsewhere classified, subsequent encounter: Secondary | ICD-10-CM | POA: Diagnosis not present

## 2015-03-14 ENCOUNTER — Emergency Department (HOSPITAL_COMMUNITY)
Admission: EM | Admit: 2015-03-14 | Discharge: 2015-03-15 | Disposition: A | Discharge status: Home / Self Care | Payer: Medicare Other | Attending: Emergency Medicine | Admitting: Emergency Medicine

## 2015-03-14 ENCOUNTER — Encounter (HOSPITAL_COMMUNITY): Payer: Self-pay | Admitting: Emergency Medicine

## 2015-03-14 DIAGNOSIS — Z86018 Personal history of other benign neoplasm: Secondary | ICD-10-CM | POA: Insufficient documentation

## 2015-03-14 DIAGNOSIS — E559 Vitamin D deficiency, unspecified: Secondary | ICD-10-CM | POA: Insufficient documentation

## 2015-03-14 DIAGNOSIS — I10 Essential (primary) hypertension: Secondary | ICD-10-CM | POA: Diagnosis not present

## 2015-03-14 DIAGNOSIS — T8131XA Disruption of external operation (surgical) wound, not elsewhere classified, initial encounter: Secondary | ICD-10-CM | POA: Diagnosis not present

## 2015-03-14 DIAGNOSIS — Z7982 Long term (current) use of aspirin: Secondary | ICD-10-CM | POA: Insufficient documentation

## 2015-03-14 DIAGNOSIS — M1990 Unspecified osteoarthritis, unspecified site: Secondary | ICD-10-CM | POA: Insufficient documentation

## 2015-03-14 DIAGNOSIS — W19XXXA Unspecified fall, initial encounter: Secondary | ICD-10-CM

## 2015-03-14 DIAGNOSIS — E785 Hyperlipidemia, unspecified: Secondary | ICD-10-CM | POA: Diagnosis not present

## 2015-03-14 DIAGNOSIS — M810 Age-related osteoporosis without current pathological fracture: Secondary | ICD-10-CM | POA: Insufficient documentation

## 2015-03-14 DIAGNOSIS — Z872 Personal history of diseases of the skin and subcutaneous tissue: Secondary | ICD-10-CM | POA: Insufficient documentation

## 2015-03-14 DIAGNOSIS — F419 Anxiety disorder, unspecified: Secondary | ICD-10-CM | POA: Diagnosis not present

## 2015-03-14 DIAGNOSIS — S0990XA Unspecified injury of head, initial encounter: Secondary | ICD-10-CM | POA: Diagnosis not present

## 2015-03-14 DIAGNOSIS — Z8709 Personal history of other diseases of the respiratory system: Secondary | ICD-10-CM | POA: Insufficient documentation

## 2015-03-14 DIAGNOSIS — Z79899 Other long term (current) drug therapy: Secondary | ICD-10-CM | POA: Insufficient documentation

## 2015-03-14 DIAGNOSIS — Z8673 Personal history of transient ischemic attack (TIA), and cerebral infarction without residual deficits: Secondary | ICD-10-CM | POA: Insufficient documentation

## 2015-03-14 DIAGNOSIS — F0390 Unspecified dementia without behavioral disturbance: Secondary | ICD-10-CM | POA: Insufficient documentation

## 2015-03-14 DIAGNOSIS — F329 Major depressive disorder, single episode, unspecified: Secondary | ICD-10-CM | POA: Diagnosis not present

## 2015-03-14 DIAGNOSIS — Z792 Long term (current) use of antibiotics: Secondary | ICD-10-CM | POA: Insufficient documentation

## 2015-03-14 DIAGNOSIS — M7989 Other specified soft tissue disorders: Secondary | ICD-10-CM | POA: Diagnosis not present

## 2015-03-14 DIAGNOSIS — H409 Unspecified glaucoma: Secondary | ICD-10-CM | POA: Insufficient documentation

## 2015-03-14 DIAGNOSIS — Y658 Other specified misadventures during surgical and medical care: Secondary | ICD-10-CM | POA: Insufficient documentation

## 2015-03-14 DIAGNOSIS — Z8719 Personal history of other diseases of the digestive system: Secondary | ICD-10-CM | POA: Insufficient documentation

## 2015-03-14 DIAGNOSIS — T8130XA Disruption of wound, unspecified, initial encounter: Secondary | ICD-10-CM | POA: Diagnosis not present

## 2015-03-14 NOTE — ED Provider Notes (Signed)
CSN: [id number]     Arrival date & time 03/14/15  2331 History  By signing my name below, I, Phillis Haggis, attest that this documentation has been prepared under the direction and in the presence of Derwood Kaplan, MD. Electronically Signed: Phillis Haggis, ED Scribe. 03/14/2015. 2:19 AM.  Chief Complaint  Patient presents with  . Knee Pain   The history is provided by the patient. No language interpreter was used.  HPI Comments (Level 5 Caveat due to dementia): Kristin Wilkerson is a 81 y.o. Female with a hx of dementia, stroke, bruising easily, abnormality of gait, joint pain, and osteoporosis brought in by EMS who presents to the Emergency Department complaining of intermittent right knee pain and bleeding. Pt denies pain anywhere on the body. Per triage note, pt is brought in from [facility], a rehab facility, following knee replacement surgery. Tonight the facility reports that the pt experienced a fall, although EMS states that there was no evidence of a fall and pt was found with her leg propped up on a pillow. They believe that someone at the facility may have "messed" with her knee too much and caused the stitches to pop, which led to the bleeding of the incision. Pt reports that she is able to ambulate.   Past Medical History  Diagnosis Date  . Hypertension   . Abnormality of gait   . Disturbance of skin sensation   . Tachycardia, unspecified   . Internal hemorrhoids without mention of complication   . Synovial cyst of popliteal space   . Spinal stenosis, other region   . Sciatica   . Lumbago   . Unspecified vitamin D deficiency   . Anxiety   . Hyperlipidemia   . Abnormality of gait   . Unspecified sinusitis (chronic)   . Unspecified pruritic disorder   . Pain in joint, lower leg   . Depressive disorder, not elsewhere classified   . Unspecified glaucoma   . Reflux esophagitis   . Diverticulosis of colon (without mention of hemorrhage)   . Pain in joint, site unspecified    . Myalgia and myositis, unspecified   . Osteoporosis, unspecified   . Other malaise and fatigue   . Cervicalgia   . Benign neoplasm of colon   . H/O echocardiogram 09/01/11    EF >55%, mild MR, RV systolic pressure elevated at 40-98 mmHg, mod TR  . Normal cardiac stress test 11/16/09    low risk scan, EF  77%  . Shortness of breath   . Stroke [facility]) "a couple of years ago"     visual problems left eye  . Arthritis   . Swelling of both ankles     rt leg  . Bruises easily    Past Surgical History  Procedure Laterality Date  . Lumbar laminectomy  12/04/2012    Complete decompressive L3-4 and L4-5--Dr. Darrelyn Hillock  . Abdominal hysterectomy    . Cholecystectomy    . Cardiovascular stress test  11/16/2009    Perfusion defect seen in inferior myocardial region consistent with diaphragmatic attenuation. Remaining myocardium demonstrates normalmyocardial perfusion with no evidence of ischemia or infarct. No ECG changes. EKG negative for ischemia.  . Transthoracic echocardiogram  09/01/2011    EF >55% and moderate tricuspid valve regurg.  . Bilateral oophorectomy    . Total knee arthroplasty Right 02/11/2015    Procedure: TOTAL KNEE ARTHROPLASTY;  Surgeon: Ranee Gosselin, MD;  Location: [facility] ORS;  Service: Orthopedics;  Laterality: Right;   Family History  Problem Relation Age of Onset  . Cancer Sister   . Stroke Mother   . Stroke Father    Social History  Substance Use Topics  . Smoking status: Never Smoker   . Smokeless tobacco: None  . Alcohol Use: No   OB History    No data available     Review of Systems  Unable to perform ROS: Dementia   Allergies  Review of patient's allergies indicates no known allergies.  Home Medications   Prior to Admission medications   Medication Sig Start Date End Date Taking? Authorizing Provider  acetaminophen (TYLENOL) 500 MG tablet Take 1,000 mg by mouth daily as needed for moderate pain.    Historical Provider, MD  aspirin EC 325 MG EC tablet  Take 1 tablet (325 mg total) by mouth 2 (two) times daily. 03/02/15   Amber Celedonio Savage, PA-C  atorvastatin (LIPITOR) 20 MG tablet TAKE 1 TABLET BY MOUTH EVERY EVENING TO CONTROL CHOLESTEROL 06/23/14   Kimber Relic, MD  Calcium Carbonate-Vitamin D (CALCIUM 600+D) 600-400 MG-UNIT per tablet Take one tablet by mouth daily for calcium supplement Patient taking differently: Take 1 tablet by mouth daily.  07/02/13   Kimber Relic, MD  cetirizine (ZYRTEC) 10 MG tablet Take 1 tablet by mouth daily. 11/26/14   Historical Provider, MD  cholecalciferol (VITAMIN D) 1000 UNITS tablet Take 1,000 Units by mouth daily.     Historical Provider, MD  doxycycline (VIBRA-TABS) 100 MG tablet Take 1 tablet (100 mg total) by mouth 2 (two) times daily. 03/02/15   Amber Celedonio Savage, PA-C  DULoxetine (CYMBALTA) 30 MG capsule One daily to help nerves Patient taking differently: Take 30 mg by mouth daily. One daily to help nerves 01/07/14   Kimber Relic, MD  furosemide (LASIX) 20 MG tablet Take one every other day for diuretic Patient taking differently: Take 20 mg by mouth daily.  09/24/14   Kimber Relic, MD  latanoprost (XALATAN) 0.005 % ophthalmic solution PLACE 1 DROP IN BOTH EYES AT BEDTIME TO TREAT GLAUCOMA 02/23/15   Kimber Relic, MD  losartan (COZAAR) 100 MG tablet TAKE 1 TABLET BY MOUTH ONCE DAILY FOR BLOOD PRESSURE 11/17/14   Kimber Relic, MD  metoprolol (LOPRESSOR) 100 MG tablet TAKE 1 TABLET BY MOUTH TWICE DAILY TO CONTORL BLOOD PRESSURE 06/23/14   Kimber Relic, MD  polyethylene glycol (MIRALAX / Ethelene Hal) packet Take 17 g by mouth 2 (two) times daily.    Historical Provider, MD  sennosides-docusate sodium (SENOKOT-S) 8.6-50 MG tablet Take 2 tablets by mouth 2 (two) times daily.    Historical Provider, MD  traMADol (ULTRAM) 50 MG tablet Take 1-2 tablets (50-100 mg total) by mouth every 6 (six) hours as needed for moderate pain. 02/13/15   Amber Constable, PA-C   BP 150/73 mmHg  Pulse 74  Temp(Src) 98.2 F (36.8 C)  (Oral)  Resp 14  SpO2 98% Physical Exam  Constitutional: She appears well-developed and well-nourished. No distress.  HENT:  Head: Normocephalic and atraumatic.  Mouth/Throat: Oropharynx is clear and moist. No oropharyngeal exudate.  No bleeding or hematoma to the head  Eyes: Conjunctivae and EOM are normal. Pupils are equal, round, and reactive to light. Right eye exhibits no discharge. Left eye exhibits no discharge. No scleral icterus.  Pupils 3mm bilaterally and reactive  Neck: Normal range of motion. Neck supple. No JVD present. Erythema present. No thyromegaly present.  Cardiovascular: Normal rate, regular rhythm, normal heart sounds and intact distal pulses.  Exam  reveals no gallop and no friction rub.   No murmur heard. Pulmonary/Chest: Effort normal and breath sounds normal. No respiratory distress. She has no wheezes. She has no rales.  Abdominal: Soft. Bowel sounds are normal. She exhibits no distension and no mass. There is no tenderness.  Musculoskeletal: Normal range of motion. She exhibits no edema or tenderness.  No midline C-spine tenderness; pelvis stable; no nuchal rigidity; no upper extremity gross deformity or tenderness to palpation; Right knee evaluation shows wound dehiscence (see picture below)  Lymphadenopathy:    She has no cervical adenopathy.  Neurological: She is alert. Coordination normal.  Skin: Skin is warm and dry. No rash noted. No erythema.  Psychiatric: She has a normal mood and affect. Her behavior is normal.  Nursing note and vitals reviewed.   ED Course  Procedures (including critical care time) DIAGNOSTIC STUDIES: Oxygen Saturation is 98% on RA, normal by my interpretation.    COORDINATION OF CARE: 12:40 AM-imaging  Labs Review Labs Reviewed - No data to display  Imaging Review Ct Head Wo Contrast  03/15/2015  CLINICAL DATA:  Question of fall. Concern for head injury. Initial encounter. EXAM: CT HEAD WITHOUT CONTRAST TECHNIQUE:  Contiguous axial images were obtained from the base of the skull through the vertex without intravenous contrast. COMPARISON:  CT of the head and MRI of the brain performed 02/28/2013 FINDINGS: There is no evidence of acute infarction, mass lesion, or intra- or extra-axial hemorrhage on CT. Prominence of the ventricles and sulci reflects moderate cortical volume loss. Cerebellar atrophy is noted. Chronic encephalomalacia is noted at the right occipital lobe, reflecting remote infarct as previously noted. The brainstem and fourth ventricle are within normal limits. The basal ganglia are unremarkable in appearance. No mass effect or midline shift is seen. There is no evidence of fracture; visualized osseous structures are unremarkable in appearance. The orbits are within normal limits. A small mucus retention cyst or polyp is noted at the right maxillary sinus. There is mild partial opacification of the left side of the sphenoid sinus. The remaining paranasal sinuses and mastoid air cells are well-aerated. A 1.1 cm soft tissue nodule overlying the right parietal calvarium demonstrates minimal calcification and is likely benign. IMPRESSION: 1. No evidence of traumatic intracranial injury or fracture. 2. Moderate cortical volume loss noted. 3. Chronic encephalomalacia at the right occipital lobe, reflecting remote infarct as previously noted. 4. 1.1 cm soft tissue nodule overlying the right frontal calvarium demonstrates minimal calcification is likely benign, given its appearance. 5. Small mucus retention cyst or polyp noted at the right maxillary sinus. Mild partial opacification of the left side of the sphenoid sinus. Electronically Signed   By: Roanna Raider M.D.   On: 03/15/2015 01:53   Dg Knee Complete 4 Views Right  03/15/2015  CLINICAL DATA:  Redness and swelling in the right knee. Intermittent right knee pain and bleeding. Recent right knee replacement. No injury. EXAM: RIGHT KNEE - COMPLETE 4+ VIEW  COMPARISON:  03/01/2015 FINDINGS: Right total knee arthroplasty with patellar femoral component. Components appear well seated. Soft tissue swelling and mild soft tissue gas anteriorly is probably postoperative. Soft tissue gas is decreased since the previous study. No evidence of acute fracture or dislocation. No focal bone lesion or bone destruction. IMPRESSION: Postoperative right total knee arthroplasty. Components appear well seated. Soft tissue swelling and subcutaneous gas anterior to the knee is likely postoperative and is decreasing since previous study. Electronically Signed   By: Marisa Cyphers.D.  On: 03/15/2015 01:45   I have personally reviewed and evaluated these images and lab results as part of my medical decision-making.   EKG Interpretation None            MDM   Final diagnoses:  Fall, initial encounter  Wound dehiscence, initial encounter    I personally performed the services described in this documentation, which was scribed in my presence. The recorded information has been reviewed and is accurate.   Pt comes in with cc of fall. CT head is neg. Head to toe exam reveals no gross deformity. Pt is not a good historian - i called the nursing home and they reported that pt was found on the floor - and when they checked the knee there was dehiscence to the R knee. They also report that pt is seeing wound care for the post op care, and that pt has an appt with Ortho on Monday. Pt used to be on antibiotics for cellulitis.  Currently - there is no warmth to touch. There is swelling and redness, not sure if it is acute due to the fall - but doesn't appear to be impressive for an infection. Will get xray of the knee.  I spoke with the Orthopedist on call, they will give me recs on wound care. Given the complex wound healing - will let them decide on wound care.    Derwood KaplanAnkit Makenzee Choudhry, MD 03/15/15 [phone]

## 2015-03-14 NOTE — ED Notes (Signed)
Bed: [id number] Expected date:  Expected time:  Means of arrival:  Comments: EMS 81yo F rt knee replacement wound

## 2015-03-14 NOTE — ED Notes (Signed)
[facility] c/o intermittent right knee pain and bleeding.  She had a knee replacement surgery recently and was admitted to the facility for rehab.  The facility reported a fall to EMS but EMS states that there was no evidence of a fall; the facility has a policy that prohibits them lifting fallen patients from the floor, but when EMS arrived the pt was neatly in bed with her leg propped on pillows and no blood visible in the area.  The knee incision has been dressed but has clearly been bleeding.  EMS states that a facility staff member stated that they "might have messed with the knee too much" and caused the stitches to pull loose. Pt vehemently denies a fall and states that she was "just minding her own business." Hx of dementia but pt is alert and oriented x 3 and responding appropriately to questions.  She denies additional injury and states that the knee only hurts with movement.  No acute distress.

## 2015-03-15 ENCOUNTER — Encounter (HOSPITAL_COMMUNITY): Payer: Self-pay | Admitting: Radiology

## 2015-03-15 ENCOUNTER — Emergency Department (HOSPITAL_COMMUNITY): Payer: Medicare Other

## 2015-03-15 MED ORDER — CEFAZOLIN SODIUM 1-5 GM-% IV SOLN
1.0000 g | Freq: Once | INTRAVENOUS | Status: AC
  Administered 2015-03-15: 1 g via INTRAVENOUS
  Filled 2015-03-15: qty 50

## 2015-03-15 MED ORDER — LIDOCAINE HCL 1 % IJ SOLN
30.0000 mL | Freq: Once | INTRAMUSCULAR | Status: AC
  Administered 2015-03-15: 30 mL
  Filled 2015-03-15: qty 40

## 2015-03-15 NOTE — ED Notes (Signed)
Pt is resting with equal chest rise and fall noted.  No acute distress, vitals consistent with baseline values. Waiting on orthopedist to come and replace sutures in surgical incision, which has been dressed with wet-to-dry dressing per MD Nanavati's orders.

## 2015-03-15 NOTE — Discharge Instructions (Signed)
TID dry dressing changes at the SNF with adaptic   The wound appears unchanged, continue having wound care and orthopedic follow up.   Wound Dehiscence Wound dehiscence is when a surgical cut (incision) breaks open and does not heal properly after surgery. It usually happens 7-10 days after surgery. This can be a serious condition. It is important to identify and treat this condition early.  CAUSES  Some common causes of wound dehiscence include:  Stretching of the wound area. This may be caused by lifting, vomiting, violent coughing, or straining during bowel movements.  Wound infection.  Early stitch (suture) removal. RISK FACTORS Various things can increase your risk of developing wound dehiscence, including:  Obesity.  Lung disease.  Smoking.  Poor nutrition.  Contamination during surgery. SIGNS AND SYMPTOMS  Bleeding from the wound.  Pain.  Fever.  Wound starts breaking open. DIAGNOSIS  Your health care provider may diagnose wound dehiscence by monitoring the incision and noting any changes in the wound. These changes can include an increase in drainage or pain. The health care provider may also ask you if you have noticed any stretching or tearing of the wound.  Wound cultures may be taken to determine if there is an infection.  Imaging studies, such as an MRI scan or CT scan, may be done to determine if there is a collection of pus or fluid in the wound area. TREATMENT Treatment may include:  Wound care.  Surgical repair.  Antibiotic medicine to treat or prevent infection.  Medicines to reduce pain and swelling. HOME CARE INSTRUCTIONS   Only take over-the-counter or prescription medicines for pain, discomfort, or fever as directed by your health care provider. Taking pain medicine 30 minutes before changing a bandage (dressing) can help relieve pain.  Take your antibiotics as directed. Finish them even if you start to feel better.  Gently wash the  area with mild soap and water 2 times a day, or as directed. Rinse off the soap. Pat the area dry with a clean towel. Do not rub the wound. This may cause bleeding.  Follow your health care provider's instructions for how often you need to change the dressing and packing inside. Wash your hands well before and after changing your dressing. Apply a dressing to the wound as directed.  Take showers. Do not soak the wound, bathe, swim, or use a hot tub until directed by your health care provider.  Avoid exercises that make you sweat heavily.  Use anti-itch medicine as directed by your health care provider. The wound may itch when it is healing. Do not pick or scratch at the wound.  Do not lift more than 10 pounds (4.5 kg) until the wound is healed, or as directed by your health care provider.  Keep all follow-up appointments as directed. SEEK MEDICAL CARE IF:  You have excessive bleeding from your surgical wound.  Your wound does not seem to be healing properly.  You have a fever. SEEK IMMEDIATE MEDICAL CARE IF:   You have increased swelling or redness around the wound.  You have increasing pain in the wound.  You have an increasing amount of pus coming from the wound.  Your wound breaks open farther. MAKE SURE YOU:   Understand these instructions.  Will watch your condition.  Will get help right away if you are not doing well or get worse.   This information is not intended to replace advice given to you by your health care provider. Make sure you  discuss any questions you have with your health care provider.   Document Released: 06/25/2003 Document Revised: 04/09/2013 Document Reviewed: 12/10/2012 Elsevier Interactive Patient Education [employer]
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
## 2015-03-15 NOTE — ED Notes (Signed)
Pt is sleeping with equal chest rise and fall noted. No acute distress.  

## 2015-03-17 DIAGNOSIS — T8131XD Disruption of external operation (surgical) wound, not elsewhere classified, subsequent encounter: Secondary | ICD-10-CM | POA: Diagnosis not present

## 2015-03-17 DIAGNOSIS — Z471 Aftercare following joint replacement surgery: Secondary | ICD-10-CM | POA: Diagnosis not present

## 2015-03-17 DIAGNOSIS — Z96651 Presence of right artificial knee joint: Secondary | ICD-10-CM | POA: Diagnosis not present

## 2015-03-24 ENCOUNTER — Telehealth: Payer: Self-pay | Admitting: *Deleted

## 2015-03-24 DIAGNOSIS — Z96651 Presence of right artificial knee joint: Secondary | ICD-10-CM | POA: Diagnosis not present

## 2015-03-24 DIAGNOSIS — Z471 Aftercare following joint replacement surgery: Secondary | ICD-10-CM | POA: Diagnosis not present

## 2015-03-24 DIAGNOSIS — T8131XD Disruption of external operation (surgical) wound, not elsewhere classified, subsequent encounter: Secondary | ICD-10-CM | POA: Diagnosis not present

## 2015-03-24 NOTE — Telephone Encounter (Signed)
Patient son, Greig Castillandrew called and requested to speak with Dr. Chilton SiGreen concerning patient. She has a knee surgery back in late October and she has been having trouble recovering. Son wants your opinion regarding some things. States he values your opinion and would like to speak with you. Please call #: [phone]

## 2015-03-27 ENCOUNTER — Other Ambulatory Visit: Payer: Self-pay | Admitting: Internal Medicine

## 2015-03-27 DIAGNOSIS — Z7409 Other reduced mobility: Secondary | ICD-10-CM

## 2015-03-27 DIAGNOSIS — R410 Disorientation, unspecified: Secondary | ICD-10-CM

## 2015-03-27 NOTE — Telephone Encounter (Signed)
Discussed the condition of Kristin Wilkerson with her son. He says they're pushing to get out of the nursing home, but she really can't walk or take care of herself. Family may need assistance in making a decision in regards to assisted living or home care. Ad[facility] to [facility].

## 2015-03-29 ENCOUNTER — Encounter (HOSPITAL_COMMUNITY): Payer: Self-pay | Admitting: Emergency Medicine

## 2015-03-29 ENCOUNTER — Emergency Department (HOSPITAL_COMMUNITY): Payer: Medicare Other

## 2015-03-29 ENCOUNTER — Emergency Department (HOSPITAL_COMMUNITY)
Admission: EM | Admit: 2015-03-29 | Discharge: 2015-03-29 | Disposition: A | Discharge status: Home / Self Care | Payer: Medicare Other | Attending: Emergency Medicine | Admitting: Emergency Medicine

## 2015-03-29 DIAGNOSIS — Z79899 Other long term (current) drug therapy: Secondary | ICD-10-CM | POA: Insufficient documentation

## 2015-03-29 DIAGNOSIS — Z8719 Personal history of other diseases of the digestive system: Secondary | ICD-10-CM | POA: Insufficient documentation

## 2015-03-29 DIAGNOSIS — Y92129 Unspecified place in nursing home as the place of occurrence of the external cause: Secondary | ICD-10-CM | POA: Insufficient documentation

## 2015-03-29 DIAGNOSIS — T8130XA Disruption of wound, unspecified, initial encounter: Secondary | ICD-10-CM | POA: Diagnosis not present

## 2015-03-29 DIAGNOSIS — F0390 Unspecified dementia without behavioral disturbance: Secondary | ICD-10-CM | POA: Insufficient documentation

## 2015-03-29 DIAGNOSIS — Z792 Long term (current) use of antibiotics: Secondary | ICD-10-CM | POA: Insufficient documentation

## 2015-03-29 DIAGNOSIS — Y9389 Activity, other specified: Secondary | ICD-10-CM | POA: Insufficient documentation

## 2015-03-29 DIAGNOSIS — I10 Essential (primary) hypertension: Secondary | ICD-10-CM | POA: Insufficient documentation

## 2015-03-29 DIAGNOSIS — F329 Major depressive disorder, single episode, unspecified: Secondary | ICD-10-CM | POA: Insufficient documentation

## 2015-03-29 DIAGNOSIS — S79911A Unspecified injury of right hip, initial encounter: Secondary | ICD-10-CM | POA: Diagnosis not present

## 2015-03-29 DIAGNOSIS — S8991XA Unspecified injury of right lower leg, initial encounter: Secondary | ICD-10-CM | POA: Insufficient documentation

## 2015-03-29 DIAGNOSIS — E785 Hyperlipidemia, unspecified: Secondary | ICD-10-CM | POA: Diagnosis not present

## 2015-03-29 DIAGNOSIS — F419 Anxiety disorder, unspecified: Secondary | ICD-10-CM | POA: Insufficient documentation

## 2015-03-29 DIAGNOSIS — Z9889 Other specified postprocedural states: Secondary | ICD-10-CM | POA: Insufficient documentation

## 2015-03-29 DIAGNOSIS — T8131XD Disruption of external operation (surgical) wound, not elsewhere classified, subsequent encounter: Secondary | ICD-10-CM | POA: Diagnosis not present

## 2015-03-29 DIAGNOSIS — R269 Unspecified abnormalities of gait and mobility: Secondary | ICD-10-CM | POA: Diagnosis not present

## 2015-03-29 DIAGNOSIS — Z8673 Personal history of transient ischemic attack (TIA), and cerebral infarction without residual deficits: Secondary | ICD-10-CM | POA: Insufficient documentation

## 2015-03-29 DIAGNOSIS — Z86018 Personal history of other benign neoplasm: Secondary | ICD-10-CM | POA: Insufficient documentation

## 2015-03-29 DIAGNOSIS — W1839XA Other fall on same level, initial encounter: Secondary | ICD-10-CM | POA: Insufficient documentation

## 2015-03-29 DIAGNOSIS — H409 Unspecified glaucoma: Secondary | ICD-10-CM | POA: Insufficient documentation

## 2015-03-29 DIAGNOSIS — M25551 Pain in right hip: Secondary | ICD-10-CM | POA: Diagnosis not present

## 2015-03-29 DIAGNOSIS — Z872 Personal history of diseases of the skin and subcutaneous tissue: Secondary | ICD-10-CM | POA: Insufficient documentation

## 2015-03-29 DIAGNOSIS — M25561 Pain in right knee: Secondary | ICD-10-CM | POA: Diagnosis not present

## 2015-03-29 DIAGNOSIS — Y998 Other external cause status: Secondary | ICD-10-CM | POA: Insufficient documentation

## 2015-03-29 DIAGNOSIS — W19XXXA Unspecified fall, initial encounter: Secondary | ICD-10-CM

## 2015-03-29 DIAGNOSIS — E559 Vitamin D deficiency, unspecified: Secondary | ICD-10-CM | POA: Insufficient documentation

## 2015-03-29 DIAGNOSIS — M810 Age-related osteoporosis without current pathological fracture: Secondary | ICD-10-CM | POA: Insufficient documentation

## 2015-03-29 DIAGNOSIS — Y658 Other specified misadventures during surgical and medical care: Secondary | ICD-10-CM | POA: Insufficient documentation

## 2015-03-29 DIAGNOSIS — M1990 Unspecified osteoarthritis, unspecified site: Secondary | ICD-10-CM | POA: Insufficient documentation

## 2015-03-29 NOTE — ED Notes (Signed)
Wet to dry dressing placed on pt's right knee.

## 2015-03-29 NOTE — ED Notes (Signed)
Pt's daughter Berenda Morale(Carol Geraghty) has [facility] to [facility] us and she requested to be updated on [phone] specifically on if pt will admitted or sent back to [facility].

## 2015-03-29 NOTE — ED Notes (Signed)
Bed: RESB Expected date: 03/29/15 Expected time: 2:50 PM Means of arrival: Ambulance Comments: Leg injury

## 2015-03-29 NOTE — ED Notes (Signed)
Per EMS: [facility].  Pt had unwitnessed fall.  Recent rt knee replacement.  Nursing home states that wound dehisced after the fall today.  Pt has externally rotated rt leg and is c/o pain in rt knee and rt hip.  Pt placed in C collar due to her dementia preventing her from being cleared C spine.

## 2015-03-29 NOTE — ED Notes (Signed)
Patient still in radiology at this time.

## 2015-03-29 NOTE — ED Notes (Signed)
Pt was given 150 mcg fentanyl en route.

## 2015-03-29 NOTE — ED Notes (Signed)
Patient transported to X-ray 

## 2015-03-29 NOTE — ED Provider Notes (Signed)
CSN: [id number]     Arrival date & time 03/29/15  1456 History   First MD Initiated Contact with Patient 03/29/15 1505     Chief Complaint  Patient presents with  . Fall  . Hip Pain  . Knee Pain     (Consider location/radiation/quality/duration/timing/severity/associated sxs/prior Treatment) HPI Comments: 81yo F w/ PMH including dementia, recent R knee replacement c/b wound dehiscence, CVA who p/w R hip and knee pain. History limited because of the patient's dementia and obtained primarily from EMS report. EMS picked the patient up from her rehabilitation facility for an unwitnessed fall off of a bedside commode. Nursing home told EMS that the wound dehisced after the fall today, however chart review shows recent visit for her wound dehiscence. Patient complaining of right knee and right hip pain.  Level V caveat applies secondary to dementia.  Patient is a 81 y.o. female presenting with fall, hip pain, and knee pain. The history is provided by the EMS personnel.  Fall  Hip Pain  Knee Pain   Past Medical History  Diagnosis Date  . Hypertension   . Abnormality of gait   . Disturbance of skin sensation   . Tachycardia, unspecified   . Internal hemorrhoids without mention of complication   . Synovial cyst of popliteal space   . Spinal stenosis, other region   . Sciatica   . Lumbago   . Unspecified vitamin D deficiency   . Anxiety   . Hyperlipidemia   . Abnormality of gait   . Unspecified sinusitis (chronic)   . Unspecified pruritic disorder   . Pain in joint, lower leg   . Depressive disorder, not elsewhere classified   . Unspecified glaucoma   . Reflux esophagitis   . Diverticulosis of colon (without mention of hemorrhage)   . Pain in joint, site unspecified   . Myalgia and myositis, unspecified   . Osteoporosis, unspecified   . Other malaise and fatigue   . Cervicalgia   . Benign neoplasm of colon   . H/O echocardiogram 09/01/11    EF >55%, mild MR, RV systolic  pressure elevated at 30-40 mmHg, mod TR  . Normal cardiac stress test 11/16/09    low risk scan, EF  77%  . Shortness of breath   . Stroke [facility]) "a couple of years ago"     visual problems left eye  . Arthritis   . Swelling of both ankles     rt leg  . Bruises easily    Past Surgical History  Procedure Laterality Date  . Lumbar laminectomy  12/04/2012    Complete decompressive L3-4 and L4-5--Dr. Darrelyn Hillock  . Abdominal hysterectomy    . Cholecystectomy    . Cardiovascular stress test  11/16/2009    Perfusion defect seen in inferior myocardial region consistent with diaphragmatic attenuation. Remaining myocardium demonstrates normalmyocardial perfusion with no evidence of ischemia or infarct. No ECG changes. EKG negative for ischemia.  . Transthoracic echocardiogram  09/01/2011    EF >55% and moderate tricuspid valve regurg.  . Bilateral oophorectomy    . Total knee arthroplasty Right 02/11/2015    Procedure: TOTAL KNEE ARTHROPLASTY;  Surgeon: Ranee Gosselin, MD;  Location: [facility];  Service: Orthopedics;  Laterality: Right;   Family History  Problem Relation Age of Onset  . Cancer Sister   . Stroke Mother   . Stroke Father    Social History  Substance Use Topics  . Smoking status: Never Smoker   . Smokeless tobacco: None  .  Alcohol Use: No   OB History    No data available     Review of Systems  Unable to perform ROS: Dementia      Allergies  Review of patient's allergies indicates no known allergies.  Home Medications   Prior to Admission medications   Medication Sig Start Date End Date Taking? Authorizing Provider  acetaminophen (TYLENOL) 500 MG tablet Take 1,000 mg by mouth daily as needed for moderate pain.   Yes Historical Provider, MD  acetaminophen (TYLENOL) 500 MG tablet Take 1,000 mg by mouth 3 (three) times daily.   Yes Historical Provider, MD  atorvastatin (LIPITOR) 20 MG tablet TAKE 1 TABLET BY MOUTH EVERY EVENING TO CONTROL CHOLESTEROL 06/23/14  Yes Kimber RelicArthur G  Green, MD  Calcium Carbonate-Vitamin D (CALCIUM 600+D) 600-400 MG-UNIT per tablet Take one tablet by mouth daily for calcium supplement Patient taking differently: Take 1 tablet by mouth daily.  07/02/13  Yes Kimber RelicArthur G Green, MD  cetirizine (ZYRTEC) 10 MG tablet Take 10 mg by mouth daily.  11/26/14  Yes Historical Provider, MD  cholecalciferol (VITAMIN D) 1000 UNITS tablet Take 1,000 Units by mouth daily.    Yes Historical Provider, MD  doxycycline (VIBRA-TABS) 100 MG tablet Take 1 tablet (100 mg total) by mouth 2 (two) times daily. Patient taking differently: Take 100 mg by mouth daily. Scheduled 03/02/15  Yes Amber Celedonio Savageonstable, PA-C  DULoxetine (CYMBALTA) 30 MG capsule One daily to help nerves Patient taking differently: Take 30 mg by mouth daily. One daily to help nerves 01/07/14  Yes Kimber RelicArthur G Green, MD  furosemide (LASIX) 20 MG tablet Take one every other day for diuretic Patient taking differently: Take 20 mg by mouth every other day.  09/24/14  Yes Kimber RelicArthur G Green, MD  latanoprost (XALATAN) 0.005 % ophthalmic solution PLACE 1 DROP IN BOTH EYES AT BEDTIME TO TREAT GLAUCOMA 02/23/15  Yes Kimber RelicArthur G Green, MD  losartan (COZAAR) 100 MG tablet TAKE 1 TABLET BY MOUTH ONCE DAILY FOR BLOOD PRESSURE 11/17/14  Yes Kimber RelicArthur G Green, MD  metoprolol (LOPRESSOR) 100 MG tablet TAKE 1 TABLET BY MOUTH TWICE DAILY TO CONTORL BLOOD PRESSURE 06/23/14  Yes Kimber RelicArthur G Green, MD  Nutritional Supplements (NUTRITIONAL DRINK) LIQD Take 90 mLs by mouth 2 (two) times daily. *Medpass*   Yes Historical Provider, MD  polyethylene glycol (MIRALAX / GLYCOLAX) packet Take 17 g by mouth 2 (two) times daily.   Yes Historical Provider, MD  Protein (PROCEL 100) POWD Take 2 scoop by mouth 2 (two) times daily.   Yes Historical Provider, MD  saccharomyces boulardii (FLORASTOR) 250 MG capsule Take 250 mg by mouth 2 (two) times daily. Started 12/01 stop 12/11   Yes Historical Provider, MD  sennosides-docusate sodium (SENOKOT-S) 8.6-50 MG tablet Take 2  tablets by mouth 2 (two) times daily.   Yes Historical Provider, MD  traMADol (ULTRAM) 50 MG tablet Take 1-2 tablets (50-100 mg total) by mouth every 6 (six) hours as needed for moderate pain. 02/13/15  Yes Amber Celedonio Savageonstable, PA-C  aspirin EC 325 MG EC tablet Take 1 tablet (325 mg total) by mouth 2 (two) times daily. Patient not taking: Reported on 03/29/2015 03/02/15   Amber Constable, PA-C   BP 157/80 mmHg  Pulse 85  Temp(Src) 98.7 F (37.1 C) (Oral)  Resp 18  SpO2 100% Physical Exam  Constitutional: She appears well-developed and well-nourished. No distress.  HENT:  Head: Normocephalic and atraumatic.  Moist mucous membranes  Eyes: Conjunctivae are normal. Pupils are equal, round, and  reactive to light.  Neck:  In c-collar  Cardiovascular: Normal rate, regular rhythm and normal heart sounds.   No murmur heard. Pulmonary/Chest: Effort normal and breath sounds normal. She exhibits no tenderness.  Abdominal: Soft. Bowel sounds are normal. She exhibits no distension. There is no tenderness.  Musculoskeletal:  3+ pitting edema RLE to thigh, 2+ pedal pulses, large dehisced wound over R knee draining serosanginous fluid, R foot externally rotated, no hip tenderness; normal sensation b/l feet; 5/5 grip strength b/l  Neurological:  Awake, able to follow basic commands  Skin: Skin is warm.  Large dehisced wound over R knee  Psychiatric: She has a normal mood and affect. Judgment normal.  Nursing note and vitals reviewed.     ED Course  Procedures (including critical care time) Labs Review Labs Reviewed - No data to display  Imaging Review Dg Chest 2 View  03/29/2015  CLINICAL DATA:  81 year old who had an unwitnessed fall at the nursing home. Recent right total knee arthroplasty 03/15/2015. As a result the fall, there is dehiscence of the knee incision. Patient also complains of right hip pain. EXAM: CHEST  1 VIEW COMPARISON:  04/25/2014 and earlier. FINDINGS: AP supine examination  was obtained. Suboptimal inspiration. Cardiac silhouette mildly enlarged, unchanged allowing for differences in technique. Lungs clear. Bronchovascular markings normal. Pulmonary vascularity normal. No visible pleural effusions. No pneumothorax. Prominent costal cartilage calcifications throughout. IMPRESSION: Suboptimal inspiration. No acute cardiopulmonary disease. Stable mild cardiomegaly without pulmonary edema. Electronically Signed   By: Hulan Saas M.D.   On: 03/29/2015 16:09   Ct Head Wo Contrast  03/29/2015  CLINICAL DATA:  Unwitnessed fall.  Dementia. EXAM: CT HEAD WITHOUT CONTRAST CT CERVICAL SPINE WITHOUT CONTRAST TECHNIQUE: Multidetector CT imaging of the head and cervical spine was performed following the standard protocol without intravenous contrast. Multiplanar CT image reconstructions of the cervical spine were also generated. COMPARISON:  03/15/2015 FINDINGS: CT HEAD FINDINGS Remote right posterior cerebral artery distribution infarct. Mild chronic ischemic microvascular white matter disease. Age-appropriate atrophy. No intracranial hemorrhage, mass lesion, or acute CVA. Cutaneous lesion along the right scalp similar to prior, possibly a sebaceous cyst. Chronic right maxillary sinusitis. Acute left sphenoid sinusitis with frothy fluid and in internal calcifications similar to prior. CT CERVICAL SPINE FINDINGS Transverse ligament calcification and C1 with spurring and C1-2. 1.5 mm degenerative retrolisthesis at C5-6. Loss of disc height at all levels between C3 and C6 with associated posterior osseous ridging. No cervical spine fracture or subluxation is identified. Uncinate and facet spurring cause osseous foraminal stenosis on the right at C3-4, C4-5, and C5-6; and on the left at C3-4, and C4-5. Aortic arch atherosclerosis noted. IMPRESSION: 1. No acute intracranial findings or acute cervical spine findings. 2. There is acute left sphenoid sinusitis similar to prior, along with chronic  right maxillary sinusitis. 3. Cervical spondylosis and degenerative disc disease causing multilevel bony foraminal impingement. 4. Remote right PCA distribution infarct. Periventricular white matter and corona radiata hypodensities favor chronic ischemic microvascular white matter disease. Electronically Signed   By: Gaylyn Rong M.D.   On: 03/29/2015 16:48   Ct Cervical Spine Wo Contrast  03/29/2015  CLINICAL DATA:  Unwitnessed fall.  Dementia. EXAM: CT HEAD WITHOUT CONTRAST CT CERVICAL SPINE WITHOUT CONTRAST TECHNIQUE: Multidetector CT imaging of the head and cervical spine was performed following the standard protocol without intravenous contrast. Multiplanar CT image reconstructions of the cervical spine were also generated. COMPARISON:  03/15/2015 FINDINGS: CT HEAD FINDINGS Remote right posterior cerebral artery distribution  infarct. Mild chronic ischemic microvascular white matter disease. Age-appropriate atrophy. No intracranial hemorrhage, mass lesion, or acute CVA. Cutaneous lesion along the right scalp similar to prior, possibly a sebaceous cyst. Chronic right maxillary sinusitis. Acute left sphenoid sinusitis with frothy fluid and in internal calcifications similar to prior. CT CERVICAL SPINE FINDINGS Transverse ligament calcification and C1 with spurring and C1-2. 1.5 mm degenerative retrolisthesis at C5-6. Loss of disc height at all levels between C3 and C6 with associated posterior osseous ridging. No cervical spine fracture or subluxation is identified. Uncinate and facet spurring cause osseous foraminal stenosis on the right at C3-4, C4-5, and C5-6; and on the left at C3-4, and C4-5. Aortic arch atherosclerosis noted. IMPRESSION: 1. No acute intracranial findings or acute cervical spine findings. 2. There is acute left sphenoid sinusitis similar to prior, along with chronic right maxillary sinusitis. 3. Cervical spondylosis and degenerative disc disease causing multilevel bony foraminal  impingement. 4. Remote right PCA distribution infarct. Periventricular white matter and corona radiata hypodensities favor chronic ischemic microvascular white matter disease. Electronically Signed   By: Gaylyn Rong M.D.   On: 03/29/2015 16:48   Dg Knee Complete 4 Views Right  03/29/2015  CLINICAL DATA:  Recent fall with right knee pain, initial encounter EXAM: RIGHT KNEE - COMPLETE 4+ VIEW COMPARISON:  03/15/2015 FINDINGS: There are changes consistent with a right knee arthroplasty. No loosening or acute prostatic abnormality is seen. No joint effusion is noted. No soft tissue changes are seen. IMPRESSION: Postoperative change without acute abnormality. Electronically Signed   By: Alcide Clever M.D.   On: 03/29/2015 16:06   Dg Hip Unilat  With Pelvis 2-3 Views Right  03/29/2015  CLINICAL DATA:  Recent fall with right hip and right knee pain, initial encounter EXAM: DG HIP (WITH OR WITHOUT PELVIS) 2-3V RIGHT COMPARISON:  None. FINDINGS: The pelvic ring is intact. Multiple phleboliths are seen. Postsurgical changes are noted in the pelvis. No acute fracture or dislocation is noted. No soft tissue changes are seen. IMPRESSION: No acute abnormality noted. Electronically Signed   By: Alcide Clever M.D.   On: 03/29/2015 16:04     EKG Interpretation None      MDM   Final diagnoses:  Fall, initial encounter  Wound dehiscence, subsequent encounter   Patient presenting with right hip and right knee pain after an unwitnessed fall at her facility. Patient with baseline dementia and recent wound dehiscence after knee replacement. On exam, the patient was comfortable and in no acute distress. Initial vital signs notable for O2 sat 85% on room air; the patient had received fentanyl from EMS. On my examination, the patient was breathing comfortably and O2 sat stable on room air. Obtained plain films of right hip and knee as well as chest x-ray and CT of head and C-spine given an unwitnessed  fall.  Imaging was negative for acute injury. I reviewed the patient's chart which showed that she had already had wound dehiscence at recent visit for a fall. Her examination from today shows slight improvement from pictures at previous visit, therefore I feel she is safe for discharge home with close wound team follow-up. Wet-to-dry dressing placed and patient discharged in satisfactory condition.  Laurence Spates, MD 03/30/15 [phone]

## 2015-03-30 ENCOUNTER — Telehealth: Payer: Self-pay | Admitting: *Deleted

## 2015-03-30 NOTE — Telephone Encounter (Signed)
[facility] Please refer the question to her.

## 2015-03-30 NOTE — Telephone Encounter (Signed)
[facility] stating that patient has a CT scheduled for 03/31/15 for CT of Head but patient just had one on 03/29/2015 and copy of report in EPIC. They want to know if they should continue with this order and do another one or disregard it? Please Advise.  [facility] [place] # [phone] Fax: [phone]

## 2015-03-30 NOTE — Telephone Encounter (Signed)
Routed to Dr. Glade LloydPandey.

## 2015-03-31 ENCOUNTER — Ambulatory Visit (HOSPITAL_COMMUNITY): Payer: Medicare Other

## 2015-03-31 NOTE — Telephone Encounter (Signed)
[facility] Reception[facility].

## 2015-04-06 ENCOUNTER — Non-Acute Institutional Stay (SKILLED_NURSING_FACILITY): Payer: Medicare Other | Admitting: Nurse Practitioner

## 2015-04-06 ENCOUNTER — Telehealth: Payer: Self-pay | Admitting: Neurology

## 2015-04-06 ENCOUNTER — Encounter: Payer: Self-pay | Admitting: Nurse Practitioner

## 2015-04-06 DIAGNOSIS — I10 Essential (primary) hypertension: Secondary | ICD-10-CM | POA: Diagnosis not present

## 2015-04-06 DIAGNOSIS — K5900 Constipation, unspecified: Secondary | ICD-10-CM | POA: Diagnosis not present

## 2015-04-06 DIAGNOSIS — R410 Disorientation, unspecified: Secondary | ICD-10-CM

## 2015-04-06 DIAGNOSIS — F419 Anxiety disorder, unspecified: Secondary | ICD-10-CM

## 2015-04-06 DIAGNOSIS — B373 Candidiasis of vulva and vagina: Secondary | ICD-10-CM

## 2015-04-06 DIAGNOSIS — F05 Delirium due to known physiological condition: Secondary | ICD-10-CM | POA: Diagnosis not present

## 2015-04-06 DIAGNOSIS — R600 Localized edema: Secondary | ICD-10-CM

## 2015-04-06 DIAGNOSIS — B3731 Acute candidiasis of vulva and vagina: Secondary | ICD-10-CM

## 2015-04-06 NOTE — Assessment & Plan Note (Signed)
Blood pressure is controlled, continue Metoprolol 100mg  bid, Cozaar 100mg , Furosemide 20mg .

## 2015-04-06 NOTE — Telephone Encounter (Signed)
Rn call patients [facility] is currently in rehab facility ,and has been there for 6 weeks. Rn ask when was patient getting discharge, son did not have a discharge date. Rn explain that there is a medical team to assess his mom for long term placement. Rn explain that Kristin Wilkerson does not have any appts for January. Son will ask the medical team at the facility. PTs son stated she is getting more confuse now,and is declining neurology, Pt has a follow up with Kristin Wilkerson in April 2017. Pts son will call back for sooner appt if any become available.

## 2015-04-06 NOTE — Assessment & Plan Note (Signed)
No c/o pain upon my visit today, continue Cymbalta 30mg 

## 2015-04-06 NOTE — Telephone Encounter (Signed)
Son Arlys JohnBrian [phone] called to advise patient has had a rapid decline and in order to get placement at long term care facility, patient needs to be assessed.

## 2015-04-06 NOTE — Assessment & Plan Note (Signed)
Improving, continue Nystatin powder to groins for total 3 weeks.

## 2015-04-06 NOTE — Progress Notes (Signed)
Patient ID: Kristin Wilkerson, female   DOB: [birth year], 81 y.o.   MRN: [id number]  Location:  SNF [facility] Provider:  Chipper OmanManXie Markeia Harkless NP  Code Status:  DNR Goals of care: Advanced Directive information    Chief Complaint  Patient presents with  . Medical Management of Chronic Issues  . Acute Visit    UA C/S per POA request, rash in groins, confusion.      HPI: Patient is a 81 y.o. female [facility] in [facility] today for evaluation of  UA C/S per POA request due to persisted confusion, the patient is afebrile, no noted suprapubic or CVA tenderness, denied nausea, vomiting, diarrhea, or bloody urine. There was excoriated skin rash in groin areas, but its better si. nce Nystatin powder to the area 04/03/15. No new respiratory symptoms since last PNA treated.   Review of Systems  Constitutional: Negative.   HENT: Positive for hearing loss.   Eyes:       Right hemianopsia  Respiratory: Positive for cough. Negative for shortness of breath and wheezing (no audible wheezes today).        Slight rattling noise when she speaks along with some hoarseness.  Cardiovascular: Positive for leg swelling. Negative for chest pain and palpitations.  Gastrointestinal: Negative.   Genitourinary: Positive for frequency. Negative for urgency.       Nocturia  Musculoskeletal: Positive for myalgias (legs hurt in calves).  Skin: Positive for rash.       Excoriated groins skin folds.   Neurological: Negative for dizziness, tremors and headaches.       Tingling sensations in lower extremities. TIA/CVA 03/07/2013. Has had resolution of the decreased vision in the right eye and the left facial droop.  Psychiatric/Behavioral:       Can't get her husband's death out of her mind-reports she misses her husband Baseline confusion, but able to follow simple commands.     Past Medical History  Diagnosis Date  . Hypertension   . Abnormality of gait   . Disturbance of skin sensation   . Tachycardia,  unspecified   . Internal hemorrhoids without mention of complication   . Synovial cyst of popliteal space   . Spinal stenosis, other region   . Sciatica   . Lumbago   . Unspecified vitamin D deficiency   . Anxiety   . Hyperlipidemia   . Abnormality of gait   . Unspecified sinusitis (chronic)   . Unspecified pruritic disorder   . Pain in joint, lower leg   . Depressive disorder, not elsewhere classified   . Unspecified glaucoma   . Reflux esophagitis   . Diverticulosis of colon (without mention of hemorrhage)   . Pain in joint, site unspecified   . Myalgia and myositis, unspecified   . Osteoporosis, unspecified   . Other malaise and fatigue   . Cervicalgia   . Benign neoplasm of colon   . H/O echocardiogram 09/01/11    EF >55%, mild MR, RV systolic pressure elevated at 91-4730-40 mmHg, mod TR  . Normal cardiac stress test 11/16/09    low risk scan, EF  77%  . Shortness of breath   . Stroke [facility](HCC) "a couple of years ago"     visual problems left eye  . Arthritis   . Swelling of both ankles     rt leg  . Bruises easily     Patient Active Problem List   Diagnosis Date Noted  . Constipation 04/06/2015  . Subacute  confusional state 04/06/2015  . Postoperative wound dehiscence 03/01/2015  . History of total knee arthroplasty 02/11/2015  . 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 Monilial vulvovaginitis 09/24/2014  . Cough 09/24/2014  . Tracheobronchitis 08/06/2014  . Deaf 07/02/2014  . Right knee pain 07/02/2014  . History of shingles 2015 02/14/2014  . Depression, major, recurrent, moderate (HCC) 01/07/2014  . Grief 11/27/2013  . Left arm pain 11/06/2013  . Corn of foot 10/23/2013  . Leg edema 09/03/2013  . Insomnia 07/02/2013  . Diastolic CHF (HCC) 03/02/2013  . Pulmonary hypertension (HCC) 03/02/2013  . History of CVA (cerebrovascular accident) 03/01/2013  . HTN (hypertension) 03/01/2013  . HLD (hyperlipidemia) 03/01/2013  . Tachycardia 12/10/2012  . Anxiety 12/10/2012  .  Myalgia and myositis, unspecified 09/18/2012  . Other malaise and fatigue 09/18/2012  . Backache 03/31/2008    No Known Allergies  Medications: Patient's Medications  New Prescriptions   No medications on file  Previous Medications   ACETAMINOPHEN (TYLENOL) 500 MG TABLET    Take 1,000 mg by mouth daily as needed for moderate pain.   ACETAMINOPHEN (TYLENOL) 500 MG TABLET    Take 1,000 mg by mouth 3 (three) times daily.   ASPIRIN EC 325 MG EC TABLET    Take 1 tablet (325 mg total) by mouth 2 (two) times daily.   ATORVASTATIN (LIPITOR) 20 MG TABLET    TAKE 1 TABLET BY MOUTH EVERY EVENING TO CONTROL CHOLESTEROL   CALCIUM CARBONATE-VITAMIN D (CALCIUM 600+D) 600-400 MG-UNIT PER TABLET    Take one tablet by mouth daily for calcium supplement   CETIRIZINE (ZYRTEC) 10 MG TABLET    Take 10 mg by mouth daily.    CHOLECALCIFEROL (VITAMIN D) 1000 UNITS TABLET    Take 1,000 Units by mouth daily.    DOXYCYCLINE (VIBRA-TABS) 100 MG TABLET    Take 1 tablet (100 mg total) by mouth 2 (two) times daily.   DULOXETINE (CYMBALTA) 30 MG CAPSULE    One daily to help nerves   FUROSEMIDE (LASIX) 20 MG TABLET    Take one every other day for diuretic   LATANOPROST (XALATAN) 0.005 % OPHTHALMIC SOLUTION    PLACE 1 DROP IN BOTH EYES AT BEDTIME TO TREAT GLAUCOMA   LOSARTAN (COZAAR) 100 MG TABLET    TAKE 1 TABLET BY MOUTH ONCE DAILY FOR BLOOD PRESSURE   METOPROLOL (LOPRESSOR) 100 MG TABLET    TAKE 1 TABLET BY MOUTH TWICE DAILY TO CONTORL BLOOD PRESSURE   NUTRITIONAL SUPPLEMENTS (NUTRITIONAL DRINK) LIQD    Take 90 mLs by mouth 2 (two) times daily. *Medpass*   POLYETHYLENE GLYCOL (MIRALAX / GLYCOLAX) PACKET    Take 17 g by mouth 2 (two) times daily.   PROTEIN (PROCEL 100) POWD    Take 2 scoop by mouth 2 (two) times daily.   SACCHAROMYCES BOULARDII (FLORASTOR) 250 MG CAPSULE    Take 250 mg by mouth 2 (two) times daily. Started 12/01 stop 12/11   SENNOSIDES-DOCUSATE SODIUM (SENOKOT-S) 8.6-50 MG TABLET    Take 2 tablets by  mouth 2 (two) times daily.   TRAMADOL (ULTRAM) 50 MG TABLET    Take 1-2 tablets (50-100 mg total) by mouth every 6 (six) hours as needed for moderate pain.  Modified Medications   No medications on file  Discontinued Medications   No medications on file    Physical Exam: Filed Vitals:   04/06/15 1520  BP: 126/66  Pulse: 67  Temp: 96.6 F (35.9 C)  TempSrc: Tympanic  Resp: 16  There is no weight on file to calculate BMI.  Physical Exam  Constitutional: She is oriented to person, place, and time. She appears well-developed and well-nourished. No distress.  HENT:  Head: Normocephalic.  Right Ear: External ear normal.  Left Ear: External ear normal.  Nose: Nose normal.  Bilateral loss of hearing Torus palatinus  Eyes: Conjunctivae and EOM are normal. Pupils are equal, round, and reactive to light.  Mild right hemianopsia laterally  Neck: Normal range of motion. Neck supple. No JVD present. No tracheal deviation present. No thyromegaly present.  Cardiovascular: Exam reveals no distant heart sounds and no friction rub.   Murmur heard.  Systolic murmur is present with a grade of 2/6  Holosystolic  Pulmonary/Chest: No respiratory distress. She has no wheezes. She has no rales. She exhibits no tenderness.  Abdominal: Soft. Bowel sounds are normal. She exhibits no distension and no mass. There is no tenderness.  Musculoskeletal: She exhibits edema and tenderness.  Pain in the right knee. Wearing a Velcro tubular brace.  Lymphadenopathy:    She has no cervical adenopathy.  Neurological: She is alert and oriented to person, place, and time. She has normal reflexes.  Skin: Skin is warm and dry. Rash noted. No erythema. No pallor.  Excoriated groins skin folds.   Psychiatric: She has a normal mood and affect. Her behavior is normal. Judgment and thought content normal.  Baseline confusion    Labs reviewed: Basic Metabolic Panel:  Recent Labs  16/10/96 1118 02/17/15  03/01/15 1221 03/02/15 0450  NA 139 143 141 139  K 3.7 3.8 3.2* 3.1*  CL 99*  --  107 106  CO2 31  --  23 25  GLUCOSE 140*  --  111* 102*  BUN 13 32* 18 19  CREATININE 0.82 0.9 0.89 0.77  CALCIUM 8.9  --  9.1 8.6*    Liver Function Tests:  Recent Labs  02/03/15 1525 03/02/15 0450  AST 26 19  ALT 21 12*  ALKPHOS 73 83  BILITOT 0.7 1.0  PROT 7.0 5.7*  ALBUMIN 4.2 2.7*    CBC:  Recent Labs  02/03/15 1525  02/14/15 0615 02/17/15 03/01/15 1221 03/02/15 0450  WBC 6.3  < > 11.3* 9.1 10.1 7.0  NEUTROABS 3.5  --   --   --  7.9* 4.3  HGB 13.3  < > 11.2* 10.6* 9.9* 9.2*  HCT 41.0  < > 34.2* 33* 30.7* 29.1*  MCV 93.8  < > 92.2  --  92.7 91.2  PLT 197  < > 207 295 474* 414*  < > = values in this interval not displayed.  Lab Results  Component Value Date   TSH 3.190 02/21/2014   Lab Results  Component Value Date   HGBA1C 5.7* 03/01/2013   Lab Results  Component Value Date   CHOL 179 02/21/2014   HDL 46 02/21/2014   LDLCALC 89 02/21/2014   TRIG 219* 02/21/2014   CHOLHDL 3.9 02/21/2014    Significant Diagnostic Results since last visit: none  Patient Care Team: Kimber Relic, MD as PCP - General (Internal Medicine) Runell Gess, MD as Consulting Physician (Cardiology) Micki Riley, MD as Consulting Physician (Neurology)  Assessment/Plan Problem List Items Addressed This Visit    Anxiety    No c/o pain upon my visit today, continue Cymbalta        Constipation    Stable, continue MiraLax bid, Senna S II bid.       HTN (hypertension) (Chronic)  Blood pressure is controlled, continue Metoprolol  bid, Cozaar , Furosemide .       Leg edema (Chronic)    Chronic, continue Furosemide  qod.       Monilial vulvovaginitis    Improving, continue Nystatin powder to groins for total 3 weeks.       Subacute confusional state - Primary    Baseline confusion, UA C/S to r/o UTI.           Family/ staff Communication:  observe for altered mental status/confusion.   Labs/tests ordered: UA C/S  [facility] Cambrea Kirt NP [facility] [address], [place] [address] On Call:  [phone] & follow prompts after 5pm & weekends Office Phone:  [phone] Office Fax:  [phone]

## 2015-04-06 NOTE — Assessment & Plan Note (Signed)
Stable, continue MiraLax bid, Senna S II bid.

## 2015-04-06 NOTE — Assessment & Plan Note (Signed)
Baseline confusion, UA C/S to r/o UTI.

## 2015-04-06 NOTE — Assessment & Plan Note (Signed)
Chronic, continue Furosemide 20mg  qod.

## 2015-04-07 DIAGNOSIS — Z96651 Presence of right artificial knee joint: Secondary | ICD-10-CM | POA: Diagnosis not present

## 2015-04-07 DIAGNOSIS — Z471 Aftercare following joint replacement surgery: Secondary | ICD-10-CM | POA: Diagnosis not present

## 2015-04-07 DIAGNOSIS — T8131XD Disruption of external operation (surgical) wound, not elsewhere classified, subsequent encounter: Secondary | ICD-10-CM | POA: Diagnosis not present

## 2015-04-14 DIAGNOSIS — Z471 Aftercare following joint replacement surgery: Secondary | ICD-10-CM | POA: Diagnosis not present

## 2015-04-14 DIAGNOSIS — T8131XD Disruption of external operation (surgical) wound, not elsewhere classified, subsequent encounter: Secondary | ICD-10-CM | POA: Diagnosis not present

## 2015-04-14 DIAGNOSIS — Z96651 Presence of right artificial knee joint: Secondary | ICD-10-CM | POA: Diagnosis not present

## 2015-04-16 ENCOUNTER — Other Ambulatory Visit (HOSPITAL_COMMUNITY): Payer: Self-pay | Admitting: *Deleted

## 2015-04-16 ENCOUNTER — Other Ambulatory Visit: Payer: Self-pay | Admitting: Surgical

## 2015-04-16 DIAGNOSIS — T8131XD Disruption of external operation (surgical) wound, not elsewhere classified, subsequent encounter: Secondary | ICD-10-CM | POA: Diagnosis not present

## 2015-04-16 DIAGNOSIS — Z471 Aftercare following joint replacement surgery: Secondary | ICD-10-CM | POA: Diagnosis not present

## 2015-04-16 DIAGNOSIS — Z96651 Presence of right artificial knee joint: Secondary | ICD-10-CM | POA: Diagnosis not present

## 2015-04-16 NOTE — Progress Notes (Signed)
01-22-15 echo epic  ekg 01-13-15 epic lov dr berry cardiology 03-06-15 epic

## 2015-04-16 NOTE — Patient Instructions (Addendum)
KEYONIA GLUTH  04/16/2015   Your procedure is scheduled on: 04-21-15  Report to [facility]  Entrance take [facility]  elevators to 3rd floor to  Short Stay Center at 1100 AM.  Call this number if you have problems the morning of surgery [phone]   Remember: ONLY 1 PERSON MAY GO WITH YOU TO SHORT STAY TO GET  READY MORNING OF YOUR SURGERY.  Do not eat food :After Midnight, Monday night may have clear liquids midnight until 730 am day of surgery, nothing by mouth after 730 am day of surgery.   FACILITY TO BRING PATIENT   Take these medicines the morning of surgery with A SIP OF WATER: Certrizine (Zyrtec), Duloxetine (Cymbalta), Metoprolol (Cymbalta)               PLEASE SEND COPY OF LATEST MEDICATION RECORD INCLUDING WHAT TIME LAST MEDICATIONS TAKEN AND LAST TIME PATIENT ATE AND DRANK WITH PATIENT DAY OF SURGERY. PLEASE CALL Savanna Dooley RN PRE OP IF QUESTIONS [phone]   CLEAR LIQUID DIET   Foods Allowed                                                                     Foods Excluded  Coffee and tea, regular and decaf                             liquids that you cannot  Plain Jell-O in any flavor                                             see through such as: Fruit ices (not with fruit pulp)                                     milk, soups, orange juice  Iced Popsicles                                    All solid food Carbonated beverages, regular and diet                                    Cranberry, grape and apple juices Sports drinks like Gatorade Lightly seasoned clear broth or consume(fat free) Sugar, honey syrup  Sample Menu Breakfast                                Lunch                                     Supper Cranberry juice  Beef broth                            Chicken broth Jell-O                                     Grape juice                           Apple juice Coffee or tea                        Jell-O                                       Popsicle                                                Coffee or tea                        Coffee or tea  _____________________________________________________________________                Bonita Quin may not have any metal on your body including hair pins and              piercings  Do not wear jewelry, make-up, lotions, powders or perfumes, deodorant             Do not wear nail polish.  Do not shave  48 hours prior to surgery.              Men may shave face and neck.   Do not bring valuables to the hospital. [facility] IS NOT             RESPONSIBLE   FOR VALUABLES.  Contacts, dentures or bridgework may not be worn into surgery.  Leave suitcase in the car. After surgery it may be brought to your room.                                        Please read over the following fact sheets you were given: _____________________________________________________________________             [facility] - Preparing for Surgery Before surgery, you can play an important role.  Because skin is not sterile, your skin needs to be as free of germs as possible.  You can reduce the number of germs on your skin by washing with CHG (chlorahexidine gluconate) soap before surgery.  CHG is an antiseptic cleaner which kills germs and bonds with the skin to continue killing germs even after washing. Please DO NOT use if you have an allergy to CHG or antibacterial soaps.  If your skin becomes reddened/irritated stop using the CHG and inform your nurse when you arrive at Short Stay. Do not shave (including legs and underarms) for at least 48 hours prior to the first CHG shower.  You may shave your face/neck. Please follow these instructions carefully:  1.  Shower with CHG  Soap the night before surgery and the  morning of Surgery.  2.  If you choose to wash your hair, wash your hair first as usual with your  normal  shampoo.  3.  After you shampoo, rinse your hair and body thoroughly to  remove the  shampoo.                           4.  Use CHG as you would any other liquid soap.  You can apply chg directly  to the skin and wash                       Gently with a scrungie or clean washcloth.  5.  Apply the CHG Soap to your body ONLY FROM THE NECK DOWN.   Do not use on face/ open                           Wound or open sores. Avoid contact with eyes, ears mouth and genitals (private parts).                       Wash face,  Genitals (private parts) with your normal soap.             6.  Wash thoroughly, paying special attention to the area where your surgery  will be performed.  7.  Thoroughly rinse your body with warm water from the neck down.  8.  DO NOT shower/wash with your normal soap after using and rinsing off  the CHG Soap.                9.  Pat yourself dry with a clean towel.            10.  Wear clean pajamas.            11.  Place clean sheets on your bed the night of your first shower and do not  sleep with pets. Day of Surgery : Do not apply any lotions/deodorants the morning of surgery.  Please wear clean clothes to the hospital/surgery center.  FAILURE TO FOLLOW THESE INSTRUCTIONS MAY RESULT IN THE CANCELLATION OF YOUR SURGERY PATIENT SIGNATURE_________________________________  NURSE SIGNATURE__________________________________  ________________________________________________________________________   Rogelia MireIncentive Spirometer  An incentive spirometer is a tool that can help keep your lungs clear and active. This tool measures how well you are filling your lungs with each breath. Taking long deep breaths may help reverse or decrease the chance of developing breathing (pulmonary) problems (especially infection) following:  A long period of time when you are unable to move or be active. BEFORE THE PROCEDURE   If the spirometer includes an indicator to show your best effort, your nurse or respiratory therapist will set it to a desired goal.  If possible, sit  up straight or lean slightly forward. Try not to slouch.  Hold the incentive spirometer in an upright position. INSTRUCTIONS FOR USE   Sit on the edge of your bed if possible, or sit up as far as you can in bed or on a chair.  Hold the incentive spirometer in an upright position.  Breathe out normally.  Place the mouthpiece in your mouth and seal your lips tightly around it.  Breathe in slowly and as deeply as possible, raising the piston or the ball toward the top of the column.  Hold your breath  for 3-5 seconds or for as long as possible. Allow the piston or ball to fall to the bottom of the column.  Remove the mouthpiece from your mouth and breathe out normally.  Rest for a few seconds and repeat Steps 1 through 7 at least 10 times every 1-2 hours when you are awake. Take your time and take a few normal breaths between deep breaths.  The spirometer may include an indicator to show your best effort. Use the indicator as a goal to work toward during each repetition.  After each set of 10 deep breaths, practice coughing to be sure your lungs are clear. If you have an incision (the cut made at the time of surgery), support your incision when coughing by placing a pillow or rolled up towels firmly against it. Once you are able to get out of bed, walk around indoors and cough well. You may stop using the incentive spirometer when instructed by your caregiver.  RISKS AND COMPLICATIONS  Take your time so you do not get dizzy or light-headed.  If you are in pain, you may need to take or ask for pain medication before doing incentive spirometry. It is harder to take a deep breath if you are having pain. AFTER USE  Rest and breathe slowly and easily.  It can be helpful to keep track of a log of your progress. Your caregiver can provide you with a simple table to help with this. If you are using the spirometer at home, follow these instructions: SEEK MEDICAL CARE IF:   You are having  difficultly using the spirometer.  You have trouble using the spirometer as often as instructed.  Your pain medication is not giving enough relief while using the spirometer.  You develop fever of 100.5 F (38.1 C) or higher. SEEK IMMEDIATE MEDICAL CARE IF:   You cough up bloody sputum that had not been present before.  You develop fever of 102 F (38.9 C) or greater.  You develop worsening pain at or near the incision site. MAKE SURE YOU:   Understand these instructions.  Will watch your condition.  Will get help right away if you are not doing well or get worse. Document Released: 08/15/2006 Document Revised: 06/27/2011 Document Reviewed: 10/16/2006 ExitCare Patient Information 2014 ExitCare, [place].   ________________________________________________________________________  WHAT IS A BLOOD TRANSFUSION? Blood Transfusion Information  A transfusion is the replacement of blood or some of its parts. Blood is made up of multiple cells which provide different functions.  Red blood cells carry oxygen and are used for blood loss replacement.  White blood cells fight against infection.  Platelets control bleeding.  Plasma helps clot blood.  Other blood products are available for specialized needs, such as hemophilia or other clotting disorders. BEFORE THE TRANSFUSION  Who gives blood for transfusions?   Healthy volunteers who are fully evaluated to make sure their blood is safe. This is blood bank blood. Transfusion therapy is the safest it has ever been in the practice of medicine. Before blood is taken from a donor, a complete history is taken to make sure that person has no history of diseases nor engages in risky social behavior (examples are intravenous drug use or sexual activity with multiple partners). The donor's travel history is screened to minimize risk of transmitting infections, such as malaria. The donated blood is tested for signs of infectious diseases, such as  HIV and hepatitis. The blood is then tested to be sure it is compatible with you  in order to minimize the chance of a transfusion reaction. If you or a relative donates blood, this is often done in anticipation of surgery and is not appropriate for emergency situations. It takes many days to process the donated blood. RISKS AND COMPLICATIONS Although transfusion therapy is very safe and saves many lives, the main dangers of transfusion include:   Getting an infectious disease.  Developing a transfusion reaction. This is an allergic reaction to something in the blood you were given. Every precaution is taken to prevent this. The decision to have a blood transfusion has been considered carefully by your caregiver before blood is given. Blood is not given unless the benefits outweigh the risks. AFTER THE TRANSFUSION  Right after receiving a blood transfusion, you will usually feel much better and more energetic. This is especially true if your red blood cells have gotten low (anemic). The transfusion raises the level of the red blood cells which carry oxygen, and this usually causes an energy increase.  The nurse administering the transfusion will monitor you carefully for complications. HOME CARE INSTRUCTIONS  No special instructions are needed after a transfusion. You may find your energy is better. Speak with your caregiver about any limitations on activity for underlying diseases you may have. SEEK MEDICAL CARE IF:   Your condition is not improving after your transfusion.  You develop redness or irritation at the intravenous (IV) site. SEEK IMMEDIATE MEDICAL CARE IF:  Any of the following symptoms occur over the next 12 hours:  Shaking chills.  You have a temperature by mouth above 102 F (38.9 C), not controlled by medicine.  Chest, back, or muscle pain.  People around you feel you are not acting correctly or are confused.  Shortness of breath or difficulty breathing.  Dizziness  and fainting.  You get a rash or develop hives.  You have a decrease in urine output.  Your urine turns a dark color or changes to pink, red, or brown. Any of the following symptoms occur over the next 10 days:  You have a temperature by mouth above 102 F (38.9 C), not controlled by medicine.  Shortness of breath.  Weakness after normal activity.  The white part of the eye turns yellow (jaundice).  You have a decrease in the amount of urine or are urinating less often.  Your urine turns a dark color or changes to pink, red, or brown. Document Released: 04/01/2000 Document Revised: 06/27/2011 Document Reviewed: 11/19/2007 [facility] Patient Information 2014 [place], [place].  _______________________________________________________________________

## 2015-04-17 ENCOUNTER — Encounter (HOSPITAL_COMMUNITY): Payer: Self-pay

## 2015-04-17 ENCOUNTER — Ambulatory Visit (HOSPITAL_COMMUNITY)
Admission: RE | Admit: 2015-04-17 | Discharge: 2015-04-17 | Disposition: A | Discharge status: Home / Self Care | Payer: Medicare Other | Source: Ambulatory Visit | Attending: Anesthesiology | Admitting: Anesthesiology

## 2015-04-17 ENCOUNTER — Encounter (HOSPITAL_COMMUNITY)
Admission: RE | Admit: 2015-04-17 | Discharge: 2015-04-17 | Disposition: A | Discharge status: Home / Self Care | Payer: Medicare Other | Source: Ambulatory Visit | Attending: Orthopedic Surgery | Admitting: Orthopedic Surgery

## 2015-04-17 DIAGNOSIS — Z96659 Presence of unspecified artificial knee joint: Secondary | ICD-10-CM | POA: Diagnosis not present

## 2015-04-17 DIAGNOSIS — I10 Essential (primary) hypertension: Secondary | ICD-10-CM | POA: Diagnosis not present

## 2015-04-17 DIAGNOSIS — Z471 Aftercare following joint replacement surgery: Secondary | ICD-10-CM | POA: Diagnosis not present

## 2015-04-17 DIAGNOSIS — Z01818 Encounter for other preprocedural examination: Secondary | ICD-10-CM | POA: Insufficient documentation

## 2015-04-17 LAB — CBC WITH DIFFERENTIAL/PLATELET
Basophils Absolute: 0 10*3/uL (ref 0.0–0.1)
Basophils Relative: 0 %
Eosinophils Absolute: 0.3 10*3/uL (ref 0.0–0.7)
Eosinophils Relative: 4 %
HCT: 32.7 % — ABNORMAL LOW (ref 36.0–46.0)
Hemoglobin: 10.4 g/dL — ABNORMAL LOW (ref 12.0–15.0)
Lymphocytes Relative: 22 %
Lymphs Abs: 1.8 10*3/uL (ref 0.7–4.0)
MCH: 28.7 pg (ref 26.0–34.0)
MCHC: 31.8 g/dL (ref 30.0–36.0)
MCV: 90.3 fL (ref 78.0–100.0)
Monocytes Absolute: 1.1 10*3/uL — ABNORMAL HIGH (ref 0.1–1.0)
Monocytes Relative: 13 %
Neutro Abs: 5.1 10*3/uL (ref 1.7–7.7)
Neutrophils Relative %: 61 %
Platelets: 476 10*3/uL — ABNORMAL HIGH (ref 150–400)
RBC: 3.62 MIL/uL — ABNORMAL LOW (ref 3.87–5.11)
RDW: 16.7 % — ABNORMAL HIGH (ref 11.5–15.5)
WBC: 8.3 10*3/uL (ref 4.0–10.5)

## 2015-04-17 LAB — COMPREHENSIVE METABOLIC PANEL
ALT: 13 U/L — ABNORMAL LOW (ref 14–54)
AST: 18 U/L (ref 15–41)
Albumin: 2.9 g/dL — ABNORMAL LOW (ref 3.5–5.0)
Alkaline Phosphatase: 120 U/L (ref 38–126)
Anion gap: 9 (ref 5–15)
BUN: 18 mg/dL (ref 6–20)
CO2: 27 mmol/L (ref 22–32)
Calcium: 8.7 mg/dL — ABNORMAL LOW (ref 8.9–10.3)
Chloride: 104 mmol/L (ref 101–111)
Creatinine, Ser: 0.69 mg/dL (ref 0.44–1.00)
GFR calc Af Amer: >60 mL/min (ref >60)
GFR calc non Af Amer: >60 mL/min (ref >60)
Glucose, Bld: 101 mg/dL — ABNORMAL HIGH (ref 65–99)
Potassium: 3.8 mmol/L (ref 3.5–5.1)
Sodium: 140 mmol/L (ref 135–145)
Total Bilirubin: 0.7 mg/dL (ref 0.3–1.2)
Total Protein: 6.6 g/dL (ref 6.5–8.1)

## 2015-04-17 LAB — SURGICAL PCR SCREEN
MRSA, PCR: NEGATIVE
Staphylococcus aureus: NEGATIVE

## 2015-04-17 LAB — PROTIME-INR
INR: 1.11 (ref 0.00–1.49)
Prothrombin Time: 14.5 seconds (ref 11.6–15.2)

## 2015-04-17 NOTE — Progress Notes (Signed)
ABNORMAL 1 VIEWE CHEST XRAY QUALITY MOBILEY XRAY 11-30-`6 ON CHART, CANNOT EXCLUDE POTENTIALLY EVOLVING BIBASILAR PNEUMONIA, WILL LORDER FOLLOW UP CHEST XRAY WITH PRE OP TODAY

## 2015-04-17 NOTE — Progress Notes (Signed)
SPOKE BY PHONE WITH PATIENTS NURSE LIZ ALEXANDER RN, ALL FAXED PRE OP INSTRUCTIONS RECEIVED AND UNDERSTODD AND CHLOROHEXADINE WASH SEND WITH INSTRCTIONS ENVELOPE WITH DRIVER.

## 2015-04-17 NOTE — Progress Notes (Signed)
DAUGHTER [name] Woodmansee PRESENT FOR PRE OP AND INSTRUCTIONS GIVE TO [name] Mandella AND CHLORHEXADINE SOAP AND INSTRUCTIONS SENT IN ENVELOPE TO [address] AND [facility]ED [facility]LIZ [facility]LEX[facility] FA[facility] [phone]

## 2015-04-21 ENCOUNTER — Inpatient Hospital Stay (HOSPITAL_COMMUNITY): Payer: Medicare Other | Admitting: Anesthesiology

## 2015-04-21 ENCOUNTER — Inpatient Hospital Stay (HOSPITAL_COMMUNITY)
Admission: RE | Admit: 2015-04-21 | Discharge: 2015-04-24 | DRG: 903 | Disposition: A | Discharge status: Skilled Nursing Facility | Payer: Medicare Other | Source: Ambulatory Visit | Attending: Orthopedic Surgery | Admitting: Orthopedic Surgery

## 2015-04-21 ENCOUNTER — Encounter (HOSPITAL_COMMUNITY): Payer: Self-pay | Admitting: *Deleted

## 2015-04-21 ENCOUNTER — Encounter (HOSPITAL_COMMUNITY)
Admission: RE | Disposition: A | Discharge status: Skilled Nursing Facility | Payer: Self-pay | Source: Ambulatory Visit | Attending: Orthopedic Surgery

## 2015-04-21 DIAGNOSIS — F0390 Unspecified dementia without behavioral disturbance: Secondary | ICD-10-CM | POA: Diagnosis present

## 2015-04-21 DIAGNOSIS — R278 Other lack of coordination: Secondary | ICD-10-CM | POA: Diagnosis not present

## 2015-04-21 DIAGNOSIS — E559 Vitamin D deficiency, unspecified: Secondary | ICD-10-CM | POA: Diagnosis present

## 2015-04-21 DIAGNOSIS — R262 Difficulty in walking, not elsewhere classified: Secondary | ICD-10-CM | POA: Diagnosis not present

## 2015-04-21 DIAGNOSIS — S83194A Other dislocation of right knee, initial encounter: Secondary | ICD-10-CM | POA: Diagnosis present

## 2015-04-21 DIAGNOSIS — K5900 Constipation, unspecified: Secondary | ICD-10-CM | POA: Diagnosis not present

## 2015-04-21 DIAGNOSIS — T8131XD Disruption of external operation (surgical) wound, not elsewhere classified, subsequent encounter: Secondary | ICD-10-CM | POA: Diagnosis not present

## 2015-04-21 DIAGNOSIS — M25561 Pain in right knee: Secondary | ICD-10-CM | POA: Diagnosis not present

## 2015-04-21 DIAGNOSIS — J329 Chronic sinusitis, unspecified: Secondary | ICD-10-CM | POA: Diagnosis not present

## 2015-04-21 DIAGNOSIS — M1990 Unspecified osteoarthritis, unspecified site: Secondary | ICD-10-CM | POA: Diagnosis present

## 2015-04-21 DIAGNOSIS — R296 Repeated falls: Secondary | ICD-10-CM | POA: Diagnosis present

## 2015-04-21 DIAGNOSIS — K5730 Diverticulosis of large intestine without perforation or abscess without bleeding: Secondary | ICD-10-CM | POA: Diagnosis not present

## 2015-04-21 DIAGNOSIS — Z9181 History of falling: Secondary | ICD-10-CM | POA: Diagnosis not present

## 2015-04-21 DIAGNOSIS — I10 Essential (primary) hypertension: Secondary | ICD-10-CM | POA: Diagnosis present

## 2015-04-21 DIAGNOSIS — E669 Obesity, unspecified: Secondary | ICD-10-CM | POA: Diagnosis present

## 2015-04-21 DIAGNOSIS — F329 Major depressive disorder, single episode, unspecified: Secondary | ICD-10-CM | POA: Diagnosis present

## 2015-04-21 DIAGNOSIS — F331 Major depressive disorder, recurrent, moderate: Secondary | ICD-10-CM

## 2015-04-21 DIAGNOSIS — K219 Gastro-esophageal reflux disease without esophagitis: Secondary | ICD-10-CM | POA: Diagnosis not present

## 2015-04-21 DIAGNOSIS — R41841 Cognitive communication deficit: Secondary | ICD-10-CM | POA: Diagnosis not present

## 2015-04-21 DIAGNOSIS — F419 Anxiety disorder, unspecified: Secondary | ICD-10-CM | POA: Diagnosis present

## 2015-04-21 DIAGNOSIS — Z809 Family history of malignant neoplasm, unspecified: Secondary | ICD-10-CM | POA: Diagnosis not present

## 2015-04-21 DIAGNOSIS — S76111D Strain of right quadriceps muscle, fascia and tendon, subsequent encounter: Secondary | ICD-10-CM | POA: Diagnosis not present

## 2015-04-21 DIAGNOSIS — K648 Other hemorrhoids: Secondary | ICD-10-CM | POA: Diagnosis not present

## 2015-04-21 DIAGNOSIS — Z823 Family history of stroke: Secondary | ICD-10-CM

## 2015-04-21 DIAGNOSIS — M6281 Muscle weakness (generalized): Secondary | ICD-10-CM | POA: Diagnosis not present

## 2015-04-21 DIAGNOSIS — M2550 Pain in unspecified joint: Secondary | ICD-10-CM | POA: Diagnosis not present

## 2015-04-21 DIAGNOSIS — S83104A Unspecified dislocation of right knee, initial encounter: Secondary | ICD-10-CM | POA: Diagnosis not present

## 2015-04-21 DIAGNOSIS — R4182 Altered mental status, unspecified: Secondary | ICD-10-CM | POA: Diagnosis not present

## 2015-04-21 DIAGNOSIS — E785 Hyperlipidemia, unspecified: Secondary | ICD-10-CM | POA: Diagnosis present

## 2015-04-21 DIAGNOSIS — H409 Unspecified glaucoma: Secondary | ICD-10-CM | POA: Diagnosis present

## 2015-04-21 DIAGNOSIS — Z79899 Other long term (current) drug therapy: Secondary | ICD-10-CM

## 2015-04-21 DIAGNOSIS — R29898 Other symptoms and signs involving the musculoskeletal system: Secondary | ICD-10-CM | POA: Diagnosis not present

## 2015-04-21 DIAGNOSIS — Z8673 Personal history of transient ischemic attack (TIA), and cerebral infarction without residual deficits: Secondary | ICD-10-CM | POA: Diagnosis not present

## 2015-04-21 DIAGNOSIS — Y838 Other surgical procedures as the cause of abnormal reaction of the patient, or of later complication, without mention of misadventure at the time of the procedure: Secondary | ICD-10-CM | POA: Diagnosis present

## 2015-04-21 DIAGNOSIS — I071 Rheumatic tricuspid insufficiency: Secondary | ICD-10-CM | POA: Diagnosis present

## 2015-04-21 DIAGNOSIS — Y92129 Unspecified place in nursing home as the place of occurrence of the external cause: Secondary | ICD-10-CM | POA: Diagnosis not present

## 2015-04-21 DIAGNOSIS — Z6829 Body mass index (BMI) 29.0-29.9, adult: Secondary | ICD-10-CM

## 2015-04-21 DIAGNOSIS — M810 Age-related osteoporosis without current pathological fracture: Secondary | ICD-10-CM | POA: Diagnosis present

## 2015-04-21 DIAGNOSIS — M66251 Spontaneous rupture of extensor tendons, right thigh: Secondary | ICD-10-CM | POA: Diagnosis not present

## 2015-04-21 DIAGNOSIS — Z96651 Presence of right artificial knee joint: Secondary | ICD-10-CM | POA: Diagnosis present

## 2015-04-21 DIAGNOSIS — W19XXXA Unspecified fall, initial encounter: Secondary | ICD-10-CM | POA: Diagnosis present

## 2015-04-21 DIAGNOSIS — M609 Myositis, unspecified: Secondary | ICD-10-CM | POA: Diagnosis present

## 2015-04-21 DIAGNOSIS — T8131XA Disruption of external operation (surgical) wound, not elsewhere classified, initial encounter: Secondary | ICD-10-CM | POA: Diagnosis present

## 2015-04-21 DIAGNOSIS — T8453XA Infection and inflammatory reaction due to internal right knee prosthesis, initial encounter: Secondary | ICD-10-CM | POA: Diagnosis not present

## 2015-04-21 DIAGNOSIS — L299 Pruritus, unspecified: Secondary | ICD-10-CM | POA: Diagnosis not present

## 2015-04-21 DIAGNOSIS — J309 Allergic rhinitis, unspecified: Secondary | ICD-10-CM | POA: Diagnosis not present

## 2015-04-21 DIAGNOSIS — R4189 Other symptoms and signs involving cognitive functions and awareness: Secondary | ICD-10-CM | POA: Diagnosis not present

## 2015-04-21 DIAGNOSIS — S71101A Unspecified open wound, right thigh, initial encounter: Secondary | ICD-10-CM | POA: Diagnosis not present

## 2015-04-21 HISTORY — PX: I&D KNEE WITH POLY EXCHANGE: SHX5024

## 2015-04-21 HISTORY — PX: OTHER SURGICAL HISTORY: SHX169

## 2015-04-21 LAB — TYPE AND SCREEN
ABO/RH(D): O POS
Antibody Screen: NEGATIVE

## 2015-04-21 SURGERY — IRRIGATION AND DEBRIDEMENT KNEE WITH POLY EXCHANGE
Anesthesia: Monitor Anesthesia Care | Site: Knee | Laterality: Right

## 2015-04-21 MED ORDER — BISACODYL 5 MG PO TBEC
5.0000 mg | DELAYED_RELEASE_TABLET | Freq: Every day | ORAL | Status: DC | PRN
  Administered 2015-04-23 – 2015-04-24 (×2): 5 mg via ORAL
  Filled 2015-04-21 (×2): qty 1

## 2015-04-21 MED ORDER — HYDROCODONE-ACETAMINOPHEN 5-325 MG PO TABS: 1.0000 | ORAL_TABLET | ORAL | Status: DC | PRN

## 2015-04-21 MED ORDER — PROPOFOL 500 MG/50ML IV EMUL
INTRAVENOUS | Status: DC | PRN
  Administered 2015-04-21: 50 ug/kg/min via INTRAVENOUS

## 2015-04-21 MED ORDER — POLYETHYLENE GLYCOL 3350 17 G PO PACK: 17.0000 g | PACK | Freq: Every day | ORAL | Status: DC | PRN

## 2015-04-21 MED ORDER — METOPROLOL TARTRATE 100 MG PO TABS
100.0000 mg | ORAL_TABLET | Freq: Two times a day (BID) | ORAL | Status: DC
  Administered 2015-04-21 – 2015-04-24 (×6): 100 mg via ORAL
  Filled 2015-04-21 (×7): qty 1

## 2015-04-21 MED ORDER — BUPIVACAINE HCL (PF) 0.25 % IJ SOLN
INTRAMUSCULAR | Status: AC
  Filled 2015-04-21: qty 30

## 2015-04-21 MED ORDER — FENTANYL CITRATE (PF) 100 MCG/2ML IJ SOLN
INTRAMUSCULAR | Status: DC | PRN
  Administered 2015-04-21 (×2): 50 ug via INTRAVENOUS

## 2015-04-21 MED ORDER — BUPIVACAINE LIPOSOME 1.3 % IJ SUSP
20.0000 mL | Freq: Once | INTRAMUSCULAR | Status: AC
  Administered 2015-04-21: 10 mL
  Filled 2015-04-21: qty 20

## 2015-04-21 MED ORDER — ALUM & MAG HYDROXIDE-SIMETH 200-200-20 MG/5ML PO SUSP
30.0000 mL | ORAL | Status: DC | PRN
Start: 2015-04-21 — End: 2015-04-24

## 2015-04-21 MED ORDER — PHENYLEPHRINE HCL 10 MG/ML IJ SOLN
10.0000 mg | INTRAVENOUS | Status: DC | PRN
  Administered 2015-04-21: 30 ug/min via INTRAVENOUS

## 2015-04-21 MED ORDER — KETOROLAC TROMETHAMINE 30 MG/ML IJ SOLN
INTRAMUSCULAR | Status: AC
  Filled 2015-04-21: qty 1

## 2015-04-21 MED ORDER — FENTANYL CITRATE (PF) 100 MCG/2ML IJ SOLN
INTRAMUSCULAR | Status: AC
  Filled 2015-04-21: qty 2

## 2015-04-21 MED ORDER — PHENYLEPHRINE 40 MCG/ML (10ML) SYRINGE FOR IV PUSH (FOR BLOOD PRESSURE SUPPORT)
PREFILLED_SYRINGE | INTRAVENOUS | Status: AC
  Filled 2015-04-21: qty 10

## 2015-04-21 MED ORDER — PROMETHAZINE HCL 25 MG/ML IJ SOLN: 6.2500 mg | INTRAMUSCULAR | Status: DC | PRN

## 2015-04-21 MED ORDER — PROPOFOL 10 MG/ML IV BOLUS
INTRAVENOUS | Status: AC
  Filled 2015-04-21: qty 40

## 2015-04-21 MED ORDER — HYDROMORPHONE HCL 1 MG/ML IJ SOLN
0.5000 mg | INTRAMUSCULAR | Status: DC | PRN
  Administered 2015-04-21 – 2015-04-22 (×2): 0.5 mg via INTRAVENOUS
  Filled 2015-04-21 (×2): qty 1

## 2015-04-21 MED ORDER — SODIUM CHLORIDE 0.9 % IJ SOLN
INTRAMUSCULAR | Status: DC | PRN
  Administered 2015-04-21: 15 mL

## 2015-04-21 MED ORDER — ONDANSETRON HCL 4 MG/2ML IJ SOLN
4.0000 mg | Freq: Four times a day (QID) | INTRAMUSCULAR | Status: DC | PRN
  Administered 2015-04-22: 4 mg via INTRAVENOUS
  Filled 2015-04-21: qty 2

## 2015-04-21 MED ORDER — FLEET ENEMA 7-19 GM/118ML RE ENEM: 1.0000 | ENEMA | Freq: Once | RECTAL | Status: DC | PRN

## 2015-04-21 MED ORDER — PHENYLEPHRINE HCL 10 MG/ML IJ SOLN
INTRAMUSCULAR | Status: DC | PRN
  Administered 2015-04-21 (×4): 80 ug via INTRAVENOUS

## 2015-04-21 MED ORDER — CEFAZOLIN SODIUM-DEXTROSE 2-3 GM-% IV SOLR
2.0000 g | INTRAVENOUS | Status: AC
  Administered 2015-04-21: 2 g via INTRAVENOUS

## 2015-04-21 MED ORDER — CEFAZOLIN SODIUM-DEXTROSE 2-3 GM-% IV SOLR
INTRAVENOUS | Status: AC
  Filled 2015-04-21: qty 50

## 2015-04-21 MED ORDER — METHOCARBAMOL 500 MG PO TABS
500.0000 mg | ORAL_TABLET | Freq: Four times a day (QID) | ORAL | Status: DC | PRN
  Administered 2015-04-22 – 2015-04-24 (×5): 500 mg via ORAL
  Filled 2015-04-21 (×5): qty 1

## 2015-04-21 MED ORDER — LACTATED RINGERS IV SOLN
INTRAVENOUS | Status: DC
  Administered 2015-04-21 (×2): via INTRAVENOUS

## 2015-04-21 MED ORDER — LOSARTAN POTASSIUM 50 MG PO TABS
100.0000 mg | ORAL_TABLET | Freq: Every day | ORAL | Status: DC
  Administered 2015-04-21 – 2015-04-24 (×3): 100 mg via ORAL
  Filled 2015-04-21 (×4): qty 2

## 2015-04-21 MED ORDER — SODIUM CHLORIDE 0.9 % IR SOLN
Status: DC | PRN
  Administered 2015-04-21: 3000 mL

## 2015-04-21 MED ORDER — BUPIVACAINE-EPINEPHRINE (PF) 0.25% -1:200000 IJ SOLN
INTRAMUSCULAR | Status: AC
  Filled 2015-04-21: qty 30

## 2015-04-21 MED ORDER — PHENOL 1.4 % MT LIQD
1.0000 | OROMUCOSAL | Status: DC | PRN
  Filled 2015-04-21: qty 177

## 2015-04-21 MED ORDER — LATANOPROST 0.005 % OP SOLN
1.0000 [drp] | Freq: Every day | OPHTHALMIC | Status: DC
  Administered 2015-04-21 – 2015-04-23 (×3): 1 [drp] via OPHTHALMIC
  Filled 2015-04-21: qty 2.5

## 2015-04-21 MED ORDER — BUPIVACAINE HCL 0.25 % IJ SOLN
INTRAMUSCULAR | Status: DC | PRN
  Administered 2015-04-21: 20 mL

## 2015-04-21 MED ORDER — ATORVASTATIN CALCIUM 20 MG PO TABS
20.0000 mg | ORAL_TABLET | Freq: Every day | ORAL | Status: DC
  Administered 2015-04-21 – 2015-04-23 (×3): 20 mg via ORAL
  Filled 2015-04-21 (×4): qty 1

## 2015-04-21 MED ORDER — FUROSEMIDE 20 MG PO TABS
20.0000 mg | ORAL_TABLET | ORAL | Status: DC
  Administered 2015-04-22 – 2015-04-24 (×2): 20 mg via ORAL
  Filled 2015-04-21 (×2): qty 1

## 2015-04-21 MED ORDER — METHOCARBAMOL 1000 MG/10ML IJ SOLN
500.0000 mg | Freq: Four times a day (QID) | INTRAVENOUS | Status: DC | PRN
  Administered 2015-04-21: 500 mg via INTRAVENOUS
  Filled 2015-04-21 (×2): qty 5

## 2015-04-21 MED ORDER — PROPOFOL 500 MG/50ML IV EMUL
INTRAVENOUS | Status: DC | PRN
  Administered 2015-04-21: 30 mg via INTRAVENOUS

## 2015-04-21 MED ORDER — SODIUM CHLORIDE 0.9 % IR SOLN
Status: AC
  Filled 2015-04-21: qty 1

## 2015-04-21 MED ORDER — FERROUS SULFATE 325 (65 FE) MG PO TABS
325.0000 mg | ORAL_TABLET | Freq: Three times a day (TID) | ORAL | Status: DC
  Administered 2015-04-22 – 2015-04-24 (×8): 325 mg via ORAL
  Filled 2015-04-21 (×11): qty 1

## 2015-04-21 MED ORDER — CEFAZOLIN SODIUM 1-5 GM-% IV SOLN
1.0000 g | Freq: Four times a day (QID) | INTRAVENOUS | Status: AC
  Administered 2015-04-21: 1 g via INTRAVENOUS
  Filled 2015-04-21 (×2): qty 50

## 2015-04-21 MED ORDER — ENSURE ENLIVE PO LIQD
237.0000 mL | Freq: Two times a day (BID) | ORAL | Status: DC
  Administered 2015-04-22 – 2015-04-24 (×3): 237 mL via ORAL

## 2015-04-21 MED ORDER — ONDANSETRON HCL 4 MG PO TABS: 4.0000 mg | ORAL_TABLET | Freq: Four times a day (QID) | ORAL | Status: DC | PRN

## 2015-04-21 MED ORDER — ACETAMINOPHEN 650 MG RE SUPP: 650.0000 mg | Freq: Four times a day (QID) | RECTAL | Status: DC | PRN

## 2015-04-21 MED ORDER — SODIUM CHLORIDE 0.9 % IJ SOLN
INTRAMUSCULAR | Status: AC
  Filled 2015-04-21: qty 50

## 2015-04-21 MED ORDER — SODIUM CHLORIDE 0.9 % IR SOLN
Status: DC | PRN
  Administered 2015-04-21: 500 mL

## 2015-04-21 MED ORDER — DULOXETINE HCL 30 MG PO CPEP
30.0000 mg | ORAL_CAPSULE | Freq: Every day | ORAL | Status: DC
  Administered 2015-04-22 – 2015-04-24 (×3): 30 mg via ORAL
  Filled 2015-04-21 (×3): qty 1

## 2015-04-21 MED ORDER — MENTHOL 3 MG MT LOZG: 1.0000 | LOZENGE | OROMUCOSAL | Status: DC | PRN

## 2015-04-21 MED ORDER — BUPIVACAINE IN DEXTROSE 0.75-8.25 % IT SOLN
INTRATHECAL | Status: DC | PRN
  Administered 2015-04-21: 2 mL via INTRATHECAL

## 2015-04-21 MED ORDER — PHENYLEPHRINE HCL 10 MG/ML IJ SOLN
INTRAMUSCULAR | Status: AC
  Filled 2015-04-21: qty 1

## 2015-04-21 MED ORDER — FENTANYL CITRATE (PF) 100 MCG/2ML IJ SOLN: 25.0000 ug | INTRAMUSCULAR | Status: DC | PRN

## 2015-04-21 MED ORDER — NUTRITIONAL DRINK PO LIQD: 90.0000 mL | Freq: Two times a day (BID) | ORAL | Status: DC

## 2015-04-21 MED ORDER — ACETAMINOPHEN 325 MG PO TABS
650.0000 mg | ORAL_TABLET | Freq: Four times a day (QID) | ORAL | Status: DC | PRN
  Administered 2015-04-22 – 2015-04-24 (×5): 650 mg via ORAL
  Filled 2015-04-21 (×6): qty 2

## 2015-04-21 MED ORDER — THROMBIN 5000 UNITS EX SOLR
CUTANEOUS | Status: AC
  Filled 2015-04-21: qty 5000

## 2015-04-21 MED ORDER — LACTATED RINGERS IV SOLN
INTRAVENOUS | Status: DC
  Administered 2015-04-21 – 2015-04-22 (×4): via INTRAVENOUS

## 2015-04-21 MED ORDER — PROPOFOL 10 MG/ML IV BOLUS
INTRAVENOUS | Status: AC
  Filled 2015-04-21: qty 20

## 2015-04-21 MED ORDER — RIVAROXABAN 10 MG PO TABS
10.0000 mg | ORAL_TABLET | Freq: Every day | ORAL | Status: DC
  Administered 2015-04-22 – 2015-04-24 (×3): 10 mg via ORAL
  Filled 2015-04-21 (×4): qty 1

## 2015-04-21 SURGICAL SUPPLY — 62 items
BAG SPEC THK2 15X12 ZIP CLS (MISCELLANEOUS)
BAG ZIPLOCK 12X15 (MISCELLANEOUS) IMPLANT
BANDAGE ACE 4X5 VEL STRL LF (GAUZE/BANDAGES/DRESSINGS) IMPLANT
BANDAGE ACE 6X5 VEL STRL LF (GAUZE/BANDAGES/DRESSINGS) IMPLANT
BANDAGE ELASTIC 4 VELCRO ST LF (GAUZE/BANDAGES/DRESSINGS) ×3 IMPLANT
BANDAGE ELASTIC 6 VELCRO ST LF (GAUZE/BANDAGES/DRESSINGS) ×3 IMPLANT
BANDAGE ESMARK 6X9 LF (GAUZE/BANDAGES/DRESSINGS) ×2 IMPLANT
BNDG CMPR 9X6 STRL LF SNTH (GAUZE/BANDAGES/DRESSINGS) ×2
BNDG ESMARK 6X9 LF (GAUZE/BANDAGES/DRESSINGS) ×4
CLOSURE WOUND 1/2 X4 (GAUZE/BANDAGES/DRESSINGS) ×2
CUFF TOURN SGL QUICK 34 (TOURNIQUET CUFF) ×4
CUFF TRNQT CYL 34X4X40X1 (TOURNIQUET CUFF) ×2 IMPLANT
DECANTER SPIKE VIAL GLASS SM (MISCELLANEOUS) ×4 IMPLANT
DRAPE EXTREMITY T 121X128X90 (DRAPE) ×4 IMPLANT
DRAPE POUCH INSTRU U-SHP 10X18 (DRAPES) ×4 IMPLANT
DRAPE U-SHAPE 47X51 STRL (DRAPES) ×4 IMPLANT
DRSG ADAPTIC 3X8 NADH LF (GAUZE/BANDAGES/DRESSINGS) ×4 IMPLANT
DRSG PAD ABDOMINAL 8X10 ST (GAUZE/BANDAGES/DRESSINGS) ×8 IMPLANT
DRSG TEGADERM 4X4.75 (GAUZE/BANDAGES/DRESSINGS) ×3 IMPLANT
DURAPREP 26ML APPLICATOR (WOUND CARE) ×4 IMPLANT
ELECT REM PT RETURN 9FT ADLT (ELECTROSURGICAL) ×4
ELECTRODE REM PT RTRN 9FT ADLT (ELECTROSURGICAL) ×2 IMPLANT
EVACUATOR 1/8 PVC DRAIN (DRAIN) ×4 IMPLANT
FACESHIELD WRAPAROUND (MASK) ×20 IMPLANT
FACESHIELD WRAPAROUND OR TEAM (MASK) ×5 IMPLANT
FLOSEAL 10ML (HEMOSTASIS) ×1 IMPLANT
GAUZE SPONGE 4X4 12PLY STRL (GAUZE/BANDAGES/DRESSINGS) ×4 IMPLANT
GLOVE BIOGEL PI IND STRL 8 (GLOVE) ×2 IMPLANT
GLOVE BIOGEL PI INDICATOR 8 (GLOVE) ×2
GLOVE ECLIPSE 8.0 STRL XLNG CF (GLOVE) IMPLANT
GOWN SPEC L3 XXLG W/TWL (GOWN DISPOSABLE) ×8 IMPLANT
GOWN STRL REUS W/TWL LRG LVL3 (GOWN DISPOSABLE) ×4 IMPLANT
HANDPIECE INTERPULSE COAX TIP (DISPOSABLE) ×4
IMMOBILIZER KNEE 20 (SOFTGOODS) ×3 IMPLANT
IMMOBILIZER KNEE 20 THIGH 36 (SOFTGOODS) IMPLANT
KIT BASIN OR (CUSTOM PROCEDURE TRAY) ×4 IMPLANT
MANIFOLD NEPTUNE II (INSTRUMENTS) ×4 IMPLANT
NS IRRIG 1000ML POUR BTL (IV SOLUTION) ×8 IMPLANT
PACK TOTAL JOINT (CUSTOM PROCEDURE TRAY) ×4 IMPLANT
PAD ABD 8X10 STRL (GAUZE/BANDAGES/DRESSINGS) ×3 IMPLANT
PADDING CAST COTTON 6X4 STRL (CAST SUPPLIES) ×5 IMPLANT
POSITIONER SURGICAL ARM (MISCELLANEOUS) ×4 IMPLANT
SET HNDPC FAN SPRY TIP SCT (DISPOSABLE) ×2 IMPLANT
SPONGE LAP 18X18 X RAY DECT (DISPOSABLE) ×4 IMPLANT
SPONGE SURGIFOAM ABS GEL 100 (HEMOSTASIS) ×3 IMPLANT
STAPLER SKIN PROX WIDE 3.9 (STAPLE) ×4 IMPLANT
STRIP CLOSURE SKIN 1/2X4 (GAUZE/BANDAGES/DRESSINGS) ×6 IMPLANT
SUCTION FRAZIER 12FR DISP (SUCTIONS) ×4 IMPLANT
SUT ETHILON 2 0 PS N (SUTURE) ×12 IMPLANT
SUT VIC AB 1 CT1 27 (SUTURE) ×4
SUT VIC AB 1 CT1 27XBRD ANTBC (SUTURE) ×1 IMPLANT
SUT VIC AB 1 CT1 36 (SUTURE) ×12 IMPLANT
SUT VIC AB 2-0 CT1 27 (SUTURE) ×12
SUT VIC AB 2-0 CT1 TAPERPNT 27 (SUTURE) ×6 IMPLANT
SWAB COLLECTION DEVICE MRSA (MISCELLANEOUS) ×4 IMPLANT
SWAB CULTURE ESWAB REG 1ML (MISCELLANEOUS) ×4 IMPLANT
SYR CONTROL 10ML LL (SYRINGE) IMPLANT
TOWEL OR 17X26 10 PK STRL BLUE (TOWEL DISPOSABLE) ×8 IMPLANT
TRAY FOLEY W/METER SILVER 14FR (SET/KITS/TRAYS/PACK) ×4 IMPLANT
TRAY FOLEY W/METER SILVER 16FR (SET/KITS/TRAYS/PACK) ×1 IMPLANT
WATER STERILE IRR 1500ML POUR (IV SOLUTION) ×4 IMPLANT
WRAP KNEE MAXI GEL POST OP (GAUZE/BANDAGES/DRESSINGS) ×4 IMPLANT

## 2015-04-21 NOTE — Interval H&P Note (Signed)
History and Physical Interval Note:  04/21/2015 12:50 PM  [name]  has presented today for surgery, with the diagnosis of right total knee disruption  The various methods of treatment have been discussed with the patient and family. After consideration of risks, benefits and other options for treatment, the patient has consented to  Procedure(s): RIGHT TOTAL KNEE ARTHROPLASTY WITH REVISION COMPONENTS (Right) IRRIGATION AND DEBRIDEMENT RIGHT KNEE WITH POLY EXCHANGE (Right) as a surgical intervention .  The patient's history has been reviewed, patient examined, no change in status, stable for surgery.  I have reviewed the patient's chart and labs.  Questions were answered to the patient's satisfaction.     Kristin Wilkerson A

## 2015-04-21 NOTE — Anesthesia Postprocedure Evaluation (Signed)
Anesthesia Post Note  Patient: Kristin Wilkerson  Procedure(s) Performed: Procedure(s) (LRB): IRRIGATION AND DEBRIDEMENT RIGHT KNEE WITH PATELLECTOMY AND REPAIR OF COMPLEX OPEN WOUND (Right)  Patient location during evaluation: PACU Anesthesia Type: Spinal and MAC Level of consciousness: awake and alert Pain management: pain level controlled Vital Signs Assessment: post-procedure vital signs reviewed and stable Respiratory status: spontaneous breathing Cardiovascular status: blood pressure returned to baseline Postop Assessment: spinal receding Anesthetic complications: no    Last Vitals:  Filed Vitals:   04/21/15 1445 04/21/15 1600  BP: 117/70 180/92  Pulse: 75   Temp:    Resp: 20     Last Pain:  Filed Vitals:   04/21/15 1605  PainSc: 0-No pain                 Kennieth RadFitzgerald, Kailana Benninger E

## 2015-04-21 NOTE — Anesthesia Procedure Notes (Signed)
Spinal Patient location during procedure: OR Staffing Anesthesiologist: ,  Performed by: anesthesiologist  Preanesthetic Checklist Completed: patient identified, site marked, surgical consent, pre-op evaluation, timeout performed, IV checked, risks and benefits discussed and monitors and equipment checked Spinal Block Patient position: sitting Prep: Betadine Patient monitoring: heart rate, continuous pulse ox and blood pressure Approach: right paramedian Location: L4-5 Injection technique: single-shot Needle Needle type: Quincke  Needle gauge: 22 G Needle length: 9 cm Additional Notes Expiration date of kit checked and confirmed. Patient tolerated procedure well, without complications.     

## 2015-04-21 NOTE — Op Note (Signed)
NAMGabriela Wilkerson:  Frank, Srishti                  ACCOUNT NO.:  [id number]  MEDICAL RECORD NO.:  [id number]  LOCATION:  1601                         [facility]  PHYSICIAN:  Georges Lynchonald A. Brynlie Daza, M.D.DATE OF BIRTH:  1926/01/20  DATE OF PROCEDURE:  04/21/2015 DATE OF DISCHARGE:                              OPERATIVE REPORT   SURGEON:  Georges Lynchonald A. [name], M.D.  ASSISTANT:  Marlowe KaysJames Aplington, M.D.  PREOPERATIVE DIAGNOSES: 1. A complete traumatic disruption of her quadriceps mechanism     secondary to multiple falls, right total knee. 2. Wound dehiscence secondary to her multiple falls.  POSTOPERATIVE DIAGNOSES: 1. A complete traumatic disruption of her quadriceps mechanism     secondary to multiple falls, right total knee. 2. Wound dehiscence secondary to her multiple falls.  OPERATION: 1. Wound cultures were taken at the time of surgery.  Also, wound     cultures were taken in the office approximately 5 days prior to     surgery.  No wound cultures were negative for infection. 2. Debridement and synovectomy of an open total knee wound on the     right. 3. Patellectomy on the right. 4. Wound closure of a complex wound of the right knee over Hemovac     drain.  DESCRIPTION OF PROCEDURE:  Under general anesthesia, a routine orthopedic prep was carried out first followed by a sterile prep of the right knee.  At this time, the leg was exsanguinated.  Esmarch tourniquet was elevated at 325 mmHg.  Note, the incision was extended proximally and distally and literally her patella was exposed with good granulation tissue on the surface.  It was completely disrupted.  She had a complete soft tissue disruption within the knee joint per Se.  I elected, at this time, since she is nonambulatory and severely demented that we just do a patellectomy on her.  There was really nothing there left to repair.  Once this was done, I did a thorough inspection of the knee and debridement.  There were absolutely no  signs of any infection or purulent material present.  I did a simple synovectomy on her.  I then thoroughly did a pulse irrigation of her wound with 3000 mL of fluid.  Note, cultures, as I mentioned, were taken and sent for aerobic, anaerobic, and Gram stain despite the fact that I did cultures in the office.  She was also given 1 g of IV Ancef in surgery.  After the thorough debridement, I inserted a Hemovac drain and reapproximated the wound.  Sterile dressings were applied.  She will be maintained on the appropriate anticoagulation protocol.          ______________________________ Georges Lynchonald A. [name], M.D.     RAG/MEDQ  D:  04/21/2015  T:  04/21/2015  Job:  [id number]

## 2015-04-21 NOTE — Transfer of Care (Signed)
Immediate Anesthesia Transfer of Care Note  Patient: Kristin Wilkerson  Procedure(s) Performed: Procedure(s): IRRIGATION AND DEBRIDEMENT RIGHT KNEE WITH PATELLECTOMY AND REPAIR OF COMPLEX OPEN WOUND (Right)  Patient Location: PACU  Anesthesia Type:MAC and Spinal  Level of Consciousness: sedated, patient cooperative and responds to stimulation  Airway & Oxygen Therapy: Patient Spontanous Breathing and Patient connected to face mask oxygen  Post-op Assessment: Report given to RN and Post -op Vital signs reviewed and stable  Post vital signs: Reviewed and stable  Last Vitals:  Filed Vitals:   04/21/15 1118  BP: 96/67  Pulse: 84  Temp: 36.6 C  Resp: 18    Complications: No apparent anesthesia complications

## 2015-04-21 NOTE — Anesthesia Preprocedure Evaluation (Addendum)
Anesthesia Evaluation  Patient identified by MRN, date of birth, ID band Patient awake    Reviewed: Allergy & Precautions, NPO status , Patient's Chart, lab work & pertinent test results  Airway Mallampati: II  TM Distance: >3 FB Neck ROM: full    Dental   Pulmonary    breath sounds clear to auscultation       Cardiovascular hypertension,  Rhythm:regular Rate:Normal  01/2015 Echo. Nl LV function. Mild MR   Neuro/Psych PSYCHIATRIC DISORDERS Anxiety Depression  Neuromuscular disease CVA, Residual Symptoms    GI/Hepatic negative GI ROS, Neg liver ROS,   Endo/Other  obese  Renal/GU negative Renal ROS     Musculoskeletal  (+) Arthritis ,   Abdominal   Peds  Hematology negative hematology ROS (+) anemia ,   Anesthesia Other Findings   Reproductive/Obstetrics                            Lab Results  Component Value Date   WBC 8.3 04/17/2015   HGB 10.4* 04/17/2015   HCT 32.7* 04/17/2015   MCV 90.3 04/17/2015   PLT 476* 04/17/2015   Lab Results  Component Value Date   CREATININE 0.69 04/17/2015   BUN 18 04/17/2015   NA 140 04/17/2015   K 3.8 04/17/2015   CL 104 04/17/2015   CO2 27 04/17/2015   Lab Results  Component Value Date   INR 1.11 04/17/2015   INR 1.15 03/02/2015   INR 0.96 02/03/2015    Anesthesia Physical  Anesthesia Plan  ASA: III  Anesthesia Plan: MAC and Spinal   Post-op Pain Management:    Induction: Intravenous  Airway Management Planned: Simple Face Mask and Natural Airway  Additional Equipment:   Intra-op Plan:   Post-operative Plan: Extubation in OR  Informed Consent: I have reviewed the patients History and Physical, chart, labs and discussed the procedure including the risks, benefits and alternatives for the proposed anesthesia with the patient or authorized representative who has indicated his/her understanding and acceptance.     Plan Discussed  with: CRNA, Anesthesiologist and Surgeon  Anesthesia Plan Comments:        Anesthesia Quick Evaluation

## 2015-04-21 NOTE — Brief Op Note (Signed)
04/21/2015  2:33 PM  PATIENT:  Kristin Wilkerson  82 y.o. female  PRE-OPERATIVE DIAGNOSIS:  right total knee Wound  disruption and Traumatic Rupture of Quadriceps Tendon  POST-OPERATIVE DIAGNOSIS:  TRAUMATIC QUADRICEPS TENDON DISRUPTION WITH COMPLEX OPEN WOUND  PROCEDURE:  Procedure(s): IRRIGATION AND DEBRIDEMENT RIGHT KNEE WITH PATELLECTOMY AND REPAIR OF COMPLEX OPEN WOUND (Right)   Size was 11cm long;4cm wide and 5cm deep.Wound cultures were sent.  SURGEON:  Surgeon(s) and Role:    * Ranee Gosselinonald Saurabh Hettich, MD - Primary    * Drucilla SchmidtJames P Aplington, MD - Assisting    ASSISTANTS: Marlowe KaysJames Aplington MD  ANESTHESIA:   spinal  EBL:  Total I/O In: 1000 [I.V.:1000] Out: 150 [Urine:100; Blood:50]  BLOOD ADMINISTERED:none  DRAINS: (One) Hemovact drain(s) in the Right Knee with  Suction Open   LOCAL MEDICATIONS USED:  MARCAINE 20cc of 0.25%Plain and 20cc of Exparel.    SPECIMEN:  Source of Specimen:  Cultures of Right Knee  DISPOSITION OF SPECIMEN:  PATHOLOGY  COUNTS:  YES  TOURNIQUET:   Total Tourniquet Time Documented: Thigh (Right) - 16 minutes Total: Thigh (Right) - 16 minutes   DICTATION: .Other Dictation: Dictation Number [id number]  PLAN OF CARE: Admit to inpatient   PATIENT DISPOSITION:  PACU - hemodynamically stable.   Delay start of Pharmacological VTE agent (>24hrs) due to surgical blood loss or risk of bleeding: yes

## 2015-04-21 NOTE — H&P (Signed)
Kristin Wilkerson is an 82 y.o. female.   Chief Complaint: Wound Deheiscence right knee HPI: She had a successful Right Total Knee Arthroplasty several months ago and has fallen several times secondary to her Dementia and as a result sustained a wound deheiscence.  Past Medical History  Diagnosis Date  . Hypertension   . Abnormality of gait   . Disturbance of skin sensation   . Tachycardia, unspecified   . Internal hemorrhoids without mention of complication   . Synovial cyst of popliteal space   . Spinal stenosis, other region   . Sciatica   . Lumbago   . Unspecified vitamin D deficiency   . Anxiety   . Hyperlipidemia   . Abnormality of gait   . Unspecified sinusitis (chronic)   . Unspecified pruritic disorder   . Pain in joint, lower leg   . Depressive disorder, not elsewhere classified   . Unspecified glaucoma   . Reflux esophagitis   . Diverticulosis of colon (without mention of hemorrhage)   . Pain in joint, site unspecified   . Myalgia and myositis, unspecified   . Osteoporosis, unspecified   . Other malaise and fatigue   . Cervicalgia   . Benign neoplasm of colon   . H/O echocardiogram 09/01/11    EF >55%, mild MR, RV systolic pressure elevated at 14-78 mmHg, mod TR  . Normal cardiac stress test 11/16/09    low risk scan, EF  77%  . Shortness of breath   . Stroke [facility]) "a couple of years ago"     visual problems left eye  . Arthritis   . Swelling of both ankles     rt leg  . Bruises easily   . Complication of anesthesia     SLOW TO AWAKEN MORE CONFUSION AFTER LAST SURGERY.WANTS SPINAL OR NERVE BLOCK IF POSSIBLE    Past Surgical History  Procedure Laterality Date  . Lumbar laminectomy  12/04/2012    Complete decompressive L3-4 and L4-5--Dr. Darrelyn Hillock  . Abdominal hysterectomy    . Cholecystectomy    . Cardiovascular stress test  11/16/2009    Perfusion defect seen in inferior myocardial region consistent with diaphragmatic attenuation. Remaining myocardium demonstrates  normalmyocardial perfusion with no evidence of ischemia or infarct. No ECG changes. EKG negative for ischemia.  . Transthoracic echocardiogram  09/01/2011    EF >55% and moderate tricuspid valve regurg.  . Bilateral oophorectomy    . Total knee arthroplasty Right 02/11/2015    Procedure: TOTAL KNEE ARTHROPLASTY;  Surgeon: Ranee Gosselin, MD;  Location: [facility];  Service: Orthopedics;  Laterality: Right;    Family History  Problem Relation Age of Onset  . Cancer Sister   . Stroke Mother   . Stroke Father    Social History:  reports that she has never smoked. She has never used smokeless tobacco. She reports that she does not drink alcohol or use illicit drugs.  Allergies: No Known Allergies  Medications Prior to Admission  Medication Sig Dispense Refill  . acetaminophen (TYLENOL) 500 MG tablet Take 1,000 mg by mouth 3 (three) times daily.    [name] atorvastatin (LIPITOR) 20 MG tablet TAKE 1 TABLET BY MOUTH EVERY EVENING TO CONTROL CHOLESTEROL 90 tablet 1  . Calcium Carbonate-Vitamin D (CALCIUM 600+D) 600-400 MG-UNIT per tablet Take one tablet by mouth daily for calcium supplement (Patient taking differently: Take 1 tablet by mouth daily. ) 30 tablet 5  . cetirizine (ZYRTEC) 10 MG tablet Take 10 mg by mouth daily.  11  . cholecalciferol (VITAMIN D) 1000 UNITS tablet Take 1,000 Units by mouth daily at 8 pm.     . doxycycline (VIBRA-TABS) 100 MG tablet Take 1 tablet (100 mg total) by mouth 2 (two) times daily. (Patient taking differently: Take 100 mg by mouth daily. Scheduled) 20 tablet 0  . DULoxetine (CYMBALTA) 30 MG capsule One daily to help nerves (Patient taking differently: Take 30 mg by mouth daily. One daily to help nerves) 30 capsule 3  . furosemide (LASIX) 20 MG tablet Take one every other day for diuretic (Patient taking differently: Take 20 mg by mouth every other day. ) 30 tablet 5  . hydrocortisone (ANUSOL-HC) 2.5 % rectal cream Place 1 application rectally 2 (two) times daily as  needed for hemorrhoids or itching.    . latanoprost (XALATAN) 0.005 % ophthalmic solution PLACE 1 DROP IN BOTH EYES AT BEDTIME TO TREAT GLAUCOMA 7.5 mL 2  . losartan (COZAAR) 100 MG tablet TAKE 1 TABLET BY MOUTH ONCE DAILY FOR BLOOD PRESSURE 90 tablet 1  . metoprolol (LOPRESSOR) 100 MG tablet TAKE 1 TABLET BY MOUTH TWICE DAILY TO CONTORL BLOOD PRESSURE 180 tablet 1  . Nutritional Supplements (NUTRITIONAL DRINK) LIQD Take 90 mLs by mouth 2 (two) times daily. *Medpass*    . polyethylene glycol (MIRALAX / GLYCOLAX) packet Take 17 g by mouth 2 (two) times daily.    . Protein (PROCEL 100) POWD Take 2 scoop by mouth 2 (two) times daily.    . sennosides-docusate sodium (SENOKOT-S) 8.6-50 MG tablet Take 2 tablets by mouth 2 (two) times daily.      No results found for this or any previous visit (from the past 48 hour(s)). No results found.  Review of Systems  Constitutional: Negative.   HENT: Negative.   Eyes: Negative.   Respiratory: Negative.   Cardiovascular: Negative.   Gastrointestinal: Negative.   Genitourinary: Negative.   Musculoskeletal: Positive for joint pain and falls.  Skin:       Wound deheiscence Right Knee  Neurological:       Dementia  Psychiatric/Behavioral: Positive for memory loss.    Blood pressure 96/67, pulse 84, temperature 97.8 F (36.6 C), temperature source Oral, resp. rate 18, height 5\' 1"  (1.549 m), weight 69.911 kg (154 lb 2 oz), SpO2 96 %. Physical Exam  Constitutional: She appears distressed.  HENT:  Head: Normocephalic.  Eyes: Pupils are equal, round, and reactive to light.  Neck: Normal range of motion.  Cardiovascular: Normal rate.   Respiratory: Effort normal.  GI: Soft.  Musculoskeletal:  Right Total Knee wound breakdown secondary to multiple falls.  Neurological:  Sever Dementia.  Skin:  Right Knee wound breakdown  Psychiatric:  Severe Dementia.     Assessment/Plan Debridement of Right Total Knee wound and evaluation of her Quadriceps  tendon and Tibial Polyethylene insert.  Ai Sonnenfeld A 04/21/2015, 12:42 PM

## 2015-04-22 LAB — CBC
HCT: 27.9 % — ABNORMAL LOW (ref 36.0–46.0)
Hemoglobin: 8.8 g/dL — ABNORMAL LOW (ref 12.0–15.0)
MCH: 28.8 pg (ref 26.0–34.0)
MCHC: 31.5 g/dL (ref 30.0–36.0)
MCV: 91.2 fL (ref 78.0–100.0)
PLATELETS: 393 10*3/uL (ref 150–400)
RBC: 3.06 MIL/uL — ABNORMAL LOW (ref 3.87–5.11)
RDW: 16.7 % — AB (ref 11.5–15.5)
WBC: 7.6 10*3/uL (ref 4.0–10.5)

## 2015-04-22 LAB — BASIC METABOLIC PANEL
Anion gap: 9 (ref 5–15)
BUN: 12 mg/dL (ref 6–20)
CALCIUM: 8.6 mg/dL — AB (ref 8.9–10.3)
CO2: 26 mmol/L (ref 22–32)
CREATININE: 0.78 mg/dL (ref 0.44–1.00)
Chloride: 106 mmol/L (ref 101–111)
GFR calc Af Amer: >60 mL/min (ref >60)
GLUCOSE: 99 mg/dL (ref 65–99)
Potassium: 3.9 mmol/L (ref 3.5–5.1)
SODIUM: 141 mmol/L (ref 135–145)

## 2015-04-22 MED ORDER — TRAMADOL HCL 50 MG PO TABS
50.0000 mg | ORAL_TABLET | Freq: Four times a day (QID) | ORAL | Status: DC | PRN
  Administered 2015-04-22 – 2015-04-24 (×2): 50 mg via ORAL
  Filled 2015-04-22 (×2): qty 1

## 2015-04-22 NOTE — Discharge Instructions (Addendum)
Do not remove knee immobilizer Do not change dressing. It will be changed on Monday 04/27/2015 in the office Bed to chair only Discontinue foley catheter Saturday 04/25/2015 Call if any temperatures greater than 101 or any wound complications: [phone] during the day and ask for Dr. Jeannetta EllisGioffre's nurse, Mackey Birchwoodammy Johnson.

## 2015-04-22 NOTE — Care Management Note (Addendum)
Case Management Note  Patient Details  Name: Carey Bullocksnn T Blackwelder MRN: [id number] Date of Birth: 1926/01/17  Subjective/Objective:  82 y.o. F who was admitted with TRAUMATIC QUADRICEPS TENDON DISRUPTION WITH COMPLEX OPEN WOUND who underwent I/D R knee Patellar Repair on 04/21/2015. MD notes indicates pt will be for SNF placement. Will alert CSW and await PT evaluation.                   Action/Plan: CM will continue to follow.    Expected Discharge Date:  04/24/15               Expected Discharge Plan:  Skilled Nursing Facility  In-House Referral:  Clinical Social Work  Discharge planning Services  CM Consult  Post Acute Care Choice:    Choice offered to:     DME Arranged:    DME Agency:     HH Arranged:    HH Agency:     Status of Service:  In process, will continue to follow  Medicare Important Message Given:    Date Medicare IM Given:    Medicare IM give by:    Date Additional Medicare IM Given:    Additional Medicare Important Message give by:     If discussed at Long Length of Stay Meetings, dates discussed:    Additional Comments:  Yvone NeuCrutchfield, Yerania Chamorro M, RN 04/22/2015, 8:55 AM

## 2015-04-22 NOTE — Clinical Social Work Note (Signed)
Clinical Social Work Assessment  Patient Details  Name: Kristin Wilkerson T Aydelotte MRN: [id number] Date of Birth: 20-Jun-1925  Date of referral:  04/22/15               Reason for consult:  Discharge Planning, Facility Placement                Permission sought to share information with:  Facility Industrial/product designerContact Representative Permission granted to share information::  Yes, Verbal Permission Granted  Name::        Agency::     Relationship::     Contact Information:     Housing/Transportation Living arrangements for the past 2 months:  Skilled Nursing Facility Source of Information:  Adult Children Patient Interpreter Needed:  None Criminal Activity/Legal Involvement Pertinent to Current Situation/Hospitalization:  No - Comment as needed Significant Relationships:  Adult Children Lives with:    Do you feel safe going back to the place where you live?  No (Family requesting new placement.) Need for family participation in patient care:     Care giving concerns: [facility]   Social Worker assessment / plan:  Pt hospitalized on 04/21/15 for pre planned irrigation and debridement of ( R ) knee / repair of complex open wound. Pt is confused and anxious / weeping. CSW offered  support and reassurance. CSW contacted pt's daughter, Eber JonesCarolyn, to assist with d/c planning. Daughter has requested new SNF placement. CSW has initiated SNF search and will contact daughter when bed offers are available.  Employment status:  Retired Health and safety inspectornsurance information:  Medicare PT Recommendations:  Not assessed at this time Information / Referral to community resources:     Patient/Family's Response to care:  Family feels new placement is needed.  Patient/Family's Understanding of and Emotional Response to Diagnosis, Current Treatment, and Prognosis:  Family is aware of pt's medical status. D[facility]. " [facility] so long to  get assistance to the bathroom. My mother just got up on her own. I was concerned and wanted my mother tested for pneumonia and it took 3 days to get a response from nsg. " [facility].   Emotional Assessment Appearance:  Appears stated age Attitude/Demeanor/Rapport:  Crying Affect (typically observed):  Anxious Orientation:  Oriented to Self Alcohol / Substance use:  Not Applicable Psych involvement (Current and /or in the community):  No (Comment)  Discharge Needs  Concerns to be addressed:  Discharge Planning Concerns Readmission within the last 30 days:  No Current discharge risk:  None Barriers to Discharge:  No Barriers Identified   Vennie HomansHaidinger, Shaquoya Cosper Lee, LCSW  [phone] 04/22/2015, 9:41 AM

## 2015-04-22 NOTE — Plan of Care (Signed)
Problem: Health Behavior/Discharge Planning: Goal: Ability to manage health-related needs will improve Outcome: Not Met (add Reason) Dementia

## 2015-04-22 NOTE — [place] FL2 (Signed)
[place] MEDICAID FL2 LEVEL OF CARE SCREENING TOOL     IDENTIFICATION  Patient Name: Kristin Wilkerson Birthdate: Nov 07, 1925 Sex: female Admission Date (Current Location): 04/21/2015  [facility] and [place] Number:  Producer, television/film/video and Address:  [facility],  [address] [place], [place] [address]      Provider Number: [id number]  Attending Physician Name and Address:  Ranee Gosselin, MD  Relative Name and Phone Number:       Current Level of Care: Hospital Recommended Level of Care: Skilled Nursing Facility Prior Approval Number:    Date Approved/Denied:   PASRR Number: [id number]  Discharge Plan: SNF    Current Diagnoses: Patient Active Problem List   Diagnosis Date Noted  . Quadriceps muscle rupture 04/21/2015  . Constipation 04/06/2015  . Subacute confusional state 04/06/2015  . Postoperative wound dehiscence 03/01/2015  . History of total knee arthroplasty 02/11/2015  . 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 Monilial vulvovaginitis 09/24/2014  . Cough 09/24/2014  . Tracheobronchitis 08/06/2014  . Deaf 07/02/2014  . Right knee pain 07/02/2014  . History of shingles 2015 02/14/2014  . Depression, major, recurrent, moderate (HCC) 01/07/2014  . Grief 11/27/2013  . Left arm pain 11/06/2013  . Corn of foot 10/23/2013  . Leg edema 09/03/2013  . Insomnia 07/02/2013  . Diastolic CHF (HCC) 03/02/2013  . Pulmonary hypertension (HCC) 03/02/2013  . History of CVA (cerebrovascular accident) 03/01/2013  . HTN (hypertension) 03/01/2013  . HLD (hyperlipidemia) 03/01/2013  . Tachycardia 12/10/2012  . Anxiety 12/10/2012  . Myalgia and myositis, unspecified 09/18/2012  . Other malaise and fatigue 09/18/2012  . Backache 03/31/2008    Orientation RESPIRATION BLADDER Height & Weight    Self  Normal Continent 5\' 1"  (154.9 cm) 154 lbs.  BEHAVIORAL SYMPTOMS/MOOD NEUROLOGICAL BOWEL NUTRITION STATUS  Other (Comment) (no behaviors)   Continent Diet  AMBULATORY STATUS  COMMUNICATION OF NEEDS Skin   Limited Assist Verbally Surgical wounds                       Personal Care Assistance Level of Assistance  Bathing, Feeding, Dressing Bathing Assistance: Limited assistance Feeding assistance: Independent Dressing Assistance: Limited assistance     Functional Limitations Info  Sight, Hearing, Speech Sight Info: Adequate Hearing Info: Adequate Speech Info: Adequate    SPECIAL CARE FACTORS FREQUENCY  PT (By licensed PT), OT (By licensed OT)     PT Frequency: 5 x wk OT Frequency: 5 x wk            Contractures Contractures Info: Not present    Additional Factors Info  Code Status Code Status Info: DNR             Current Medications (04/22/2015):  This is the current hospital active medication list Current Facility-Administered Medications  Medication Dose Route Frequency Provider Last Rate Last Dose  . acetaminophen (TYLENOL) tablet 650 mg  650 mg Oral Q6H PRN Ranee Gosselin, MD   650 mg at 04/22/15 3086   Or  . acetaminophen (TYLENOL) suppository 650 mg  650 mg Rectal Q6H PRN Ranee Gosselin, MD      . alum & mag hydroxide-simeth (MAALOX/MYLANTA) 200-200-20 MG/5ML suspension 30 mL  30 mL Oral Q4H PRN Ranee Gosselin, MD      . atorvastatin (LIPITOR) tablet 20 mg  20 mg Oral q1800 Ranee Gosselin, MD   20 mg at 04/21/15 1744  . bisacodyl (DULCOLAX) EC tablet 5 mg  5 mg Oral Daily PRN Ranee Gosselin,  MD      . DULoxetine (CYMBALTA) DR capsule 30 mg  30 mg Oral Daily Ranee Gosselinonald Gioffre, MD      . feeding supplement (ENSURE ENLIVE) (ENSURE ENLIVE) liquid 237 mL  237 mL Oral BID BM Ranee Gosselinonald Gioffre, MD   237 mL at 04/21/15 1748  . ferrous sulfate tablet 325 mg  325 mg Oral TID PC Ranee Gosselinonald Gioffre, MD   325 mg at 04/21/15 1749  . furosemide (LASIX) tablet 20 mg  20 mg Oral QODAY Ranee Gosselinonald Gioffre, MD      . HYDROcodone-acetaminophen (NORCO/VICODIN) 5-325 MG per tablet 1-2 tablet  1-2 tablet Oral Q4H PRN Ranee Gosselinonald Gioffre, MD      . HYDROmorphone  (DILAUDID) injection 0.5 mg  0.5 mg Intravenous Q2H PRN Ranee Gosselinonald Gioffre, MD   0.5 mg at 04/22/15 [id number]  . lactated ringers infusion   Intravenous Continuous Ranee Gosselinonald Gioffre, MD 100 mL/hr at 04/22/15 0008    . latanoprost (XALATAN) 0.005 % ophthalmic solution 1 drop  1 drop Both Eyes QHS Ranee Gosselinonald Gioffre, MD   1 drop at 04/21/15 2226  . losartan (COZAAR) tablet 100 mg  100 mg Oral Daily Ranee Gosselinonald Gioffre, MD   100 mg at 04/21/15 1744  . menthol-cetylpyridinium (CEPACOL) lozenge 3 mg  1 lozenge Oral PRN Ranee Gosselinonald Gioffre, MD       Or  . phenol (CHLORASEPTIC) mouth spray 1 spray  1 spray Mouth/Throat PRN Ranee Gosselinonald Gioffre, MD      . methocarbamol (ROBAXIN) tablet 500 mg  500 mg Oral Q6H PRN Ranee Gosselinonald Gioffre, MD       Or  . methocarbamol (ROBAXIN) 500 mg in dextrose 5 % 50 mL IVPB  500 mg Intravenous Q6H PRN Ranee Gosselinonald Gioffre, MD   500 mg at 04/21/15 1718  . metoprolol (LOPRESSOR) tablet 100 mg  100 mg Oral BID Ranee Gosselinonald Gioffre, MD   100 mg at 04/21/15 2226  . ondansetron (ZOFRAN) tablet 4 mg  4 mg Oral Q6H PRN Ranee Gosselinonald Gioffre, MD       Or  . ondansetron [facility](ZOFRAN) injection 4 mg  4 mg Intravenous Q6H PRN Ranee Gosselinonald Gioffre, MD   4 mg at 04/22/15 0844  . polyethylene glycol (MIRALAX / GLYCOLAX) packet 17 g  17 g Oral Daily PRN Ranee Gosselinonald Gioffre, MD      . rivaroxaban Carlena Hurl(XARELTO) tablet 10 mg  10 mg Oral Q breakfast Ranee Gosselinonald Gioffre, MD   10 mg at 04/22/15 0835  . sodium phosphate (FLEET) 7-19 GM/118ML enema 1 enema  1 enema Rectal Once PRN Ranee Gosselinonald Gioffre, MD         Discharge Medications: Please see discharge summary for a list of discharge medications.  Relevant Imaging Results:  Relevant Lab Results:   Additional Information SS # [id number]  Lynsay Fesperman, Dickey GaveJamie Lee, LCSW

## 2015-04-22 NOTE — Evaluation (Signed)
Physical Therapy Evaluation Patient Details Name: Kristin Wilkerson MRN: [id number] DOB: [birth year] Today's Date: 04/22/2015   History of Present Illness  Kristin Wilkerson 82 y.o. female who presents with right total knee wounddisruption and Traumatic Rupture of Quadriceps Tendon. Patient is s/p IRRIGATION AND DEBRIDEMENT RIGHT KNEE WITH PATELLECTOMY AND REPAIR OF COMPLEX OPEN WOUND.   Clinical Impression  Pt admitted as above and presenting with functional mobility limitations 2* decreased R LE strength/ROM, post op pain, PWB status on R, and cognitive deficits.  Pt would benefit from follow up rehab at SNF level.    Follow Up Recommendations SNF    Equipment Recommendations       Recommendations for Other Services OT consult     Precautions / Restrictions Precautions Precautions: Fall Required Braces or Orthoses: Knee Immobilizer - Right Knee Immobilizer - Right: On at all times Restrictions Weight Bearing Restrictions: Yes RLE Weight Bearing: Partial weight bearing      Mobility  Bed Mobility Overal bed mobility: Needs Assistance;+2 for physical assistance;+ 2 for safety/equipment Bed Mobility: Supine to Sit     Supine to sit: Mod assist;HOB elevated;+2 for safety/equipment     General bed mobility comments: Assistance for BLE management and some assist for trunk control. Bed pad used for positioning to EOB.  Transfers Overall transfer level: Needs assistance Equipment used: Rolling walker (2 wheeled) Transfers: Sit to/from Stand Sit to Stand: Mod assist;+2 safety/equipment;From elevated surface         General transfer comment: Multimodal cueing for safety and sequencing  Ambulation/Gait Ambulation/Gait assistance: Mod assist;+2 physical assistance;+2 safety/equipment Ambulation Distance (Feet): 6 Feet Assistive device: Rolling walker (2 wheeled) Gait Pattern/deviations: Step-to pattern;Decreased step length - right;Decreased step length - left;Shuffle;Trunk  flexed Gait velocity: decr   General Gait Details: step-by-step cues for sequence, posture, foot placement, and increased UE WB  Stairs            Wheelchair Mobility    Modified Rankin (Stroke Patients Only)       Balance Overall balance assessment: Needs assistance Sitting-balance support: No upper extremity supported;Feet supported Sitting balance-Leahy Scale: Fair     Standing balance support: Bilateral upper extremity supported Standing balance-Leahy Scale: Poor                               Pertinent Vitals/Pain Pain Assessment: Faces Faces Pain Scale: No hurt Pain Intervention(s): Limited activity within patient's tolerance;Monitored during session;Premedicated before session;Ice applied    Home Living Family/patient expects to be discharged to:: Skilled nursing facility                 Additional Comments: Pt from SNF, going back to SNF.     Prior Function Level of Independence: Independent;Independent with assistive device(s)         Comments: Unsure, pt unable to state due to poor cognition     Hand Dominance   Dominant Hand: Right    Extremity/Trunk Assessment   Upper Extremity Assessment: Generalized weakness           Lower Extremity Assessment: Generalized weakness;RLE deficits/detail RLE Deficits / Details: R LE with KI in place    Cervical / Trunk Assessment: Kyphotic  Communication   Communication: HOH  Cognition Arousal/Alertness: Awake/alert Behavior During Therapy: WFL for tasks assessed/performed Overall Cognitive Status: History of cognitive impairments - at baseline  General Comments      Exercises        Assessment/Plan    PT Assessment Patient needs continued PT services  PT Diagnosis Difficulty walking   PT Problem List Decreased strength;Decreased range of motion;Decreased activity tolerance;Decreased mobility;Decreased knowledge of use of DME;Pain;Decreased  safety awareness;Decreased cognition;Decreased balance  PT Treatment Interventions DME instruction;Gait training;Stair training;Functional mobility training;Therapeutic activities;Therapeutic exercise;Patient/family education   PT Goals (Current goals can be found in the Care Plan section) Acute Rehab PT Goals Patient Stated Goal: none stated PT Goal Formulation: Patient unable to participate in goal setting Time For Goal Achievement: 04/27/15 Potential to Achieve Goals: Fair    Frequency Min 3X/week   Barriers to discharge        Co-evaluation PT/OT/SLP Co-Evaluation/Treatment: Yes Reason for Co-Treatment: Necessary to address cognition/behavior during functional activity PT goals addressed during session: Mobility/safety with mobility OT goals addressed during session: ADL's and self-care       End of Session Equipment Utilized During Treatment: Gait belt Activity Tolerance: Patient tolerated treatment well Patient left: in chair;with call bell/phone within reach;with chair alarm set Nurse Communication: Mobility status         Time: 1610-9604 PT Time Calculation (min) (ACUTE ONLY): 24 min   Charges:   PT Evaluation $PT Eval Moderate Complexity: 1 Procedure     PT G Codes:        Kristin Wilkerson April 29, 2015, 1:29 PM

## 2015-04-22 NOTE — Clinical Social Work Placement (Signed)
   CLINICAL SOCIAL WORK PLACEMENT  NOTE  Date:  04/22/2015  Patient Details  Name: Kristin Wilkerson MRN: [id number] Date of Birth: March 09, 1926  Clinical Social Work is seeking post-discharge placement for this patient at the Skilled  Nursing Facility level of care (*CSW will initial, date and re-position this form in  chart as items are completed):  No   Patient/family provided with [facility] Clinical [facility] Department's list of facilities offering this level of care within the geographic area requested by the patient (or if unable, by the patient's family).  Yes   Patient/family informed of their freedom to choose among providers that offer the needed level of care, that participate in Medicare, Medicaid or managed care program needed by the patient, have an available bed and are willing to accept the patient.  No   Patient/family informed of [facility]'s ownership interest in [facility] and [facility], as well as of the fact that they are under no obligation to receive care at these facilities.  PASRR submitted to EDS on       PASRR number received on       Existing PASRR number confirmed on 04/22/15     FL2 transmitted to all facilities in geographic area requested by pt/family on 04/22/15     FL2 transmitted to all facilities within larger geographic area on       Patient informed that his/her managed care company has contracts with or will negotiate with certain facilities, including the following:            Patient/family informed of bed offers received.  Patient chooses bed at       Physician recommends and patient chooses bed at      Patient to be transferred to   on  .  Patient to be transferred to facility by       Patient family notified on   of transfer.  Name of family member notified:        PHYSICIAN       Additional Comment:    _______________________________________________ Royetta AsalHaidinger, Lu Paradise Lee, LCSW  [phone] 04/22/2015, 9:54 AM

## 2015-04-22 NOTE — Progress Notes (Signed)
Subjective: 1 Day Post-Op Procedure(s) (LRB): IRRIGATION AND DEBRIDEMENT RIGHT KNEE WITH PATELLECTOMY AND REPAIR OF COMPLEX OPEN WOUND (Right) Patient reports pain as 2 on 0-10 scale. Hemovac DCd. Still very confused. Will arrange for SNF .   Objective: Vital signs in last 24 hours: Temp:  [97.4 F (36.3 C)-98.3 F (36.8 C)] 98 F (36.7 C) (01/04 0456) Pulse Rate:  [73-87] 81 (01/04 0456) Resp:  [15-20] 15 (01/04 0257) BP: (96-181)/(62-95) 152/63 mmHg (01/04 0456) SpO2:  [94 %-100 %] 96 % (01/04 0456) Weight:  [69.911 kg (154 lb 2 oz)] 69.911 kg (154 lb 2 oz) (01/03 1118)  Intake/Output from previous day: 01/03 0701 - 01/04 0700 In: 2811.7 [P.O.:420; I.V.:2341.7; IV Piggyback:50] Out: 550 [Urine:500; Blood:50] Intake/Output this shift: Total I/O In: 0  Out: 150 [Urine:150]   Recent Labs  04/22/15 0500  HGB 8.8*    Recent Labs  04/22/15 0500  WBC 7.6  RBC 3.06*  HCT 27.9*  PLT 393    Recent Labs  04/22/15 0500  NA 141  K 3.9  CL 106  CO2 26  BUN 12  CREATININE 0.78  GLUCOSE 99  CALCIUM 8.6*   No results for input(s): LABPT, INR in the last 72 hours.  Dorsiflexion/Plantar flexion intact  Assessment/Plan: 1 Day Post-Op Procedure(s) (LRB): IRRIGATION AND DEBRIDEMENT RIGHT KNEE WITH PATELLECTOMY AND REPAIR OF COMPLEX OPEN WOUND (Right) Up with therapy.Awaiting for SNF placement.  Dimetri Armitage A 04/22/2015, 8:13 AM

## 2015-04-22 NOTE — Evaluation (Signed)
Occupational Therapy Evaluation Patient Details Name: Kristin Wilkerson MRN: [id number] DOB: 09/18/1925 Today's Date: 04/22/2015    History of Present Illness [name] 82 y.o. female who presents with right total knee wounddisruption and Traumatic Rupture of Quadriceps Tendon. Patient is s/p IRRIGATION AND DEBRIDEMENT RIGHT KNEE WITH PATELLECTOMY AND REPAIR OF COMPLEX OPEN WOUND.    Clinical Impression   Plan is for patient to discharge to SNF. No acute OT needs identified, all needs can be met in SNF. Please send text page to OT services if any questions, concerns, or with new orders: [phone] OR call office at [phone]. Thank you for the order.      Follow Up Recommendations  SNF;Supervision/Assistance - 24 hour    Equipment Recommendations  Other (comment) (TBD next venue of care)    Recommendations for Other Services  None at this time    Precautions / Restrictions Precautions Precautions: Fall Required Braces or Orthoses: Knee Immobilizer - Right Knee Immobilizer - Right: On at all times Restrictions Weight Bearing Restrictions: Yes RLE Weight Bearing: Partial weight bearing    Mobility Bed Mobility Overal bed mobility: Needs Assistance;+2 for physical assistance;+ 2 for safety/equipment Bed Mobility: Supine to Sit     Supine to sit: Mod assist;HOB elevated;+2 for safety/equipment     General bed mobility comments: Assistance for BLE management and some assist for trunk control. Bed pad used for positioning to EOB.  Transfers Overall transfer level: Needs assistance Equipment used: Rolling walker (2 wheeled) Transfers: Sit to/from Stand Sit to Stand: Mod assist;+2 safety/equipment;From elevated surface   General transfer comment: Multimodal cueing for safety and sequencing    Balance Overall balance assessment: Needs assistance Sitting-balance support: No upper extremity supported;Feet supported Sitting balance-Leahy Scale: Fair     Standing  balance support: Bilateral upper extremity supported;During functional activity Standing balance-Leahy Scale: Fair    ADL Overall ADL's : Needs assistance/impaired Eating/Feeding: Set up;Sitting Eating/Feeding Details (indicate cue type and reason): in recliner Grooming: Set up;Sitting Grooming Details (indicate cue type and reason): in recliner Upper Body Bathing: Min guard;Sitting Upper Body Bathing Details (indicate cue type and reason): EOB unsupported Lower Body Bathing: Maximal assistance;Bed level   Upper Body Dressing : Min guard;Sitting Upper Body Dressing Details (indicate cue type and reason): EOB unsupported Lower Body Dressing: Maximal assistance;Bed level   Toilet Transfer: Moderate assistance;Stand-pivot;BSC;RW;+2 for safety/equipment Functional mobility during ADLs: Moderate assistance;Rolling walker;+2 for safety/equipment;Cueing for safety;Cueing for sequencing General ADL Comments: Max multimodal cueing for safety and sequencing during tasks.     Vision Additional Comments: unable to fully assess secondary to poor cognition          Pertinent Vitals/Pain Pain Assessment: Faces (pt did not state) Faces Pain Scale: No hurt     Hand Dominance Right   Extremity/Trunk Assessment Upper Extremity Assessment Upper Extremity Assessment: Generalized weakness   Lower Extremity Assessment Lower Extremity Assessment: Defer to PT evaluation       Communication Communication Communication: HOH   Cognition Arousal/Alertness: Awake/alert Behavior During Therapy: WFL for tasks assessed/performed Overall Cognitive Status: History of cognitive impairments - at baseline (no family/caregiver present )             Home Living Family/patient expects to be discharged to:: Skilled nursing facility Additional Comments: Pt from SNF, going back to SNF.       Prior Functioning/Environment Comments: Unsure, pt unable to state due to poor cognition    OT Diagnosis:  Generalized weakness;Acute pain   OT  Problem List: Decreased strength;Decreased activity tolerance;Impaired balance (sitting and/or standing);Decreased safety awareness;Decreased cognition;Decreased knowledge of use of DME or AE;Decreased knowledge of precautions;Pain   OT Treatment/Interventions:   N/a, no acute OT needs identified at this time. Plan is for patient to d/c to SNF.    OT Goals(Current goals can be found in the care plan section) Acute Rehab OT Goals Patient Stated Goal: none stated OT Goal Formulation:  (defer to SNF)  OT Frequency:   N/a, no acute OT needs identified at this time. Plan is for patient to d/c to SNF.    Barriers to D/C:  none known at this time        Co-evaluation PT/OT/SLP Co-Evaluation/Treatment: Yes Reason for Co-Treatment: Necessary to address cognition/behavior during functional activity;For patient/therapist safety   OT goals addressed during session: ADL's and self-care;Strengthening/ROM      End of Session Equipment Utilized During Treatment: Rolling walker;Right knee immobilizer  Activity Tolerance: Patient tolerated treatment well Patient left: in chair;with call bell/phone within reach;with chair alarm set   Time: 7035-0093 OT Time Calculation (min): 24 min Charges:  OT General Charges $OT Visit: 1 Procedure OT Evaluation $OT Eval Moderate Complexity: 1 Procedure  Chrys Racer , MS, OTR/L, CLT Pager: [phone]  04/22/2015, 1:04 PM

## 2015-04-23 ENCOUNTER — Inpatient Hospital Stay (HOSPITAL_COMMUNITY): Payer: Medicare Other

## 2015-04-23 ENCOUNTER — Encounter (HOSPITAL_COMMUNITY): Payer: Self-pay | Admitting: Radiology

## 2015-04-23 ENCOUNTER — Telehealth: Payer: Self-pay | Admitting: *Deleted

## 2015-04-23 DIAGNOSIS — R4189 Other symptoms and signs involving cognitive functions and awareness: Secondary | ICD-10-CM

## 2015-04-23 LAB — WOUND CULTURE: CULTURE: NO GROWTH

## 2015-04-23 LAB — CBC
HCT: 28.5 % — ABNORMAL LOW (ref 36.0–46.0)
HEMOGLOBIN: 8.8 g/dL — AB (ref 12.0–15.0)
MCH: 28.4 pg (ref 26.0–34.0)
MCHC: 30.9 g/dL (ref 30.0–36.0)
MCV: 91.9 fL (ref 78.0–100.0)
Platelets: 381 10*3/uL (ref 150–400)
RBC: 3.1 MIL/uL — ABNORMAL LOW (ref 3.87–5.11)
RDW: 16.9 % — AB (ref 11.5–15.5)
WBC: 6.1 10*3/uL (ref 4.0–10.5)

## 2015-04-23 LAB — BASIC METABOLIC PANEL
Anion gap: 7 (ref 5–15)
BUN: 9 mg/dL (ref 6–20)
CHLORIDE: 107 mmol/L (ref 101–111)
CO2: 27 mmol/L (ref 22–32)
Calcium: 8.5 mg/dL — ABNORMAL LOW (ref 8.9–10.3)
Creatinine, Ser: 0.78 mg/dL (ref 0.44–1.00)
GFR calc non Af Amer: >60 mL/min (ref >60)
Glucose, Bld: 99 mg/dL (ref 65–99)
Potassium: 3.7 mmol/L (ref 3.5–5.1)
SODIUM: 141 mmol/L (ref 135–145)

## 2015-04-23 LAB — TSH: TSH: 2.302 u[IU]/mL (ref 0.350–4.500)

## 2015-04-23 LAB — VITAMIN B12: Vitamin B-12: 495 pg/mL (ref 180–914)

## 2015-04-23 LAB — AMMONIA: Ammonia: 12 umol/L (ref 9–35)

## 2015-04-23 MED ORDER — ALPRAZOLAM 0.25 MG PO TABS
0.2500 mg | ORAL_TABLET | Freq: Four times a day (QID) | ORAL | Status: DC | PRN
  Administered 2015-04-23 – 2015-04-24 (×2): 0.25 mg via ORAL
  Filled 2015-04-23 (×2): qty 1

## 2015-04-23 NOTE — Progress Notes (Signed)
Physical Therapy Treatment Patient Details Name: Kristin Wilkerson MRN: [id number] DOB: Dec 29, 1925 Today's Date: 04/23/2015    History of Present Illness Kristin Wilkerson 82 y.o. female who presents with right total knee wounddisruption and Traumatic Rupture of Quadriceps Tendon. Patient is s/p IRRIGATION AND DEBRIDEMENT RIGHT KNEE WITH PATELLECTOMY AND REPAIR OF COMPLEX OPEN WOUND.     PT Comments    Pt demonstrating increased anxiety with constant redirection required to focus on task.  Follow Up Recommendations  SNF     Equipment Recommendations  Rolling walker with 5" wheels    Recommendations for Other Services OT consult     Precautions / Restrictions Precautions Precautions: Fall Required Braces or Orthoses: Knee Immobilizer - Right Knee Immobilizer - Right: On at all times Restrictions Weight Bearing Restrictions: Yes RLE Weight Bearing: Partial weight bearing    Mobility  Bed Mobility Overal bed mobility: Needs Assistance;+2 for physical assistance;+ 2 for safety/equipment Bed Mobility: Supine to Sit     Supine to sit: Mod assist;HOB elevated;+2 for safety/equipment     General bed mobility comments: Assistance for BLE management and some assist for trunk control. Bed pad used for positioning to EOB.  Transfers Overall transfer level: Needs assistance Equipment used: Rolling walker (2 wheeled) Transfers: Sit to/from Stand Sit to Stand: Mod assist;+2 safety/equipment;From elevated surface         General transfer comment: Multimodal cueing for safety and sequencing  Ambulation/Gait Ambulation/Gait assistance: Mod assist;+2 physical assistance;+2 safety/equipment Ambulation Distance (Feet): 7 Feet Assistive device: Rolling walker (2 wheeled) Gait Pattern/deviations: Step-to pattern;Decreased step length - right;Decreased step length - left;Shuffle;Trunk flexed;Antalgic Gait velocity: decr   General Gait Details: step-by-step cues for sequence, posture, foot  placement, and increased UE WB   Stairs            Wheelchair Mobility    Modified Rankin (Stroke Patients Only)       Balance                                    Cognition Arousal/Alertness: Awake/alert Behavior During Therapy: Anxious Overall Cognitive Status: History of cognitive impairments - at baseline       Memory: Decreased short-term memory              Exercises      General Comments        Pertinent Vitals/Pain Pain Assessment: Faces Faces Pain Scale: Hurts little more Pain Location: R leg Pain Descriptors / Indicators: Grimacing Pain Intervention(s): Limited activity within patient's tolerance;Monitored during session;Ice applied    Home Living                      Prior Function            PT Goals (current goals can now be found in the care plan section) Acute Rehab PT Goals Patient Stated Goal: none stated PT Goal Formulation: Patient unable to participate in goal setting Time For Goal Achievement: 04/27/15 Potential to Achieve Goals: Fair Progress towards PT goals: Progressing toward goals    Frequency  Min 3X/week    PT Plan Current plan remains appropriate    Co-evaluation             End of Session Equipment Utilized During Treatment: Gait belt Activity Tolerance: Patient tolerated treatment well Patient left: in chair;with call bell/phone within reach;with chair alarm set     Time:  4098-1191 PT Time Calculation (min) (ACUTE ONLY): 29 min  Charges:  $Gait Training: 8-22 mins $Therapeutic Activity: 8-22 mins                    G Codes:      Dora Clauss 05-07-15, 5:14 PM

## 2015-04-23 NOTE — Progress Notes (Signed)
Subjective: 2 Days Post-Op Procedure(s) (LRB): IRRIGATION AND DEBRIDEMENT RIGHT KNEE WITH PATELLECTOMY AND REPAIR OF COMPLEX OPEN WOUND (Right) Patient reports pain as 2 on 0-10 scale.Stii confused. Will get a neurology consult to re-evaluate.We plan on DC tomorrow. HBg stable at 8.8.Thus far her wound cultures and GM Stain are normal.    Objective: Vital signs in last 24 hours: Temp:  [98.1 F (36.7 C)-98.8 F (37.1 C)] 98.4 F (36.9 C) (01/05 0533) Pulse Rate:  [66-86] 71 (01/05 0533) Resp:  [16-18] 16 (01/05 0533) BP: (104-150)/(45-66) 150/61 mmHg (01/05 0533) SpO2:  [94 %-98 %] 97 % (01/05 0533)  Intake/Output from previous day: 01/04 0701 - 01/05 0700 In: 1980 [P.O.:780; I.V.:1200] Out: 1075 [Urine:1075] Intake/Output this shift:     Recent Labs  04/22/15 0500 04/23/15 0530  HGB 8.8* 8.8*    Recent Labs  04/22/15 0500 04/23/15 0530  WBC 7.6 6.1  RBC 3.06* 3.10*  HCT 27.9* 28.5*  PLT 393 381    Recent Labs  04/22/15 0500 04/23/15 0530  NA 141 141  K 3.9 3.7  CL 106 107  CO2 26 27  BUN 12 9  CREATININE 0.78 0.78  GLUCOSE 99 99  CALCIUM 8.6* 8.5*   No results for input(s): LABPT, INR in the last 72 hours.  Dorsiflexion/Plantar flexion intact  Assessment/Plan: 2 Days Post-Op Procedure(s) (LRB): IRRIGATION AND DEBRIDEMENT RIGHT KNEE WITH PATELLECTOMY AND REPAIR OF COMPLEX OPEN WOUND (Right) Discharge to SNF tomorrow.  Shahin Knierim A 04/23/2015, 7:25 AM

## 2015-04-23 NOTE — Telephone Encounter (Signed)
Received call from Dr Elspeth ChoPeter Sumner requesting pt have FU with Dr Pearlean BrownieSethi or NP for FU hospitalization for fall, dementia. Dr Pearlean BrownieSethi did not have availability for several weeks. Scheduled with Dolores HooseM Millikan, NP with Dr Minus BreedingSumner's approval, for 04/28/15. He stated patient's family will be made aware.

## 2015-04-23 NOTE — Consult Note (Signed)
Consult Reason for Consult:cognitive decline, altered mental status Referring Physician: Dr Craig Staggers  CC: cognitive decline  HPI: Kristin Wilkerson is an 82 y.o. female hx of prior CVA, cognitive decline, HTN ,HLD presenting for surgical correction of right knee wound deheiscence after suffering a fall. Neurology consulted after concern for confusion and cognitive decline over the past few months. Patient had a successful right total knee arthroplasty in 01/2015, since then family feels she has had a cognitive decline. On 1/03 she underwent wound irrigation and debridement. Per report, her family feels she has remained confused since this procedure.   She was started on Xarelto on 1/04. Post procedure she received 2 doses of 0.5mg  dilaudid, 4 doses of robaxin 500mg  and one dose of tramadol. She remains afebrile with unremarkable basic lab workup.   She has a history of a remote right PCA infarct for which she is followed by Dr Pearlean Brownie at [facility]. CT head imaging from 03/2015 reviewed shows no acute process but signs of chronic right PCA infarct.   Past Medical History  Diagnosis Date  . Hypertension   . Abnormality of gait   . Disturbance of skin sensation   . Tachycardia, unspecified   . Internal hemorrhoids without mention of complication   . Synovial cyst of popliteal space   . Spinal stenosis, other region   . Sciatica   . Lumbago   . Unspecified vitamin D deficiency   . Anxiety   . Hyperlipidemia   . Abnormality of gait   . Unspecified sinusitis (chronic)   . Unspecified pruritic disorder   . Pain in joint, lower leg   . Depressive disorder, not elsewhere classified   . Unspecified glaucoma   . Reflux esophagitis   . Diverticulosis of colon (without mention of hemorrhage)   . Pain in joint, site unspecified   . Myalgia and myositis, unspecified   . Osteoporosis, unspecified   . Other malaise and fatigue   . Cervicalgia   . Benign neoplasm of colon   . H/O echocardiogram 09/01/11     EF >55%, mild MR, RV systolic pressure elevated at 16-10 mmHg, mod TR  . Normal cardiac stress test 11/16/09    low risk scan, EF  77%  . Shortness of breath   . Stroke [facility]) "a couple of years ago"     visual problems left eye  . Arthritis   . Swelling of both ankles     rt leg  . Bruises easily   . Complication of anesthesia     SLOW TO AWAKEN MORE CONFUSION AFTER LAST SURGERY.WANTS SPINAL OR NERVE BLOCK IF POSSIBLE    Past Surgical History  Procedure Laterality Date  . Lumbar laminectomy  12/04/2012    Complete decompressive L3-4 and L4-5--Dr. Darrelyn Hillock  . Abdominal hysterectomy    . Cholecystectomy    . Cardiovascular stress test  11/16/2009    Perfusion defect seen in inferior myocardial region consistent with diaphragmatic attenuation. Remaining myocardium demonstrates normalmyocardial perfusion with no evidence of ischemia or infarct. No ECG changes. EKG negative for ischemia.  . Transthoracic echocardiogram  09/01/2011    EF >55% and moderate tricuspid valve regurg.  . Bilateral oophorectomy    . Total knee arthroplasty Right 02/11/2015    Procedure: TOTAL KNEE ARTHROPLASTY;  Surgeon: Ranee Gosselin, MD;  Location: [facility];  Service: Orthopedics;  Laterality: Right;  . I&d knee with poly exchange Right 04/21/2015    Procedure: IRRIGATION AND DEBRIDEMENT RIGHT KNEE WITH PATELLECTOMY AND REPAIR  OF COMPLEX OPEN WOUND;  Surgeon: Ranee Gosselin, MD;  Location: [facility];  Service: Orthopedics;  Laterality: Right;    Family History  Problem Relation Age of Onset  . Cancer Sister   . Stroke Mother   . Stroke Father     Social History:  reports that she has never smoked. She has never used smokeless tobacco. She reports that she does not drink alcohol or use illicit drugs.  No Known Allergies  Medications: I have reviewed the patient's current medications.  ROS: Out of a complete 14 system review, the patient complains of only the following symptoms, and all other reviewed systems  are negative. +confusion  Physical Examination: Filed Vitals:   04/23/15 0533 04/23/15 1013  BP: 150/61 117/71  Pulse: 71 78  Temp: 98.4 F (36.9 C)   Resp: 16    Physical Exam  Constitutional: He appears well-developed and well-nourished.  Psych: Affect appropriate to situation Eyes: No scleral injection HENT: No OP obstrucion Head: Normocephalic.  Cardiovascular: Normal rate and regular rhythm.  Respiratory: Effort normal and breath sounds normal.  GI: Soft. Bowel sounds are normal. No distension. There is no tenderness.  Skin: WDI  Neurologic Examination Mental Status: Alert, oriented to name, "hospital (though states [facility]), 2017. Knows current and future POTUS, thought content appropriate though delayed with responses.  Speech fluent without evidence of aphasia. Difficulty following multi-step commands +Glabellar reflex Cranial Nerves: II: optic discs not visualized, visual fields grossly normal, pupils equal, round, reactive to light III,IV, VI: ptosis not present, extra-ocular motions intact bilaterally V,VII: smile symmetric, facial light touch sensation normal bilaterally VIII: hearing normal bilaterally IX,X: gag reflex present XI: trapezius strength/neck flexion strength normal bilaterally XII: tongue strength normal  Motor: Limited testing of RLE due to recent surgery but otherwise appears to move all extremities symmetrically and against light resistance Sensory: Pinprick and light touch intact throughout, bilaterally Deep Tendon Reflexes: 1+ and symmetric throughout, did not test R patella or achilles Plantars: Right: downgoing   Left: downgoing Cerebellar: Normal FTN bilateral Gait: deferred  Laboratory Studies:   Basic Metabolic Panel:  Recent Labs Lab 04/17/15 1230 04/22/15 0500 04/23/15 0530  NA 140 141 141  K 3.8 3.9 3.7  CL 104 106 107  CO2 27 26 27   GLUCOSE 101* 99 99  BUN 18 12 9   CREATININE 0.69 0.78 0.78  CALCIUM 8.7* 8.6*  8.5*    Liver Function Tests:  Recent Labs Lab 04/17/15 1230  AST 18  ALT 13*  ALKPHOS 120  BILITOT 0.7  PROT 6.6  ALBUMIN 2.9*   No results for input(s): LIPASE, AMYLASE in the last 168 hours. No results for input(s): AMMONIA in the last 168 hours.  CBC:  Recent Labs Lab 04/17/15 1230 04/22/15 0500 04/23/15 0530  WBC 8.3 7.6 6.1  NEUTROABS 5.1  --   --   HGB 10.4* 8.8* 8.8*  HCT 32.7* 27.9* 28.5*  MCV 90.3 91.2 91.9  PLT 476* 393 381    Cardiac Enzymes: No results for input(s): CKTOTAL, CKMB, CKMBINDEX, TROPONINI in the last 168 hours.  BNP: Invalid input(s): POCBNP  CBG: No results for input(s): GLUCAP in the last 168 hours.  Microbiology: Results for orders placed or performed during the hospital encounter of 04/21/15  Wound culture     Status: None   Collection Time: 04/21/15 12:15 PM  Result Value Ref Range Status   Specimen Description KNEE RIGHT  Final   Special Requests NONE  Final   Gram  Stain   Final    RARE WBC PRESENT,BOTH PMN AND MONONUCLEAR NO SQUAMOUS EPITHELIAL CELLS SEEN NO ORGANISMS SEEN Performed at [facility]    Culture   Final    NO GROWTH 2 DAYS Performed at [facility]    Report Status 04/23/2015 FINAL  Final  Anaerobic culture     Status: None (Preliminary result)   Collection Time: 04/21/15 12:15 PM  Result Value Ref Range Status   Specimen Description KNEE RIGHT  Final   Special Requests NONE  Final   Gram Stain   Final    RARE WBC PRESENT,BOTH PMN AND MONONUCLEAR NO SQUAMOUS EPITHELIAL CELLS SEEN NO ORGANISMS SEEN Performed at [facility]    Culture   Final    NO ANAEROBES ISOLATED; CULTURE IN PROGRESS FOR 5 DAYS Performed at [facility]    Report Status PENDING  Incomplete    Coagulation Studies: No results for input(s): LABPROT, INR in the last 72 hours.  Urinalysis: No results for input(s): COLORURINE, LABSPEC, PHURINE, GLUCOSEU, HGBUR, BILIRUBINUR, KETONESUR, PROTEINUR,  UROBILINOGEN, NITRITE, LEUKOCYTESUR in the last 168 hours.  Invalid input(s): APPERANCEUR  Lipid Panel:     Component Value Date/Time   CHOL 179 02/21/2014 1046   CHOL 187 03/01/2013 0516   TRIG 219* 02/21/2014 1046   HDL 46 02/21/2014 1046   HDL 60 03/01/2013 0516   CHOLHDL 3.9 02/21/2014 1046   CHOLHDL 3.1 03/01/2013 0516   VLDL 20 03/01/2013 0516   LDLCALC 89 02/21/2014 1046   LDLCALC 107* 03/01/2013 0516    HgbA1C:  Lab Results  Component Value Date   HGBA1C 5.7* 03/01/2013    Urine Drug Screen:     Component Value Date/Time   LABOPIA NONE DETECTED 02/28/2013 1923   COCAINSCRNUR NONE DETECTED 02/28/2013 1923   LABBENZ NONE DETECTED 02/28/2013 1923   AMPHETMU NONE DETECTED 02/28/2013 1923   THCU NONE DETECTED 02/28/2013 1923   LABBARB NONE DETECTED 02/28/2013 1923    Alcohol Level: No results for input(s): ETH in the last 168 hours.  Other results:  Imaging: No results found.   Assessment/Plan:  82y/o woman hx of prior CVA, HTN, HLD presenting for surgical correction of wound dehiscence of right knee after suffering a fall. Neurology consulted after family notes concern over possible cognitive decline over the past few months with increased confusion after recent surgery. Suspect patient has an underlying dementia and currently has increased delirium due to hospitalization and recent surgical procedure. Low suspicion for SDH but cannot rule this out in the setting of increased confusion in an elderly patient with a recent fall. Overall non-focal exam makes CVA less likely but this also would be in the differential.   Patient warrants further dementia workup which can be completed in the outpatient setting. Will check head CT, B12, TSH, ammonia at this time. Scheduled patient to see Junie SpencerMeghan Millikan of [facility] on January 10 at 0830, will defer further dementia workup until that time.     Elspeth Choeter Kierston Plasencia, DO [facility] [phone]  If 7pm-  7am, please page neurology on call as listed in AMION. 04/23/2015, 11:08 AM

## 2015-04-24 DIAGNOSIS — Z4789 Encounter for other orthopedic aftercare: Secondary | ICD-10-CM | POA: Diagnosis not present

## 2015-04-24 DIAGNOSIS — M6281 Muscle weakness (generalized): Secondary | ICD-10-CM | POA: Diagnosis not present

## 2015-04-24 DIAGNOSIS — Z66 Do not resuscitate: Secondary | ICD-10-CM | POA: Diagnosis not present

## 2015-04-24 DIAGNOSIS — K219 Gastro-esophageal reflux disease without esophagitis: Secondary | ICD-10-CM | POA: Diagnosis not present

## 2015-04-24 DIAGNOSIS — Z9181 History of falling: Secondary | ICD-10-CM | POA: Diagnosis not present

## 2015-04-24 DIAGNOSIS — K648 Other hemorrhoids: Secondary | ICD-10-CM | POA: Diagnosis not present

## 2015-04-24 DIAGNOSIS — F0391 Unspecified dementia with behavioral disturbance: Secondary | ICD-10-CM | POA: Diagnosis not present

## 2015-04-24 DIAGNOSIS — R262 Difficulty in walking, not elsewhere classified: Secondary | ICD-10-CM | POA: Diagnosis not present

## 2015-04-24 DIAGNOSIS — Z993 Dependence on wheelchair: Secondary | ICD-10-CM | POA: Diagnosis not present

## 2015-04-24 DIAGNOSIS — M1990 Unspecified osteoarthritis, unspecified site: Secondary | ICD-10-CM | POA: Diagnosis present

## 2015-04-24 DIAGNOSIS — Z79899 Other long term (current) drug therapy: Secondary | ICD-10-CM | POA: Diagnosis not present

## 2015-04-24 DIAGNOSIS — J309 Allergic rhinitis, unspecified: Secondary | ICD-10-CM | POA: Diagnosis not present

## 2015-04-24 DIAGNOSIS — I2699 Other pulmonary embolism without acute cor pulmonale: Secondary | ICD-10-CM | POA: Diagnosis not present

## 2015-04-24 DIAGNOSIS — F329 Major depressive disorder, single episode, unspecified: Secondary | ICD-10-CM | POA: Diagnosis present

## 2015-04-24 DIAGNOSIS — R29898 Other symptoms and signs involving the musculoskeletal system: Secondary | ICD-10-CM | POA: Diagnosis not present

## 2015-04-24 DIAGNOSIS — E785 Hyperlipidemia, unspecified: Secondary | ICD-10-CM | POA: Diagnosis not present

## 2015-04-24 DIAGNOSIS — F418 Other specified anxiety disorders: Secondary | ICD-10-CM | POA: Diagnosis not present

## 2015-04-24 DIAGNOSIS — D649 Anemia, unspecified: Secondary | ICD-10-CM | POA: Diagnosis present

## 2015-04-24 DIAGNOSIS — M25561 Pain in right knee: Secondary | ICD-10-CM | POA: Diagnosis not present

## 2015-04-24 DIAGNOSIS — Z471 Aftercare following joint replacement surgery: Secondary | ICD-10-CM | POA: Diagnosis not present

## 2015-04-24 DIAGNOSIS — F419 Anxiety disorder, unspecified: Secondary | ICD-10-CM | POA: Diagnosis present

## 2015-04-24 DIAGNOSIS — E559 Vitamin D deficiency, unspecified: Secondary | ICD-10-CM | POA: Diagnosis present

## 2015-04-24 DIAGNOSIS — R41841 Cognitive communication deficit: Secondary | ICD-10-CM | POA: Diagnosis not present

## 2015-04-24 DIAGNOSIS — M2550 Pain in unspecified joint: Secondary | ICD-10-CM | POA: Diagnosis not present

## 2015-04-24 DIAGNOSIS — M810 Age-related osteoporosis without current pathological fracture: Secondary | ICD-10-CM | POA: Diagnosis present

## 2015-04-24 DIAGNOSIS — Z823 Family history of stroke: Secondary | ICD-10-CM | POA: Diagnosis not present

## 2015-04-24 DIAGNOSIS — Z96651 Presence of right artificial knee joint: Secondary | ICD-10-CM | POA: Diagnosis present

## 2015-04-24 DIAGNOSIS — K5730 Diverticulosis of large intestine without perforation or abscess without bleeding: Secondary | ICD-10-CM | POA: Diagnosis not present

## 2015-04-24 DIAGNOSIS — T8131XD Disruption of external operation (surgical) wound, not elsewhere classified, subsequent encounter: Secondary | ICD-10-CM | POA: Diagnosis not present

## 2015-04-24 DIAGNOSIS — S76111D Strain of right quadriceps muscle, fascia and tendon, subsequent encounter: Secondary | ICD-10-CM | POA: Diagnosis not present

## 2015-04-24 DIAGNOSIS — M609 Myositis, unspecified: Secondary | ICD-10-CM | POA: Diagnosis present

## 2015-04-24 DIAGNOSIS — Z515 Encounter for palliative care: Secondary | ICD-10-CM | POA: Diagnosis not present

## 2015-04-24 DIAGNOSIS — Z8673 Personal history of transient ischemic attack (TIA), and cerebral infarction without residual deficits: Secondary | ICD-10-CM | POA: Diagnosis not present

## 2015-04-24 DIAGNOSIS — I071 Rheumatic tricuspid insufficiency: Secondary | ICD-10-CM | POA: Diagnosis present

## 2015-04-24 DIAGNOSIS — L299 Pruritus, unspecified: Secondary | ICD-10-CM | POA: Diagnosis not present

## 2015-04-24 DIAGNOSIS — F331 Major depressive disorder, recurrent, moderate: Secondary | ICD-10-CM | POA: Diagnosis not present

## 2015-04-24 DIAGNOSIS — M545 Low back pain: Secondary | ICD-10-CM | POA: Diagnosis present

## 2015-04-24 DIAGNOSIS — Z7982 Long term (current) use of aspirin: Secondary | ICD-10-CM | POA: Diagnosis not present

## 2015-04-24 DIAGNOSIS — I5032 Chronic diastolic (congestive) heart failure: Secondary | ICD-10-CM | POA: Diagnosis not present

## 2015-04-24 DIAGNOSIS — R55 Syncope and collapse: Secondary | ICD-10-CM | POA: Diagnosis not present

## 2015-04-24 DIAGNOSIS — H409 Unspecified glaucoma: Secondary | ICD-10-CM | POA: Diagnosis present

## 2015-04-24 DIAGNOSIS — R404 Transient alteration of awareness: Secondary | ICD-10-CM | POA: Diagnosis not present

## 2015-04-24 DIAGNOSIS — R278 Other lack of coordination: Secondary | ICD-10-CM | POA: Diagnosis not present

## 2015-04-24 DIAGNOSIS — K5900 Constipation, unspecified: Secondary | ICD-10-CM | POA: Diagnosis not present

## 2015-04-24 DIAGNOSIS — F0390 Unspecified dementia without behavioral disturbance: Secondary | ICD-10-CM | POA: Diagnosis not present

## 2015-04-24 DIAGNOSIS — M5430 Sciatica, unspecified side: Secondary | ICD-10-CM | POA: Diagnosis present

## 2015-04-24 DIAGNOSIS — I10 Essential (primary) hypertension: Secondary | ICD-10-CM | POA: Diagnosis not present

## 2015-04-24 DIAGNOSIS — M542 Cervicalgia: Secondary | ICD-10-CM | POA: Diagnosis present

## 2015-04-24 DIAGNOSIS — Z809 Family history of malignant neoplasm, unspecified: Secondary | ICD-10-CM | POA: Diagnosis not present

## 2015-04-24 DIAGNOSIS — Z7401 Bed confinement status: Secondary | ICD-10-CM | POA: Diagnosis not present

## 2015-04-24 DIAGNOSIS — J329 Chronic sinusitis, unspecified: Secondary | ICD-10-CM | POA: Diagnosis present

## 2015-04-24 LAB — CBC
HEMATOCRIT: 26.8 % — AB (ref 36.0–46.0)
Hemoglobin: 8.4 g/dL — ABNORMAL LOW (ref 12.0–15.0)
MCH: 28.5 pg (ref 26.0–34.0)
MCHC: 31.3 g/dL (ref 30.0–36.0)
MCV: 90.8 fL (ref 78.0–100.0)
PLATELETS: 364 10*3/uL (ref 150–400)
RBC: 2.95 MIL/uL — ABNORMAL LOW (ref 3.87–5.11)
RDW: 16.7 % — AB (ref 11.5–15.5)
WBC: 6.1 10*3/uL (ref 4.0–10.5)

## 2015-04-24 MED ORDER — CALCIUM CARBONATE-VITAMIN D 600-400 MG-UNIT PO TABS: 1.0000 | ORAL_TABLET | Freq: Every day | ORAL | Status: DC

## 2015-04-24 MED ORDER — DULOXETINE HCL 30 MG PO CPEP: 30.0000 mg | ORAL_CAPSULE | Freq: Every day | ORAL | Status: DC

## 2015-04-24 MED ORDER — FUROSEMIDE 20 MG PO TABS: 20.0000 mg | ORAL_TABLET | ORAL | Status: DC

## 2015-04-24 MED ORDER — TRAMADOL HCL 50 MG PO TABS: 50.0000 mg | ORAL_TABLET | Freq: Four times a day (QID) | ORAL | Status: DC | PRN

## 2015-04-24 MED ORDER — METHOCARBAMOL 500 MG PO TABS: 500.0000 mg | ORAL_TABLET | Freq: Four times a day (QID) | ORAL | Status: DC | PRN

## 2015-04-24 MED ORDER — DOXYCYCLINE HYCLATE 100 MG PO TABS: 100.0000 mg | ORAL_TABLET | Freq: Two times a day (BID) | ORAL | Status: DC

## 2015-04-24 NOTE — Discharge Summary (Signed)
Physician Discharge Summary   Patient ID: Kristin Wilkerson MRN: [id number] DOB/AGE: October 08, 1925 82 y.o.  Admit date: 04/21/2015 Discharge date: 04/24/2015  Primary Diagnosis: Right knee wound dehiscence and quadriceps rupture with history of right total knee arthroplasty  Admission Diagnoses:  Past Medical History  Diagnosis Date  . Hypertension   . Abnormality of gait   . Disturbance of skin sensation   . Tachycardia, unspecified   . Internal hemorrhoids without mention of complication   . Synovial cyst of popliteal space   . Spinal stenosis, other region   . Sciatica   . Lumbago   . Unspecified vitamin D deficiency   . Anxiety   . Hyperlipidemia   . Abnormality of gait   . Unspecified sinusitis (chronic)   . Unspecified pruritic disorder   . Pain in joint, lower leg   . Depressive disorder, not elsewhere classified   . Unspecified glaucoma   . Reflux esophagitis   . Diverticulosis of colon (without mention of hemorrhage)   . Pain in joint, site unspecified   . Myalgia and myositis, unspecified   . Osteoporosis, unspecified   . Other malaise and fatigue   . Cervicalgia   . Benign neoplasm of colon   . H/O echocardiogram 09/01/11    EF >55%, mild MR, RV systolic pressure elevated at 30-40 mmHg, mod TR  . Normal cardiac stress test 11/16/09    low risk scan, EF  77%  . Shortness of breath   . Stroke [facility]) "a couple of years ago"     visual problems left eye  . Arthritis   . Swelling of both ankles     rt leg  . Bruises easily   . Complication of anesthesia     SLOW TO AWAKEN MORE CONFUSION AFTER LAST SURGERY.WANTS SPINAL OR NERVE BLOCK IF POSSIBLE   Discharge Diagnoses:   Active Problems:   Quadriceps muscle rupture  Estimated body mass index is 29.14 kg/(m^2) as calculated from the following:   Height as of this encounter: _0  (1.549 m).   Weight as of this encounter: 69.911 kg (154 lb 2 oz).  Procedure:  Procedure(s) (LRB): IRRIGATION AND DEBRIDEMENT RIGHT KNEE  WITH PATELLECTOMY AND REPAIR OF COMPLEX OPEN WOUND (Right)   Consults: neurology  HPI: The patient is a 82 year old female who underwent a successful right total knee arthroplasty several months ago but has fallen several times secondary to her dementia. As a result sustained a wound dehiscence over the right total knee as well as a complete quadriceps tendon rupture.   Laboratory Data: Admission on 04/21/2015  Component Date Value Ref Range Status  . Specimen Description 04/21/2015 KNEE RIGHT   Final  . Special Requests 04/21/2015 NONE   Final  . Gram Stain 04/21/2015    Final                   Value:RARE WBC PRESENT,BOTH PMN AND MONONUCLEAR NO SQUAMOUS EPITHELIAL CELLS SEEN NO ORGANISMS SEEN Performed at [facility]   . Culture 04/21/2015    Final                   Value:NO GROWTH 2 DAYS Performed at [facility]   . Report Status 04/21/2015 04/23/2015 FINAL   Final  . Specimen Description 04/21/2015 KNEE RIGHT   Final  . Special Requests 04/21/2015 NONE   Final  . Gram Stain 04/21/2015    Final  Value:RARE WBC PRESENT,BOTH PMN AND MONONUCLEAR NO SQUAMOUS EPITHELIAL CELLS SEEN NO ORGANISMS SEEN Performed at [facility]   . Culture 04/21/2015    Final                   Value:NO ANAEROBES ISOLATED; CULTURE IN PROGRESS FOR 5 DAYS Performed at [facility]   . Report Status 04/21/2015 PENDING   Incomplete  . WBC 04/22/2015 7.6  4.0 - 10.5 K/uL Final  . RBC 04/22/2015 3.06* 3.87 - 5.11 MIL/uL Final  . Hemoglobin 04/22/2015 8.8* 12.0 - 15.0 g/dL Final  . HCT 04/22/2015 27.9* 36.0 - 46.0 % Final  . MCV 04/22/2015 91.2  78.0 - 100.0 fL Final  . MCH 04/22/2015 28.8  26.0 - 34.0 pg Final  . MCHC 04/22/2015 31.5  30.0 - 36.0 g/dL Final  . RDW 04/22/2015 16.7* 11.5 - 15.5 % Final  . Platelets 04/22/2015 393  150 - 400 K/uL Final  . Sodium 04/22/2015 141  135 - 145 mmol/L Final  . Potassium 04/22/2015 3.9  3.5 - 5.1 mmol/L Final    . Chloride 04/22/2015 106  101 - 111 mmol/L Final  . CO2 04/22/2015 26  22 - 32 mmol/L Final  . Glucose, Bld 04/22/2015 99  65 - 99 mg/dL Final  . BUN 04/22/2015 12  6 - 20 mg/dL Final  . Creatinine, Ser 04/22/2015 0.78  0.44 - 1.00 mg/dL Final  . Calcium 04/22/2015 8.6* 8.9 - 10.3 mg/dL Final  . GFR calc non Af Amer 04/22/2015 >60  >60 mL/min Final  . GFR calc Af Amer 04/22/2015 >60  >60 mL/min Final   Comment: (NOTE) The eGFR has been calculated using the CKD EPI equation. This calculation has not been validated in all clinical situations. eGFR's persistently <60 mL/min signify possible Chronic Kidney Disease.   . Anion gap 04/22/2015 9  5 - 15 Final  . WBC 04/23/2015 6.1  4.0 - 10.5 K/uL Final  . RBC 04/23/2015 3.10* 3.87 - 5.11 MIL/uL Final  . Hemoglobin 04/23/2015 8.8* 12.0 - 15.0 g/dL Final  . HCT 04/23/2015 28.5* 36.0 - 46.0 % Final  . MCV 04/23/2015 91.9  78.0 - 100.0 fL Final  . MCH 04/23/2015 28.4  26.0 - 34.0 pg Final  . MCHC 04/23/2015 30.9  30.0 - 36.0 g/dL Final  . RDW 04/23/2015 16.9* 11.5 - 15.5 % Final  . Platelets 04/23/2015 381  150 - 400 K/uL Final  . Sodium 04/23/2015 141  135 - 145 mmol/L Final  . Potassium 04/23/2015 3.7  3.5 - 5.1 mmol/L Final  . Chloride 04/23/2015 107  101 - 111 mmol/L Final  . CO2 04/23/2015 27  22 - 32 mmol/L Final  . Glucose, Bld 04/23/2015 99  65 - 99 mg/dL Final  . BUN 04/23/2015 9  6 - 20 mg/dL Final  . Creatinine, Ser 04/23/2015 0.78  0.44 - 1.00 mg/dL Final  . Calcium 04/23/2015 8.5* 8.9 - 10.3 mg/dL Final  . GFR calc non Af Amer 04/23/2015 >60  >60 mL/min Final  . GFR calc Af Amer 04/23/2015 >60  >60 mL/min Final   Comment: (NOTE) The eGFR has been calculated using the CKD EPI equation. This calculation has not been validated in all clinical situations. eGFR's persistently <60 mL/min signify possible Chronic Kidney Disease.   . Anion gap 04/23/2015 7  5 - 15 Final  . Vitamin B-12 04/23/2015 495  180 - 914 pg/mL Final    Comment: (NOTE) This assay is  not validated for testing neonatal or myeloproliferative syndrome specimens for Vitamin B12 levels. Performed at [facility]   . TSH 04/23/2015 2.302  0.350 - 4.500 uIU/mL Final  . Ammonia 04/23/2015 12  9 - 35 umol/L Final  . WBC 04/24/2015 6.1  4.0 - 10.5 K/uL Final  . RBC 04/24/2015 2.95* 3.87 - 5.11 MIL/uL Final  . Hemoglobin 04/24/2015 8.4* 12.0 - 15.0 g/dL Final  . HCT 04/24/2015 26.8* 36.0 - 46.0 % Final  . MCV 04/24/2015 90.8  78.0 - 100.0 fL Final  . MCH 04/24/2015 28.5  26.0 - 34.0 pg Final  . MCHC 04/24/2015 31.3  30.0 - 36.0 g/dL Final  . RDW 04/24/2015 16.7* 11.5 - 15.5 % Final  . Platelets 04/24/2015 364  150 - 400 K/uL Final  Hospital Outpatient Visit on 04/17/2015  Component Date Value Ref Range Status  . MRSA, PCR 04/17/2015 NEGATIVE  NEGATIVE Final  . Staphylococcus aureus 04/17/2015 NEGATIVE  NEGATIVE Final   Comment:        The Xpert SA Assay (FDA approved for NASAL specimens in patients over 2 years of age), is one component of a comprehensive surveillance program.  Test performance has been validated by [facility] for patients greater than or equal to 47 year old. It is not intended to diagnose infection nor to guide or monitor treatment.   . WBC 04/17/2015 8.3  4.0 - 10.5 K/uL Final  . RBC 04/17/2015 3.62* 3.87 - 5.11 MIL/uL Final  . Hemoglobin 04/17/2015 10.4* 12.0 - 15.0 g/dL Final  . HCT 04/17/2015 32.7* 36.0 - 46.0 % Final  . MCV 04/17/2015 90.3  78.0 - 100.0 fL Final  . MCH 04/17/2015 28.7  26.0 - 34.0 pg Final  . MCHC 04/17/2015 31.8  30.0 - 36.0 g/dL Final  . RDW 04/17/2015 16.7* 11.5 - 15.5 % Final  . Platelets 04/17/2015 476* 150 - 400 K/uL Final  . Neutrophils Relative % 04/17/2015 61   Final  . Neutro Abs 04/17/2015 5.1  1.7 - 7.7 K/uL Final  . Lymphocytes Relative 04/17/2015 22   Final  . Lymphs Abs 04/17/2015 1.8  0.7 - 4.0 K/uL Final  . Monocytes Relative 04/17/2015 13   Final  . Monocytes  Absolute 04/17/2015 1.1* 0.1 - 1.0 K/uL Final  . Eosinophils Relative 04/17/2015 4   Final  . Eosinophils Absolute 04/17/2015 0.3  0.0 - 0.7 K/uL Final  . Basophils Relative 04/17/2015 0   Final  . Basophils Absolute 04/17/2015 0.0  0.0 - 0.1 K/uL Final  . Sodium 04/17/2015 140  135 - 145 mmol/L Final  . Potassium 04/17/2015 3.8  3.5 - 5.1 mmol/L Final  . Chloride 04/17/2015 104  101 - 111 mmol/L Final  . CO2 04/17/2015 27  22 - 32 mmol/L Final  . Glucose, Bld 04/17/2015 101* 65 - 99 mg/dL Final  . BUN 04/17/2015 18  6 - 20 mg/dL Final  . Creatinine, Ser 04/17/2015 0.69  0.44 - 1.00 mg/dL Final  . Calcium 04/17/2015 8.7* 8.9 - 10.3 mg/dL Final  . Total Protein 04/17/2015 6.6  6.5 - 8.1 g/dL Final  . Albumin 04/17/2015 2.9* 3.5 - 5.0 g/dL Final  . AST 04/17/2015 18  15 - 41 U/L Final  . ALT 04/17/2015 13* 14 - 54 U/L Final  . Alkaline Phosphatase 04/17/2015 120  38 - 126 U/L Final  . Total Bilirubin 04/17/2015 0.7  0.3 - 1.2 mg/dL Final  . GFR calc non Af Amer 04/17/2015 >60  >60  mL/min Final  . GFR calc Af Amer 04/17/2015 >60  >60 mL/min Final   Comment: (NOTE) The eGFR has been calculated using the CKD EPI equation. This calculation has not been validated in all clinical situations. eGFR's persistently <60 mL/min signify possible Chronic Kidney Disease.   . Anion gap 04/17/2015 9  5 - 15 Final  . Prothrombin Time 04/17/2015 14.5  11.6 - 15.2 seconds Final  . INR 04/17/2015 1.11  0.00 - 1.49 Final  . ABO/RH(D) 04/17/2015 O POS   Final  . Antibody Screen 04/17/2015 NEG   Final  . Sample Expiration 04/17/2015 04/24/2015   Final  . Extend sample reason 04/17/2015 NO TRANSFUSIONS OR PREGNANCY IN THE PAST 3 MONTHS   Final  Admission on 03/01/2015, Discharged on 03/02/2015  Component Date Value Ref Range Status  . Sodium 03/01/2015 141  135 - 145 mmol/L Final  . Potassium 03/01/2015 3.2* 3.5 - 5.1 mmol/L Final  . Chloride 03/01/2015 107  101 - 111 mmol/L Final  . CO2 03/01/2015 23   22 - 32 mmol/L Final  . Glucose, Bld 03/01/2015 111* 65 - 99 mg/dL Final  . BUN 03/01/2015 18  6 - 20 mg/dL Final  . Creatinine, Ser 03/01/2015 0.89  0.44 - 1.00 mg/dL Final  . Calcium 03/01/2015 9.1  8.9 - 10.3 mg/dL Final  . GFR calc non Af Amer 03/01/2015 57* >60 mL/min Final  . GFR calc Af Amer 03/01/2015 >60  >60 mL/min Final   Comment: (NOTE) The eGFR has been calculated using the CKD EPI equation. This calculation has not been validated in all clinical situations. eGFR's persistently <60 mL/min signify possible Chronic Kidney Disease.   . Anion gap 03/01/2015 11  5 - 15 Final  . WBC 03/01/2015 10.1  4.0 - 10.5 K/uL Final  . RBC 03/01/2015 3.31* 3.87 - 5.11 MIL/uL Final  . Hemoglobin 03/01/2015 9.9* 12.0 - 15.0 g/dL Final  . HCT 03/01/2015 30.7* 36.0 - 46.0 % Final  . MCV 03/01/2015 92.7  78.0 - 100.0 fL Final  . MCH 03/01/2015 29.9  26.0 - 34.0 pg Final  . MCHC 03/01/2015 32.2  30.0 - 36.0 g/dL Final  . RDW 03/01/2015 14.7  11.5 - 15.5 % Final  . Platelets 03/01/2015 474* 150 - 400 K/uL Final  . Neutrophils Relative % 03/01/2015 78   Final  . Neutro Abs 03/01/2015 7.9* 1.7 - 7.7 K/uL Final  . Lymphocytes Relative 03/01/2015 13   Final  . Lymphs Abs 03/01/2015 1.3  0.7 - 4.0 K/uL Final  . Monocytes Relative 03/01/2015 8   Final  . Monocytes Absolute 03/01/2015 0.8  0.1 - 1.0 K/uL Final  . Eosinophils Relative 03/01/2015 1   Final  . Eosinophils Absolute 03/01/2015 0.1  0.0 - 0.7 K/uL Final  . Basophils Relative 03/01/2015 0   Final  . Basophils Absolute 03/01/2015 0.0  0.0 - 0.1 K/uL Final  . Sodium 03/02/2015 139  135 - 145 mmol/L Final  . Potassium 03/02/2015 3.1* 3.5 - 5.1 mmol/L Final  . Chloride 03/02/2015 106  101 - 111 mmol/L Final  . CO2 03/02/2015 25  22 - 32 mmol/L Final  . Glucose, Bld 03/02/2015 102* 65 - 99 mg/dL Final  . BUN 03/02/2015 19  6 - 20 mg/dL Final  . Creatinine, Ser 03/02/2015 0.77  0.44 - 1.00 mg/dL Final  . Calcium 03/02/2015 8.6* 8.9 - 10.3  mg/dL Final  . Total Protein 03/02/2015 5.7* 6.5 - 8.1 g/dL Final  . Albumin  03/02/2015 2.7* 3.5 - 5.0 g/dL Final  . AST 03/02/2015 19  15 - 41 U/L Final  . ALT 03/02/2015 12* 14 - 54 U/L Final  . Alkaline Phosphatase 03/02/2015 83  38 - 126 U/L Final  . Total Bilirubin 03/02/2015 1.0  0.3 - 1.2 mg/dL Final  . GFR calc non Af Amer 03/02/2015 >60  >60 mL/min Final  . GFR calc Af Amer 03/02/2015 >60  >60 mL/min Final   Comment: (NOTE) The eGFR has been calculated using the CKD EPI equation. This calculation has not been validated in all clinical situations. eGFR's persistently <60 mL/min signify possible Chronic Kidney Disease.   . Anion gap 03/02/2015 8  5 - 15 Final  . Prothrombin Time 03/02/2015 14.9  11.6 - 15.2 seconds Final  . INR 03/02/2015 1.15  0.00 - 1.49 Final  . WBC 03/02/2015 7.0  4.0 - 10.5 K/uL Final  . RBC 03/02/2015 3.19* 3.87 - 5.11 MIL/uL Final  . Hemoglobin 03/02/2015 9.2* 12.0 - 15.0 g/dL Final  . HCT 03/02/2015 29.1* 36.0 - 46.0 % Final  . MCV 03/02/2015 91.2  78.0 - 100.0 fL Final  . MCH 03/02/2015 28.8  26.0 - 34.0 pg Final  . MCHC 03/02/2015 31.6  30.0 - 36.0 g/dL Final  . RDW 03/02/2015 14.5  11.5 - 15.5 % Final  . Platelets 03/02/2015 414* 150 - 400 K/uL Final  . Neutrophils Relative % 03/02/2015 61   Final  . Neutro Abs 03/02/2015 4.3  1.7 - 7.7 K/uL Final  . Lymphocytes Relative 03/02/2015 26   Final  . Lymphs Abs 03/02/2015 1.8  0.7 - 4.0 K/uL Final  . Monocytes Relative 03/02/2015 10   Final  . Monocytes Absolute 03/02/2015 0.7  0.1 - 1.0 K/uL Final  . Eosinophils Relative 03/02/2015 2   Final  . Eosinophils Absolute 03/02/2015 0.2  0.0 - 0.7 K/uL Final  . Basophils Relative 03/02/2015 1   Final  . Basophils Absolute 03/02/2015 0.0  0.0 - 0.1 K/uL Final     X-Rays:Dg Chest 2 View  04/17/2015  CLINICAL DATA:  Preoperative total knee arthroplasty.  Hypertension. EXAM: CHEST  2 VIEW COMPARISON:  March 29, 2015 FINDINGS: There is no edema or  consolidation. The heart size and pulmonary vascularity are normal. No adenopathy. There is mild degenerative change in the thoracic spine. IMPRESSION: No edema or consolidation. Electronically Signed   By: Lowella Grip III M.D.   On: 04/17/2015 13:54   Dg Chest 2 View  03/29/2015  CLINICAL DATA:  82 year old who had an unwitnessed fall at the nursing home. Recent right total knee arthroplasty 03/15/2015. As a result the fall, there is dehiscence of the knee incision. Patient also complains of right hip pain. EXAM: CHEST  1 VIEW COMPARISON:  04/25/2014 and earlier. FINDINGS: AP supine examination was obtained. Suboptimal inspiration. Cardiac silhouette mildly enlarged, unchanged allowing for differences in technique. Lungs clear. Bronchovascular markings normal. Pulmonary vascularity normal. No visible pleural effusions. No pneumothorax. Prominent costal cartilage calcifications throughout. IMPRESSION: Suboptimal inspiration. No acute cardiopulmonary disease. Stable mild cardiomegaly without pulmonary edema. Electronically Signed   By: Evangeline Dakin M.D.   On: 03/29/2015 16:09   Ct Head Wo Contrast  04/23/2015  CLINICAL DATA:  Altered mental status. History of previous CVA. Cognitive decline for 2 months. Multiple falls resulting in dehiscence of RIGHT total knee arthroplasty incision, requiring surgery. EXAM: CT HEAD WITHOUT CONTRAST TECHNIQUE: Contiguous axial images were obtained from the base of the skull through the vertex  without intravenous contrast. COMPARISON:  03/29/2015 most recent CT.  Prior MR head 02/28/2013. FINDINGS: Generalized cerebral and cerebellar atrophy, moderately advanced for age. Hypoattenuation of white matter, likely chronic microvascular ischemic change. No evidence for acute stroke, acute hemorrhage, mass lesion, or extra-axial fluid. Hydrocephalus ex vacuo. Remote RIGHT occipital infarct with encephalomalacia; this infarct was acute in 2014. Vascular calcification in the  carotid siphon and vertebral regions. Transverse arch calcification at C1-2. There is layering fluid in the sphenoid sinus, which was present on the prior most recent scan from 03/29/2015. No signs of basilar skull fracture. BILATERAL cataract extraction. No mastoid or middle ear fluid. Tiny RIGHT frontal scalp calcification. No significant scalp hematoma or laceration. RIGHT parietal sebaceous cyst. IMPRESSION: Chronic changes as described. No skull fracture or intracranial hemorrhage. No acute intracranial findings are seen which might contribute to altered mental status. Electronically Signed   By: Staci Righter M.D.   On: 04/23/2015 13:43   Ct Head Wo Contrast  03/29/2015  CLINICAL DATA:  Unwitnessed fall.  Dementia. EXAM: CT HEAD WITHOUT CONTRAST CT CERVICAL SPINE WITHOUT CONTRAST TECHNIQUE: Multidetector CT imaging of the head and cervical spine was performed following the standard protocol without intravenous contrast. Multiplanar CT image reconstructions of the cervical spine were also generated. COMPARISON:  03/15/2015 FINDINGS: CT HEAD FINDINGS Remote right posterior cerebral artery distribution infarct. Mild chronic ischemic microvascular white matter disease. Age-appropriate atrophy. No intracranial hemorrhage, mass lesion, or acute CVA. Cutaneous lesion along the right scalp similar to prior, possibly a sebaceous cyst. Chronic right maxillary sinusitis. Acute left sphenoid sinusitis with frothy fluid and in internal calcifications similar to prior. CT CERVICAL SPINE FINDINGS Transverse ligament calcification and C1 with spurring and C1-2. 1.5 mm degenerative retrolisthesis at C5-6. Loss of disc height at all levels between C3 and C6 with associated posterior osseous ridging. No cervical spine fracture or subluxation is identified. Uncinate and facet spurring cause osseous foraminal stenosis on the right at C3-4, C4-5, and C5-6; and on the left at C3-4, and C4-5. Aortic arch atherosclerosis noted.  IMPRESSION: 1. No acute intracranial findings or acute cervical spine findings. 2. There is acute left sphenoid sinusitis similar to prior, along with chronic right maxillary sinusitis. 3. Cervical spondylosis and degenerative disc disease causing multilevel bony foraminal impingement. 4. Remote right PCA distribution infarct. Periventricular white matter and corona radiata hypodensities favor chronic ischemic microvascular white matter disease. Electronically Signed   By: Van Clines M.D.   On: 03/29/2015 16:48   Ct Cervical Spine Wo Contrast  03/29/2015  CLINICAL DATA:  Unwitnessed fall.  Dementia. EXAM: CT HEAD WITHOUT CONTRAST CT CERVICAL SPINE WITHOUT CONTRAST TECHNIQUE: Multidetector CT imaging of the head and cervical spine was performed following the standard protocol without intravenous contrast. Multiplanar CT image reconstructions of the cervical spine were also generated. COMPARISON:  03/15/2015 FINDINGS: CT HEAD FINDINGS Remote right posterior cerebral artery distribution infarct. Mild chronic ischemic microvascular white matter disease. Age-appropriate atrophy. No intracranial hemorrhage, mass lesion, or acute CVA. Cutaneous lesion along the right scalp similar to prior, possibly a sebaceous cyst. Chronic right maxillary sinusitis. Acute left sphenoid sinusitis with frothy fluid and in internal calcifications similar to prior. CT CERVICAL SPINE FINDINGS Transverse ligament calcification and C1 with spurring and C1-2. 1.5 mm degenerative retrolisthesis at C5-6. Loss of disc height at all levels between C3 and C6 with associated posterior osseous ridging. No cervical spine fracture or subluxation is identified. Uncinate and facet spurring cause osseous foraminal stenosis on the right at C3-4,  C4-5, and C5-6; and on the left at C3-4, and C4-5. Aortic arch atherosclerosis noted. IMPRESSION: 1. No acute intracranial findings or acute cervical spine findings. 2. There is acute left sphenoid  sinusitis similar to prior, along with chronic right maxillary sinusitis. 3. Cervical spondylosis and degenerative disc disease causing multilevel bony foraminal impingement. 4. Remote right PCA distribution infarct. Periventricular white matter and corona radiata hypodensities favor chronic ischemic microvascular white matter disease. Electronically Signed   By: Van Clines M.D.   On: 03/29/2015 16:48   Dg Knee Complete 4 Views Right  03/29/2015  CLINICAL DATA:  Recent fall with right knee pain, initial encounter EXAM: RIGHT KNEE - COMPLETE 4+ VIEW COMPARISON:  03/15/2015 FINDINGS: There are changes consistent with a right knee arthroplasty. No loosening or acute prostatic abnormality is seen. No joint effusion is noted. No soft tissue changes are seen. IMPRESSION: Postoperative change without acute abnormality. Electronically Signed   By: Inez Catalina M.D.   On: 03/29/2015 16:06   Dg Hip Unilat  With Pelvis 2-3 Views Right  03/29/2015  CLINICAL DATA:  Recent fall with right hip and right knee pain, initial encounter EXAM: DG HIP (WITH OR WITHOUT PELVIS) 2-3V RIGHT COMPARISON:  None. FINDINGS: The pelvic ring is intact. Multiple phleboliths are seen. Postsurgical changes are noted in the pelvis. No acute fracture or dislocation is noted. No soft tissue changes are seen. IMPRESSION: No acute abnormality noted. Electronically Signed   By: Inez Catalina M.D.   On: 03/29/2015 16:04    EKG: Orders placed or performed in visit on 01/13/15  . EKG 12-Lead     Hospital Course: Kristin Wilkerson is a 82 y.o. who was admitted to [facility]. They were brought to the operating room on 04/21/2015 and underwent Procedure(s): IRRIGATION AND DEBRIDEMENT RIGHT KNEE WITH PATELLECTOMY AND REPAIR OF COMPLEX OPEN WOUND.  Patient tolerated the procedure well and was later transferred to the recovery room and then to the orthopaedic floor for postoperative care.  They were given PO and IV analgesics for pain  control following their surgery.  They were given 24 hours of postoperative antibiotics of  Anti-infectives    Start     Dose/Rate Route Frequency Ordered Stop   04/24/15 0000  doxycycline (VIBRA-TABS) 100 MG tablet     100 mg Oral 2 times daily 04/24/15 0655     04/21/15 2000  ceFAZolin (ANCEF) IVPB 1 g/50 mL premix     1 g 100 mL/hr over 30 Minutes Intravenous Every 6 hours 04/21/15 1637 04/22/15 0759   04/21/15 1341  polymyxin B 500,000 Units, bacitracin 50,000 Units in sodium chloride irrigation 0.9 % 500 mL irrigation  Status:  Discontinued       As needed 04/21/15 1341 04/21/15 1434   04/21/15 1104  ceFAZolin (ANCEF) IVPB 2 g/50 mL premix     2 g 100 mL/hr over 30 Minutes Intravenous On call to O.R. 04/21/15 1104 04/21/15 1313     and started on DVT prophylaxis in the form of Xarelto.   PT and OT were ordered.  Discharge planning consulted to help with postop disposition and equipment needs.  Patient had a fair night on the evening of surgery.  Hemovac drain was pulled without difficulty.  Neurology consulted on teh patient as there was some concern for worsening confusion. Postoperative confusion found to be secondary to already diagnosed dementia.   Dressing was changed on day three and the incision was stable with moderate serosanguinous drainage .  Patient was seen in rounds and was ready to go to SNF on aspirin for DVT prophylaxis, doxycycline for abx, and continuation of foley catheter.    Diet: Cardiac diet Activity:bed to chair only Follow-up:in 3 days Disposition - Skilled nursing facility Discharged Condition: stable   Discharge Instructions    Call MD / Call 911    Complete by:  As directed   If you experience chest pain or shortness of breath, CALL 911 and be transported to the hospital emergency room.  If you develope a fever above 101 F, pus (white drainage) or increased drainage or redness at the wound, or calf pain, call your surgeon's office.     Constipation  Prevention    Complete by:  As directed   Drink plenty of fluids.  Prune juice may be helpful.  You may use a stool softener, such as Colace (over the counter) 100 mg twice a day.  Use MiraLax (over the counter) for constipation as needed.     Diet - low sodium heart healthy    Complete by:  As directed      Increase activity slowly as tolerated    Complete by:  As directed             Medication List    TAKE these medications        acetaminophen 500 MG tablet  Commonly known as:  TYLENOL  Take 1,000 mg by mouth 3 (three) times daily.     atorvastatin 20 MG tablet  Commonly known as:  LIPITOR  TAKE 1 TABLET BY MOUTH EVERY EVENING TO CONTROL CHOLESTEROL     Calcium Carbonate-Vitamin D 600-400 MG-UNIT tablet  Commonly known as:  CALCIUM 600+D  Take 1 tablet by mouth daily.     cetirizine 10 MG tablet  Commonly known as:  ZYRTEC  Take 10 mg by mouth daily.     cholecalciferol 1000 units tablet  Commonly known as:  VITAMIN D  Take 1,000 Units by mouth daily at 8 pm.     doxycycline 100 MG tablet  Commonly known as:  VIBRA-TABS  Take 1 tablet (100 mg total) by mouth 2 (two) times daily.     DULoxetine 30 MG capsule  Commonly known as:  CYMBALTA  Take 1 capsule (30 mg total) by mouth daily. One daily to help nerves     furosemide 20 MG tablet  Commonly known as:  LASIX  Take 1 tablet (20 mg total) by mouth every other day.     hydrocortisone 2.5 % rectal cream  Commonly known as:  ANUSOL-HC  Place 1 application rectally 2 (two) times daily as needed for hemorrhoids or itching.     latanoprost 0.005 % ophthalmic solution  Commonly known as:  XALATAN  PLACE 1 DROP IN BOTH EYES AT BEDTIME TO TREAT GLAUCOMA     losartan 100 MG tablet  Commonly known as:  COZAAR  TAKE 1 TABLET BY MOUTH ONCE DAILY FOR BLOOD PRESSURE     methocarbamol 500 MG tablet  Commonly known as:  ROBAXIN  Take 1 tablet (500 mg total) by mouth every 6 (six) hours as needed for muscle spasms.       metoprolol 100 MG tablet  Commonly known as:  LOPRESSOR  TAKE 1 TABLET BY MOUTH TWICE DAILY TO CONTORL BLOOD PRESSURE     NUTRITIONAL DRINK Liqd  Take 90 mLs by mouth 2 (two) times daily. *Medpass*     polyethylene glycol packet  Commonly known as:  MIRALAX / GLYCOLAX  Take 17 g by mouth 2 (two) times daily.     PROCEL 100 Powd  Take 2 scoop by mouth 2 (two) times daily.     sennosides-docusate sodium 8.6-50 MG tablet  Commonly known as:  SENOKOT-S  Take 2 tablets by mouth 2 (two) times daily.     traMADol 50 MG tablet  Commonly known as:  ULTRAM  Take 1-2 tablets (50-100 mg total) by mouth every 6 (six) hours as needed for moderate pain.           Follow-up Information    Follow up with [facility].   Specialty:  Skilled Nursing Facility   Contact information:   [address] [place] [phone]      Follow up with Latanya Maudlin A, MD. Schedule an appointment as soon as possible for a visit on 04/27/2015.   Specialty:  Orthopedic Surgery   Contact information:   [address] [place] [address] [phone]       Signed: Ardeen Jourdain, PA-C Orthopaedic Surgery 04/24/2015, 7:15 AM

## 2015-04-24 NOTE — Clinical Social Work Placement (Signed)
   CLINICAL SOCIAL WORK PLACEMENT  NOTE  Date:  04/24/2015  Patient Details  Name: Kristin Wilkerson MRN: [id number] Date of Birth: 10-Jun-1925  Clinical Social Work is seeking post-discharge placement for this patient at the Skilled  Nursing Facility level of care (*CSW will initial, date and re-position this form in  chart as items are completed):  No   Patient/family provided with [facility] Clinical [facility] Work Department's list of facilities offering this level of care within the geographic area requested by the patient (or if unable, by the patient's family).  Yes   Patient/family informed of their freedom to choose among providers that offer the needed level of care, that participate in Medicare, Medicaid or managed care program needed by the patient, have an available bed and are willing to accept the patient.  No   Patient/family informed of [facility]'s ownership interest in [facility] and [facility], as well as of the fact that they are under no obligation to receive care at these facilities.  PASRR submitted to EDS on       PASRR number received on       Existing PASRR number confirmed on 04/22/15     FL2 transmitted to all facilities in geographic area requested by pt/family on 04/22/15     FL2 transmitted to all facilities within larger geographic area on       Patient informed that his/her managed care company has contracts with or will negotiate with certain facilities, including the following:        Yes   Patient/family informed of bed offers received.  [facility]     Physician recommends and patient chooses bed at      Patient to be [facility] on 04/24/15.  Patient to be transferred to facility by [facility]     Patient family notified on 04/24/15 of transfer.  Name of family member notified:  DAUGHTER     PHYSICIAN       Additional Comment: Pt / daughter are in agreement with d/c to [facility]  today. [facility] transport is needed. Daughter is aware out of pocket costs may be associated with [facility] transport. D/C Summary, scripts, avs reviewed by nsg. Scripts included in d/c packet. D/C Summary sent to SNF for review prior to d/c.   _______________________________________________ Royetta AsalHaidinger, Sherylann Vangorden Lee, LCSW  [phone] 04/24/2015, 1:15 PM

## 2015-04-24 NOTE — Progress Notes (Signed)
Subjective: 3 Days Post-Op Procedure(s) (LRB): IRRIGATION AND DEBRIDEMENT RIGHT KNEE WITH PATELLECTOMY AND REPAIR OF COMPLEX OPEN WOUND (Right) Patient reports pain as 1 on 0-10 scale.Dressing changed and wound looks excellent. Will DC to SNF I discussed her case with the Neurologist after he evaluated her and he agrees on outpatient follow-Up. No further RX needed at this time. All Cultures show no growth.   Objective: Vital signs in last 24 hours: Temp:  [97.8 F (36.6 C)-98.1 F (36.7 C)] 98.1 F (36.7 C) (01/06 0700) Pulse Rate:  [71-78] 72 (01/06 0700) Resp:  [18-20] 20 (01/06 0700) BP: (114-144)/(56-71) 144/57 mmHg (01/06 0700) SpO2:  [96 %-100 %] 96 % (01/06 0700)  Intake/Output from previous day: 01/05 0701 - 01/06 0700 In: 1615 [P.O.:600; I.V.:1015] Out: 1600 [Urine:1600] Intake/Output this shift:     Recent Labs  04/22/15 0500 04/23/15 0530 04/24/15 0435  HGB 8.8* 8.8* 8.4*    Recent Labs  04/23/15 0530 04/24/15 0435  WBC 6.1 6.1  RBC 3.10* 2.95*  HCT 28.5* 26.8*  PLT 381 364    Recent Labs  04/22/15 0500 04/23/15 0530  NA 141 141  K 3.9 3.7  CL 106 107  CO2 26 27  BUN 12 9  CREATININE 0.78 0.78  GLUCOSE 99 99  CALCIUM 8.6* 8.5*   No results for input(s): LABPT, INR in the last 72 hours.  Dorsiflexion/Plantar flexion intact No cellulitis present Compartment soft  Assessment/Plan: 3 Days Post-Op Procedure(s) (LRB): IRRIGATION AND DEBRIDEMENT RIGHT KNEE WITH PATELLECTOMY AND REPAIR OF COMPLEX OPEN WOUND (Right) Discharge to SNF  Kristin Wilkerson A 04/24/2015, 7:18 AM

## 2015-04-24 NOTE — Care Management Note (Signed)
Case Management Note  Patient Details  Name: Kristin Wilkerson MRN: [id number] Date of Birth: Jul 13, 1925  Subjective/Objective:                    Action/Plan:d/c snf   Expected Discharge Date:                 Expected Discharge Plan:  Skilled Nursing Facility  In-House Referral:  Clinical Social Work  Discharge planning Services  CM Consult  Post Acute Care Choice:    Choice offered to:     DME Arranged:    DME Agency:     HH Arranged:    HH Agency:     Status of Service:  Completed, signed off  Medicare Important Message Given:    Date Medicare IM Given:    Medicare IM give by:    Date Additional Medicare IM Given:    Additional Medicare Important Message give by:     If discussed at Long Length of Stay Meetings, dates discussed:    Additional Comments:  Lanier ClamMahabir, Yago Ludvigsen, RN 04/24/2015, 11:56 AM

## 2015-04-24 NOTE — Care Management Important Message (Signed)
Important Message  Patient Details  Name: Kristin Wilkerson MRN: [id number] Date of Birth: 09-Mar-1926   Medicare Important Message Given:  Yes    Haskell FlirtJamison, Jalayla Chrismer 04/24/2015, 11:57 AMImportant Message  Patient Details  Name: Kristin Wilkerson MRN: [id number] Date of Birth: 09-Mar-1926   Medicare Important Message Given:  Yes    Haskell FlirtJamison, Tyrese Ficek 04/24/2015, 11:57 AM

## 2015-04-26 LAB — ANAEROBIC CULTURE

## 2015-04-27 ENCOUNTER — Telehealth: Payer: Self-pay

## 2015-04-27 NOTE — Telephone Encounter (Signed)
LVM on home phone listed (per DPR) advising her that [facility] is closed tomorrow 04/28/15 until 10am and her appt will need to be rescheduled, asked her to please call back on Tuesday after 10am to reschedule. Gave clinic phone number. Mobile number listed is not in service.

## 2015-04-27 NOTE — Telephone Encounter (Signed)
Called pts son, Kristin Wilkerson, per DPR at [phone], but the number rings several times and then disconnects. I attempted twice to call this number to advise pt's son that [facility] is closed tomorrow 04/28/15 until 10 am and pts appt will need to be rescheduled.

## 2015-04-28 ENCOUNTER — Ambulatory Visit: Payer: Self-pay | Admitting: Adult Health

## 2015-05-01 ENCOUNTER — Encounter: Payer: Self-pay | Admitting: *Deleted

## 2015-05-01 ENCOUNTER — Encounter: Payer: Self-pay | Admitting: Neurology

## 2015-05-01 ENCOUNTER — Ambulatory Visit (INDEPENDENT_AMBULATORY_CARE_PROVIDER_SITE_OTHER): Payer: Medicare Other | Admitting: Neurology

## 2015-05-01 VITALS — BP 124/63 | HR 66 | Resp 20

## 2015-05-01 DIAGNOSIS — M25561 Pain in right knee: Secondary | ICD-10-CM | POA: Diagnosis not present

## 2015-05-01 DIAGNOSIS — F0391 Unspecified dementia with behavioral disturbance: Secondary | ICD-10-CM

## 2015-05-01 DIAGNOSIS — F331 Major depressive disorder, recurrent, moderate: Secondary | ICD-10-CM | POA: Diagnosis not present

## 2015-05-01 DIAGNOSIS — F0390 Unspecified dementia without behavioral disturbance: Secondary | ICD-10-CM | POA: Insufficient documentation

## 2015-05-01 DIAGNOSIS — F03918 Unspecified dementia, unspecified severity, with other behavioral disturbance: Secondary | ICD-10-CM

## 2015-05-01 MED ORDER — QUETIAPINE FUMARATE 25 MG PO TABS: ORAL_TABLET | ORAL | Status: DC

## 2015-05-01 MED ORDER — DULOXETINE HCL 60 MG PO CPEP
60.0000 mg | ORAL_CAPSULE | Freq: Every day | ORAL | Status: AC
Start: 2015-05-01 — End: ?

## 2015-05-01 NOTE — Progress Notes (Signed)
PATIENT: Kristin Wilkerson DOB: [birth year]  Chief Complaint  Patient presents with  . New Patient (Initial Visit)  . Dementia    pt's family requesting a memory exam  . Fall    pt fell 3 times in 2 months     HISTORICAL  Kristin Wilkerson is 82 years old right-handed female, accompanied by her daughter, and son at today's clinical visit, she was a patient of Dr. Pearlean Brownie for stroke, last clinical visit was in April 2016  She had a history of hypertension, hyperlipidemia, suffered a right occipital stroke in November 2014, patient was on aspirin 81 mg daily when the stroke happened, I have personally reviewed MRI of brain in November 2014, right occipital acute DWI relation, moderate generalized atrophy, MRA of the brain showed some irregularity at intra-cranial vessels, related intracranial atherosclerotic disease, no large vessel disease. Echocardiogram showed ejection fraction 55-60%, no cardioembolic source, ultrasound of carotid artery showed 1-39% bilateral internal carotid artery stenosis. She was discharged on Plavix,  She used to live at home, has mild baseline memory trouble, but still highly functional, she underwent right knee replacement in February 11 2015, was discharged to [facility] rehabilitation, she fell multiple times, injured her right knee, has to second right knee surgery in January fifth 2017, she is now at different facility[facility] [place], she is no longer ambulatory, wheelchair bound.   During the process, she was found to have increased confusion, anxiety, develop visual hallucinations, she saw her sister who passed away 5 years ago, double was sending text message to her, she has been taking Cymbalta to 30 mg daily for many years, she seems to respond  to low-dose Xanax for a while, her hospital course was also complicated by UTI, pneumonia.  Her sons are in the process of finalizing her power of attorney paperwork, she has DO NOT RESUSCITATE order on file,  she has good appetite, increased agitation confusion and hallucination at evening time.  REVIEW OF SYSTEMS: Full 14 system review of systems performed and notable only for anxiety  ALLERGIES: No Known Allergies  HOME MEDICATIONS: Current Outpatient Prescriptions  Medication Sig Dispense Refill  . acetaminophen (TYLENOL) 500 MG tablet Take 1,000 mg by mouth 3 (three) times daily.    . Amino Acids-Protein Hydrolys (FEEDING SUPPLEMENT, PRO-STAT SUGAR FREE 64,) LIQD Take 30 mLs by mouth daily.    [name] aspirin 325 MG tablet Take 325 mg by mouth daily.    [name] atorvastatin (LIPITOR) 20 MG tablet TAKE 1 TABLET BY MOUTH EVERY EVENING TO CONTROL CHOLESTEROL 90 tablet 1  . Calcium Carbonate-Vitamin D (CALCIUM 600+D) 600-400 MG-UNIT tablet Take 1 tablet by mouth daily. 30 tablet 5  . cholecalciferol (VITAMIN D) 1000 UNITS tablet Take 1,000 Units by mouth daily at 8 pm.     . doxycycline (VIBRA-TABS) 100 MG tablet Take 1 tablet (100 mg total) by mouth 2 (two) times daily. 20 tablet 0  . DULoxetine (CYMBALTA) 60 MG capsule Take 1 capsule (60 mg total) by mouth daily. One daily to help nerves 30 capsule 6  . furosemide (LASIX) 20 MG tablet Take 1 tablet (20 mg total) by mouth every other day. 30 tablet 5  . hydrocortisone (ANUSOL-HC) 2.5 % rectal cream Place 1 application rectally 2 (two) times daily as needed for hemorrhoids or itching.    . latanoprost (XALATAN) 0.005 % ophthalmic solution PLACE 1 DROP IN BOTH EYES AT BEDTIME TO TREAT GLAUCOMA 7.5 mL 2  . losartan (COZAAR) 100  MG tablet TAKE 1 TABLET BY MOUTH ONCE DAILY FOR BLOOD PRESSURE 90 tablet 1  . metoprolol (LOPRESSOR) 100 MG tablet TAKE 1 TABLET BY MOUTH TWICE DAILY TO CONTORL BLOOD PRESSURE 180 tablet 1  . Nutritional Supplements (NUTRITIONAL DRINK) LIQD Take 90 mLs by mouth 2 (two) times daily. *Medpass*    . polyethylene glycol (MIRALAX / GLYCOLAX) packet Take 17 g by mouth 2 (two) times daily.    . Protein (PROCEL 100) POWD Take 2 scoop by mouth 2  (two) times daily.    . sennosides-docusate sodium (SENOKOT-S) 8.6-50 MG tablet Take 2 tablets by mouth 2 (two) times daily.    . traMADol (ULTRAM) 50 MG tablet Take 1-2 tablets (50-100 mg total) by mouth every 6 (six) hours as needed for moderate pain. 60 tablet 0  . QUEtiapine (SEROQUEL) 25 MG tablet One tab po every night for one week, then 2 tabs po every night 60 tablet 6   No current facility-administered medications for this visit.    PAST MEDICAL HISTORY: Past Medical History  Diagnosis Date  . Hypertension   . Abnormality of gait   . Disturbance of skin sensation   . Tachycardia, unspecified   . Internal hemorrhoids without mention of complication   . Synovial cyst of popliteal space   . Spinal stenosis, other region   . Sciatica   . Lumbago   . Unspecified vitamin D deficiency   . Anxiety   . Hyperlipidemia   . Abnormality of gait   . Unspecified sinusitis (chronic)   . Unspecified pruritic disorder   . Pain in joint, lower leg   . Depressive disorder, not elsewhere classified   . Unspecified glaucoma   . Reflux esophagitis   . Diverticulosis of colon (without mention of hemorrhage)   . Pain in joint, site unspecified   . Myalgia and myositis, unspecified   . Osteoporosis, unspecified   . Other malaise and fatigue   . Cervicalgia   . Benign neoplasm of colon   . H/O echocardiogram 09/01/11    EF >55%, mild MR, RV systolic pressure elevated at 47-4230-40 mmHg, mod TR  . Normal cardiac stress test 11/16/09    low risk scan, EF  77%  . Shortness of breath   . Stroke [facility](HCC) "a couple of years ago"     visual problems left eye  . Arthritis   . Swelling of both ankles     rt leg  . Bruises easily   . Complication of anesthesia     SLOW TO AWAKEN MORE CONFUSION AFTER LAST SURGERY.WANTS SPINAL OR NERVE BLOCK IF POSSIBLE    PAST SURGICAL HISTORY: Past Surgical History  Procedure Laterality Date  . Lumbar laminectomy  12/04/2012    Complete decompressive L3-4 and  L4-5--Dr. [name]  . Abdominal hysterectomy    . Cholecystectomy    . Cardiovascular stress test  11/16/2009    Perfusion defect seen in inferior myocardial region consistent with diaphragmatic attenuation. Remaining myocardium demonstrates normalmyocardial perfusion with no evidence of ischemia or infarct. No ECG changes. EKG negative for ischemia.  . Transthoracic echocardiogram  09/01/2011    EF >55% and moderate tricuspid valve regurg.  . Bilateral oophorectomy    . Total knee arthroplasty Right 02/11/2015    Procedure: TOTAL KNEE ARTHROPLASTY;  Surgeon: Ranee Gosselinonald Gioffre, MD;  Location: [facility];  Service: Orthopedics;  Laterality: Right;  . I&d knee with poly exchange Right 04/21/2015    Procedure: IRRIGATION AND DEBRIDEMENT RIGHT KNEE WITH PATELLECTOMY AND  REPAIR OF COMPLEX OPEN WOUND;  Surgeon: Ranee Gosselin, MD;  Location: [facility];  Service: Orthopedics;  Laterality: Right;    FAMILY HISTORY: Family History  Problem Relation Age of Onset  . Cancer Sister   . Stroke Mother   . Stroke Father     SOCIAL HISTORY:  Social History   Social History  . Marital Status: Widowed    Spouse Name: N/A  . Number of Children: 3  . Years of Education: 12th   Occupational History  . retired    Social History Main Topics  . Smoking status: Never Smoker   . Smokeless tobacco: Never Used  . Alcohol Use: No  . Drug Use: No  . Sexual Activity: No   Other Topics Concern  . Not on file   Social History Narrative     PHYSICAL EXAM   Filed Vitals:   05/01/15 1300  BP: 124/63  Pulse: 66  Resp: 20    Not recorded      There is no Wilkerson on file to calculate BMI.  PHYSICAL EXAMNIATION:  Gen: NAD, conversant, well nourised, obese, well groomed                     Cardiovascular: Regular rate rhythm, no peripheral edema, warm, nontender. Eyes: Conjunctivae clear without exudates or hemorrhage Neck: Supple, no carotid bruise. Pulmonary: Clear to auscultation bilaterally    NEUROLOGICAL EXAM:  MENTAL STATUS: Speech:    Speech is normal; fluent and spontaneous with normal comprehension.  Cognition: Mini-Mental Status Examination is 18 out of 30     Orientation: She is not oriented to day, years, clinic, physician, [place],     Recent and remote memory: She missed 3 out of 3 recalls,     Normal Attention span and concentration: She has difficulty registration, not able to spell world backwards     Normal Language, naming, repeating,spontaneous speech     Fund of knowledge   CRANIAL NERVES: CN II: Visual fields are full to confrontation. Pupils are round equal and briskly reactive to light. CN III, IV, VI: extraocular movement are normal. No ptosis. CN V: Facial sensation is intact to pinprick in all 3 divisions bilaterally. Corneal responses are intact.  CN VII: Face is symmetric with normal eye closure and smile. CN VIII: Hearing is normal to rubbing fingers CN IX, X: Palate elevates symmetrically. Phonation is normal. CN XI: Head turning and shoulder shrug are intact CN XII: Tongue is midline with normal movements and no atrophy.  MOTOR: She sees in wheelchair, right lower extremity in brace, no significant bilateral upper extremity weakness, significant left lower extremity pitting edema, no weakness identified  REFLEXES: Hypoactive and symmetricand toes.  Sensory: Length dependent sensory changes at left lower extremity muscles, this is also complicated by her left lower extremity significant pitting edema  COORDINATION: Rapid alternating movements and fine finger movements are intact. There is no dysmetria on finger-to-nose   GAIT/STANCE:  Nonweight bearing  (LABS, IMAGING, TESTING) - I reviewed patient records, labs, notes, testing and imaging myself where available.   ASSESSMENT AND PLAN  Kristin Wilkerson is a 82 y.o. female    Dementia with behavior issues   patient already has baseline mild memory trouble, MRI and CAT scan showed  moderate atrophy,  Acute worsening in the setting of hospital admission, post operative period of time, UTI, and pneumonia  Stop Zyrtec, Robaxin  Try low dose seroquel 25 mg, titrating to 2 tablets every night  Also generated the letter to indicate her diagnosis of stroke, and moderate dementia  History of right occipital stroke   continue daily aspirin    Levert Feinstein, M.D. Ph.D.  [facility] [address] [place], [place] [address] Ph: [phone] Fax: [phone]  CC: Dr. Namon Cirri

## 2015-05-01 NOTE — Patient Instructions (Signed)
Stop Robaxin,  Zyrtec,   Increase Cymbalta from 30 to 60mg  daily.  Add on Seroquel 25mg  1 tab po every night for one week, then 2 tabs every night

## 2015-05-04 DIAGNOSIS — Z471 Aftercare following joint replacement surgery: Secondary | ICD-10-CM | POA: Diagnosis not present

## 2015-05-04 DIAGNOSIS — Z96651 Presence of right artificial knee joint: Secondary | ICD-10-CM | POA: Diagnosis not present

## 2015-05-07 ENCOUNTER — Inpatient Hospital Stay (HOSPITAL_COMMUNITY)
Admission: EM | Admit: 2015-05-07 | Discharge: 2015-05-09 | DRG: 176 | Disposition: A | Discharge status: Skilled Nursing Facility | Payer: Medicare Other | Attending: Internal Medicine | Admitting: Internal Medicine

## 2015-05-07 DIAGNOSIS — R55 Syncope and collapse: Secondary | ICD-10-CM | POA: Diagnosis present

## 2015-05-07 DIAGNOSIS — F419 Anxiety disorder, unspecified: Secondary | ICD-10-CM | POA: Diagnosis not present

## 2015-05-07 DIAGNOSIS — H409 Unspecified glaucoma: Secondary | ICD-10-CM | POA: Diagnosis present

## 2015-05-07 DIAGNOSIS — Z823 Family history of stroke: Secondary | ICD-10-CM

## 2015-05-07 DIAGNOSIS — J329 Chronic sinusitis, unspecified: Secondary | ICD-10-CM | POA: Diagnosis present

## 2015-05-07 DIAGNOSIS — I2699 Other pulmonary embolism without acute cor pulmonale: Principal | ICD-10-CM | POA: Diagnosis present

## 2015-05-07 DIAGNOSIS — Z7401 Bed confinement status: Secondary | ICD-10-CM

## 2015-05-07 DIAGNOSIS — Z66 Do not resuscitate: Secondary | ICD-10-CM | POA: Diagnosis present

## 2015-05-07 DIAGNOSIS — Z7982 Long term (current) use of aspirin: Secondary | ICD-10-CM

## 2015-05-07 DIAGNOSIS — Z79899 Other long term (current) drug therapy: Secondary | ICD-10-CM

## 2015-05-07 DIAGNOSIS — I5032 Chronic diastolic (congestive) heart failure: Secondary | ICD-10-CM | POA: Diagnosis present

## 2015-05-07 DIAGNOSIS — Z96651 Presence of right artificial knee joint: Secondary | ICD-10-CM | POA: Diagnosis present

## 2015-05-07 DIAGNOSIS — M545 Low back pain: Secondary | ICD-10-CM | POA: Diagnosis present

## 2015-05-07 DIAGNOSIS — Z515 Encounter for palliative care: Secondary | ICD-10-CM | POA: Insufficient documentation

## 2015-05-07 DIAGNOSIS — M542 Cervicalgia: Secondary | ICD-10-CM | POA: Diagnosis present

## 2015-05-07 DIAGNOSIS — F329 Major depressive disorder, single episode, unspecified: Secondary | ICD-10-CM | POA: Diagnosis not present

## 2015-05-07 DIAGNOSIS — D649 Anemia, unspecified: Secondary | ICD-10-CM | POA: Diagnosis present

## 2015-05-07 DIAGNOSIS — M5430 Sciatica, unspecified side: Secondary | ICD-10-CM | POA: Diagnosis present

## 2015-05-07 DIAGNOSIS — Z4789 Encounter for other orthopedic aftercare: Secondary | ICD-10-CM | POA: Diagnosis not present

## 2015-05-07 DIAGNOSIS — I10 Essential (primary) hypertension: Secondary | ICD-10-CM | POA: Diagnosis present

## 2015-05-07 DIAGNOSIS — M1990 Unspecified osteoarthritis, unspecified site: Secondary | ICD-10-CM | POA: Diagnosis present

## 2015-05-07 DIAGNOSIS — Z8673 Personal history of transient ischemic attack (TIA), and cerebral infarction without residual deficits: Secondary | ICD-10-CM

## 2015-05-07 DIAGNOSIS — Z993 Dependence on wheelchair: Secondary | ICD-10-CM

## 2015-05-07 DIAGNOSIS — F0391 Unspecified dementia with behavioral disturbance: Secondary | ICD-10-CM | POA: Diagnosis not present

## 2015-05-07 DIAGNOSIS — F0390 Unspecified dementia without behavioral disturbance: Secondary | ICD-10-CM | POA: Diagnosis present

## 2015-05-07 DIAGNOSIS — M810 Age-related osteoporosis without current pathological fracture: Secondary | ICD-10-CM | POA: Diagnosis present

## 2015-05-07 DIAGNOSIS — I071 Rheumatic tricuspid insufficiency: Secondary | ICD-10-CM | POA: Diagnosis present

## 2015-05-07 DIAGNOSIS — Z471 Aftercare following joint replacement surgery: Secondary | ICD-10-CM | POA: Diagnosis not present

## 2015-05-07 DIAGNOSIS — E785 Hyperlipidemia, unspecified: Secondary | ICD-10-CM | POA: Diagnosis present

## 2015-05-07 DIAGNOSIS — T8131XD Disruption of external operation (surgical) wound, not elsewhere classified, subsequent encounter: Secondary | ICD-10-CM | POA: Diagnosis not present

## 2015-05-07 DIAGNOSIS — M609 Myositis, unspecified: Secondary | ICD-10-CM | POA: Diagnosis present

## 2015-05-07 DIAGNOSIS — E559 Vitamin D deficiency, unspecified: Secondary | ICD-10-CM | POA: Diagnosis present

## 2015-05-07 DIAGNOSIS — Z809 Family history of malignant neoplasm, unspecified: Secondary | ICD-10-CM

## 2015-05-07 HISTORY — DX: Unspecified dementia, unspecified severity, without behavioral disturbance, psychotic disturbance, mood disturbance, and anxiety: F03.90

## 2015-05-07 HISTORY — DX: Heart failure, unspecified: I50.9

## 2015-05-07 HISTORY — DX: Anemia, unspecified: D64.9

## 2015-05-07 NOTE — ED Notes (Signed)
Bed: ZO10 Expected date:  Expected time:  Means of arrival:  Comments: 82 yo F  Syncopal episode

## 2015-05-07 NOTE — ED Notes (Signed)
Pt BIB EMS from [facility] Complaining of syncopal episode while having a bowel movement. Pt was caught by staff and never hit the floor. No report of hitting head. EMS reports hypotension on arrival (65/43), but normotensive upon arrival here (115/61). Hx dementia. CBG 146.

## 2015-05-08 ENCOUNTER — Emergency Department (HOSPITAL_COMMUNITY): Payer: Medicare Other

## 2015-05-08 ENCOUNTER — Encounter (HOSPITAL_COMMUNITY): Payer: Self-pay | Admitting: Emergency Medicine

## 2015-05-08 DIAGNOSIS — R278 Other lack of coordination: Secondary | ICD-10-CM | POA: Diagnosis not present

## 2015-05-08 DIAGNOSIS — E559 Vitamin D deficiency, unspecified: Secondary | ICD-10-CM | POA: Diagnosis present

## 2015-05-08 DIAGNOSIS — R262 Difficulty in walking, not elsewhere classified: Secondary | ICD-10-CM | POA: Diagnosis not present

## 2015-05-08 DIAGNOSIS — M6281 Muscle weakness (generalized): Secondary | ICD-10-CM | POA: Diagnosis not present

## 2015-05-08 DIAGNOSIS — I5032 Chronic diastolic (congestive) heart failure: Secondary | ICD-10-CM | POA: Diagnosis not present

## 2015-05-08 DIAGNOSIS — M609 Myositis, unspecified: Secondary | ICD-10-CM | POA: Diagnosis present

## 2015-05-08 DIAGNOSIS — F418 Other specified anxiety disorders: Secondary | ICD-10-CM

## 2015-05-08 DIAGNOSIS — F0391 Unspecified dementia with behavioral disturbance: Secondary | ICD-10-CM | POA: Diagnosis not present

## 2015-05-08 DIAGNOSIS — J329 Chronic sinusitis, unspecified: Secondary | ICD-10-CM | POA: Diagnosis present

## 2015-05-08 DIAGNOSIS — M545 Low back pain: Secondary | ICD-10-CM | POA: Diagnosis present

## 2015-05-08 DIAGNOSIS — E785 Hyperlipidemia, unspecified: Secondary | ICD-10-CM | POA: Diagnosis not present

## 2015-05-08 DIAGNOSIS — K219 Gastro-esophageal reflux disease without esophagitis: Secondary | ICD-10-CM | POA: Diagnosis not present

## 2015-05-08 DIAGNOSIS — T8131XD Disruption of external operation (surgical) wound, not elsewhere classified, subsequent encounter: Secondary | ICD-10-CM | POA: Diagnosis not present

## 2015-05-08 DIAGNOSIS — I10 Essential (primary) hypertension: Secondary | ICD-10-CM | POA: Diagnosis not present

## 2015-05-08 DIAGNOSIS — F419 Anxiety disorder, unspecified: Secondary | ICD-10-CM | POA: Diagnosis present

## 2015-05-08 DIAGNOSIS — D649 Anemia, unspecified: Secondary | ICD-10-CM | POA: Diagnosis present

## 2015-05-08 DIAGNOSIS — Z8673 Personal history of transient ischemic attack (TIA), and cerebral infarction without residual deficits: Secondary | ICD-10-CM | POA: Diagnosis not present

## 2015-05-08 DIAGNOSIS — Z9181 History of falling: Secondary | ICD-10-CM | POA: Diagnosis not present

## 2015-05-08 DIAGNOSIS — K648 Other hemorrhoids: Secondary | ICD-10-CM | POA: Diagnosis not present

## 2015-05-08 DIAGNOSIS — S76111D Strain of right quadriceps muscle, fascia and tendon, subsequent encounter: Secondary | ICD-10-CM | POA: Diagnosis not present

## 2015-05-08 DIAGNOSIS — J309 Allergic rhinitis, unspecified: Secondary | ICD-10-CM | POA: Diagnosis not present

## 2015-05-08 DIAGNOSIS — K5900 Constipation, unspecified: Secondary | ICD-10-CM | POA: Diagnosis not present

## 2015-05-08 DIAGNOSIS — F32A Depression, unspecified: Secondary | ICD-10-CM | POA: Diagnosis present

## 2015-05-08 DIAGNOSIS — R41841 Cognitive communication deficit: Secondary | ICD-10-CM | POA: Diagnosis not present

## 2015-05-08 DIAGNOSIS — L299 Pruritus, unspecified: Secondary | ICD-10-CM | POA: Diagnosis not present

## 2015-05-08 DIAGNOSIS — M542 Cervicalgia: Secondary | ICD-10-CM | POA: Diagnosis present

## 2015-05-08 DIAGNOSIS — Z7401 Bed confinement status: Secondary | ICD-10-CM | POA: Diagnosis not present

## 2015-05-08 DIAGNOSIS — Z96651 Presence of right artificial knee joint: Secondary | ICD-10-CM | POA: Diagnosis present

## 2015-05-08 DIAGNOSIS — Z823 Family history of stroke: Secondary | ICD-10-CM | POA: Diagnosis not present

## 2015-05-08 DIAGNOSIS — M810 Age-related osteoporosis without current pathological fracture: Secondary | ICD-10-CM | POA: Diagnosis present

## 2015-05-08 DIAGNOSIS — I071 Rheumatic tricuspid insufficiency: Secondary | ICD-10-CM | POA: Diagnosis present

## 2015-05-08 DIAGNOSIS — Z7982 Long term (current) use of aspirin: Secondary | ICD-10-CM | POA: Diagnosis not present

## 2015-05-08 DIAGNOSIS — F329 Major depressive disorder, single episode, unspecified: Secondary | ICD-10-CM | POA: Diagnosis present

## 2015-05-08 DIAGNOSIS — R29898 Other symptoms and signs involving the musculoskeletal system: Secondary | ICD-10-CM | POA: Diagnosis not present

## 2015-05-08 DIAGNOSIS — I2699 Other pulmonary embolism without acute cor pulmonale: Secondary | ICD-10-CM | POA: Diagnosis not present

## 2015-05-08 DIAGNOSIS — Z515 Encounter for palliative care: Secondary | ICD-10-CM | POA: Diagnosis not present

## 2015-05-08 DIAGNOSIS — Z66 Do not resuscitate: Secondary | ICD-10-CM | POA: Diagnosis not present

## 2015-05-08 DIAGNOSIS — Z993 Dependence on wheelchair: Secondary | ICD-10-CM | POA: Diagnosis not present

## 2015-05-08 DIAGNOSIS — F0390 Unspecified dementia without behavioral disturbance: Secondary | ICD-10-CM | POA: Diagnosis not present

## 2015-05-08 DIAGNOSIS — M1990 Unspecified osteoarthritis, unspecified site: Secondary | ICD-10-CM | POA: Diagnosis present

## 2015-05-08 DIAGNOSIS — R55 Syncope and collapse: Secondary | ICD-10-CM | POA: Diagnosis present

## 2015-05-08 DIAGNOSIS — Z79899 Other long term (current) drug therapy: Secondary | ICD-10-CM | POA: Diagnosis not present

## 2015-05-08 DIAGNOSIS — K5730 Diverticulosis of large intestine without perforation or abscess without bleeding: Secondary | ICD-10-CM | POA: Diagnosis not present

## 2015-05-08 DIAGNOSIS — M5430 Sciatica, unspecified side: Secondary | ICD-10-CM | POA: Diagnosis present

## 2015-05-08 DIAGNOSIS — H409 Unspecified glaucoma: Secondary | ICD-10-CM | POA: Diagnosis present

## 2015-05-08 DIAGNOSIS — Z809 Family history of malignant neoplasm, unspecified: Secondary | ICD-10-CM | POA: Diagnosis not present

## 2015-05-08 LAB — BASIC METABOLIC PANEL
ANION GAP: 11 (ref 5–15)
BUN: 25 mg/dL — AB (ref 6–20)
CALCIUM: 9 mg/dL (ref 8.9–10.3)
CO2: 23 mmol/L (ref 22–32)
Chloride: 107 mmol/L (ref 101–111)
Creatinine, Ser: 0.93 mg/dL (ref 0.44–1.00)
GFR calc Af Amer: >60 mL/min (ref >60)
GFR, EST NON AFRICAN AMERICAN: 54 mL/min — AB (ref >60)
GLUCOSE: 123 mg/dL — AB (ref 65–99)
Potassium: 3.8 mmol/L (ref 3.5–5.1)
SODIUM: 141 mmol/L (ref 135–145)

## 2015-05-08 LAB — URINALYSIS, ROUTINE W REFLEX MICROSCOPIC
Bilirubin Urine: NEGATIVE
GLUCOSE, UA: NEGATIVE mg/dL
HGB URINE DIPSTICK: NEGATIVE
KETONES UR: NEGATIVE mg/dL
Leukocytes, UA: NEGATIVE
Nitrite: NEGATIVE
PROTEIN: NEGATIVE mg/dL
Specific Gravity, Urine: 1.009 (ref 1.005–1.030)
pH: 6.5 (ref 5.0–8.0)

## 2015-05-08 LAB — CBC WITH DIFFERENTIAL/PLATELET
BASOS ABS: 0 10*3/uL (ref 0.0–0.1)
Basophils Relative: 0 %
Eosinophils Absolute: 0.2 10*3/uL (ref 0.0–0.7)
Eosinophils Relative: 2 %
HEMATOCRIT: 31.5 % — AB (ref 36.0–46.0)
HEMOGLOBIN: 10 g/dL — AB (ref 12.0–15.0)
LYMPHS PCT: 15 %
Lymphs Abs: 1.5 10*3/uL (ref 0.7–4.0)
MCH: 28.7 pg (ref 26.0–34.0)
MCHC: 31.7 g/dL (ref 30.0–36.0)
MCV: 90.5 fL (ref 78.0–100.0)
MONO ABS: 0.8 10*3/uL (ref 0.1–1.0)
MONOS PCT: 8 %
NEUTROS ABS: 7.4 10*3/uL (ref 1.7–7.7)
NEUTROS PCT: 75 %
Platelets: 384 10*3/uL (ref 150–400)
RBC: 3.48 MIL/uL — ABNORMAL LOW (ref 3.87–5.11)
RDW: 17.2 % — AB (ref 11.5–15.5)
WBC: 9.9 10*3/uL (ref 4.0–10.5)

## 2015-05-08 LAB — TROPONIN I

## 2015-05-08 LAB — PROTIME-INR
INR: 1.13 (ref 0.00–1.49)
PROTHROMBIN TIME: 14.7 s (ref 11.6–15.2)

## 2015-05-08 LAB — APTT: APTT: 31 s (ref 24–37)

## 2015-05-08 LAB — GLUCOSE, CAPILLARY: GLUCOSE-CAPILLARY: 102 mg/dL — AB (ref 65–99)

## 2015-05-08 LAB — HEPARIN LEVEL (UNFRACTIONATED): Heparin Unfractionated: 0.59 IU/mL (ref 0.30–0.70)

## 2015-05-08 MED ORDER — ONDANSETRON HCL 4 MG/2ML IJ SOLN: 4.0000 mg | Freq: Four times a day (QID) | INTRAMUSCULAR | Status: DC | PRN

## 2015-05-08 MED ORDER — ALPRAZOLAM 0.25 MG PO TABS
0.2500 mg | ORAL_TABLET | ORAL | Status: DC | PRN
Start: 2015-05-08 — End: 2015-05-09

## 2015-05-08 MED ORDER — SENNOSIDES-DOCUSATE SODIUM 8.6-50 MG PO TABS
2.0000 | ORAL_TABLET | Freq: Two times a day (BID) | ORAL | Status: DC
  Administered 2015-05-08 (×2): 2 via ORAL
  Filled 2015-05-08 (×2): qty 2

## 2015-05-08 MED ORDER — QUETIAPINE FUMARATE 25 MG PO TABS
25.0000 mg | ORAL_TABLET | Freq: Every day | ORAL | Status: DC
  Administered 2015-05-08: 25 mg via ORAL
  Filled 2015-05-08: qty 1

## 2015-05-08 MED ORDER — IOHEXOL 350 MG/ML SOLN
100.0000 mL | Freq: Once | INTRAVENOUS | Status: AC | PRN
  Administered 2015-05-08: 100 mL via INTRAVENOUS

## 2015-05-08 MED ORDER — VITAMIN D3 25 MCG (1000 UNIT) PO TABS
1000.0000 [IU] | ORAL_TABLET | Freq: Every day | ORAL | Status: DC
  Administered 2015-05-08: 1000 [IU] via ORAL
  Filled 2015-05-08 (×2): qty 1

## 2015-05-08 MED ORDER — SODIUM CHLORIDE 0.9 % IV SOLN: INTRAVENOUS | Status: AC

## 2015-05-08 MED ORDER — SODIUM CHLORIDE 0.9 % IV BOLUS (SEPSIS)
500.0000 mL | Freq: Once | INTRAVENOUS | Status: AC
  Administered 2015-05-08: 500 mL via INTRAVENOUS

## 2015-05-08 MED ORDER — SODIUM CHLORIDE 0.9 % IV SOLN
INTRAVENOUS | Status: DC
  Administered 2015-05-08: 09:00:00 via INTRAVENOUS

## 2015-05-08 MED ORDER — ACETAMINOPHEN 500 MG PO TABS
1000.0000 mg | ORAL_TABLET | Freq: Three times a day (TID) | ORAL | Status: DC
  Administered 2015-05-08 – 2015-05-09 (×4): 1000 mg via ORAL
  Filled 2015-05-08 (×5): qty 2

## 2015-05-08 MED ORDER — HEPARIN BOLUS VIA INFUSION
3000.0000 [IU] | Freq: Once | INTRAVENOUS | Status: AC
  Administered 2015-05-08: 3000 [IU] via INTRAVENOUS
  Filled 2015-05-08: qty 3000

## 2015-05-08 MED ORDER — HEPARIN (PORCINE) IN NACL 100-0.45 UNIT/ML-% IJ SOLN
750.0000 [IU]/h | INTRAMUSCULAR | Status: DC
  Administered 2015-05-08 – 2015-05-09 (×2): 1000 [IU]/h via INTRAVENOUS
  Filled 2015-05-08 (×3): qty 250

## 2015-05-08 MED ORDER — LATANOPROST 0.005 % OP SOLN
1.0000 [drp] | Freq: Every day | OPHTHALMIC | Status: DC
  Administered 2015-05-08: 1 [drp] via OPHTHALMIC
  Filled 2015-05-08 (×2): qty 2.5

## 2015-05-08 MED ORDER — POLYETHYLENE GLYCOL 3350 17 G PO PACK
17.0000 g | PACK | Freq: Two times a day (BID) | ORAL | Status: DC
  Administered 2015-05-08: 17 g via ORAL
  Filled 2015-05-08 (×4): qty 1

## 2015-05-08 MED ORDER — SODIUM CHLORIDE 0.9 % IJ SOLN: 3.0000 mL | Freq: Two times a day (BID) | INTRAMUSCULAR | Status: DC

## 2015-05-08 MED ORDER — METOPROLOL TARTRATE 50 MG PO TABS
100.0000 mg | ORAL_TABLET | Freq: Two times a day (BID) | ORAL | Status: DC
  Administered 2015-05-08 – 2015-05-09 (×3): 100 mg via ORAL
  Filled 2015-05-08: qty 4
  Filled 2015-05-08 (×2): qty 2

## 2015-05-08 MED ORDER — LOSARTAN POTASSIUM 50 MG PO TABS
100.0000 mg | ORAL_TABLET | Freq: Every day | ORAL | Status: DC
  Administered 2015-05-08 – 2015-05-09 (×2): 100 mg via ORAL
  Filled 2015-05-08 (×2): qty 2

## 2015-05-08 MED ORDER — DULOXETINE HCL 60 MG PO CPEP
60.0000 mg | ORAL_CAPSULE | Freq: Every day | ORAL | Status: DC
  Administered 2015-05-08 – 2015-05-09 (×2): 60 mg via ORAL
  Filled 2015-05-08 (×2): qty 1

## 2015-05-08 MED ORDER — HYDROCORTISONE 2.5 % RE CREA: 1.0000 "application " | TOPICAL_CREAM | Freq: Two times a day (BID) | RECTAL | Status: DC | PRN

## 2015-05-08 MED ORDER — FUROSEMIDE 20 MG PO TABS
20.0000 mg | ORAL_TABLET | ORAL | Status: DC
  Administered 2015-05-08: 20 mg via ORAL
  Filled 2015-05-08: qty 1

## 2015-05-08 MED ORDER — ONDANSETRON HCL 4 MG PO TABS: 4.0000 mg | ORAL_TABLET | Freq: Four times a day (QID) | ORAL | Status: DC | PRN

## 2015-05-08 MED ORDER — ATORVASTATIN CALCIUM 20 MG PO TABS
20.0000 mg | ORAL_TABLET | Freq: Every day | ORAL | Status: DC
  Administered 2015-05-08: 20 mg via ORAL
  Filled 2015-05-08 (×3): qty 1

## 2015-05-08 MED ORDER — HALOPERIDOL LACTATE 5 MG/ML IJ SOLN: 1.0000 mg | Freq: Once | INTRAMUSCULAR | Status: DC | PRN

## 2015-05-08 NOTE — Consult Note (Signed)
Consultation Note Date: 05/08/2015   Patient Name: Kristin Wilkerson  DOB: Jun 21, 1925  MRN: [id number]  Age / Sex: 82 y.o., female  PCP: Kimber Relic, MD Referring Physician: Alison Murray, MD  Reason for Consultation: Establishing goals of care    Clinical Assessment/Narrative: Nafeesa Dils is an 82 year old female with moderate dementia (diagnosed by Dr. Terrace Arabia in January 2017) sciatica, back pain, cervicalgia, history of stroke in 2014 and recent knee replacements in October 2016 and 04/23/2015. The patient currently resides at [facility]. She presented to [facility] ER after a syncopal episode that took place while having a bowel movement. She was found to have bilateral pulmonary emboli on CT angiogram chest. She has been admitted and is currently on a heparin drip.  Prior to admission she was taking a 325 mg aspirin. The patient is wheelchair/bed bound since her knee surgeries.   In reading Dr. Zannie Cove note of 05/01/15, the patient is a DNR/DNI. Her grandson Ethelene Browns, who is at bedside confirms DO NOT RESUSCITATE. The patient's daughter Eber Jones is her healthcare power of attorney, and her son is her financial power of attorney.  Ethelene Browns reports that his grandmother has declined significantly since the death of her husband 2 years ago. Her decline became more rapid after her knee surgery in October. She is frequently confused, unable to care for herself, and hallucinates in the evening.  She was recently started on Seroquel for the symptoms.  Contacts/Participants in Discussion: Patient and grandson Ethelene Browns Primary Decision Maker: Patient's son and daughter Relationship to Patient and patient's son and daughter HCPOA: Patient's daughter Eber Jones: [phone]   SUMMARY OF RECOMMENDATIONS  Will plan to meet with HCPOA on 1/21 for further GOC.  Code Status/Advance Care Planning: DNR    Code Status Orders        Start     Ordered   05/08/15 0808  Full code   Continuous     05/08/15 0807    Code Status History    Date Active Date Inactive Code Status Order ID Comments User Context   04/21/2015  4:51 PM 04/24/2015  5:34 PM DNR [id number]  Alfredo Batty, RN Inpatient   04/21/2015  4:37 PM 04/21/2015  4:50 PM Full Code [id number]  Ranee Gosselin, MD Inpatient   03/01/2015  4:11 PM 03/02/2015  6:34 PM Full Code [id number]  Dimitri Ped, PA-C ED   02/11/2015 12:07 PM 02/14/2015  2:27 PM Full Code [id number]  Ranee Gosselin, MD Inpatient   02/28/2013  9:08 PM 03/02/2013  3:17 PM Full Code [id number]  Hillary Bow, DO ED    Advance Directive Documentation        Most Recent Value   Type of Advance Directive  Out of facility DNR (pink MOST or yellow form)   Pre-existing out of facility DNR order (yellow form or pink MOST form)  Yellow form placed in chart (order not valid for inpatient use)   "MOST" Form in Place?        Other Directives:  Symptom Management:   Currently not in pain. She states her bowels are normally loose as she has been taking stool softeners. She is not currently confused or anxious  Palliative Prophylaxis:   Delirium Protocol:  Xanax and seroquel.  Patient may require haldol.  Additional Recommendations (Limitations, Scope, Preferences): DNI / DNR.  Psycho-social/Spiritual:  Support System: Strong Desire for further Chaplaincy support: no Additional Recommendations: Caregiving  Support/Resources  Prognosis: Unable to determine.  Discharge Planning: Skilled Nursing Facility  for rehab with Palliative care service follow-up   Chief Complaint/ Primary Diagnoses: Present on Admission:  . Pulmonary embolism (HCC) . Dementia with behavioral disturbance . Anxiety and depression . Chronic diastolic CHF (congestive heart failure), NYHA class 1 (HCC) . Benign essential HTN . Dyslipidemia . Normocytic anemia . Syncope  I have reviewed the medical record, interviewed the  patient and family, and examined the patient. The following aspects are pertinent.  Past Medical History  Diagnosis Date  . Hypertension   . Hyperlipidemia   . Anxiety   . Dementia   . Anemia   . CHF (congestive heart failure) (HCC)    Social History   Social History  . Marital Status: Widowed    Spouse Name: N/A  . Number of Children: 3  . Years of Education: 12th   Occupational History  . retired    Social History Main Topics  . Smoking status: Never Smoker   . Smokeless tobacco: Never Used  . Alcohol Use: No  . Drug Use: No  . Sexual Activity: No   Other Topics Concern  . None   Social History Narrative   Family History  Problem Relation Age of Onset  . Cancer Sister   . Stroke Mother   . Stroke Father    Scheduled Meds: . sodium chloride   Intravenous STAT  . acetaminophen  1,000 mg Oral TID  . atorvastatin  20 mg Oral q1800  . cholecalciferol  1,000 Units Oral Q2000  . DULoxetine  60 mg Oral Daily  . furosemide  20 mg Oral QODAY  . latanoprost  1 drop Both Eyes QHS  . losartan  100 mg Oral Daily  . metoprolol  100 mg Oral BID  . polyethylene glycol  17 g Oral BID  . QUEtiapine  25 mg Oral QHS  . senna-docusate  2 tablet Oral BID  . sodium chloride  3 mL Intravenous Q12H   Continuous Infusions: . heparin 1,000 Units/hr (05/08/15 1038)   PRN Meds:.ALPRAZolam, hydrocortisone, ondansetron **OR** ondansetron (ZOFRAN) IV Medications Prior to Admission:  Prior to Admission medications   Medication Sig Start Date End Date Taking? Authorizing Provider  acetaminophen (TYLENOL) 500 MG tablet Take 1,000 mg by mouth 3 (three) times daily.   Yes Historical Provider, MD  ALPRAZolam (XANAX) 0.25 MG tablet Take 0.25 mg by mouth every 4 (four) hours as needed for anxiety.   Yes Historical Provider, MD  aspirin 325 MG tablet Take 325 mg by mouth daily.   Yes Historical Provider, MD  atorvastatin (LIPITOR) 20 MG tablet TAKE 1 TABLET BY MOUTH EVERY EVENING TO CONTROL  CHOLESTEROL 06/23/14  Yes Kimber Relic, MD  cholecalciferol (VITAMIN D) 1000 UNITS tablet Take 1,000 Units by mouth daily at 8 pm.    Yes Historical Provider, MD  doxycycline (VIBRA-TABS) 100 MG tablet Take 1 tablet (100 mg total) by mouth 2 (two) times daily. 04/24/15  Yes Amber Celedonio Savage, PA-C  DULoxetine (CYMBALTA) 60 MG capsule Take 1 capsule (60 mg total) by mouth daily. One daily to help nerves 05/01/15  Yes Levert Feinstein, MD  furosemide (LASIX) 20 MG tablet Take 1 tablet (20 mg total) by mouth every other day. 04/24/15  Yes Amber Celedonio Savage, PA-C  hydrocortisone (ANUSOL-HC) 2.5 % rectal cream Place 1 application rectally 2 (two) times daily as needed for hemorrhoids or itching.   Yes Historical Provider, MD  latanoprost (XALATAN) 0.005 % ophthalmic solution PLACE 1 DROP IN BOTH EYES AT BEDTIME TO TREAT GLAUCOMA 02/23/15  Yes Kimber Relic, MD  losartan (COZAAR) 100 MG tablet TAKE 1 TABLET BY MOUTH ONCE DAILY FOR BLOOD PRESSURE 11/17/14  Yes Kimber Relic, MD  metoprolol (LOPRESSOR) 100 MG tablet TAKE 1 TABLET BY MOUTH TWICE DAILY TO CONTORL BLOOD PRESSURE 06/23/14  Yes Kimber Relic, MD  polyethylene glycol (MIRALAX / GLYCOLAX) packet Take 17 g by mouth 2 (two) times daily.   Yes Historical Provider, MD  QUEtiapine (SEROQUEL) 25 MG tablet One tab po every night for one week, then 2 tabs po every night 05/01/15  Yes Levert Feinstein, MD  sennosides-docusate sodium (SENOKOT-S) 8.6-50 MG tablet Take 2 tablets by mouth 2 (two) times daily.   Yes Historical Provider, MD  Calcium Carbonate-Vitamin D (CALCIUM 600+D) 600-400 MG-UNIT tablet Take 1 tablet by mouth daily. Patient not taking: Reported on 05/07/2015 04/24/15   Dimitri Ped, PA-C  traMADol (ULTRAM) 50 MG tablet Take 1-2 tablets (50-100 mg total) by mouth every 6 (six) hours as needed for moderate pain. Patient not taking: Reported on 05/07/2015 04/24/15   Dimitri Ped, PA-C   No Known Allergies  Review of Systems  Constitutional: Negative.   HENT:  Positive for rhinorrhea, sinus pressure and sneezing.   Eyes: Negative.   Respiratory: Negative for cough, chest tightness and shortness of breath.   Cardiovascular: Positive for leg swelling. Negative for chest pain and palpitations.  Gastrointestinal: Negative for constipation.  Endocrine: Negative.   Genitourinary: Negative for dysuria.  Musculoskeletal: Negative.   Skin: Negative.   Allergic/Immunologic: Negative.   Neurological: Negative.   Hematological: Negative.   Psychiatric/Behavioral: Positive for confusion.    Physical Exam Pleasantly demented elderly female, talkative CV RRR no m/r/g Resp:  CTA, decreased breath sounds, no pain with inspiration Abdomen:  Soft, nt, nd, +bs Extremities:  Bilateral lower extremity swelling (2-3+), mild anasarca   Vital Signs: BP 162/70 mmHg  Pulse 80  Temp(Src) 97.7 F (36.5 C) (Oral)  Resp 16  Ht  (1.575 m)  Wt 68.856 kg (151 lb 12.8 oz)  BMI 27.76 kg/m2  SpO2 100%  SpO2: SpO2: 100 % O2 Device:SpO2: 100 % O2 Flow Rate: .   IO: Intake/output summary: No intake or output data in the 24 hours ending 05/08/15 1656  LBM:   Baseline Weight: Weight: 68.856 kg (151 lb 12.8 oz) Most recent weight: Weight: 68.856 kg (151 lb 12.8 oz)      Palliative Assessment/Data:  Flowsheet Rows        Most Recent Value   Intake Tab    Referral Department  Hospitalist   Unit at Time of Referral  Med/Surg Unit   Palliative Care Primary Diagnosis  Neurology   Date Notified  05/08/15   Palliative Care Type  New Palliative care   Reason for referral  Clarify Goals of Care   Date of Admission  05/08/15   Date first seen by Palliative Care  05/08/15   # of days Palliative referral response time  0 Day(s)   # of days IP prior to Palliative referral  0   Clinical Assessment    Palliative Performance Scale Score  50%   Dyspnea Max Last 24 Hours  4   Psychosocial & Spiritual Assessment    Palliative Care Outcomes       Additional Data  Reviewed:  CBC:    Component Value Date/Time   WBC 9.9 05/08/2015 0015   WBC 9.1 02/17/2015   HGB 10.0* 05/08/2015 0015   HCT 31.5* 05/08/2015 0015  PLT 384 05/08/2015 0015   MCV 90.5 05/08/2015 0015   NEUTROABS 7.4 05/08/2015 0015   NEUTROABS 2.8 09/18/2012 1641   LYMPHSABS 1.5 05/08/2015 0015   LYMPHSABS 1.9 09/18/2012 1641   MONOABS 0.8 05/08/2015 0015   EOSABS 0.2 05/08/2015 0015   EOSABS 0.4 09/18/2012 1641   BASOSABS 0.0 05/08/2015 0015   BASOSABS 0.0 09/18/2012 1641   Comprehensive Metabolic Panel:    Component Value Date/Time   NA 141 05/08/2015 0015   NA 143 02/17/2015   K 3.8 05/08/2015 0015   CL 107 05/08/2015 0015   CO2 23 05/08/2015 0015   BUN 25* 05/08/2015 0015   BUN 32* 02/17/2015   CREATININE 0.93 05/08/2015 0015   CREATININE 0.9 02/17/2015   GLUCOSE 123* 05/08/2015 0015   GLUCOSE 90 02/21/2014 1046   CALCIUM 9.0 05/08/2015 0015   AST 18 04/17/2015 1230   ALT 13* 04/17/2015 1230   ALKPHOS 120 04/17/2015 1230   BILITOT 0.7 04/17/2015 1230   PROT 6.6 04/17/2015 1230   PROT 6.7 02/21/2014 1046   ALBUMIN 2.9* 04/17/2015 1230   ALBUMIN 4.4 02/21/2014 1046     Time In: 1545 Time Out: 1655 Time Total: 70 min Greater than 50%  of this time was spent counseling and coordinating care related to the above assessment and plan.  Signed by:   Algis Downs, PA-C Palliative Medicine Pager: [phone]  05/08/2015, 4:56 PM  Please contact Palliative Medicine Team phone at [phone] for questions and concerns.
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
## 2015-05-08 NOTE — ED Notes (Signed)
Was unable to collect blue top

## 2015-05-08 NOTE — Consult Note (Signed)
WOC wound consult note Reason for Consult:full thickness wound on right knee Wound type: Surgical Pressure Ulcer POA: No Measurement:4cm x 1cm x 0.5cm  Wound bed:red, moist, with exposed hardware in wound base Drainage (amount, consistency, odor) serosanguinous Periwound:intact Dressing procedure/placement/frequency: I will provide Nursing with orders for daily dressing to absorb exudate that support moisture retentive environment conducive to tissue repair, albeit complete healing is unlikely due to multiple factors noted by others. WOC nursing team will not follow, but will remain available to this patient, the nursing and medical teams.  Please re-consult if needed. Thanks, Ladona Mow, MSN, RN, GNP, [facility] [place]  Pager# [phone]

## 2015-05-08 NOTE — ED Provider Notes (Addendum)
CSN: [id number]     Arrival date & time 05/07/15  2337 History   First MD Initiated Contact with Patient 05/07/15 2340     Chief Complaint  Patient presents with  . Loss of Consciousness      Patient is a 82 y.o. female presenting with syncope. The history is provided by the nursing home, the EMS personnel and the patient. The history is limited by the condition of the patient (Hx dementia).  Loss of Consciousness Pt was seen at 2345. Per EMS, NH report and pt:  Pt was on commode having a BM when she had a brief syncopal episode. Staff was standing next to pt, and pt did not fall. EMS noted pt to be hypotensive on scene, which improved en route. Pt has hx of dementia and is acting per her baseline. Pt currently denies any complaints.    Past Medical History  Diagnosis Date  . Hypertension   . Abnormality of gait   . Disturbance of skin sensation   . Tachycardia, unspecified   . Internal hemorrhoids without mention of complication   . Synovial cyst of popliteal space   . Spinal stenosis, other region   . Sciatica   . Lumbago   . Unspecified vitamin D deficiency   . Anxiety   . Hyperlipidemia   . Abnormality of gait   . Unspecified sinusitis (chronic)   . Unspecified pruritic disorder   . Pain in joint, lower leg   . Depressive disorder, not elsewhere classified   . Unspecified glaucoma   . Reflux esophagitis   . Diverticulosis of colon (without mention of hemorrhage)   . Pain in joint, site unspecified   . Myalgia and myositis, unspecified   . Osteoporosis, unspecified   . Other malaise and fatigue   . Cervicalgia   . Benign neoplasm of colon   . H/O echocardiogram 09/01/11    EF >55%, mild MR, RV systolic pressure elevated at 40-98 mmHg, mod TR  . Normal cardiac stress test 11/16/09    low risk scan, EF  77%  . Shortness of breath   . Stroke [facility] "a couple of years ago"     visual problems left eye  . Arthritis   . Swelling of both ankles     rt leg  . Bruises easily    . Complication of anesthesia     SLOW TO AWAKEN MORE CONFUSION AFTER LAST SURGERY.WANTS SPINAL OR NERVE BLOCK IF POSSIBLE  . DNR (do not resuscitate)    Past Surgical History  Procedure Laterality Date  . Lumbar laminectomy  12/04/2012    Complete decompressive L3-4 and L4-5--Dr. Darrelyn Hillock  . Abdominal hysterectomy    . Cholecystectomy    . Cardiovascular stress test  11/16/2009    Perfusion defect seen in inferior myocardial region consistent with diaphragmatic attenuation. Remaining myocardium demonstrates normalmyocardial perfusion with no evidence of ischemia or infarct. No ECG changes. EKG negative for ischemia.  . Transthoracic echocardiogram  09/01/2011    EF >55% and moderate tricuspid valve regurg.  . Bilateral oophorectomy    . Total knee arthroplasty Right 02/11/2015    Procedure: TOTAL KNEE ARTHROPLASTY;  Surgeon: Ranee Gosselin, MD;  Location: [facility];  Service: Orthopedics;  Laterality: Right;  . I&d knee with poly exchange Right 04/21/2015    Procedure: IRRIGATION AND DEBRIDEMENT RIGHT KNEE WITH PATELLECTOMY AND REPAIR OF COMPLEX OPEN WOUND;  Surgeon: Ranee Gosselin, MD;  Location: [facility];  Service: Orthopedics;  Laterality: Right;   Family  History  Problem Relation Age of Onset  . Cancer Sister   . Stroke Mother   . Stroke Father    Social History  Substance Use Topics  . Smoking status: Never Smoker   . Smokeless tobacco: Never Used  . Alcohol Use: No    Review of Systems  Unable to perform ROS: Dementia  Cardiovascular: Positive for syncope.      Allergies  Review of patient's allergies indicates no known allergies.  Home Medications   Prior to Admission medications   Medication Sig Start Date End Date Taking? Authorizing Provider  acetaminophen (TYLENOL) 500 MG tablet Take 1,000 mg by mouth 3 (three) times daily.   Yes Historical Provider, MD  ALPRAZolam (XANAX) 0.25 MG tablet Take 0.25 mg by mouth every 4 (four) hours as needed for anxiety.   Yes  Historical Provider, MD  aspirin 325 MG tablet Take 325 mg by mouth daily.   Yes Historical Provider, MD  atorvastatin (LIPITOR) 20 MG tablet TAKE 1 TABLET BY MOUTH EVERY EVENING TO CONTROL CHOLESTEROL 06/23/14  Yes Kimber Relic, MD  cholecalciferol (VITAMIN D) 1000 UNITS tablet Take 1,000 Units by mouth daily at 8 pm.    Yes Historical Provider, MD  doxycycline (VIBRA-TABS) 100 MG tablet Take 1 tablet (100 mg total) by mouth 2 (two) times daily. 04/24/15  Yes Amber Celedonio Savage, PA-C  DULoxetine (CYMBALTA) 60 MG capsule Take 1 capsule (60 mg total) by mouth daily. One daily to help nerves 05/01/15  Yes Levert Feinstein, MD  furosemide (LASIX) 20 MG tablet Take 1 tablet (20 mg total) by mouth every other day. 04/24/15  Yes Amber Celedonio Savage, PA-C  hydrocortisone (ANUSOL-HC) 2.5 % rectal cream Place 1 application rectally 2 (two) times daily as needed for hemorrhoids or itching.   Yes Historical Provider, MD  latanoprost (XALATAN) 0.005 % ophthalmic solution PLACE 1 DROP IN BOTH EYES AT BEDTIME TO TREAT GLAUCOMA 02/23/15  Yes Kimber Relic, MD  losartan (COZAAR) 100 MG tablet TAKE 1 TABLET BY MOUTH ONCE DAILY FOR BLOOD PRESSURE 11/17/14  Yes Kimber Relic, MD  metoprolol (LOPRESSOR) 100 MG tablet TAKE 1 TABLET BY MOUTH TWICE DAILY TO CONTORL BLOOD PRESSURE 06/23/14  Yes Kimber Relic, MD  polyethylene glycol (MIRALAX / GLYCOLAX) packet Take 17 g by mouth 2 (two) times daily.   Yes Historical Provider, MD  QUEtiapine (SEROQUEL) 25 MG tablet One tab po every night for one week, then 2 tabs po every night 05/01/15  Yes Levert Feinstein, MD  sennosides-docusate sodium (SENOKOT-S) 8.6-50 MG tablet Take 2 tablets by mouth 2 (two) times daily.   Yes Historical Provider, MD  Calcium Carbonate-Vitamin D (CALCIUM 600+D) 600-400 MG-UNIT tablet Take 1 tablet by mouth daily. Patient not taking: Reported on 05/07/2015 04/24/15   Dimitri Ped, PA-C  traMADol (ULTRAM) 50 MG tablet Take 1-2 tablets (50-100 mg total) by mouth every 6 (six) hours  as needed for moderate pain. Patient not taking: Reported on 05/07/2015 04/24/15   Amber Constable, PA-C   BP 133/56 mmHg  Pulse 65  Temp(Src) 97.3 F (36.3 C) (Oral)  Resp 18  SpO2 97%   00:55 Orthostatic Vital Signs LH  Orthostatic Lying  - BP- Lying: 134/64 mmHg ; Pulse- Lying: 70  Orthostatic Sitting - BP- Sitting: 136/73 mmHg ; Pulse- Sitting: 74      Physical Exam  2350: Physical examination:  Nursing notes reviewed; Vital signs and O2 SAT reviewed;  Constitutional: Well developed, Well nourished, Well hydrated, In no acute  distress; Head:  Normocephalic, atraumatic; Eyes: EOMI, PERRL, No scleral icterus; ENMT: Mouth and pharynx normal, Mucous membranes moist; Neck: Supple, Full range of motion, No lymphadenopathy; Cardiovascular: Regular rate and rhythm, No gallop; Respiratory: Breath sounds clear & equal bilaterally, No wheezes.  Speaking full sentences with ease, Normal respiratory effort/excursion; Chest: Nontender, Movement normal; Abdomen: Soft, Nontender, Nondistended, Normal bowel sounds; Genitourinary: No CVA tenderness; Extremities: Pulses normal, +2 pedal edema bilat. +knee immobilizer RLE.;; Neuro: Awake, alert, confused per hx dementia. No facial droop. Major CN grossly intact.  Speech clear. Grips equal. Moves all extremities spontaneously and to command without apparent gross focal motor deficits.; Skin: Color normal, Warm, Dry.   ED Course  Procedures (including critical care time) Labs Review   Imaging Review  I have personally reviewed and evaluated these images and lab results as part of my medical decision-making.   EKG Interpretation   Date/Time:  Thursday May 07 2015 23:47:30 EST Ventricular Rate:  66 PR Interval:  191 QRS Duration: 97 QT Interval:  455 QTC Calculation: 477 R Axis:   -18 Text Interpretation:  Sinus rhythm Left axis deviation Abnormal R-wave  progression, early transition Artifact When compared with ECG of  02/28/2013 QT has  shortened Otherwise no significant change Confirmed by  [facility]  MD, Nicholos Johns [phone]) on 05/08/2015 12:12:50 AM      MDM  MDM Reviewed: previous chart, nursing note and vitals Reviewed previous: labs and ECG Interpretation: ECG, labs, x-ray and CT scan Total time providing critical care: 30-74 minutes. This excludes time spent performing separately reportable procedures and services. Consults: admitting MD   CRITICAL CARE Performed by: Laray Anger Total critical care time: 35 minutes Critical care time was exclusive of separately billable procedures and treating other patients. Critical care was necessary to treat or prevent imminent or life-threatening deterioration. Critical care was time spent personally by me on the following activities: development of treatment plan with patient and/or surrogate as well as nursing, discussions with consultants, evaluation of patient's response to treatment, examination of patient, obtaining history from patient or surrogate, ordering and performing treatments and interventions, ordering and review of laboratory studies, ordering and review of radiographic studies, pulse oximetry and re-evaluation of patient's condition.    Results for orders placed or performed during the hospital encounter of 05/07/15  CBC with Differential  Result Value Ref Range   WBC 9.9 4.0 - 10.5 K/uL   RBC 3.48 (L) 3.87 - 5.11 MIL/uL   Hemoglobin 10.0 (L) 12.0 - 15.0 g/dL   HCT 60.4 (L) 54.0 - 98.1 %   MCV 90.5 78.0 - 100.0 fL   MCH 28.7 26.0 - 34.0 pg   MCHC 31.7 30.0 - 36.0 g/dL   RDW 19.1 (H) 47.8 - 29.5 %   Platelets 384 150 - 400 K/uL   Neutrophils Relative % 75 %   Neutro Abs 7.4 1.7 - 7.7 K/uL   Lymphocytes Relative 15 %   Lymphs Abs 1.5 0.7 - 4.0 K/uL   Monocytes Relative 8 %   Monocytes Absolute 0.8 0.1 - 1.0 K/uL   Eosinophils Relative 2 %   Eosinophils Absolute 0.2 0.0 - 0.7 K/uL   Basophils Relative 0 %   Basophils Absolute 0.0 0.0 - 0.1 K/uL   Troponin I  Result Value Ref Range   Troponin I <0.03 <0.031 ng/mL  Basic metabolic panel  Result Value Ref Range   Sodium 141 135 - 145 mmol/L   Potassium 3.8 3.5 - 5.1 mmol/L   Chloride  107 101 - 111 mmol/L   CO2 23 22 - 32 mmol/L   Glucose, Bld 123 (H) 65 - 99 mg/dL   BUN 25 (H) 6 - 20 mg/dL   Creatinine, Ser 1.61 0.44 - 1.00 mg/dL   Calcium 9.0 8.9 - 09.6 mg/dL   GFR calc non Af Amer 54 (L) >60 mL/min   GFR calc Af Amer >60 >60 mL/min   Anion gap 11 5 - 15  Urinalysis, Routine w reflex microscopic  Result Value Ref Range   Color, Urine YELLOW YELLOW   APPearance CLEAR CLEAR   Specific Gravity, Urine 1.009 1.005 - 1.030   pH 6.5 5.0 - 8.0   Glucose, UA NEGATIVE NEGATIVE mg/dL   Hgb urine dipstick NEGATIVE NEGATIVE   Bilirubin Urine NEGATIVE NEGATIVE   Ketones, ur NEGATIVE NEGATIVE mg/dL   Protein, ur NEGATIVE NEGATIVE mg/dL   Nitrite NEGATIVE NEGATIVE   Leukocytes, UA NEGATIVE NEGATIVE   Dg Chest 1 View 05/08/2015  CLINICAL DATA:  82 year old female with syncope EXAM: CHEST 1 VIEW COMPARISON:  Radiograph dated 04/17/2015 FINDINGS: Single view of the chest demonstrates small focal density at the left costophrenic angle, likely a subsegmental atelectatic changes. Pneumonia is less likely but not excluded. Clinical correlation is recommended. There is a focal area nodularity and increased vascularity at the right lung base. No significant pleural effusion. No pneumothorax. Stable cardiac silhouette. No acute osseous pathology. IMPRESSION: Bibasilar densities and vascular prominence, likely atelectatic changes. Pneumonia is not excluded. Clinical correlation is recommended. Electronically Signed   By: Elgie Collard M.D.   On: 05/08/2015 01:22   Ct Head Wo Contrast 05/08/2015  CLINICAL DATA:  82 year old female with syncope EXAM: CT HEAD WITHOUT CONTRAST TECHNIQUE: Contiguous axial images were obtained from the base of the skull through the vertex without intravenous contrast.  COMPARISON:  Head CT dated 04/23/2015 FINDINGS: The ventricles are dilated and the sulci are prominent compatible with age-related atrophy. Mild Periventricular and deep white matter hypodensities represent chronic microvascular ischemic changes. Stable focal right cerebellar old infarct and encephalomalacia. There is no intracranial hemorrhage. No mass effect or midline shift identified. There is partial opacification of the left sphenoid sinus. The remainder of the paranasal sinuses and mastoid air cells are well aerated. The calvarium is intact. IMPRESSION: No acute intracranial hemorrhage. Age-related atrophy and chronic microvascular ischemic disease. Stable old right cerebellar infarct. If symptoms persist and there are no contraindications, MRI may provide better evaluation if clinically indicated. Electronically Signed   By: Elgie Collard M.D.   On: 05/08/2015 01:15   Ct Angio Chest Pe W/cm &/or Wo Cm 05/08/2015  CLINICAL DATA:  82 year old female with recent right knee surgery presenting with syncope and tachycardia. EXAM: CT ANGIOGRAPHY CHEST WITH CONTRAST TECHNIQUE: Multidetector CT imaging of the chest was performed using the standard protocol during bolus administration of intravenous contrast. Multiplanar CT image reconstructions and MIPs were obtained to evaluate the vascular anatomy. CONTRAST:  OMNIPAQUE IOHEXOL 350 MG/ML SOLN COMPARISON:  Chest radiograph dated 05/08/2015 FINDINGS: Minimal bibasilar dependent atelectatic changes. There is no focal consolidation, pleural effusion, or pneumothorax. The central airways are patent. Mild atherosclerotic calcification of the aorta. No evidence of thoracic aortic aneurysm or dissection. There are pulmonary emboli involving the bilateral lower lobe segmental branch point. The right lower lobe embolus extends into the subsegmental pulmonary artery branches. Small right middle lobe pulmonary artery embolus noted. There is no cardiomegaly or  pericardial effusion. No CT evidence of right cardiac straining. There is no  mediastinal adenopathy. Top-normal bilateral hilar lymph nodes noted. There is a moderate size hiatal hernia. The esophagus is grossly unremarkable. There is no axillary adenopathy. The chest wall soft tissues appear unremarkable. There is degenerative changes of the spine and multiple old left anterior rib fractures. No acute fracture. The visualized upper abdomen appears unremarkable. Review of the MIP images confirms the above findings. IMPRESSION: Bilateral lower lobe pulmonary artery emboli. No CT evidence of right heart strain. These results were called by telephone at the time of interpretation on 05/08/2015 at 6:16 am to Nurse [place], who verbally acknowledged these results. Electronically Signed   By: Elgie Collard M.D.   On: 05/08/2015 06:18     0650:  Will tx for PE with IV heparin. Will admit. T/C to [facility] Dr. Toniann Fail, case discussed, including:  HPI, pertinent PM/SHx, VS/PE, dx testing, ED course and treatment:  Agreeable to admit, requests to write temporary orders, obtain tele bed to team [facility].    Samuel Jester, DO 05/10/15 [phone]

## 2015-05-08 NOTE — H&P (Signed)
[facility] History and Physical  LEYANI GARGUS NWG:[id number] DOB: 05-13-1925 DOA: 05/07/2015  Referring physician: ER physician: Dr. Samuel Jester  PCP: Kimber Relic, MD  Chief Complaint: loss of consciousness   HPI:  82 year old female with past medical history of anxiety and depression, dementia, hypertension, dyslipidemia, chronic diastolic congestive heart failure (2-D echo in October 2016 showed normal systolic function with grade 1 diastolic dysfunction), from skilled nursing facility who presented to [facility] with syncopal episode. Patient is not a good historian because of history of dementia. She is not oriented to time, place or person. No respiratory distress. No reports of vomiting. No reports of blood in the stool. No reports of diarrhea. No reports of fevers or cough in skilled nursing facility.  In ED, patient was hemodynamically stable. Blood pressure was 111/83, heart rate 88. Blood work demonstrated hemoglobin of 10 otherwise unremarkable. Troponin was within normal limits. CT head showed no acute intracranial findings. Chest x-ray demonstrated bibasilar densities likely atelectasis although pneumonia is not excluded. CT angiogram of the chest demonstrated bilateral lower lobe pulmonary artery emboli with no evidence of right heart strain. Heparin drip initiated.   Assessment & Plan    Principal Problem:   Syncope / Pulmonary embolism (HCC) - Syncopal event likely secondary to new finding of pulmonary embolism - Heparin drip initiated at the time of the admission, in ED - Because of patient's dementia and risk of fall she may not be a good candidate for long term anticoagulation other than continuation of aspirin she was taking in skilled nursing facility - Palliative care consulted for goals of care - Admission to telemetry unit  Active Problems:   Dementia with behavioral disturbance - So far stable behavior - She is confused, disoriented    Anxiety and depression - Continue Cymbalta and Seroquel    Chronic diastolic CHF (congestive heart failure), NYHA class 1 (HCC) - 2-D echo in October 2016 showed normal ejection fraction with grade 1 diastolic dysfunction - Stable respiratory status - No evidence of right heart strain on CT angiogram of the chest    Benign essential HTN - Resume metoprolol, losartan and Lasix - Renal function is within normal limits    Dyslipidemia - Resume statin therapy    Normocytic anemia - Hemoglobin was 10 on the admission, stable - No evidence of bleeding    DVT prophylaxis:  - On heparin drip because of new finding of pulmonary embolism  Radiological Exams on Admission: Dg Chest 1 View  05/08/2015  CLINICAL DATA:  82 year old female with syncope EXAM: CHEST 1 VIEW COMPARISON:  Radiograph dated 04/17/2015 FINDINGS: Single view of the chest demonstrates small focal density at the left costophrenic angle, likely a subsegmental atelectatic changes. Pneumonia is less likely but not excluded. Clinical correlation is recommended. There is a focal area nodularity and increased vascularity at the right lung base. No significant pleural effusion. No pneumothorax. Stable cardiac silhouette. No acute osseous pathology. IMPRESSION: Bibasilar densities and vascular prominence, likely atelectatic changes. Pneumonia is not excluded. Clinical correlation is recommended. Electronically Signed   By: Elgie Collard M.D.   On: 05/08/2015 01:22   Ct Head Wo Contrast 05/08/2015  No acute intracranial hemorrhage. Age-related atrophy and chronic microvascular ischemic disease. Stable old right cerebellar infarct. If symptoms persist and there are no contraindications, MRI may provide better evaluation if clinically indicated. Electronically Signed   By: Elgie Collard M.D.   On: 05/08/2015 01:15   Ct Angio Chest Pe  W/cm &/or Wo Cm 05/08/2015  Bilateral lower lobe pulmonary artery emboli. No CT evidence of right heart  strain. These results were called by telephone at the time of interpretation on 05/08/2015 at 6:16 am to Nurse Talkington, who verbally acknowledged these results. Electronically Signed   By: Elgie Collard M.D.   On: 05/08/2015 06:18    Code Status: Full Family Communication: Family not at the bedside  Disposition Plan: Admit for further evaluation, telemetry   Vincente Asbridge, Aniceto Boss, MD  [facility] Pager [phone]  Time spent in minutes: 75 minutes  Review of Systems:  Unable to obtain due to patient's altered mental status  Past Medical History  Diagnosis Date  . Hypertension   . Abnormality of gait   . Disturbance of skin sensation   . Tachycardia, unspecified   . Internal hemorrhoids without mention of complication   . Synovial cyst of popliteal space   . Spinal stenosis, other region   . Sciatica   . Lumbago   . Unspecified vitamin D deficiency   . Anxiety   . Hyperlipidemia   . Abnormality of gait   . Unspecified sinusitis (chronic)   . Unspecified pruritic disorder   . Pain in joint, lower leg   . Depressive disorder, not elsewhere classified   . Unspecified glaucoma   . Reflux esophagitis   . Diverticulosis of colon (without mention of hemorrhage)   . Pain in joint, site unspecified   . Myalgia and myositis, unspecified   . Osteoporosis, unspecified   . Other malaise and fatigue   . Cervicalgia   . Benign neoplasm of colon   . H/O echocardiogram 09/01/11    EF >55%, mild MR, RV systolic pressure elevated at 45-40 mmHg, mod TR  . Normal cardiac stress test 11/16/09    low risk scan, EF  77%  . Shortness of breath   . Stroke [facility]) "a couple of years ago"     visual problems left eye  . Arthritis   . Swelling of both ankles     rt leg  . Bruises easily   . Complication of anesthesia     SLOW TO AWAKEN MORE CONFUSION AFTER LAST SURGERY.WANTS SPINAL OR NERVE BLOCK IF POSSIBLE  . DNR (do not resuscitate)    Past Surgical History  Procedure Laterality Date  .  Lumbar laminectomy  12/04/2012    Complete decompressive L3-4 and L4-5--Dr. Darrelyn Hillock  . Abdominal hysterectomy    . Cholecystectomy    . Cardiovascular stress test  11/16/2009    Perfusion defect seen in inferior myocardial region consistent with diaphragmatic attenuation. Remaining myocardium demonstrates normalmyocardial perfusion with no evidence of ischemia or infarct. No ECG changes. EKG negative for ischemia.  . Transthoracic echocardiogram  09/01/2011    EF >55% and moderate tricuspid valve regurg.  . Bilateral oophorectomy    . Total knee arthroplasty Right 02/11/2015    Procedure: TOTAL KNEE ARTHROPLASTY;  Surgeon: Ranee Gosselin, MD;  Location: [facility];  Service: Orthopedics;  Laterality: Right;  . I&d knee with poly exchange Right 04/21/2015    Procedure: IRRIGATION AND DEBRIDEMENT RIGHT KNEE WITH PATELLECTOMY AND REPAIR OF COMPLEX OPEN WOUND;  Surgeon: Ranee Gosselin, MD;  Location: [facility];  Service: Orthopedics;  Laterality: Right;   Social History:  reports that she has never smoked. She has never used smokeless tobacco. She reports that she does not drink alcohol or use illicit drugs.  No Known Allergies  Family History:  Family History  Problem Relation Age  of Onset  . Cancer Sister   . Stroke Mother   . Stroke Father      Prior to Admission medications   Medication Sig Start Date End Date Taking? Authorizing Provider  acetaminophen (TYLENOL) 500 MG tablet Take 1,000 mg by mouth 3 (three) times daily.   Yes Historical Provider, MD  ALPRAZolam (XANAX) 0.25 MG tablet Take 0.25 mg by mouth every 4 (four) hours as needed for anxiety.   Yes Historical Provider, MD  aspirin 325 MG tablet Take 325 mg by mouth daily.   Yes Historical Provider, MD  atorvastatin (LIPITOR) 20 MG tablet TAKE 1 TABLET BY MOUTH EVERY EVENING TO CONTROL CHOLESTEROL 06/23/14  Yes Kimber Relic, MD  cholecalciferol (VITAMIN D) 1000 UNITS tablet Take 1,000 Units by mouth daily at 8 pm.    Yes Historical  Provider, MD  DULoxetine (CYMBALTA) 60 MG capsule Take 1 capsule (60 mg total) by mouth daily. One daily to help nerves 05/01/15  Yes Levert Feinstein, MD  furosemide (LASIX) 20 MG tablet Take 1 tablet (20 mg total) by mouth every other day. 04/24/15  Yes Amber Celedonio Savage, PA-C  hydrocortisone (ANUSOL-HC) 2.5 % rectal cream Place 1 application rectally 2 (two) times daily as needed for hemorrhoids or itching.   Yes Historical Provider, MD  latanoprost (XALATAN) 0.005 % ophthalmic solution PLACE 1 DROP IN BOTH EYES AT BEDTIME TO TREAT GLAUCOMA 02/23/15  Yes Kimber Relic, MD  losartan (COZAAR) 100 MG tablet TAKE 1 TABLET BY MOUTH ONCE DAILY FOR BLOOD PRESSURE 11/17/14  Yes Kimber Relic, MD  metoprolol (LOPRESSOR) 100 MG tablet TAKE 1 TABLET BY MOUTH TWICE DAILY TO CONTORL BLOOD PRESSURE 06/23/14  Yes Kimber Relic, MD  polyethylene glycol (MIRALAX / GLYCOLAX) packet Take 17 g by mouth 2 (two) times daily.   Yes Historical Provider, MD  QUEtiapine (SEROQUEL) 25 MG tablet One tab po every night for one week, then 2 tabs po every night 05/01/15  Yes Levert Feinstein, MD  sennosides-docusate sodium (SENOKOT-S) 8.6-50 MG tablet Take 2 tablets by mouth 2 (two) times daily.   Yes Historical Provider, MD  Calcium Carbonate-Vitamin D (CALCIUM 600+D) 600-400 MG-UNIT tablet Take 1 tablet by mouth daily. Patient not taking: Reported on 05/07/2015 04/24/15   Dimitri Ped, PA-C  traMADol (ULTRAM) 50 MG tablet Take 1-2 tablets (50-100 mg total) by mouth every 6 (six) hours as needed for moderate pain. Patient not taking: Reported on 05/07/2015 04/24/15   Dimitri Ped, PA-C   Physical Exam: Filed Vitals:   05/08/15 0400 05/08/15 0500 05/08/15 0613 05/08/15 0620  BP: 111/83 152/76 174/75   Pulse: 83 82  88  Temp:      TempSrc:      Resp: SpO2: 93% 96%  97%    Physical Exam  Constitutional: Appears well-developed and well-nourished. No distress.  HENT: Normocephalic. No tonsillar erythema or exudates Eyes:  Conjunctivae are normal. No scleral icterus.  Neck: Normal ROM. Neck supple. No JVD. No tracheal deviation. No thyromegaly.  CVS: RRR, S1/S2 appreciated Pulmonary: Effort and breath sounds normal, no stridor, rhonchi, wheezes, rales.  Abdominal: Soft. BS +,  no distension, tenderness, rebound or guarding.  Musculoskeletal: Normal range of motion. +2 lower extremity pitting edema, significant pedal edema appreciated  Lymphadenopathy: No lymphadenopathy noted, cervical, inguinal. Neuro: Alert. Disoriented. No focal neurologic deficits. Skin: Skin is warm and dry. No rash noted.  No erythema. No pallor.  Psychiatric: Normal mood and affect. Behavior normal.  Labs on Admission:  Basic Metabolic Panel:  Recent Labs Lab 05/08/15 0015  NA 141  K 3.8  CL 107  CO2 23  GLUCOSE 123*  BUN 25*  CREATININE 0.93  CALCIUM 9.0   Liver Function Tests: No results for input(s): AST, ALT, ALKPHOS, BILITOT, PROT, ALBUMIN in the last 168 hours. No results for input(s): LIPASE, AMYLASE in the last 168 hours. No results for input(s): AMMONIA in the last 168 hours. CBC:  Recent Labs Lab 05/08/15 0015  WBC 9.9  NEUTROABS 7.4  HGB 10.0*  HCT 31.5*  MCV 90.5  PLT 384   Cardiac Enzymes:  Recent Labs Lab 05/08/15 0015  TROPONINI <0.03   BNP: Invalid input(s): POCBNP CBG: No results for input(s): GLUCAP in the last 168 hours.  If 7PM-7AM, please contact night-coverage [email or web address] Password [email or web address] 05/08/2015, 7:12 AM

## 2015-05-08 NOTE — ED Notes (Addendum)
Done in and out and got no urine output in return. Rn Gerilyn Pilgrim made aware.

## 2015-05-08 NOTE — ED Notes (Signed)
Pt son. Arlys John left. He left his number [phone].

## 2015-05-08 NOTE — Progress Notes (Addendum)
Protocol: Heparin Indication: PE  Assessment: Pt is currently on Heparin at 1000 units/hr. HL=0.59 (goal 0.3-0.7) which is therapeutic. Per RN, there is minimal bleeding from knee wound when cleaning with saline. However, it's stable and she will call pharmacy if it gets worse.  Plan: Will continue heparin at currrent rate of 1000 units/hr. Will check another 8hr level to confirm HL.  Thanks, Dorethea Clan, PharmD 05/08/2015   Addendum:  Per RN, no active bleeding from the knee wound.  Dorethea Clan, PharmD 05/08/2015 

## 2015-05-08 NOTE — Discharge Instructions (Signed)
°Emergency Department Resource Guide °1) Find a Doctor and Pay Out of Pocket °Although you won't have to find out who is covered by your insurance plan, it is a good idea to ask around and get recommendations. You will then need to call the office and see if the doctor you have chosen will accept you as a new patient and what types of options they offer for patients who are self-pay. Some doctors offer discounts or will set up payment plans for their patients who do not have insurance, but you will need to ask so you aren't surprised when you get to your appointment. ° °2) Contact Your Local Health Department °Not all health departments have doctors that can see patients for sick visits, but many do, so it is worth a call to see if yours does. If you don't know where your local health department is, you can check in your phone book. The CDC also has a tool to help you locate your state's health department, and many state websites also have listings of all of their local health departments. ° °3) Find a Walk-in Clinic °If your illness is not likely to be very severe or complicated, you may want to try a walk in clinic. These are popping up all over the country in pharmacies, drugstores, and shopping centers. They're usually staffed by nurse practitioners or physician assistants that have been trained to treat common illnesses and complaints. They're usually fairly quick and inexpensive. However, if you have serious medical issues or chronic medical problems, these are probably not your best option. ° °No Primary Care Doctor: °- Call [facility] at  832-8000 - they can help you locate a primary care doctor that  accepts your insurance, provides certain services, etc. °- Physician Referral Service- 1-800-533-3463 ° °Chronic Pain Problems: °Organization         Address  Phone   Notes  °[facility]  (336) 297-2271 Patients need to be referred by their primary care doctor.  ° °Medication  Assistance: °Organization         Address  Phone   Notes  °[place] [facility] [address] °[place], [place] [address] (336) 641-8030 --Must be a resident of [place] °-- Must have NO insurance coverage whatsoever (no Medicaid/ Medicare, etc.) °-- The pt. MUST have a primary care doctor that directs their care regularly and follows them in the community °  °[facility]  (866) 331-1348   °[employer]  (888) 892-1162   ° °Agencies that provide inexpensive medical care: °Organization         Address  Phone   Notes  °[facility]  (336) 832-8035   °[facility]    (336) 832-7272   °[facility] [address] °[place], [place] [address] (336) 832-4777   °[facility] [address], °[place] (336) 271-4999   °[facility]    (336) 373-0678   °[facility]    (336) 272-1050   °[facility] ° [address], [place] Phone:  (336) 832-4444, Fax:  (336) 832-4440 Hours of Operation:  9 am - 6 pm, M-F.  Also accepts Medicaid/Medicare and self-pay.  °[facility] ° [address], [place] Phone: (336) 832-3150, Fax: (336) 832-3151. Hours of Operation:  8:30 am - 5:30 pm, M-F.  Also accepts Medicaid and self-pay.  °[facility] [place] [address], [place] Phone: (336) 878-6027   °[facility] [address], [place], [place] (336)723-1848, Ext. 123 Mondays & Thursdays: 7-9 AM.  First 15 patients are seen on a first come, first serve basis. °  ° °Medicaid-accepting [place] Providers: ° °Organization         Address  Phone   Notes  °[facility] [address], [place] (336) 641-2100 Also accepts self-pay patients.  °[facility] [address], [place] ° (336) 856-9996   °[facility] [address], [place]  (336) 288-8857   °[facility] [address] [place] [address], [place] (336) 299-7000   °Veita Bland [address], [place]  ° (336) 373-1557 Only accepts [place] Access Medicaid patients after they have their name applied to their card.  ° °Self-Pay (no insurance) in [place]: ° °Organization         Address  Phone   Notes  °Sickle Cell Patients, [facility] [address], [place] (336) 832-1970   °[facility] [address], [place] (336) 832-4400   °[facility] ° [address], [place] (336) 992-4800   °[facility]/Dr. Osei-Bonsu ° [address] [place] [address], [place] or [address], [place] (336) 841-8500 Phone number for both [place] and [place] locations is the same.  °[facility] [address], [place] (336) 299-0000   °[facility] [address] [place] [address], [place] or [address] (336) 852-7530 °(336) 878-2260   °[facility] [address], [place] (336) 350-1642, phone; (336) 294-5005, fax Sees patients 1st and 3rd Saturday of every month.  Must not qualify for public or private insurance (i.e. Medicaid, Medicare, [place] Health Choice, Veterans' Benefits) • Household income should be no more than 200% of the poverty level •The clinic cannot treat you if you are pregnant or think you are pregnant • Sexually transmitted diseases are not treated at the clinic.  ° ° °Dental Care: °Organization         Address  Phone  Notes  °[place] [facility] [address], [place] (336) 641-6152 Accepts children up to age 21 who are enrolled in Medicaid or [place] Health Choice; pregnant women with a Medicaid card; and children who have applied for Medicaid or [place] Health Choice, but were declined, whose parents can pay a reduced fee at time of service.  °[place]  [facility] [place]  [address], [place] (336) 641-7733 Accepts children up to age 21 who are enrolled in Medicaid or [place] Health Choice; pregnant women with a Medicaid card; and children who have applied for Medicaid or [place] Health Choice, but were declined, whose parents can pay a reduced fee at time of service.  °[facility] ° [address], [place] (336) 641-4533 Patients are seen by appointment only. Walk-ins are not accepted. [facility] will see patients 18 years of age and older. °Monday - Tuesday (8am-5pm) °Most Wednesdays (8:30-5pm) °$30 per visit, cash only  °[facility] ° [address], [place] (336) 641-4533 Patients are seen by appointment only. Walk-ins are not accepted. [facility] will see patients 18 years of age and older. °One   Wednesday Evening (Monthly: Volunteer Based).  $30 per visit, cash only  °[facility]  (919) 537-3737 for adults; Children under age 4, call Graduate Pediatric Dentistry at ([phone]) [phone]. Children aged 4-14, please call (919) [phone] to request a pediatric application. ° Dental services are provided in all areas of dental care including fillings, crowns and bridges, complete and partial dentures, implants, gum treatment, root canals, and extractions. Preventive care is also provided. Treatment is provided to both adults and children. °Patients are selected via a lottery and there is often a waiting list. °  °[facility] [address], °[place] ° (336) 763-8833 [email or web address] °  °[facility] [address], [place], [place] (336)723-1848, Ext. 123 Second and Fourth Thursday of each month, opens at 6:30 AM; Clinic ends at 9 AM.  Patients are seen on a first-come first-served basis, and a limited number are seen during each clinic.  ° °[facility] ° [address], [place], [place] (336) 723-7904    Eligibility Requirements °You must have lived in [place], or [place] counties for at least the last three months. °  You cannot be eligible for state or federal sponsored healthcare insurance, including Veterans Administration, Medicaid, or Medicare. °  You generally cannot be eligible for healthcare insurance through your employer.  °  How to apply: °Eligibility screenings are held every Tuesday and Wednesday afternoon from 1:00 pm until 4:00 pm. You do not need an appointment for the interview!  °[facility] [address], [place], [place] 336-631-2330   °[facility]  336-342-8273   °[facility]  336-703-3100   °[facility]  336-570-6415   ° °Behavioral Health Resources in the Community: °Intensive Outpatient Programs °Organization         Address  Phone  Notes  °[facility] [address], [place], [place] 336-878-6098   °[facility] [address], [place], [place] 336-832-9800   °[facility] [address], [place], [place] ° 336-882-2125   °[facility] [address],  °[place], [place] 1-800-853-5163 or 336-641-4981   °Substance Abuse Resources °Organization         Address  Phone  Notes  °[facility]  336-882-2125   °[facility]  336-784-9470   °[facility]  336-285-9073   °[facility]  336-845-3988   °Residential & Outpatient Substance Abuse Program  1-800-659-3381   °Psychological Services °Organization         Address  Phone  Notes  °[facility]  336- 832-9600   °[facility]  336- 378-7881   °[facility] [address], [place] 1-800-853-5163 or 336-641-4981   ° °Mobile Crisis Teams °Organization         Address  Phone  Notes  °[facility], Mobile Crisis Care Unit  1-877-626-1772   °Assertive °[facility] ° [address]  [place], [place] 336-834-9664   °Sharon DeEsch [address] °[place] 336-554-5454   ° °Self-Help/Support Groups °Organization         Address  Phone             Notes  °[facility]  - variety of support groups  336- 373-1402 Call for more information  °Narcotics Anonymous (NA), [facility] [address], °[place]  2 meetings at this location  ° °  Residential Treatment Programs Organization         Address  Phone  Notes  [facility] [address],    [place]  1-610-960-4540   [facility]  [address] [place] [address], [place], [place] 478-295-6213   [facility] [address] [place], [place] 086-578-4696 Admissions: 8am-3pm M-F  [facility] [address],    [place], [place] 295-284-1324   [facility] [address] [place], [place], [place] 401-027-2536   [facility] [address]  [place], [place] 644-034-7425   [facility] Intensive Outpatient [address], [place], [place] 956-387-5643   [facility] ([facility] [address] [place]  [place], [place] 3-295-188-4166 or (347)352-1201   [facility] [address] [place], [place] 323-557-3220 Accepts Medicaid  [facility] [address],  [place] 2-542-706-2376 Substance Abuse/Addiction Treatment   [facility] Organization         Address  Phone  Notes  [facility]  (908)562-7087   Angie Fava, PhD [address] [place], [place]   765-168-3498 or 608-084-7303   [facility]   [address] [place], [place] 520-068-5528   [facility] [address] [place], [place] [phone] Insurance/Medicaid/sponsorship through [facility] [address]                                    [place], [place] ([phone] Therapy/tele-psych/case    [facility] [address], [place] [phone]    Dr. Lolly Mustache  (925)[phone]   [facility]  [employer] [facility] Dept. 1) [address] [place] 2) [address] [place] 3)  [address], [place] ([phone] 336-342-6887  (609) 085-2854   [facility] Child Abuse Hotline (913)303-1238 or 332-569-7809 (After Hours)      Take your usual prescriptions as previously directed.  Call your regular medical doctor today to schedule a follow up appointment within the next 24 to 48 hours.  Return to the Emergency Department immediately sooner if worsening.

## 2015-05-08 NOTE — ED Notes (Addendum)
Dr called to inform of Pt bilateral PEs. Awaiting further orders. Pt in no distress at this time. O2 97%

## 2015-05-08 NOTE — Plan of Care (Signed)
82 year old female who has had a recent right knee surgery brought into the ER from nursing home after patient had a syncopal episode. Workup shows bilateral pulmonary embolism and patient has been admitted for further management. Patient has been started on heparin.  Kristin Wilkerson

## 2015-05-08 NOTE — ED Notes (Signed)
Pt transported to CT ?

## 2015-05-08 NOTE — Progress Notes (Signed)
ANTICOAGULATION CONSULT NOTE - Initial Consult  Pharmacy Consult for Heparin Indication: pulmonary embolus  No Known Allergies  Patient Measurements:   Heparin Dosing Weight: 62 kg  Vital Signs: Temp: 97.3 F (36.3 C) (01/19 2348) Temp Source: Oral (01/19 2348) BP: 174/75 mmHg (01/20 0613) Pulse Rate: 88 (01/20 0620)  Labs:  Recent Labs  05/08/15 0015  HGB 10.0*  HCT 31.5*  PLT 384  CREATININE 0.93  TROPONINI <0.03    CrCl cannot be calculated (Unknown ideal weight.).   Medical History: Past Medical History  Diagnosis Date  . Hypertension   . Abnormality of gait   . Disturbance of skin sensation   . Tachycardia, unspecified   . Internal hemorrhoids without mention of complication   . Synovial cyst of popliteal space   . Spinal stenosis, other region   . Sciatica   . Lumbago   . Unspecified vitamin D deficiency   . Anxiety   . Hyperlipidemia   . Abnormality of gait   . Unspecified sinusitis (chronic)   . Unspecified pruritic disorder   . Pain in joint, lower leg   . Depressive disorder, not elsewhere classified   . Unspecified glaucoma   . Reflux esophagitis   . Diverticulosis of colon (without mention of hemorrhage)   . Pain in joint, site unspecified   . Myalgia and myositis, unspecified   . Osteoporosis, unspecified   . Other malaise and fatigue   . Cervicalgia   . Benign neoplasm of colon   . H/O echocardiogram 09/01/11    EF >55%, mild MR, RV systolic pressure elevated at 16-10 mmHg, mod TR  . Normal cardiac stress test 11/16/09    low risk scan, EF  77%  . Shortness of breath   . Stroke [facility]) "a couple of years ago"     visual problems left eye  . Arthritis   . Swelling of both ankles     rt leg  . Bruises easily   . Complication of anesthesia     SLOW TO AWAKEN MORE CONFUSION AFTER LAST SURGERY.WANTS SPINAL OR NERVE BLOCK IF POSSIBLE  . DNR (do not resuscitate)     Medications:  Scheduled:  . heparin  3,000 Units Intravenous Once    Infusions:  . heparin      Assessment:  82 yr female presents to ED s/p syncopal episode  CTAngio shows + bilateral pulmonary embolism  Pharmacy consulted to dose IV heparin  Pt on no oral anticoagulation PTA  Goal of Therapy:  Heparin level 0.3-0.7 units/ml Monitor platelets by anticoagulation protocol: Yes   Plan:   Obtain baseline aPTT and INR  Heparin 3000 unit IV bolus x 1 followed by infusion @ 1000 units/hr  Check heparin level 8 hr after heparin started  Follow heparin level &CBC daily  Kristin Wilkerson, Joselyn Glassman, PharmD 05/08/2015,6:35 AM

## 2015-05-09 DIAGNOSIS — Z4789 Encounter for other orthopedic aftercare: Secondary | ICD-10-CM | POA: Diagnosis not present

## 2015-05-09 DIAGNOSIS — F419 Anxiety disorder, unspecified: Secondary | ICD-10-CM | POA: Diagnosis not present

## 2015-05-09 DIAGNOSIS — Z66 Do not resuscitate: Secondary | ICD-10-CM | POA: Diagnosis not present

## 2015-05-09 DIAGNOSIS — I4891 Unspecified atrial fibrillation: Secondary | ICD-10-CM | POA: Diagnosis not present

## 2015-05-09 DIAGNOSIS — I5032 Chronic diastolic (congestive) heart failure: Secondary | ICD-10-CM | POA: Diagnosis not present

## 2015-05-09 DIAGNOSIS — I2699 Other pulmonary embolism without acute cor pulmonale: Secondary | ICD-10-CM | POA: Diagnosis not present

## 2015-05-09 DIAGNOSIS — K5730 Diverticulosis of large intestine without perforation or abscess without bleeding: Secondary | ICD-10-CM | POA: Diagnosis not present

## 2015-05-09 DIAGNOSIS — R278 Other lack of coordination: Secondary | ICD-10-CM | POA: Diagnosis not present

## 2015-05-09 DIAGNOSIS — R29898 Other symptoms and signs involving the musculoskeletal system: Secondary | ICD-10-CM | POA: Diagnosis not present

## 2015-05-09 DIAGNOSIS — I10 Essential (primary) hypertension: Secondary | ICD-10-CM | POA: Diagnosis not present

## 2015-05-09 DIAGNOSIS — R609 Edema, unspecified: Secondary | ICD-10-CM | POA: Diagnosis not present

## 2015-05-09 DIAGNOSIS — J309 Allergic rhinitis, unspecified: Secondary | ICD-10-CM | POA: Diagnosis not present

## 2015-05-09 DIAGNOSIS — L299 Pruritus, unspecified: Secondary | ICD-10-CM | POA: Diagnosis not present

## 2015-05-09 DIAGNOSIS — K5900 Constipation, unspecified: Secondary | ICD-10-CM | POA: Diagnosis not present

## 2015-05-09 DIAGNOSIS — K219 Gastro-esophageal reflux disease without esophagitis: Secondary | ICD-10-CM | POA: Diagnosis not present

## 2015-05-09 DIAGNOSIS — M6281 Muscle weakness (generalized): Secondary | ICD-10-CM | POA: Diagnosis not present

## 2015-05-09 DIAGNOSIS — E785 Hyperlipidemia, unspecified: Secondary | ICD-10-CM | POA: Diagnosis not present

## 2015-05-09 DIAGNOSIS — R05 Cough: Secondary | ICD-10-CM | POA: Diagnosis not present

## 2015-05-09 DIAGNOSIS — K648 Other hemorrhoids: Secondary | ICD-10-CM | POA: Diagnosis not present

## 2015-05-09 DIAGNOSIS — R0602 Shortness of breath: Secondary | ICD-10-CM | POA: Diagnosis not present

## 2015-05-09 DIAGNOSIS — L089 Local infection of the skin and subcutaneous tissue, unspecified: Secondary | ICD-10-CM | POA: Diagnosis not present

## 2015-05-09 DIAGNOSIS — Z7901 Long term (current) use of anticoagulants: Secondary | ICD-10-CM | POA: Diagnosis not present

## 2015-05-09 DIAGNOSIS — E559 Vitamin D deficiency, unspecified: Secondary | ICD-10-CM | POA: Diagnosis not present

## 2015-05-09 DIAGNOSIS — L03116 Cellulitis of left lower limb: Secondary | ICD-10-CM | POA: Diagnosis not present

## 2015-05-09 DIAGNOSIS — T8131XD Disruption of external operation (surgical) wound, not elsewhere classified, subsequent encounter: Secondary | ICD-10-CM | POA: Diagnosis not present

## 2015-05-09 DIAGNOSIS — F418 Other specified anxiety disorders: Secondary | ICD-10-CM | POA: Diagnosis not present

## 2015-05-09 DIAGNOSIS — Z471 Aftercare following joint replacement surgery: Secondary | ICD-10-CM | POA: Diagnosis not present

## 2015-05-09 DIAGNOSIS — F0390 Unspecified dementia without behavioral disturbance: Secondary | ICD-10-CM | POA: Diagnosis not present

## 2015-05-09 DIAGNOSIS — S76111D Strain of right quadriceps muscle, fascia and tendon, subsequent encounter: Secondary | ICD-10-CM | POA: Diagnosis not present

## 2015-05-09 DIAGNOSIS — F329 Major depressive disorder, single episode, unspecified: Secondary | ICD-10-CM | POA: Diagnosis not present

## 2015-05-09 DIAGNOSIS — R41841 Cognitive communication deficit: Secondary | ICD-10-CM | POA: Diagnosis not present

## 2015-05-09 DIAGNOSIS — D802 Selective deficiency of immunoglobulin A [IgA]: Secondary | ICD-10-CM | POA: Diagnosis not present

## 2015-05-09 DIAGNOSIS — Z8673 Personal history of transient ischemic attack (TIA), and cerebral infarction without residual deficits: Secondary | ICD-10-CM | POA: Diagnosis not present

## 2015-05-09 DIAGNOSIS — J329 Chronic sinusitis, unspecified: Secondary | ICD-10-CM | POA: Diagnosis not present

## 2015-05-09 DIAGNOSIS — F0391 Unspecified dementia with behavioral disturbance: Secondary | ICD-10-CM | POA: Diagnosis not present

## 2015-05-09 DIAGNOSIS — Z96651 Presence of right artificial knee joint: Secondary | ICD-10-CM | POA: Diagnosis not present

## 2015-05-09 DIAGNOSIS — H409 Unspecified glaucoma: Secondary | ICD-10-CM | POA: Diagnosis not present

## 2015-05-09 DIAGNOSIS — Z515 Encounter for palliative care: Secondary | ICD-10-CM | POA: Diagnosis not present

## 2015-05-09 DIAGNOSIS — R062 Wheezing: Secondary | ICD-10-CM | POA: Diagnosis not present

## 2015-05-09 DIAGNOSIS — Z9181 History of falling: Secondary | ICD-10-CM | POA: Diagnosis not present

## 2015-05-09 DIAGNOSIS — R262 Difficulty in walking, not elsewhere classified: Secondary | ICD-10-CM | POA: Diagnosis not present

## 2015-05-09 LAB — COMPREHENSIVE METABOLIC PANEL
ALK PHOS: 86 U/L (ref 38–126)
ALT: 10 U/L — ABNORMAL LOW (ref 14–54)
ANION GAP: 9 (ref 5–15)
AST: 15 U/L (ref 15–41)
Albumin: 2.5 g/dL — ABNORMAL LOW (ref 3.5–5.0)
BILIRUBIN TOTAL: 0.5 mg/dL (ref 0.3–1.2)
BUN: 15 mg/dL (ref 6–20)
CALCIUM: 8.6 mg/dL — AB (ref 8.9–10.3)
CO2: 24 mmol/L (ref 22–32)
CREATININE: 0.69 mg/dL (ref 0.44–1.00)
Chloride: 112 mmol/L — ABNORMAL HIGH (ref 101–111)
GFR calc non Af Amer: >60 mL/min (ref >60)
GLUCOSE: 96 mg/dL (ref 65–99)
Potassium: 3.1 mmol/L — ABNORMAL LOW (ref 3.5–5.1)
Sodium: 145 mmol/L (ref 135–145)
TOTAL PROTEIN: 5.3 g/dL — AB (ref 6.5–8.1)

## 2015-05-09 LAB — CBC
HEMATOCRIT: 27.3 % — AB (ref 36.0–46.0)
HEMOGLOBIN: 8.5 g/dL — AB (ref 12.0–15.0)
MCH: 28.2 pg (ref 26.0–34.0)
MCHC: 31.1 g/dL (ref 30.0–36.0)
MCV: 90.7 fL (ref 78.0–100.0)
Platelets: 384 10*3/uL (ref 150–400)
RBC: 3.01 MIL/uL — ABNORMAL LOW (ref 3.87–5.11)
RDW: 17.4 % — ABNORMAL HIGH (ref 11.5–15.5)
WBC: 6.5 10*3/uL (ref 4.0–10.5)

## 2015-05-09 LAB — URINE CULTURE: CULTURE: NO GROWTH

## 2015-05-09 LAB — HEPARIN LEVEL (UNFRACTIONATED)
HEPARIN UNFRACTIONATED: 0.85 [IU]/mL — AB (ref 0.30–0.70)
Heparin Unfractionated: 0.69 IU/mL (ref 0.30–0.70)

## 2015-05-09 LAB — GLUCOSE, CAPILLARY: GLUCOSE-CAPILLARY: 94 mg/dL (ref 65–99)

## 2015-05-09 MED ORDER — POTASSIUM CHLORIDE CRYS ER 20 MEQ PO TBCR
40.0000 meq | EXTENDED_RELEASE_TABLET | Freq: Two times a day (BID) | ORAL | Status: DC
  Administered 2015-05-09: 40 meq via ORAL
  Filled 2015-05-09: qty 2

## 2015-05-09 NOTE — Progress Notes (Signed)
Pt transported via [employer] M. Clelia Croft, RN

## 2015-05-09 NOTE — Progress Notes (Signed)
Patient is set to discharge back to [facility] today. Patient & daughter, Eber Jones aware. Discharge packet given to RN, Nehemiah Settle. [facility] called for transport to pickup at 2:15pm.     Lincoln Maxin, LCSW [facility] Clinical Social Worker cell #: [phone]

## 2015-05-09 NOTE — Progress Notes (Signed)
ANTICOAGULATION CONSULT NOTE - follow up  Pharmacy Consult for Heparin Indication: pulmonary embolus  No Known Allergies  Patient Measurements: Height:  (157.5 cm) Weight: 151 lb 12.8 oz (68.856 kg) IBW/kg (Calculated) : 50.1 Heparin Dosing Weight: 62 kg  Vital Signs:    Labs:  Recent Labs  05/08/15 0015 05/08/15 0628 05/08/15 1651 05/09/15 0100 05/09/15 0848  HGB 10.0*  --   --  8.5*  --   HCT 31.5*  --   --  27.3*  --   PLT 384  --   --  384  --   APTT  --  31  --   --   --   LABPROT  --  14.7  --   --   --   INR  --  1.13  --   --   --   HEPARINUNFRC  --   --  0.59 0.69 0.85*  CREATININE 0.93  --   --  0.69  --   TROPONINI <0.03  --   --   --   --     Estimated Creatinine Clearance: 45.1 mL/min (by C-G formula based on Cr of 0.69).   Medical History: Past Medical History  Diagnosis Date  . Hypertension   . Hyperlipidemia   . Anxiety   . Dementia   . Anemia   . CHF (congestive heart failure) (HCC)     Medications:  Scheduled:  . acetaminophen  1,000 mg Oral TID  . atorvastatin  20 mg Oral q1800  . cholecalciferol  1,000 Units Oral Q2000  . DULoxetine  60 mg Oral Daily  . furosemide  20 mg Oral QODAY  . latanoprost  1 drop Both Eyes QHS  . losartan  100 mg Oral Daily  . metoprolol  100 mg Oral BID  . polyethylene glycol  17 g Oral BID  . potassium chloride  40 mEq Oral BID  . QUEtiapine  25 mg Oral QHS  . senna-docusate  2 tablet Oral BID  . sodium chloride  3 mL Intravenous Q12H   Infusions:  . heparin 1,000 Units/hr (05/09/15 0530)    Assessment: 83 yr female presents to ED s/p syncopal episode. CTAngio shows + bilateral pulmonary embolism. Pharmacy consulted to dose IV heparin. Pt on no oral anticoagulation PTA  Today, 05/09/2015  Heparin level supratherapeutic on current rate of 1000 units/hr  Hgb dropped - monitor, Plts stable  Per RN, confirmed rate at 10 ml/hr, no bleeding noted  Goal of Therapy:  Heparin level 0.3-0.7  units/ml Monitor platelets by anticoagulation protocol: Yes   Plan:  1) Reduce IV heparin from 1000 units/hr to 750 units/hr 2) Recheck heparin level 8 hours after rate decrease 3) Daily Heparin level and CBC    Hessie Knows, PharmD, BCPS Pager [phone] 05/09/2015 11:24 AM

## 2015-05-09 NOTE — Progress Notes (Signed)
Protocol: Heparin Indication: PE  Assessment: Pt is currently on Heparin at 1000 units/hr. HL=0.69 (goal 0.3-0.7) which is therapeutic. No additional bleeding from knee per RN. Heparin was briefly off for ~ 10 mins ~2130. Plan: Will continue heparin at currrent rate of 1000 units/hr. Recheck HL in am  Thanks, Lorenza Evangelist 05/09/2015

## 2015-05-09 NOTE — Progress Notes (Signed)
Report called to [place] at [employer].  Earnest Conroy. Clelia Croft, RN

## 2015-05-09 NOTE — Progress Notes (Signed)
Daily Progress Note   Patient Name: Kristin Wilkerson       Date: 05/09/2015 DOB: Nov 25, 1925  Age: 82 y.o. MRN#: [id number] Attending Physician: Jerald Kief, MD Primary Care Physician: Kimber Relic, MD Admit Date: 05/07/2015  Reason for Consultation/Follow-up: Establishing goals of care  Subjective: Spoke with Daughter, Eber Jones, on the phone.  She reports her mother has been seeing her deceased husband more frequently recently.  She reports that the patient's knee wound will not heal and continues to bleed.  She expressed a clear desire to move away from aggressive medical care towards comfort care and increased quality of life. She would like for her mother to return to [facility] as early as possible.  Eber Jones indicates that her mother has a high level of anxiety, and when necessary Xanax helps with this.  She states that her mother has both Medicare and Medicaid benefit and that when her mother is eligible for hospice she would like to engage hospice services. I recommended that palliative medicine follow the patient at SNF for increased quality of life until she is hospice eligible.     Length of Stay: 1 day  Current Medications: Scheduled Meds:  . acetaminophen  1,000 mg Oral TID  . atorvastatin  20 mg Oral q1800  . cholecalciferol  1,000 Units Oral Q2000  . DULoxetine  60 mg Oral Daily  . furosemide  20 mg Oral QODAY  . latanoprost  1 drop Both Eyes QHS  . losartan  100 mg Oral Daily  . metoprolol  100 mg Oral BID  . polyethylene glycol  17 g Oral BID  . potassium chloride  40 mEq Oral BID  . QUEtiapine  25 mg Oral QHS  . senna-docusate  2 tablet Oral BID  . sodium chloride  3 mL Intravenous Q12H    Continuous Infusions: . heparin 1,000 Units/hr (05/09/15 0530)     PRN Meds: ALPRAZolam, hydrocortisone, ondansetron **OR** ondansetron (ZOFRAN) IV  Physical Exam          Pleasantly demented elderly female, NAD CV RRR no m/r/g Resp: CTA, decreased breath sounds, no pain with inspiration Abdomen: Soft, nt, nd, +bs Extremities: Bilateral lower extremity swelling (2-3+), mild anasarca.  LLE in black stabilization wrap.  Vital Signs: BP 141/91 mmHg  Pulse 76  Temp(Src) 98  F (36.7 C) (Oral)  Resp 16  Ht  (1.575 m)  Wt 68.856 kg (151 lb 12.8 oz)  BMI 27.76 kg/m2  SpO2 98% SpO2: SpO2: 98 % O2 Device: O2 Device: Not Delivered O2 Flow Rate:    Intake/output summary:  Intake/Output Summary (Last 24 hours) at 05/09/15 0936 Last data filed at 05/09/15 0600  Gross per 24 hour  Intake 351.58 ml  Output      0 ml  Net 351.58 ml   LBM: Last BM Date: 05/09/15 Baseline Weight: Weight: 68.856 kg (151 lb 12.8 oz) Most recent weight: Weight: 68.856 kg (151 lb 12.8 oz)       Palliative Assessment/Data: Flowsheet Rows        Most Recent Value   Intake Tab    Referral Department  Hospitalist   Unit at Time of Referral  Med/Surg Unit   Palliative Care Primary Diagnosis  Neurology   Date Notified  05/08/15   Palliative Care Type  New Palliative care   Reason for referral  Clarify Goals of Care   Date of Admission  05/08/15   Date first seen by Palliative Care  05/08/15   # of days Palliative referral response time  0 Day(s)   # of days IP prior to Palliative referral  0   Clinical Assessment    Palliative Performance Scale Score  50%   Dyspnea Max Last 24 Hours  4   Psychosocial & Spiritual Assessment    Palliative Care Outcomes       Additional Data Reviewed: CBC    Component Value Date/Time   WBC 6.5 05/09/2015 0100   WBC 9.1 02/17/2015   RBC 3.01* 05/09/2015 0100   RBC 4.41 09/18/2012 1641   HGB 8.5* 05/09/2015 0100   HCT 27.3* 05/09/2015 0100   PLT 384 05/09/2015 0100   MCV 90.7 05/09/2015 0100   MCH 28.2 05/09/2015  0100   MCH 30.2 09/18/2012 1641   MCHC 31.1 05/09/2015 0100   MCHC 32.9 09/18/2012 1641   RDW 17.4* 05/09/2015 0100   RDW 13.9 09/18/2012 1641   LYMPHSABS 1.5 05/08/2015 0015   LYMPHSABS 1.9 09/18/2012 1641   MONOABS 0.8 05/08/2015 0015   EOSABS 0.2 05/08/2015 0015   EOSABS 0.4 09/18/2012 1641   BASOSABS 0.0 05/08/2015 0015   BASOSABS 0.0 09/18/2012 1641    CMP     Component Value Date/Time   NA 145 05/09/2015 0100   NA 143 02/17/2015   K 3.1* 05/09/2015 0100   CL 112* 05/09/2015 0100   CO2 24 05/09/2015 0100   GLUCOSE 96 05/09/2015 0100   GLUCOSE 90 02/21/2014 1046   BUN 15 05/09/2015 0100   BUN 32* 02/17/2015   CREATININE 0.69 05/09/2015 0100   CREATININE 0.9 02/17/2015   CALCIUM 8.6* 05/09/2015 0100   PROT 5.3* 05/09/2015 0100   PROT 6.7 02/21/2014 1046   ALBUMIN 2.5* 05/09/2015 0100   ALBUMIN 4.4 02/21/2014 1046   AST 15 05/09/2015 0100   ALT 10* 05/09/2015 0100   ALKPHOS 86 05/09/2015 0100   BILITOT 0.5 05/09/2015 0100   GFRNONAA >60 05/09/2015 0100   GFRAA >60 05/09/2015 0100       Problem List:  Patient Active Problem List   Diagnosis Date Noted  . Pulmonary embolism (HCC) 05/08/2015  . Anxiety and depression 05/08/2015  . Chronic diastolic CHF (congestive heart failure), NYHA class 1 (HCC) 05/08/2015  . Benign essential HTN 05/08/2015  . Dyslipidemia 05/08/2015  . Normocytic anemia 05/08/2015  .  Syncope 05/08/2015  . DNR (do not resuscitate)   . Palliative care encounter   . Pulmonary embolism, bilateral (HCC)   . Dementia with behavioral disturbance 05/01/2015     Palliative Care Assessment & Plan    1.Code Status:  DNR    Code Status Orders        Start     Ordered   05/08/15 1639  Do not attempt resuscitation (DNR)   Continuous    Question Answer Comment  In the event of cardiac or respiratory ARREST Do not call a "code blue"   In the event of cardiac or respiratory ARREST Do not perform Intubation, CPR, defibrillation or ACLS    In the event of cardiac or respiratory ARREST Use medication by any route, position, wound care, and other measures to relive pain and suffering. May use oxygen, suction and manual treatment of airway obstruction as needed for comfort.      05/08/15 1639    Code Status History    Date Active Date Inactive Code Status Order ID Comments User Context   05/08/2015  8:07 AM 05/08/2015  4:39 PM Full Code [id number]  Alison Murray, MD ED   04/21/2015  4:51 PM 04/24/2015  5:34 PM DNR [id number]  Alfredo Batty, RN Inpatient   04/21/2015  4:37 PM 04/21/2015  4:50 PM Full Code [id number]  Ranee Gosselin, MD Inpatient   03/01/2015  4:11 PM 03/02/2015  6:34 PM Full Code [id number]  Dimitri Ped, PA-C ED   02/11/2015 12:07 PM 02/14/2015  2:27 PM Full Code [id number]  Ranee Gosselin, MD Inpatient   02/28/2013  9:08 PM 03/02/2013  3:17 PM Full Code [id number]  Hillary Bow, DO ED    Advance Directive Documentation        Most Recent Value   Type of Advance Directive  Out of facility DNR (pink MOST or yellow form)   Pre-existing out of facility DNR order (yellow form or pink MOST form)  Yellow form placed in chart (order not valid for inpatient use)   "MOST" Form in Place?         2. Goals of Care/Additional Recommendations:  DNR / DNI  Focus towards comfort and increased quality of life.  Family does not desire aggressive medical interventions  Palliative to follow at SNF (Please include in D/C summary)  SW to provide education on Hospice in order for family to engage hospice at [facility] when appropriate.  3. Symptom Management:      Anxiety:  Xanax .25 QID PRN.  Consider scheduling BID with PRN for breakthru symptoms.  4. Palliative Prophylaxis:   Aspiration, Bowel Regimen, Delirium Protocol, Frequent Pain Assessment and Palliative Wound Care  5. Prognosis: Unable to determine.   Patient is at risk for an acute event.  Admitted with bilateral PEs.  Currently a poor candidate for anticoagulation.  6.  Discharge Planning:  Skilled Nursing Facility for rehab with Palliative care service follow-up   Care plan was discussed with daughter Eber Jones  Thank you for allowing the Palliative Medicine Team to assist in the care of this patient.   Time In: 930 Time Out: 1000 Total Time 30 min Prolonged Time Billed  no        Stephani Police, PA-C  05/09/2015, 9:36 AM  Please contact Palliative Medicine Team phone at [phone] for questions and concerns.

## 2015-05-09 NOTE — Discharge Summary (Signed)
Physician Discharge Summary  Kristin Wilkerson ZOX:[id number] DOB: Aug 15, 1925 DOA: 05/07/2015  PCP: Kimber Relic, MD  Admit date: 05/07/2015 Discharge date: 05/09/2015  Time spent: 20 minutes  Recommendations for Outpatient Follow-up:  1. Follow up with PCP in 1-2 weeks 2. Recommend re-evaluation for consideration for starting therapeutic xarelto or eliquis (see below)  3. Recommend repeat CBC in 1 week  Discharge Diagnoses:  Principal Problem:   Syncope Active Problems:   Dementia with behavioral disturbance   Pulmonary embolism (HCC)   Anxiety and depression   Chronic diastolic CHF (congestive heart failure), NYHA class 1 (HCC)   Benign essential HTN   Dyslipidemia   Normocytic anemia   DNR (do not resuscitate)   Palliative care encounter   Pulmonary embolism, bilateral (HCC)   Discharge Condition: Stable  Diet recommendation: Regular  Filed Weights   05/08/15 1602  Weight: 68.856 kg (151 lb 12.8 oz)    History of present illness:  Please review dictated H and P from 1/19 for details. Briefly, 82 year old female with past medical history of anxiety and depression, dementia, hypertension, dyslipidemia, chronic diastolic congestive heart failure (2-D echo in October 2016 showed normal systolic function with grade 1 diastolic dysfunction), from skilled nursing facility who presented to [facility] long hospital with syncopal episode. On work up, patient was found to have bilateral lower lobe pulmonary artery emboli with no evidence of R heart strain. Patient was admitted for further work up.  Hospital Course:   Syncope / Pulmonary embolism (HCC) - Syncopal event likely secondary to new finding of pulmonary embolism - Heparin drip was initiated at the time of the admission, in ED - Because of patient's dementia and risk of fall she may not be a good candidate for long term anticoagulation other than continuation of aspirin she was taking in skilled nursing facility - Palliative  care was consulted for goals of care - Discussed with patient and pt's daughter, Eber Jones, at length. They are all aware of PE findings and potential adverse effects including death. Risks and benefits to anticoagulation presented to all. Unanimous decision by pt and family to hold off on further anticoagulation until fall/bleeding risks improve in the near future. All are also aware of risks to not anticoagulating. For now, would at the very least continue ASA per home regimen   Dementia with behavioral disturbance - So far stable behavior - She is confused, disoriented   Anxiety and depression - Continue Cymbalta and Seroquel   Chronic diastolic CHF (congestive heart failure), NYHA class 1 (HCC) - 2-D echo in October 2016 showed normal ejection fraction with grade 1 diastolic dysfunction - Stable respiratory status thus far - No evidence of right heart strain on CT angiogram of the chest   Benign essential HTN - Resumed metoprolol, losartan and Lasix - Renal function is within normal limits   Dyslipidemia - Resumed statin therapy   Normocytic anemia - Hemoglobin was 10 on the admission, follow up hgb 8.5, but seemed to be pt's baseline hgb since prior to admission. - No evidence of bleeding this admission  Consultations:  Palliative Care  Discharge Exam: Filed Vitals:   05/08/15 1323 05/08/15 1520 05/08/15 1602 05/08/15 2300  BP: 169/64 131/84 162/70 141/91  Pulse: 70 78 80 76  Temp:  97.4 F (36.3 C) 97.7 F (36.5 C) 98 F (36.7 C)  TempSrc:  Oral Oral Oral  Resp: 14 14 16 16   Height:   5\' 2"  (1.575 m)   Weight:  68.856 kg (151 lb 12.8 oz)   SpO2: 100% 99% 100% 98%    General: awake, in nad Cardiovascular: regular, s1, s2 Respiratory: normal resp effort, no wheezing  Discharge Instructions     Medication List    STOP taking these medications        doxycycline 100 MG tablet  Commonly known as:  VIBRA-TABS      TAKE these medications         acetaminophen 500 MG tablet  Commonly known as:  TYLENOL  Take 1,000 mg by mouth 3 (three) times daily.     ALPRAZolam 0.25 MG tablet  Commonly known as:  XANAX  Take 0.25 mg by mouth every 4 (four) hours as needed for anxiety.     aspirin 325 MG tablet  Take 325 mg by mouth daily.     atorvastatin 20 MG tablet  Commonly known as:  LIPITOR  TAKE 1 TABLET BY MOUTH EVERY EVENING TO CONTROL CHOLESTEROL     Calcium Carbonate-Vitamin D 600-400 MG-UNIT tablet  Commonly known as:  CALCIUM 600+D  Take 1 tablet by mouth daily.     cholecalciferol 1000 units tablet  Commonly known as:  VITAMIN D  Take 1,000 Units by mouth daily at 8 pm.     DULoxetine 60 MG capsule  Commonly known as:  CYMBALTA  Take 1 capsule (60 mg total) by mouth daily. One daily to help nerves     furosemide 20 MG tablet  Commonly known as:  LASIX  Take 1 tablet (20 mg total) by mouth every other day.     hydrocortisone 2.5 % rectal cream  Commonly known as:  ANUSOL-HC  Place 1 application rectally 2 (two) times daily as needed for hemorrhoids or itching.     latanoprost 0.005 % ophthalmic solution  Commonly known as:  XALATAN  PLACE 1 DROP IN BOTH EYES AT BEDTIME TO TREAT GLAUCOMA     losartan 100 MG tablet  Commonly known as:  COZAAR  TAKE 1 TABLET BY MOUTH ONCE DAILY FOR BLOOD PRESSURE     metoprolol 100 MG tablet  Commonly known as:  LOPRESSOR  TAKE 1 TABLET BY MOUTH TWICE DAILY TO CONTORL BLOOD PRESSURE     polyethylene glycol packet  Commonly known as:  MIRALAX / GLYCOLAX  Take 17 g by mouth 2 (two) times daily.     QUEtiapine 25 MG tablet  Commonly known as:  SEROQUEL  One tab po every night for one week, then 2 tabs po every night     sennosides-docusate sodium 8.6-50 MG tablet  Commonly known as:  SENOKOT-S  Take 2 tablets by mouth 2 (two) times daily.     traMADol 50 MG tablet  Commonly known as:  ULTRAM  Take 1-2 tablets (50-100 mg total) by mouth every 6 (six) hours as needed for  moderate pain.       No Known Allergies Follow-up Information    Follow up with GREEN, [name], MD. Schedule an appointment as soon as possible for a visit in 1 day.   Specialty:  Internal Medicine   Contact information:   [address] [place] [address] [phone]        The results of significant diagnostics from this hospitalization (including imaging, microbiology, ancillary and laboratory) are listed below for reference.    Significant Diagnostic Studies: Dg Chest 1 View  05/08/2015  CLINICAL DATA:  82 year old female with syncope EXAM: CHEST 1 VIEW COMPARISON:  Radiograph dated 04/17/2015  FINDINGS: Single view of the chest demonstrates small focal density at the left costophrenic angle, likely a subsegmental atelectatic changes. Pneumonia is less likely but not excluded. Clinical correlation is recommended. There is a focal area nodularity and increased vascularity at the right lung base. No significant pleural effusion. No pneumothorax. Stable cardiac silhouette. No acute osseous pathology. IMPRESSION: Bibasilar densities and vascular prominence, likely atelectatic changes. Pneumonia is not excluded. Clinical correlation is recommended. Electronically Signed   By: Elgie Collard M.D.   On: 05/08/2015 01:22   Dg Chest 2 View  04/17/2015  CLINICAL DATA:  Preoperative total knee arthroplasty.  Hypertension. EXAM: CHEST  2 VIEW COMPARISON:  March 29, 2015 FINDINGS: There is no edema or consolidation. The heart size and pulmonary vascularity are normal. No adenopathy. There is mild degenerative change in the thoracic spine. IMPRESSION: No edema or consolidation. Electronically Signed   By: Bretta Bang III M.D.   On: 04/17/2015 13:54   Ct Head Wo Contrast  05/08/2015  CLINICAL DATA:  82 year old female with syncope EXAM: CT HEAD WITHOUT CONTRAST TECHNIQUE: Contiguous axial images were obtained from the base of the skull through the vertex without intravenous  contrast. COMPARISON:  Head CT dated 04/23/2015 FINDINGS: The ventricles are dilated and the sulci are prominent compatible with age-related atrophy. Mild Periventricular and deep white matter hypodensities represent chronic microvascular ischemic changes. Stable focal right cerebellar old infarct and encephalomalacia. There is no intracranial hemorrhage. No mass effect or midline shift identified. There is partial opacification of the left sphenoid sinus. The remainder of the paranasal sinuses and mastoid air cells are well aerated. The calvarium is intact. IMPRESSION: No acute intracranial hemorrhage. Age-related atrophy and chronic microvascular ischemic disease. Stable old right cerebellar infarct. If symptoms persist and there are no contraindications, MRI may provide better evaluation if clinically indicated. Electronically Signed   By: Elgie Collard M.D.   On: 05/08/2015 01:15   Ct Head Wo Contrast  04/23/2015  CLINICAL DATA:  Altered mental status. History of previous CVA. Cognitive decline for 2 months. Multiple falls resulting in dehiscence of RIGHT total knee arthroplasty incision, requiring surgery. EXAM: CT HEAD WITHOUT CONTRAST TECHNIQUE: Contiguous axial images were obtained from the base of the skull through the vertex without intravenous contrast. COMPARISON:  03/29/2015 most recent CT.  Prior MR head 02/28/2013. FINDINGS: Generalized cerebral and cerebellar atrophy, moderately advanced for age. Hypoattenuation of white matter, likely chronic microvascular ischemic change. No evidence for acute stroke, acute hemorrhage, mass lesion, or extra-axial fluid. Hydrocephalus ex vacuo. Remote RIGHT occipital infarct with encephalomalacia; this infarct was acute in 2014. Vascular calcification in the carotid siphon and vertebral regions. Transverse arch calcification at C1-2. There is layering fluid in the sphenoid sinus, which was present on the prior most recent scan from 03/29/2015. No signs of  basilar skull fracture. BILATERAL cataract extraction. No mastoid or middle ear fluid. Tiny RIGHT frontal scalp calcification. No significant scalp hematoma or laceration. RIGHT parietal sebaceous cyst. IMPRESSION: Chronic changes as described. No skull fracture or intracranial hemorrhage. No acute intracranial findings are seen which might contribute to altered mental status. Electronically Signed   By: Elsie Stain M.D.   On: 04/23/2015 13:43   Ct Angio Chest Pe W/cm &/or Wo Cm  05/08/2015  CLINICAL DATA:  82 year old female with recent right knee surgery presenting with syncope and tachycardia. EXAM: CT ANGIOGRAPHY CHEST WITH CONTRAST TECHNIQUE: Multidetector CT imaging of the chest was performed using the standard protocol during bolus administration of intravenous contrast.  Multiplanar CT image reconstructions and MIPs were obtained to evaluate the vascular anatomy. CONTRAST:  OMNIPAQUE IOHEXOL 350 MG/ML SOLN COMPARISON:  Chest radiograph dated 05/08/2015 FINDINGS: Minimal bibasilar dependent atelectatic changes. There is no focal consolidation, pleural effusion, or pneumothorax. The central airways are patent. Mild atherosclerotic calcification of the aorta. No evidence of thoracic aortic aneurysm or dissection. There are pulmonary emboli involving the bilateral lower lobe segmental branch point. The right lower lobe embolus extends into the subsegmental pulmonary artery branches. Small right middle lobe pulmonary artery embolus noted. There is no cardiomegaly or pericardial effusion. No CT evidence of right cardiac straining. There is no mediastinal adenopathy. Top-normal bilateral hilar lymph nodes noted. There is a moderate size hiatal hernia. The esophagus is grossly unremarkable. There is no axillary adenopathy. The chest wall soft tissues appear unremarkable. There is degenerative changes of the spine and multiple old left anterior rib fractures. No acute fracture. The visualized upper abdomen  appears unremarkable. Review of the MIP images confirms the above findings. IMPRESSION: Bilateral lower lobe pulmonary artery emboli. No CT evidence of right heart strain. These results were called by telephone at the time of interpretation on 05/08/2015 at 6:16 am to Nurse Talkington, who verbally acknowledged these results. Electronically Signed   By: Elgie Collard M.D.   On: 05/08/2015 06:18    Microbiology: Recent Results (from the past 240 hour(s))  Urine culture     Status: None   Collection Time: 05/08/15  3:22 AM  Result Value Ref Range Status   Specimen Description URINE, CATHETERIZED  Final   Special Requests NONE  Final   Culture   Final    NO GROWTH 1 DAY Performed at [facility]    Report Status 05/09/2015 FINAL  Final     Labs: Basic Metabolic Panel:  Recent Labs Lab 05/08/15 0015 05/09/15 0100  NA 141 145  K 3.8 3.1*  CL 107 112*  CO2 23 24  GLUCOSE 123* 96  BUN 25* 15  CREATININE 0.93 0.69  CALCIUM 9.0 8.6*   Liver Function Tests:  Recent Labs Lab 05/09/15 0100  AST 15  ALT 10*  ALKPHOS 86  BILITOT 0.5  PROT 5.3*  ALBUMIN 2.5*   No results for input(s): LIPASE, AMYLASE in the last 168 hours. No results for input(s): AMMONIA in the last 168 hours. CBC:  Recent Labs Lab 05/08/15 0015 05/09/15 0100  WBC 9.9 6.5  NEUTROABS 7.4  --   HGB 10.0* 8.5*  HCT 31.5* 27.3*  MCV 90.5 90.7  PLT 384 384   Cardiac Enzymes:  Recent Labs Lab 05/08/15 0015  TROPONINI <0.03   BNP: BNP (last 3 results) No results for input(s): BNP in the last 8760 hours.  ProBNP (last 3 results) No results for input(s): PROBNP in the last 8760 hours.  CBG:  Recent Labs Lab 05/08/15 2222 05/09/15 0723  GLUCAP 102* 94     Signed:  CHIU, STEPHEN K  [facility] 05/09/2015, 12:43 PM

## 2015-05-18 ENCOUNTER — Telehealth: Payer: Self-pay | Admitting: Neurology

## 2015-05-18 NOTE — Telephone Encounter (Signed)
Pt's son, Greig Castilla, called and would like to set up a time to have a notary come and have a letter signed. He wants to know what times are good for Dr. Terrace Arabia . He says he only needs about 10 min or so. Please call and advise [phone], Greig Castilla.

## 2015-05-18 NOTE — Telephone Encounter (Addendum)
Kristin Wilkerson's correct number is [phone].  He will call us back with more detail, after he speaks with his attorney.  He is needing the physician letter, already provided to him, to be signed again by Dr. Terrace Arabia with a notary present (this is a requirement of one of her financial institutions).

## 2015-05-19 DIAGNOSIS — T8131XD Disruption of external operation (surgical) wound, not elsewhere classified, subsequent encounter: Secondary | ICD-10-CM | POA: Diagnosis not present

## 2015-05-19 DIAGNOSIS — Z4789 Encounter for other orthopedic aftercare: Secondary | ICD-10-CM | POA: Diagnosis not present

## 2015-05-26 DIAGNOSIS — T8131XD Disruption of external operation (surgical) wound, not elsewhere classified, subsequent encounter: Secondary | ICD-10-CM | POA: Diagnosis not present

## 2015-05-26 DIAGNOSIS — Z4789 Encounter for other orthopedic aftercare: Secondary | ICD-10-CM | POA: Diagnosis not present

## 2015-05-26 NOTE — Telephone Encounter (Signed)
Greig Castilla called to make sure Dr. Terrace Arabia will be here all week until 5:00(Mon-Thurs). Could you please call him to confirm or set up a date.  Thanks!

## 2015-05-26 NOTE — Telephone Encounter (Signed)
Spoke to Kristin Wilkerson and he is arranging for the notary to come with him to the office.

## 2015-05-27 ENCOUNTER — Telehealth: Payer: Self-pay | Admitting: Neurology

## 2015-05-27 ENCOUNTER — Telehealth: Payer: Self-pay | Admitting: *Deleted

## 2015-05-28 NOTE — Telephone Encounter (Signed)
Signed letter to confirm her diagnosis of dementia under notary

## 2015-06-05 DIAGNOSIS — T8131XD Disruption of external operation (surgical) wound, not elsewhere classified, subsequent encounter: Secondary | ICD-10-CM | POA: Diagnosis not present

## 2015-06-05 DIAGNOSIS — F0391 Unspecified dementia with behavioral disturbance: Secondary | ICD-10-CM | POA: Diagnosis not present

## 2015-06-05 DIAGNOSIS — R062 Wheezing: Secondary | ICD-10-CM | POA: Diagnosis not present

## 2015-06-05 DIAGNOSIS — F329 Major depressive disorder, single episode, unspecified: Secondary | ICD-10-CM | POA: Diagnosis not present

## 2015-06-09 DIAGNOSIS — T8131XD Disruption of external operation (surgical) wound, not elsewhere classified, subsequent encounter: Secondary | ICD-10-CM | POA: Diagnosis not present

## 2015-06-09 DIAGNOSIS — Z4789 Encounter for other orthopedic aftercare: Secondary | ICD-10-CM | POA: Diagnosis not present

## 2015-06-09 DIAGNOSIS — Z96651 Presence of right artificial knee joint: Secondary | ICD-10-CM | POA: Diagnosis not present

## 2015-06-16 DIAGNOSIS — Z96651 Presence of right artificial knee joint: Secondary | ICD-10-CM | POA: Diagnosis not present

## 2015-06-16 DIAGNOSIS — T8131XD Disruption of external operation (surgical) wound, not elsewhere classified, subsequent encounter: Secondary | ICD-10-CM | POA: Diagnosis not present

## 2015-06-16 DIAGNOSIS — Z471 Aftercare following joint replacement surgery: Secondary | ICD-10-CM | POA: Diagnosis not present

## 2015-06-30 DIAGNOSIS — Z471 Aftercare following joint replacement surgery: Secondary | ICD-10-CM | POA: Diagnosis not present

## 2015-06-30 DIAGNOSIS — T8131XD Disruption of external operation (surgical) wound, not elsewhere classified, subsequent encounter: Secondary | ICD-10-CM | POA: Diagnosis not present

## 2015-06-30 DIAGNOSIS — L03116 Cellulitis of left lower limb: Secondary | ICD-10-CM | POA: Diagnosis not present

## 2015-07-14 DIAGNOSIS — Z96651 Presence of right artificial knee joint: Secondary | ICD-10-CM | POA: Diagnosis not present

## 2015-07-14 DIAGNOSIS — T8131XD Disruption of external operation (surgical) wound, not elsewhere classified, subsequent encounter: Secondary | ICD-10-CM | POA: Diagnosis not present

## 2015-07-28 DIAGNOSIS — Z471 Aftercare following joint replacement surgery: Secondary | ICD-10-CM | POA: Diagnosis not present

## 2015-07-28 DIAGNOSIS — T8131XD Disruption of external operation (surgical) wound, not elsewhere classified, subsequent encounter: Secondary | ICD-10-CM | POA: Diagnosis not present

## 2015-07-28 DIAGNOSIS — Z96651 Presence of right artificial knee joint: Secondary | ICD-10-CM | POA: Diagnosis not present

## 2015-07-30 ENCOUNTER — Ambulatory Visit (INDEPENDENT_AMBULATORY_CARE_PROVIDER_SITE_OTHER): Payer: Medicare Other | Admitting: Adult Health

## 2015-07-30 ENCOUNTER — Encounter: Payer: Self-pay | Admitting: Adult Health

## 2015-07-30 VITALS — BP 121/68 | HR 74 | Ht 62.0 in

## 2015-07-30 DIAGNOSIS — Z8673 Personal history of transient ischemic attack (TIA), and cerebral infarction without residual deficits: Secondary | ICD-10-CM

## 2015-07-30 DIAGNOSIS — F0391 Unspecified dementia with behavioral disturbance: Secondary | ICD-10-CM

## 2015-07-30 DIAGNOSIS — F03918 Unspecified dementia, unspecified severity, with other behavioral disturbance: Secondary | ICD-10-CM

## 2015-07-30 MED ORDER — MEMANTINE HCL 28 X 5 MG & 21 X 10 MG PO TABS: ORAL_TABLET | ORAL | Status: DC

## 2015-07-30 NOTE — Progress Notes (Signed)
I agree with the above plan 

## 2015-07-30 NOTE — Patient Instructions (Signed)
Try Namenda for memory  Memantine Tablets What is this medicine? MEMANTINE (MEM an teen) is used to treat dementia caused by Alzheimer's disease. This medicine may be used for other purposes; ask your health care provider or pharmacist if you have questions. What should I tell my health care provider before I take this medicine? They need to know if you have any of these conditions: -difficulty passing urine -kidney disease -liver disease -seizures -an unusual or allergic reaction to memantine, other medicines, foods, dyes, or preservatives -pregnant or trying to get pregnant -breast-feeding How should I use this medicine? Take this medicine by mouth with a glass of water. Follow the directions on the prescription label. You may take this medicine with or without food. Take your doses at regular intervals. Do not take your medicine more often than directed. Continue to take your medicine even if you feel better. Do not stop taking except on the advice of your doctor or health care professional. Talk to your pediatrician regarding the use of this medicine in children. Special care may be needed. Overdosage: If you think you have taken too much of this medicine contact a poison control center or emergency room at once. NOTE: This medicine is only for you. Do not share this medicine with others. What if I miss a dose? If you miss a dose, take it as soon as you can. If it is almost time for your next dose, take only that dose. Do not take double or extra doses. If you do not take your medicine for several days, contact your health care provider. Your dose may need to be changed. What may interact with this medicine? -acetazolamide -amantadine -cimetidine -dextromethorphan -dofetilide -hydrochlorothiazide -ketamine -metformin -methazolamide -quinidine -ranitidine -sodium bicarbonate -triamterene This list may not describe all possible interactions. Give your health care provider a list  of all the medicines, herbs, non-prescription drugs, or dietary supplements you use. Also tell them if you smoke, drink alcohol, or use illegal drugs. Some items may interact with your medicine. What should I watch for while using this medicine? Visit your doctor or health care professional for regular checks on your progress. Check with your doctor or health care professional if there is no improvement in your symptoms or if they get worse. You may get drowsy or dizzy. Do not drive, use machinery, or do anything that needs mental alertness until you know how this drug affects you. Do not stand or sit up quickly, especially if you are an older patient. This reduces the risk of dizzy or fainting spells. Alcohol can make you more drowsy and dizzy. Avoid alcoholic drinks. What side effects may I notice from receiving this medicine? Side effects that you should report to your doctor or health care professional as soon as possible: -allergic reactions like skin rash, itching or hives, swelling of the face, lips, or tongue -agitation or a feeling of restlessness -depressed mood -dizziness -hallucinations -redness, blistering, peeling or loosening of the skin, including inside the mouth -seizures -vomiting Side effects that usually do not require medical attention (report to your doctor or health care professional if they continue or are bothersome): -constipation -diarrhea -headache -nausea -trouble sleeping This list may not describe all possible side effects. Call your doctor for medical advice about side effects. You may report side effects to FDA at 1-800-FDA-1088. Where should I keep my medicine? Keep out of the reach of children. Store at room temperature between 15 degrees and 30 degrees C (59 degrees and 86  degrees F). Throw away any unused medicine after the expiration date. NOTE: This sheet is a summary. It may not cover all possible information. If you have questions about this medicine,  talk to your doctor, pharmacist, or health care provider.    2016, [employer]vier/[employer] Standard. (2013-01-21 14:10:42)

## 2015-07-30 NOTE — Progress Notes (Signed)
PATIENT: Kristin Wilkerson DOB: 1925/06/17  REASON FOR VISIT: follow up- memory disturbance, stroke HISTORY FROM: patient  HISTORY OF PRESENT ILLNESS: Kristin Wilkerson is an 82 year old female with a history of memory disturbance and stroke. She returns today for follow-up. The daughter reports that the patient is now on Coumadin. She lives at skilled nursing facility. Daughter continues to notice some issues with her memory. She continues to have hallucinations. She was on Seroquel however caused her to be extremely sleepy so the physician at the skilled nursing facility discontinue this medication. The patient's hallucinations are not violent or fearful. She is able to help with some of her ADLs but does require assistance. The patient is primarily in a wheelchair due to an injury on the right leg. The patient has frequent pain due to this injury. Patient returns today for an evaluation.   HISTORY 05/01/15 (MM): Kristin Wilkerson is 82 years old right-handed female, accompanied by her daughter, and son at today's clinical visit, she was a patient of Dr. Pearlean BrownieSethi for stroke, last clinical visit was in April 2016  She had a history of hypertension, hyperlipidemia, suffered a right occipital stroke in November 2014, patient was on aspirin 81 mg daily when the stroke happened, I have personally reviewed MRI of brain in November 2014, right occipital acute DWI relation, moderate generalized atrophy, MRA of the brain showed some irregularity at intra-cranial vessels, related intracranial atherosclerotic disease, no large vessel disease. Echocardiogram showed ejection fraction 55-60%, no cardioembolic source, ultrasound of carotid artery showed 1-39% bilateral internal carotid artery stenosis. She was discharged on Plavix,  She used to live at home, has mild baseline memory trouble, but still highly functional, she underwent right knee replacement in February 11 2015, was [facility], she fell  multiple times, injured her right knee, has to second right knee surgery in January fifth 2017, she is now at different facility[facility], she is no longer ambulatory, wheelchair bound.   During the process, she was found to have increased confusion, anxiety, develop visual hallucinations, she saw her sister who passed away 5 years ago, double was sending text message to her, she has been taking Cymbalta to 30 mg daily for many years, she seems to respond to low-dose Xanax for a while, her hospital course was also complicated by UTI, pneumonia.  Her sons are in the process of finalizing her power of attorney paperwork, she has DO NOT RESUSCITATE order on file, she has good appetite, increased agitation confusion and hallucination at evening time.  REVIEW OF SYSTEMS: Out of a complete 14 system review of symptoms, the patient complains only of the following symptoms, and all other reviewed systems are negative.  Leg swelling, behavior problem, confusion, decreased concentration, depression, nervous/anxious, hallucinations, wounds, memory loss, excessive thirst  ALLERGIES: No Known Allergies  HOME MEDICATIONS: Outpatient Prescriptions Prior to Visit  Medication Sig Dispense Refill  . acetaminophen (TYLENOL) 500 MG tablet Take 1,000 mg by mouth 3 (three) times daily.    [name]. ALPRAZolam (XANAX) 0.25 MG tablet Take 0.25 mg by mouth every 4 (four) hours as needed for anxiety.    . cholecalciferol (VITAMIN D) 1000 UNITS tablet Take 1,000 Units by mouth daily at 8 pm.     . DULoxetine (CYMBALTA) 60 MG capsule Take 1 capsule (60 mg total) by mouth daily. One daily to help nerves 30 capsule 6  . furosemide (LASIX) 20 MG tablet Take 1 tablet (20 mg total) by mouth  every other day. 30 tablet 5  . hydrocortisone (ANUSOL-HC) 2.5 % rectal cream Place 1 application rectally 2 (two) times daily as needed for hemorrhoids or itching.    . latanoprost (XALATAN) 0.005 % ophthalmic solution  PLACE 1 DROP IN BOTH EYES AT BEDTIME TO TREAT GLAUCOMA 7.5 mL 2  . losartan (COZAAR) 100 MG tablet TAKE 1 TABLET BY MOUTH ONCE DAILY FOR BLOOD PRESSURE (Patient taking differently: TAKE 1/2 TABLET BY MOUTH ONCE DAILY FOR BLOOD PRESSURE) 90 tablet 1  . metoprolol (LOPRESSOR) 100 MG tablet TAKE 1 TABLET BY MOUTH TWICE DAILY TO CONTORL BLOOD PRESSURE 180 tablet 1  . sennosides-docusate sodium (SENOKOT-S) 8.6-50 MG tablet Take 2 tablets by mouth 2 (two) times daily.    [name] aspirin 325 MG tablet Take 325 mg by mouth daily. Reported on 07/30/2015    . atorvastatin (LIPITOR) 20 MG tablet TAKE 1 TABLET BY MOUTH EVERY EVENING TO CONTROL CHOLESTEROL (Patient not taking: Reported on 07/30/2015) 90 tablet 1  . Calcium Carbonate-Vitamin D (CALCIUM 600+D) 600-400 MG-UNIT tablet Take 1 tablet by mouth daily. (Patient not taking: Reported on 07/30/2015) 30 tablet 5  . polyethylene glycol (MIRALAX / GLYCOLAX) packet Take 17 g by mouth 2 (two) times daily. Reported on 07/30/2015    . QUEtiapine (SEROQUEL) 25 MG tablet One tab po every night for one week, then 2 tabs po every night (Patient not taking: Reported on 07/30/2015) 60 tablet 6  . traMADol (ULTRAM) 50 MG tablet Take 1-2 tablets (50-100 mg total) by mouth every 6 (six) hours as needed for moderate pain. (Patient not taking: Reported on 07/30/2015) 60 tablet 0   No facility-administered medications prior to visit.    PAST MEDICAL HISTORY: Past Medical History  Diagnosis Date  . Hypertension   . Hyperlipidemia   . Anxiety   . Dementia   . Anemia   . CHF (congestive heart failure) (HCC)     PAST SURGICAL HISTORY: Past Surgical History  Procedure Laterality Date  . Lumbar laminectomy  12/04/2012    Complete decompressive L3-4 and L4-5--Dr. Darrelyn Hillock  . Abdominal hysterectomy    . Cholecystectomy    . Cardiovascular stress test  11/16/2009    Perfusion defect seen in inferior myocardial region consistent with diaphragmatic attenuation. Remaining myocardium  demonstrates normalmyocardial perfusion with no evidence of ischemia or infarct. No ECG changes. EKG negative for ischemia.  . Transthoracic echocardiogram  09/01/2011    EF >55% and moderate tricuspid valve regurg.  . Bilateral oophorectomy    . Total knee arthroplasty Right 02/11/2015    Procedure: TOTAL KNEE ARTHROPLASTY;  Surgeon: Ranee Gosselin, MD;  Location: [facility];  Service: Orthopedics;  Laterality: Right;  . I&d knee with poly exchange Right 04/21/2015    Procedure: IRRIGATION AND DEBRIDEMENT RIGHT KNEE WITH PATELLECTOMY AND REPAIR OF COMPLEX OPEN WOUND;  Surgeon: Ranee Gosselin, MD;  Location: [facility];  Service: Orthopedics;  Laterality: Right;  . I&d right knee with patellectomy  04/21/2015    FAMILY HISTORY: Family History  Problem Relation Age of Onset  . Cancer Sister   . Stroke Mother   . Stroke Father     SOCIAL HISTORY: Social History   Social History  . Marital Status: Widowed    Spouse Name: N/A  . Number of Children: 3  . Years of Education: 12th   Occupational History  . retired    Social History Main Topics  . Smoking status: Never Smoker   . Smokeless tobacco: Never Used  . Alcohol  Use: No  . Drug Use: No  . Sexual Activity: No   Other Topics Concern  . Not on file   Social History Narrative      PHYSICAL EXAM  Filed Vitals:   07/30/15 1347  BP: 121/68  Pulse: 74  Height:  (1.575 m)   There is no weight on file to calculate BMI.   MMSE - Mini Mental State Exam 07/30/2015 05/01/2015  Orientation to time 1 3  Orientation to Place 2 2  Registration 3 1  Attention/ Calculation 2 3  Recall 0 0  Language- name 2 objects 1 2  Language- repeat 0 1  Language- follow 3 step command 3 3  Language- read & follow direction 1 1  Write a sentence 1 1  Copy design 0 1  Total score 14 18     Generalized: Well developed, in no acute distress   Neurological examination  Mentation: Alert oriented to time, place, history taking. Follows all  commands speech and language fluent Cranial nerve II-XII: Pupils were equal round reactive to light. Extraocular movements were full, visual field were full on confrontational test. Facial sensation and strength were normal. Uvula tongue midline. Head turning and shoulder shrug  were normal and symmetric. Motor: The motor testing reveals good strength. The patient has limited range of motion of the right leg due to a compound fracture.Peri Jefferson symmetric motor tone is noted throughout.  Sensory: Sensory testing is intact to soft touch on all 4 extremities. No evidence of extinction is noted.  Coordination: Cerebellar testing reveals good finger-nose-finger bilaterally  Gait and station: Patient is in a wheelchair. Reflexes: Deep tendon reflexes are symmetric and normal bilaterally.   DIAGNOSTIC DATA (LABS, IMAGING, TESTING) - I reviewed patient records, labs, notes, testing and imaging myself where available.  Lab Results  Component Value Date   WBC 6.5 05/09/2015   HGB 8.5* 05/09/2015   HCT 27.3* 05/09/2015   MCV 90.7 05/09/2015   PLT 384 05/09/2015      Component Value Date/Time   NA 145 05/09/2015 0100   NA 143 02/17/2015   K 3.1* 05/09/2015 0100   CL 112* 05/09/2015 0100   CO2 24 05/09/2015 0100   GLUCOSE 96 05/09/2015 0100   GLUCOSE 90 02/21/2014 1046   BUN 15 05/09/2015 0100   BUN 32* 02/17/2015   CREATININE 0.69 05/09/2015 0100   CREATININE 0.9 02/17/2015   CALCIUM 8.6* 05/09/2015 0100   PROT 5.3* 05/09/2015 0100   PROT 6.7 02/21/2014 1046   ALBUMIN 2.5* 05/09/2015 0100   ALBUMIN 4.4 02/21/2014 1046   AST 15 05/09/2015 0100   ALT 10* 05/09/2015 0100   ALKPHOS 86 05/09/2015 0100   BILITOT 0.5 05/09/2015 0100   GFRNONAA >60 05/09/2015 0100   GFRAA >60 05/09/2015 0100   Lab Results  Component Value Date   CHOL 179 02/21/2014   HDL 46 02/21/2014   LDLCALC 89 02/21/2014   TRIG 219* 02/21/2014   CHOLHDL 3.9 02/21/2014    Lab Results  Component Value Date    VITAMINB12 495 04/23/2015   Lab Results  Component Value Date   TSH 2.302 04/23/2015      ASSESSMENT AND PLAN 82 y.o. year old female  has a past medical history of Hypertension; Hyperlipidemia; Anxiety; Dementia; Anemia; and CHF (congestive heart failure) (HCC). here with:  1. Memory disturbance 2. History of stroke  Patient's memory score has declined. Her daughter is asking about potential medication. We will try the patient on  Namenda. She will start with the titration pack and if tolerating will continue with a maintenance dose. I have reviewed the side effects with the patient and her daughter. Patient has continued having hallucinations however they are not fearful or violent. We will not start any additional medication for this at this time. Patient advised that if her symptoms worsen or she develops any new symptoms she should let us know. She will follow-up in 6 months with Dr. Terrace Arabia.     Butch Penny, MSN, NP-C 07/30/2015, 2:59 PM [facility] [address] [place], [place] [address] [phone]

## 2015-08-03 NOTE — Progress Notes (Signed)
I have reviewed and agreed above plan. 

## 2015-08-04 DIAGNOSIS — L089 Local infection of the skin and subcutaneous tissue, unspecified: Secondary | ICD-10-CM | POA: Diagnosis not present

## 2015-08-04 DIAGNOSIS — R609 Edema, unspecified: Secondary | ICD-10-CM | POA: Diagnosis not present

## 2015-08-11 DIAGNOSIS — Z96651 Presence of right artificial knee joint: Secondary | ICD-10-CM | POA: Diagnosis not present

## 2015-08-11 DIAGNOSIS — T8131XD Disruption of external operation (surgical) wound, not elsewhere classified, subsequent encounter: Secondary | ICD-10-CM | POA: Diagnosis not present

## 2015-08-12 ENCOUNTER — Ambulatory Visit: Payer: Medicare Other | Admitting: Neurology

## 2015-08-17 DIAGNOSIS — R2681 Unsteadiness on feet: Secondary | ICD-10-CM | POA: Diagnosis not present

## 2015-08-17 DIAGNOSIS — M6281 Muscle weakness (generalized): Secondary | ICD-10-CM | POA: Diagnosis not present

## 2015-08-17 DIAGNOSIS — T8131XD Disruption of external operation (surgical) wound, not elsewhere classified, subsequent encounter: Secondary | ICD-10-CM | POA: Diagnosis not present

## 2015-08-17 DIAGNOSIS — R278 Other lack of coordination: Secondary | ICD-10-CM | POA: Diagnosis not present

## 2015-08-18 DIAGNOSIS — M6281 Muscle weakness (generalized): Secondary | ICD-10-CM | POA: Diagnosis not present

## 2015-08-18 DIAGNOSIS — R278 Other lack of coordination: Secondary | ICD-10-CM | POA: Diagnosis not present

## 2015-08-18 DIAGNOSIS — R2681 Unsteadiness on feet: Secondary | ICD-10-CM | POA: Diagnosis not present

## 2015-08-18 DIAGNOSIS — Z7901 Long term (current) use of anticoagulants: Secondary | ICD-10-CM | POA: Diagnosis not present

## 2015-08-18 DIAGNOSIS — T8131XD Disruption of external operation (surgical) wound, not elsewhere classified, subsequent encounter: Secondary | ICD-10-CM | POA: Diagnosis not present

## 2015-08-18 DIAGNOSIS — I4891 Unspecified atrial fibrillation: Secondary | ICD-10-CM | POA: Diagnosis not present

## 2015-08-19 DIAGNOSIS — Z7901 Long term (current) use of anticoagulants: Secondary | ICD-10-CM | POA: Diagnosis not present

## 2015-08-19 DIAGNOSIS — R2681 Unsteadiness on feet: Secondary | ICD-10-CM | POA: Diagnosis not present

## 2015-08-19 DIAGNOSIS — I2699 Other pulmonary embolism without acute cor pulmonale: Secondary | ICD-10-CM | POA: Diagnosis not present

## 2015-08-19 DIAGNOSIS — M6281 Muscle weakness (generalized): Secondary | ICD-10-CM | POA: Diagnosis not present

## 2015-08-19 DIAGNOSIS — Z5181 Encounter for therapeutic drug level monitoring: Secondary | ICD-10-CM | POA: Diagnosis not present

## 2015-08-19 DIAGNOSIS — T8131XD Disruption of external operation (surgical) wound, not elsewhere classified, subsequent encounter: Secondary | ICD-10-CM | POA: Diagnosis not present

## 2015-08-19 DIAGNOSIS — R278 Other lack of coordination: Secondary | ICD-10-CM | POA: Diagnosis not present

## 2015-08-20 DIAGNOSIS — M6281 Muscle weakness (generalized): Secondary | ICD-10-CM | POA: Diagnosis not present

## 2015-08-20 DIAGNOSIS — T8131XD Disruption of external operation (surgical) wound, not elsewhere classified, subsequent encounter: Secondary | ICD-10-CM | POA: Diagnosis not present

## 2015-08-20 DIAGNOSIS — R2681 Unsteadiness on feet: Secondary | ICD-10-CM | POA: Diagnosis not present

## 2015-08-20 DIAGNOSIS — R278 Other lack of coordination: Secondary | ICD-10-CM | POA: Diagnosis not present

## 2015-08-21 DIAGNOSIS — T8131XD Disruption of external operation (surgical) wound, not elsewhere classified, subsequent encounter: Secondary | ICD-10-CM | POA: Diagnosis not present

## 2015-08-21 DIAGNOSIS — M6281 Muscle weakness (generalized): Secondary | ICD-10-CM | POA: Diagnosis not present

## 2015-08-21 DIAGNOSIS — R2681 Unsteadiness on feet: Secondary | ICD-10-CM | POA: Diagnosis not present

## 2015-08-21 DIAGNOSIS — R278 Other lack of coordination: Secondary | ICD-10-CM | POA: Diagnosis not present

## 2015-08-24 DIAGNOSIS — M6281 Muscle weakness (generalized): Secondary | ICD-10-CM | POA: Diagnosis not present

## 2015-08-24 DIAGNOSIS — R2681 Unsteadiness on feet: Secondary | ICD-10-CM | POA: Diagnosis not present

## 2015-08-24 DIAGNOSIS — T8131XD Disruption of external operation (surgical) wound, not elsewhere classified, subsequent encounter: Secondary | ICD-10-CM | POA: Diagnosis not present

## 2015-08-24 DIAGNOSIS — R278 Other lack of coordination: Secondary | ICD-10-CM | POA: Diagnosis not present

## 2015-08-25 DIAGNOSIS — I4891 Unspecified atrial fibrillation: Secondary | ICD-10-CM | POA: Diagnosis not present

## 2015-08-25 DIAGNOSIS — Z7901 Long term (current) use of anticoagulants: Secondary | ICD-10-CM | POA: Diagnosis not present

## 2015-08-26 DIAGNOSIS — R278 Other lack of coordination: Secondary | ICD-10-CM | POA: Diagnosis not present

## 2015-08-26 DIAGNOSIS — T8131XD Disruption of external operation (surgical) wound, not elsewhere classified, subsequent encounter: Secondary | ICD-10-CM | POA: Diagnosis not present

## 2015-08-26 DIAGNOSIS — M6281 Muscle weakness (generalized): Secondary | ICD-10-CM | POA: Diagnosis not present

## 2015-08-26 DIAGNOSIS — R2681 Unsteadiness on feet: Secondary | ICD-10-CM | POA: Diagnosis not present

## 2015-09-01 DIAGNOSIS — Z7901 Long term (current) use of anticoagulants: Secondary | ICD-10-CM | POA: Diagnosis not present

## 2015-09-01 DIAGNOSIS — I4891 Unspecified atrial fibrillation: Secondary | ICD-10-CM | POA: Diagnosis not present

## 2015-09-01 DIAGNOSIS — R062 Wheezing: Secondary | ICD-10-CM | POA: Diagnosis not present

## 2015-09-02 DIAGNOSIS — N390 Urinary tract infection, site not specified: Secondary | ICD-10-CM | POA: Diagnosis not present

## 2015-09-02 DIAGNOSIS — R319 Hematuria, unspecified: Secondary | ICD-10-CM | POA: Diagnosis not present

## 2015-09-04 DIAGNOSIS — F411 Generalized anxiety disorder: Secondary | ICD-10-CM | POA: Diagnosis not present

## 2015-09-06 DIAGNOSIS — F411 Generalized anxiety disorder: Secondary | ICD-10-CM | POA: Diagnosis not present

## 2015-09-10 DIAGNOSIS — Z4789 Encounter for other orthopedic aftercare: Secondary | ICD-10-CM | POA: Diagnosis not present

## 2015-09-10 DIAGNOSIS — T8131XD Disruption of external operation (surgical) wound, not elsewhere classified, subsequent encounter: Secondary | ICD-10-CM | POA: Diagnosis not present

## 2015-09-17 DIAGNOSIS — R0602 Shortness of breath: Secondary | ICD-10-CM | POA: Diagnosis not present

## 2015-09-17 DIAGNOSIS — Z471 Aftercare following joint replacement surgery: Secondary | ICD-10-CM | POA: Diagnosis not present

## 2015-09-17 DIAGNOSIS — M4800 Spinal stenosis, site unspecified: Secondary | ICD-10-CM | POA: Diagnosis not present

## 2015-09-17 DIAGNOSIS — I5032 Chronic diastolic (congestive) heart failure: Secondary | ICD-10-CM | POA: Diagnosis not present

## 2015-09-17 DIAGNOSIS — M5440 Lumbago with sciatica, unspecified side: Secondary | ICD-10-CM | POA: Diagnosis not present

## 2015-09-17 DIAGNOSIS — M6281 Muscle weakness (generalized): Secondary | ICD-10-CM | POA: Diagnosis not present

## 2015-09-17 DIAGNOSIS — R609 Edema, unspecified: Secondary | ICD-10-CM | POA: Diagnosis not present

## 2015-09-17 DIAGNOSIS — E559 Vitamin D deficiency, unspecified: Secondary | ICD-10-CM | POA: Diagnosis not present

## 2015-09-17 DIAGNOSIS — R319 Hematuria, unspecified: Secondary | ICD-10-CM | POA: Diagnosis not present

## 2015-09-17 DIAGNOSIS — R278 Other lack of coordination: Secondary | ICD-10-CM | POA: Diagnosis not present

## 2015-09-17 DIAGNOSIS — M810 Age-related osteoporosis without current pathological fracture: Secondary | ICD-10-CM | POA: Diagnosis not present

## 2015-09-17 DIAGNOSIS — M179 Osteoarthritis of knee, unspecified: Secondary | ICD-10-CM | POA: Diagnosis not present

## 2015-09-17 DIAGNOSIS — Z79899 Other long term (current) drug therapy: Secondary | ICD-10-CM | POA: Diagnosis not present

## 2015-09-17 DIAGNOSIS — F0391 Unspecified dementia with behavioral disturbance: Secondary | ICD-10-CM | POA: Diagnosis not present

## 2015-09-17 DIAGNOSIS — M5430 Sciatica, unspecified side: Secondary | ICD-10-CM | POA: Diagnosis not present

## 2015-09-17 DIAGNOSIS — F418 Other specified anxiety disorders: Secondary | ICD-10-CM | POA: Diagnosis not present

## 2015-09-17 DIAGNOSIS — R05 Cough: Secondary | ICD-10-CM | POA: Diagnosis not present

## 2015-09-17 DIAGNOSIS — K5730 Diverticulosis of large intestine without perforation or abscess without bleeding: Secondary | ICD-10-CM | POA: Diagnosis not present

## 2015-09-17 DIAGNOSIS — Z86718 Personal history of other venous thrombosis and embolism: Secondary | ICD-10-CM | POA: Diagnosis not present

## 2015-09-17 DIAGNOSIS — Z9181 History of falling: Secondary | ICD-10-CM | POA: Diagnosis not present

## 2015-09-17 DIAGNOSIS — Z66 Do not resuscitate: Secondary | ICD-10-CM | POA: Diagnosis not present

## 2015-09-17 DIAGNOSIS — M25461 Effusion, right knee: Secondary | ICD-10-CM | POA: Diagnosis not present

## 2015-09-17 DIAGNOSIS — K219 Gastro-esophageal reflux disease without esophagitis: Secondary | ICD-10-CM | POA: Diagnosis not present

## 2015-09-17 DIAGNOSIS — I2699 Other pulmonary embolism without acute cor pulmonale: Secondary | ICD-10-CM | POA: Diagnosis not present

## 2015-09-17 DIAGNOSIS — F419 Anxiety disorder, unspecified: Secondary | ICD-10-CM | POA: Diagnosis not present

## 2015-09-17 DIAGNOSIS — F0280 Dementia in other diseases classified elsewhere without behavioral disturbance: Secondary | ICD-10-CM | POA: Diagnosis not present

## 2015-09-17 DIAGNOSIS — T8131XD Disruption of external operation (surgical) wound, not elsewhere classified, subsequent encounter: Secondary | ICD-10-CM | POA: Diagnosis not present

## 2015-09-17 DIAGNOSIS — Z96651 Presence of right artificial knee joint: Secondary | ICD-10-CM | POA: Diagnosis not present

## 2015-09-17 DIAGNOSIS — Z7901 Long term (current) use of anticoagulants: Secondary | ICD-10-CM | POA: Diagnosis not present

## 2015-09-17 DIAGNOSIS — H409 Unspecified glaucoma: Secondary | ICD-10-CM | POA: Diagnosis not present

## 2015-09-17 DIAGNOSIS — L299 Pruritus, unspecified: Secondary | ICD-10-CM | POA: Diagnosis not present

## 2015-09-17 DIAGNOSIS — J1000 Influenza due to other identified influenza virus with unspecified type of pneumonia: Secondary | ICD-10-CM | POA: Diagnosis not present

## 2015-09-17 DIAGNOSIS — I10 Essential (primary) hypertension: Secondary | ICD-10-CM | POA: Diagnosis not present

## 2015-09-17 DIAGNOSIS — H353131 Nonexudative age-related macular degeneration, bilateral, early dry stage: Secondary | ICD-10-CM | POA: Diagnosis not present

## 2015-09-17 DIAGNOSIS — R41841 Cognitive communication deficit: Secondary | ICD-10-CM | POA: Diagnosis not present

## 2015-09-17 DIAGNOSIS — J329 Chronic sinusitis, unspecified: Secondary | ICD-10-CM | POA: Diagnosis not present

## 2015-09-17 DIAGNOSIS — L03119 Cellulitis of unspecified part of limb: Secondary | ICD-10-CM | POA: Diagnosis not present

## 2015-09-17 DIAGNOSIS — J449 Chronic obstructive pulmonary disease, unspecified: Secondary | ICD-10-CM | POA: Diagnosis not present

## 2015-09-17 DIAGNOSIS — F411 Generalized anxiety disorder: Secondary | ICD-10-CM | POA: Diagnosis not present

## 2015-09-17 DIAGNOSIS — F0390 Unspecified dementia without behavioral disturbance: Secondary | ICD-10-CM | POA: Diagnosis not present

## 2015-09-17 DIAGNOSIS — J410 Simple chronic bronchitis: Secondary | ICD-10-CM | POA: Diagnosis not present

## 2015-09-17 DIAGNOSIS — F329 Major depressive disorder, single episode, unspecified: Secondary | ICD-10-CM | POA: Diagnosis not present

## 2015-09-17 DIAGNOSIS — N390 Urinary tract infection, site not specified: Secondary | ICD-10-CM | POA: Diagnosis not present

## 2015-09-17 DIAGNOSIS — Z8673 Personal history of transient ischemic attack (TIA), and cerebral infarction without residual deficits: Secondary | ICD-10-CM | POA: Diagnosis not present

## 2015-09-17 DIAGNOSIS — G309 Alzheimer's disease, unspecified: Secondary | ICD-10-CM | POA: Diagnosis not present

## 2015-09-17 DIAGNOSIS — J309 Allergic rhinitis, unspecified: Secondary | ICD-10-CM | POA: Diagnosis not present

## 2015-09-17 DIAGNOSIS — M25561 Pain in right knee: Secondary | ICD-10-CM | POA: Diagnosis not present

## 2015-09-17 DIAGNOSIS — I4891 Unspecified atrial fibrillation: Secondary | ICD-10-CM | POA: Diagnosis not present

## 2015-09-17 DIAGNOSIS — R29898 Other symptoms and signs involving the musculoskeletal system: Secondary | ICD-10-CM | POA: Diagnosis not present

## 2015-09-17 DIAGNOSIS — K648 Other hemorrhoids: Secondary | ICD-10-CM | POA: Diagnosis not present

## 2015-09-17 DIAGNOSIS — K5900 Constipation, unspecified: Secondary | ICD-10-CM | POA: Diagnosis not present

## 2015-09-17 DIAGNOSIS — R2681 Unsteadiness on feet: Secondary | ICD-10-CM | POA: Diagnosis not present

## 2015-09-17 DIAGNOSIS — S76111D Strain of right quadriceps muscle, fascia and tendon, subsequent encounter: Secondary | ICD-10-CM | POA: Diagnosis not present

## 2015-09-17 DIAGNOSIS — R062 Wheezing: Secondary | ICD-10-CM | POA: Diagnosis not present

## 2015-09-17 DIAGNOSIS — R262 Difficulty in walking, not elsewhere classified: Secondary | ICD-10-CM | POA: Diagnosis not present

## 2015-09-17 DIAGNOSIS — E785 Hyperlipidemia, unspecified: Secondary | ICD-10-CM | POA: Diagnosis not present

## 2015-09-17 DIAGNOSIS — M1990 Unspecified osteoarthritis, unspecified site: Secondary | ICD-10-CM | POA: Diagnosis not present

## 2015-09-21 DIAGNOSIS — Z8673 Personal history of transient ischemic attack (TIA), and cerebral infarction without residual deficits: Secondary | ICD-10-CM | POA: Diagnosis not present

## 2015-09-21 DIAGNOSIS — J1000 Influenza due to other identified influenza virus with unspecified type of pneumonia: Secondary | ICD-10-CM | POA: Diagnosis not present

## 2015-09-21 DIAGNOSIS — K219 Gastro-esophageal reflux disease without esophagitis: Secondary | ICD-10-CM | POA: Diagnosis not present

## 2015-10-22 DIAGNOSIS — Z96651 Presence of right artificial knee joint: Secondary | ICD-10-CM | POA: Diagnosis not present

## 2015-10-22 DIAGNOSIS — T8131XD Disruption of external operation (surgical) wound, not elsewhere classified, subsequent encounter: Secondary | ICD-10-CM | POA: Diagnosis not present

## 2015-11-16 DIAGNOSIS — L03119 Cellulitis of unspecified part of limb: Secondary | ICD-10-CM | POA: Diagnosis not present

## 2015-11-16 DIAGNOSIS — M25461 Effusion, right knee: Secondary | ICD-10-CM | POA: Diagnosis not present

## 2015-11-16 DIAGNOSIS — M25561 Pain in right knee: Secondary | ICD-10-CM | POA: Diagnosis not present

## 2015-11-18 DIAGNOSIS — L03119 Cellulitis of unspecified part of limb: Secondary | ICD-10-CM | POA: Diagnosis not present

## 2015-11-18 DIAGNOSIS — M25461 Effusion, right knee: Secondary | ICD-10-CM | POA: Diagnosis not present

## 2015-11-18 DIAGNOSIS — J449 Chronic obstructive pulmonary disease, unspecified: Secondary | ICD-10-CM | POA: Diagnosis not present

## 2015-11-18 DIAGNOSIS — R062 Wheezing: Secondary | ICD-10-CM | POA: Diagnosis not present

## 2015-12-06 DIAGNOSIS — L299 Pruritus, unspecified: Secondary | ICD-10-CM | POA: Diagnosis not present

## 2015-12-06 DIAGNOSIS — R609 Edema, unspecified: Secondary | ICD-10-CM | POA: Diagnosis not present

## 2015-12-06 DIAGNOSIS — M179 Osteoarthritis of knee, unspecified: Secondary | ICD-10-CM | POA: Diagnosis not present

## 2015-12-14 DIAGNOSIS — M4800 Spinal stenosis, site unspecified: Secondary | ICD-10-CM | POA: Diagnosis not present

## 2015-12-14 DIAGNOSIS — J449 Chronic obstructive pulmonary disease, unspecified: Secondary | ICD-10-CM | POA: Diagnosis not present

## 2015-12-14 DIAGNOSIS — M5440 Lumbago with sciatica, unspecified side: Secondary | ICD-10-CM | POA: Diagnosis not present

## 2015-12-14 DIAGNOSIS — F0391 Unspecified dementia with behavioral disturbance: Secondary | ICD-10-CM | POA: Diagnosis not present

## 2016-01-01 DIAGNOSIS — R05 Cough: Secondary | ICD-10-CM | POA: Diagnosis not present

## 2016-01-01 DIAGNOSIS — J449 Chronic obstructive pulmonary disease, unspecified: Secondary | ICD-10-CM | POA: Diagnosis not present

## 2016-01-01 DIAGNOSIS — J410 Simple chronic bronchitis: Secondary | ICD-10-CM | POA: Diagnosis not present

## 2016-01-11 DIAGNOSIS — J449 Chronic obstructive pulmonary disease, unspecified: Secondary | ICD-10-CM | POA: Diagnosis not present

## 2016-01-11 DIAGNOSIS — R05 Cough: Secondary | ICD-10-CM | POA: Diagnosis not present

## 2016-01-28 ENCOUNTER — Encounter: Payer: Self-pay | Admitting: Neurology

## 2016-01-28 ENCOUNTER — Ambulatory Visit (INDEPENDENT_AMBULATORY_CARE_PROVIDER_SITE_OTHER): Payer: Medicare Other | Admitting: Neurology

## 2016-01-28 ENCOUNTER — Ambulatory Visit: Payer: Medicare Other | Admitting: Neurology

## 2016-01-28 VITALS — BP 136/78 | HR 80 | Ht 62.0 in

## 2016-01-28 DIAGNOSIS — F418 Other specified anxiety disorders: Secondary | ICD-10-CM | POA: Diagnosis not present

## 2016-01-28 DIAGNOSIS — F0280 Dementia in other diseases classified elsewhere without behavioral disturbance: Secondary | ICD-10-CM | POA: Diagnosis not present

## 2016-01-28 DIAGNOSIS — G309 Alzheimer's disease, unspecified: Secondary | ICD-10-CM | POA: Diagnosis not present

## 2016-01-28 DIAGNOSIS — Z66 Do not resuscitate: Secondary | ICD-10-CM | POA: Diagnosis not present

## 2016-01-28 DIAGNOSIS — F419 Anxiety disorder, unspecified: Principal | ICD-10-CM

## 2016-01-28 DIAGNOSIS — F329 Major depressive disorder, single episode, unspecified: Secondary | ICD-10-CM

## 2016-01-28 NOTE — Progress Notes (Signed)
PATIENT: Kristin Wilkerson DOB: 12-Jun-1925  REASON FOR VISIT: follow up- memory disturbance, stroke HISTORY FROM: patient  HISTORY OF PRESENT ILLNES:   HISTORY 05/01/15 (MM): Kristin Wilkerson is 82 years old right-handed female, accompanied by her daughter, and son at today's clinical visit, she was a patient of Dr. Pearlean Brownie for stroke, last clinical visit was in April 2016  She had a history of hypertension, hyperlipidemia, suffered a right occipital stroke in November 2014, patient was on aspirin 81 mg daily when the stroke happened, I have personally reviewed MRI of brain in November 2014, right occipital acute DWI relation, moderate generalized atrophy, MRA of the brain showed some irregularity at intra-cranial vessels, related intracranial atherosclerotic disease, no large vessel disease. Echocardiogram showed ejection fraction 55-60%, no cardioembolic source, ultrasound of carotid artery showed 1-39% bilateral internal carotid artery stenosis. She was discharged on Plavix,  She used to live at home, has mild baseline memory trouble, but still highly functional, she underwent right knee replacement in February 11 2015, was discharged to [facility] rehabilitation, she fell multiple times, injured her right knee, has to second right knee surgery in January fifth 2017, she is now at different facility, [facility] [place], she is no longer ambulatory, wheelchair bound.   During the process, she was found to have increased confusion, anxiety, develop visual hallucinations, she saw her sister who passed away 5 years ago, double was sending text message to her, she has been taking Cymbalta to 30 mg daily for many years, she seems to respond to low-dose Xanax for a while, her hospital course was also complicated by UTI, pneumonia.  Her sons are in the process of finalizing her power of attorney paperwork, she has DO NOT RESUSCITATE order on file, she has good appetite, increased agitation confusion  and hallucination at evening time.  UPDATE Jan 28 2016; [facility],  I reviewed her laboratory evaluation in July 2017, mild anemia with hemoglobin of 10 point 9, low MCH 20 4.9, RDW 15 point 5, INR was elevated 2.7,  she is on chronic Coumadin treatment,  I also reviewed her medication list, she is DO NOT RESUSCITATE, taking Cymbalta 60 mg, Lasix 20 mg, losartan 50 mg, prostat 30 cc, Lopressor 100 mg twice a day, Namenda in 10 mg twice a day, vitamin D supplement mirtazapine 15 mg at bedtime Xanax as needed, as needed, Tylenol 500mg  2 tab three times a day.  She participates in activities occasionally, spend most of time watching TV. It is hard for her to read now.  REVIEW OF SYSTEMS: Out of a complete 14 system review of symptoms, the patient complains only of the following symptoms, and all other reviewed systems are negative. Right leg swelling, right knee pain   ALLERGIES: No Known Allergies  HOME MEDICATIONS: Outpatient Medications Prior to Visit  Medication Sig Dispense Refill  . acetaminophen (TYLENOL) 500 MG tablet Take 1,000 mg by mouth 3 (three) times daily.    [name] ALPRAZolam (XANAX) 0.25 MG tablet Take 0.25 mg by mouth every 4 (four) hours as needed for anxiety.    . Amino Acids-Protein Hydrolys (FEEDING SUPPLEMENT, PRO-STAT SUGAR FREE 64,) LIQD Take 30 mLs by mouth daily.    . cholecalciferol (VITAMIN D) 1000 UNITS tablet Take 1,000 Units by mouth daily at 8 pm.     . doxycycline (VIBRA-TABS) 100 MG tablet Take 100 mg by mouth daily.    . DULoxetine (CYMBALTA) 60 MG capsule Take  1 capsule (60 mg total) by mouth daily. One daily to help nerves 30 capsule 6  . furosemide (LASIX) 20 MG tablet Take 1 tablet (20 mg total) by mouth every other day. 30 tablet 5  . hydrocortisone (ANUSOL-HC) 2.5 % rectal cream Place 1 application rectally 2 (two) times daily as needed for hemorrhoids or itching.    . latanoprost (XALATAN) 0.005 % ophthalmic  solution PLACE 1 DROP IN BOTH EYES AT BEDTIME TO TREAT GLAUCOMA 7.5 mL 2  . losartan (COZAAR) 100 MG tablet TAKE 1 TABLET BY MOUTH ONCE DAILY FOR BLOOD PRESSURE (Patient taking differently: TAKE 1/2 TABLET BY MOUTH ONCE DAILY FOR BLOOD PRESSURE) 90 tablet 1  . memantine (NAMENDA TITRATION PAK) tablet pack 5 mg/day for =1 week; 5 mg twice daily for =1 week; 15 mg/day given in 5 mg and 10 mg separated doses for =1 week; then 10 mg twice daily 49 tablet 0  . metoprolol (LOPRESSOR) 100 MG tablet TAKE 1 TABLET BY MOUTH TWICE DAILY TO CONTORL BLOOD PRESSURE 180 tablet 1  . mirtazapine (REMERON) 15 MG tablet Take 15 mg by mouth at bedtime.    . Nutritional Supplements (ARGINAID PO) Take by mouth 2 (two) times daily.    . sennosides-docusate sodium (SENOKOT-S) 8.6-50 MG tablet Take 2 tablets by mouth 2 (two) times daily.    [name] warfarin (COUMADIN) 1 MG tablet Take 1 mg by mouth daily.     No facility-administered medications prior to visit.     PAST MEDICAL HISTORY: Past Medical History:  Diagnosis Date  . Anemia   . Anxiety   . CHF (congestive heart failure) (HCC)   . Dementia   . Hyperlipidemia   . Hypertension     PAST SURGICAL HISTORY: Past Surgical History:  Procedure Laterality Date  . ABDOMINAL HYSTERECTOMY    . BILATERAL OOPHORECTOMY    . CARDIOVASCULAR STRESS TEST  11/16/2009   Perfusion defect seen in inferior myocardial region consistent with diaphragmatic attenuation. Remaining myocardium demonstrates normalmyocardial perfusion with no evidence of ischemia or infarct. No ECG changes. EKG negative for ischemia.  . CHOLECYSTECTOMY    . I&D KNEE WITH POLY EXCHANGE Right 04/21/2015   Procedure: IRRIGATION AND DEBRIDEMENT RIGHT KNEE WITH PATELLECTOMY AND REPAIR OF COMPLEX OPEN WOUND;  Surgeon: Ranee Gosselin, MD;  Location: [facility];  Service: Orthopedics;  Laterality: Right;  . I&D RIGHT KNEE WITH PATELLECTOMY  04/21/2015  . LUMBAR LAMINECTOMY  12/04/2012   Complete decompressive L3-4 and  L4-5--Dr. Darrelyn Hillock  . TOTAL KNEE ARTHROPLASTY Right 02/11/2015   Procedure: TOTAL KNEE ARTHROPLASTY;  Surgeon: Ranee Gosselin, MD;  Location: [facility];  Service: Orthopedics;  Laterality: Right;  . TRANSTHORACIC ECHOCARDIOGRAM  09/01/2011   EF >55% and moderate tricuspid valve regurg.    FAMILY HISTORY: Family History  Problem Relation Age of Onset  . Cancer Sister   . Stroke Mother   . Stroke Father     SOCIAL HISTORY: Social History   Social History  . Marital status: Widowed    Spouse name: N/A  . Number of children: 3  . Years of education: 12th   Occupational History  . retired    Social History Main Topics  . Smoking status: Never Smoker  . Smokeless tobacco: Never Used  . Alcohol use No  . Drug use: No  . Sexual activity: No   Other Topics Concern  . Not on file   Social History Narrative  . No narrative on file      PHYSICAL  EXAM  Vitals:   01/28/16 1425  BP: 136/78  Pulse: 80  Height: 5\' 2"  (1.575 m)   There is no height or weight on file to calculate BMI.   MMSE - Mini Mental State Exam 01/28/2016 07/30/2015 05/01/2015  Orientation to time 3 1 3   Orientation to Place 2 2 2   Registration 3 3 1   Attention/ Calculation 5 2 3   Recall 2 0 0  Language- name 2 objects 2 1 2   Language- repeat 0 0 1  Language- follow 3 step command 3 3 3   Language- read & follow direction 1 1 1   Write a sentence 1 1 1   Copy design 1 0 1  Total score 23 14 18     PHYSICAL EXAMNIATION:  Gen: NAD, conversant, well nourised, obese, well groomed                     Cardiovascular: Regular rate rhythm, no peripheral edema, warm, nontender. Eyes: Conjunctivae clear without exudates or hemorrhage Neck: Supple, no carotid bruise. Pulmonary: Clear to auscultation bilaterally   NEUROLOGICAL EXAM:  MENTAL STATUS: Speech:    Speech is normal; fluent and spontaneous with normal comprehension.  Cognition: MMSE 24/30,     Orientation: She is not oriented to date, Dr., city,  county      recent and remote memory: She missed one out of 3 recalls     Normal Attention span and concentration     Normal Language, naming, spontaneous speech, she has difficulty repeating     Fund of knowledge   CRANIAL NERVES: CN II: Visual fields are full to confrontation. Fundoscopic exam is normal with sharp discs and no vascular changes. Pupils are round equal and briskly reactive to light. CN III, IV, VI: extraocular movement are normal. No ptosis. CN V: Facial sensation is intact to pinprick in all 3 divisions bilaterally. Corneal responses are intact.  CN VII: Face is symmetric with normal eye closure and smile. CN VIII: Hearing is normal to rubbing fingers CN IX, X: Palate elevates symmetrically. Phonation is normal. CN XI: Head turning and shoulder shrug are intact CN XII: Tongue is midline with normal movements and no atrophy.  MOTOR: Bilateral upper extremity motor strength is normal Right lower extremity examination is limited because of the right knee pain, right knee brace, with right foot swelling  REFLEXES: Reflexes are hypoactive and symmetric. Plantar responses are flexor.  SENSORY: Intact to light touch, pinprick,  COORDINATION: Rapid alternating movements and fine finger movements are intact. There is no dysmetria on finger-to-nose and heel-knee-shin.    GAIT/STANCE: Deferred    DIAGNOSTIC DATA (LABS, IMAGING, TESTING) - I reviewed patient records, labs, notes, testing and imaging myself where available.  Lab Results  Component Value Date   WBC 6.5 05/09/2015   HGB 8.5 (L) 05/09/2015   HCT 27.3 (L) 05/09/2015   MCV 90.7 05/09/2015   PLT 384 05/09/2015      Component Value Date/Time   NA 145 05/09/2015 0100   NA 143 02/17/2015   K 3.1 (L) 05/09/2015 0100   CL 112 (H) 05/09/2015 0100   CO2 24 05/09/2015 0100   GLUCOSE 96 05/09/2015 0100   BUN 15 05/09/2015 0100   BUN 32 (A) 02/17/2015   CREATININE 0.69 05/09/2015 0100   CALCIUM 8.6 (L)  05/09/2015 0100   PROT 5.3 (L) 05/09/2015 0100   PROT 6.7 02/21/2014 1046   ALBUMIN 2.5 (L) 05/09/2015 0100   ALBUMIN 4.4 02/21/2014 1046  AST 15 05/09/2015 0100   ALT 10 (L) 05/09/2015 0100   ALKPHOS 86 05/09/2015 0100   BILITOT 0.5 05/09/2015 0100   GFRNONAA >60 05/09/2015 0100   GFRAA >60 05/09/2015 0100   Lab Results  Component Value Date   CHOL 179 02/21/2014   HDL 46 02/21/2014   LDLCALC 89 02/21/2014   TRIG 219 (H) 02/21/2014   CHOLHDL 3.9 02/21/2014    Lab Results  Component Value Date   VITAMINB12 495 04/23/2015   Lab Results  Component Value Date   TSH 2.302 04/23/2015      ASSESSMENT AND PLAN 82 y.o. year old  Dementia without behavioral issues Continue Namenda 10 mg twice a day   History of stroke, pulmonary emboli, on chronic Coumadin treatment  Levert Feinstein, M.D. Ph.D.  [facility] [address] [place], [place] [address] Phone: [phone] Fax:      [phone]

## 2016-02-15 DIAGNOSIS — M810 Age-related osteoporosis without current pathological fracture: Secondary | ICD-10-CM | POA: Diagnosis not present

## 2016-02-15 DIAGNOSIS — Z9181 History of falling: Secondary | ICD-10-CM | POA: Diagnosis not present

## 2016-02-15 DIAGNOSIS — M5430 Sciatica, unspecified side: Secondary | ICD-10-CM | POA: Diagnosis not present

## 2016-02-15 DIAGNOSIS — J449 Chronic obstructive pulmonary disease, unspecified: Secondary | ICD-10-CM | POA: Diagnosis not present

## 2016-03-14 ENCOUNTER — Encounter: Payer: Self-pay | Admitting: *Deleted

## 2016-03-15 DIAGNOSIS — M25561 Pain in right knee: Secondary | ICD-10-CM | POA: Diagnosis not present

## 2016-03-16 ENCOUNTER — Ambulatory Visit (INDEPENDENT_AMBULATORY_CARE_PROVIDER_SITE_OTHER): Payer: Medicare Other | Admitting: Cardiovascular Disease

## 2016-03-16 ENCOUNTER — Encounter: Payer: Self-pay | Admitting: Cardiovascular Disease

## 2016-03-16 VITALS — BP 140/56 | HR 75 | Ht 62.0 in | Wt 170.0 lb

## 2016-03-16 DIAGNOSIS — I10 Essential (primary) hypertension: Secondary | ICD-10-CM

## 2016-03-16 DIAGNOSIS — I2699 Other pulmonary embolism without acute cor pulmonale: Secondary | ICD-10-CM

## 2016-03-16 DIAGNOSIS — E785 Hyperlipidemia, unspecified: Secondary | ICD-10-CM | POA: Diagnosis not present

## 2016-03-16 NOTE — Progress Notes (Signed)
03/16/2016 Kristin Wilkerson T Wardell   July 06, 1925  [id number]  Primary Physician Murray HodgkinsArthur Green, MD Primary Cardiologist: Runell GessJonathan J Chirstopher Iovino MD Roseanne RenoFACP, FACC, FAHA, FSCAI  HPI:  82 year old female patient followed by me for hypertension and hyperlipidemia. I last saw her in the office 03/06/15. She had echo in 2013 and her last stress test was 2011 which was negative for ischemia. Her husband Kristin MostCharles passed away on 07/16/13 .  She did have arthroplasty performed by Dr. [name] and is currently wheelchair-bound. She had an episode of syncope earlier this year was diagnosed with bilateral pulmonary emboli. The decision was to place her on Coumadin anticoagulation. She is currently a DO NOT RESUSCITATE.   Current Outpatient Prescriptions  Medication Sig Dispense Refill  . acetaminophen (TYLENOL) 500 MG tablet Take 1,000 mg by mouth 3 (three) times daily.    [name]. ALPRAZolam (XANAX) 0.25 MG tablet Take 0.25 mg by mouth every 4 (four) hours as needed for anxiety.    . Amino Acids-Protein Hydrolys (FEEDING SUPPLEMENT, PRO-STAT SUGAR FREE 64,) LIQD Take 30 mLs by mouth daily.    . cephALEXin (KEFLEX) 500 MG capsule Take 500 mg by mouth 3 (three) times daily.    . cholecalciferol (VITAMIN D) 1000 UNITS tablet Take 1,000 Units by mouth daily at 8 pm.     . doxycycline (VIBRA-TABS) 100 MG tablet Take 100 mg by mouth daily.    . DULoxetine (CYMBALTA) 60 MG capsule Take 1 capsule (60 mg total) by mouth daily. One daily to help nerves 30 capsule 6  . furosemide (LASIX) 20 MG tablet Take 1 tablet (20 mg total) by mouth every other day. 30 tablet 5  . hydrocortisone (ANUSOL-HC) 2.5 % rectal cream Place 1 application rectally 2 (two) times daily as needed for hemorrhoids or itching.    . latanoprost (XALATAN) 0.005 % ophthalmic solution PLACE 1 DROP IN BOTH EYES AT BEDTIME TO TREAT GLAUCOMA 7.5 mL 2  . losartan (COZAAR) 100 MG tablet TAKE 1 TABLET BY MOUTH ONCE DAILY FOR BLOOD PRESSURE (Patient taking differently: TAKE 1/2  TABLET BY MOUTH ONCE DAILY FOR BLOOD PRESSURE) 90 tablet 1  . memantine (NAMENDA TITRATION PAK) tablet pack 5 mg/day for =1 week; 5 mg twice daily for =1 week; 15 mg/day given in 5 mg and 10 mg separated doses for =1 week; then 10 mg twice daily 49 tablet 0  . metoprolol (LOPRESSOR) 100 MG tablet TAKE 1 TABLET BY MOUTH TWICE DAILY TO CONTORL BLOOD PRESSURE 180 tablet 1  . mirtazapine (REMERON) 15 MG tablet Take 15 mg by mouth at bedtime.    . Nutritional Supplements (ARGINAID PO) Take by mouth 2 (two) times daily.    . sennosides-docusate sodium (SENOKOT-S) 8.6-50 MG tablet Take 2 tablets by mouth 2 (two) times daily.    [name]. warfarin (COUMADIN) 1 MG tablet Take 1 mg by mouth daily. Taking 1 mg on Mon and Fri and 2 mg on other days of the week.     No current facility-administered medications for this visit.     No Known Allergies  Social History   Social History  . Marital status: Widowed    Spouse name: N/A  . Number of children: 3  . Years of education: 12th   Occupational History  . retired    Social History Main Topics  . Smoking status: Never Smoker  . Smokeless tobacco: Never Used  . Alcohol use No  . Drug use: No  . Sexual activity: No   Other  Topics Concern  . Not on file   Social History Narrative  . No narrative on file     Review of Systems: General: negative for chills, fever, night sweats or weight changes.  Cardiovascular: negative for chest pain, dyspnea on exertion, edema, orthopnea, palpitations, paroxysmal nocturnal dyspnea or shortness of breath Dermatological: negative for rash Respiratory: negative for cough or wheezing Urologic: negative for hematuria Abdominal: negative for nausea, vomiting, diarrhea, bright red blood per rectum, melena, or hematemesis Neurologic: negative for visual changes, syncope, or dizziness All other systems reviewed and are otherwise negative except as noted above.    Blood pressure (!) 140/56, pulse 75, height 5\' 2"   (1.575 m), weight 170 lb (77.1 kg).  General appearance: alert and no distress Neck: no adenopathy, no carotid bruit, no JVD, supple, symmetrical, trachea midline and thyroid not enlarged, symmetric, no tenderness/mass/nodules Lungs: clear to auscultation bilaterally Heart: regular rate and rhythm, S1, S2 normal, no murmur, click, rub or gallop Extremities: extremities normal, atraumatic, no cyanosis or edema  EKG sinus rhythm with 75 without ST or T-wave changes. I personally reviewed this EKG  ASSESSMENT AND PLAN:   Pulmonary embolism (HCC) History of bilateral pulmonary emboli in January this year presenting as syncope. She currently is on Coumadin anticoagulation.  Benign essential HTN History of hypertension blood pressure measures 140/56. She is on metoprolol. Continue current meds at current dosing  Dyslipidemia History of dyslipidemia currently not on statin therapy      Runell GessJonathan J. Roberth Berling MD [facility] CenterFACP,FACC,FAHA, [facility] CarolinasFSCAI 03/16/2016 1:52 PM

## 2016-03-16 NOTE — Assessment & Plan Note (Signed)
History of hypertension blood pressure measures 140/56. She is on metoprolol. Continue current meds at current dosing

## 2016-03-16 NOTE — Assessment & Plan Note (Signed)
History of dyslipidemia currently not on statin therapy

## 2016-03-16 NOTE — Assessment & Plan Note (Signed)
History of bilateral pulmonary emboli in January this year presenting as syncope. She currently is on Coumadin anticoagulation.

## 2016-03-16 NOTE — Patient Instructions (Signed)

## 2016-04-21 DIAGNOSIS — Z96651 Presence of right artificial knee joint: Secondary | ICD-10-CM | POA: Diagnosis not present

## 2016-04-26 DIAGNOSIS — M1990 Unspecified osteoarthritis, unspecified site: Secondary | ICD-10-CM | POA: Diagnosis not present

## 2016-04-26 DIAGNOSIS — F0390 Unspecified dementia without behavioral disturbance: Secondary | ICD-10-CM | POA: Diagnosis not present

## 2016-04-26 DIAGNOSIS — E785 Hyperlipidemia, unspecified: Secondary | ICD-10-CM | POA: Diagnosis not present

## 2016-04-26 DIAGNOSIS — Z9181 History of falling: Secondary | ICD-10-CM | POA: Diagnosis not present

## 2016-04-26 DIAGNOSIS — M25561 Pain in right knee: Secondary | ICD-10-CM | POA: Diagnosis not present

## 2016-04-26 DIAGNOSIS — Z96651 Presence of right artificial knee joint: Secondary | ICD-10-CM | POA: Diagnosis not present

## 2016-04-26 DIAGNOSIS — K5900 Constipation, unspecified: Secondary | ICD-10-CM | POA: Diagnosis not present

## 2016-04-26 DIAGNOSIS — J449 Chronic obstructive pulmonary disease, unspecified: Secondary | ICD-10-CM | POA: Diagnosis not present

## 2016-04-26 DIAGNOSIS — I10 Essential (primary) hypertension: Secondary | ICD-10-CM | POA: Diagnosis not present

## 2016-04-26 DIAGNOSIS — Z7901 Long term (current) use of anticoagulants: Secondary | ICD-10-CM | POA: Diagnosis not present

## 2016-05-20 DIAGNOSIS — Z471 Aftercare following joint replacement surgery: Secondary | ICD-10-CM | POA: Diagnosis not present

## 2016-05-20 DIAGNOSIS — Z96651 Presence of right artificial knee joint: Secondary | ICD-10-CM | POA: Diagnosis not present

## 2016-06-07 DIAGNOSIS — H353131 Nonexudative age-related macular degeneration, bilateral, early dry stage: Secondary | ICD-10-CM | POA: Diagnosis not present

## 2016-06-21 DIAGNOSIS — I4891 Unspecified atrial fibrillation: Secondary | ICD-10-CM | POA: Diagnosis not present

## 2016-06-21 DIAGNOSIS — Z7901 Long term (current) use of anticoagulants: Secondary | ICD-10-CM | POA: Diagnosis not present

## 2016-06-28 DIAGNOSIS — I2699 Other pulmonary embolism without acute cor pulmonale: Secondary | ICD-10-CM | POA: Diagnosis not present

## 2016-06-28 DIAGNOSIS — I4891 Unspecified atrial fibrillation: Secondary | ICD-10-CM | POA: Diagnosis not present

## 2016-07-05 DIAGNOSIS — I4891 Unspecified atrial fibrillation: Secondary | ICD-10-CM | POA: Diagnosis not present

## 2016-07-05 DIAGNOSIS — I2699 Other pulmonary embolism without acute cor pulmonale: Secondary | ICD-10-CM | POA: Diagnosis not present

## 2016-07-12 DIAGNOSIS — I4891 Unspecified atrial fibrillation: Secondary | ICD-10-CM | POA: Diagnosis not present

## 2016-07-12 DIAGNOSIS — I2699 Other pulmonary embolism without acute cor pulmonale: Secondary | ICD-10-CM | POA: Diagnosis not present

## 2016-07-13 DIAGNOSIS — N390 Urinary tract infection, site not specified: Secondary | ICD-10-CM | POA: Diagnosis not present

## 2016-07-13 DIAGNOSIS — R319 Hematuria, unspecified: Secondary | ICD-10-CM | POA: Diagnosis not present

## 2016-07-19 DIAGNOSIS — Z7901 Long term (current) use of anticoagulants: Secondary | ICD-10-CM | POA: Diagnosis not present

## 2016-07-19 DIAGNOSIS — K648 Other hemorrhoids: Secondary | ICD-10-CM | POA: Diagnosis not present

## 2016-07-19 DIAGNOSIS — I2699 Other pulmonary embolism without acute cor pulmonale: Secondary | ICD-10-CM | POA: Diagnosis not present

## 2016-07-19 DIAGNOSIS — I4891 Unspecified atrial fibrillation: Secondary | ICD-10-CM | POA: Diagnosis not present

## 2016-07-20 DIAGNOSIS — F0391 Unspecified dementia with behavioral disturbance: Secondary | ICD-10-CM | POA: Diagnosis not present

## 2016-07-20 DIAGNOSIS — I10 Essential (primary) hypertension: Secondary | ICD-10-CM | POA: Diagnosis not present

## 2016-07-20 DIAGNOSIS — J449 Chronic obstructive pulmonary disease, unspecified: Secondary | ICD-10-CM | POA: Diagnosis not present

## 2016-07-20 DIAGNOSIS — M4800 Spinal stenosis, site unspecified: Secondary | ICD-10-CM | POA: Diagnosis not present

## 2016-07-26 DIAGNOSIS — Z7901 Long term (current) use of anticoagulants: Secondary | ICD-10-CM | POA: Diagnosis not present

## 2016-07-26 DIAGNOSIS — I4891 Unspecified atrial fibrillation: Secondary | ICD-10-CM | POA: Diagnosis not present

## 2016-08-02 DIAGNOSIS — I2699 Other pulmonary embolism without acute cor pulmonale: Secondary | ICD-10-CM | POA: Diagnosis not present

## 2016-08-02 DIAGNOSIS — I4891 Unspecified atrial fibrillation: Secondary | ICD-10-CM | POA: Diagnosis not present

## 2016-08-09 DIAGNOSIS — I4891 Unspecified atrial fibrillation: Secondary | ICD-10-CM | POA: Diagnosis not present

## 2016-08-09 DIAGNOSIS — Z7901 Long term (current) use of anticoagulants: Secondary | ICD-10-CM | POA: Diagnosis not present

## 2016-08-14 DIAGNOSIS — R05 Cough: Secondary | ICD-10-CM | POA: Diagnosis not present

## 2016-08-14 DIAGNOSIS — R0989 Other specified symptoms and signs involving the circulatory and respiratory systems: Secondary | ICD-10-CM | POA: Diagnosis not present

## 2016-08-15 DIAGNOSIS — R062 Wheezing: Secondary | ICD-10-CM | POA: Diagnosis not present

## 2016-08-15 DIAGNOSIS — D649 Anemia, unspecified: Secondary | ICD-10-CM | POA: Diagnosis not present

## 2016-08-15 DIAGNOSIS — R0602 Shortness of breath: Secondary | ICD-10-CM | POA: Diagnosis not present

## 2016-08-15 DIAGNOSIS — I509 Heart failure, unspecified: Secondary | ICD-10-CM | POA: Diagnosis not present

## 2016-08-15 DIAGNOSIS — R0902 Hypoxemia: Secondary | ICD-10-CM | POA: Diagnosis not present

## 2016-08-16 DIAGNOSIS — Z86718 Personal history of other venous thrombosis and embolism: Secondary | ICD-10-CM | POA: Diagnosis not present

## 2016-08-16 DIAGNOSIS — Z8673 Personal history of transient ischemic attack (TIA), and cerebral infarction without residual deficits: Secondary | ICD-10-CM | POA: Diagnosis not present

## 2016-08-16 DIAGNOSIS — I4891 Unspecified atrial fibrillation: Secondary | ICD-10-CM | POA: Diagnosis not present

## 2016-08-17 DIAGNOSIS — I509 Heart failure, unspecified: Secondary | ICD-10-CM | POA: Diagnosis not present

## 2016-08-17 DIAGNOSIS — R0602 Shortness of breath: Secondary | ICD-10-CM | POA: Diagnosis not present

## 2016-08-19 ENCOUNTER — Encounter: Payer: Self-pay | Admitting: Cardiovascular Disease

## 2016-08-19 ENCOUNTER — Ambulatory Visit (INDEPENDENT_AMBULATORY_CARE_PROVIDER_SITE_OTHER): Payer: Medicare Other | Admitting: Cardiovascular Disease

## 2016-08-19 VITALS — BP 110/58 | HR 81 | Ht 62.0 in | Wt 173.0 lb

## 2016-08-19 DIAGNOSIS — I10 Essential (primary) hypertension: Secondary | ICD-10-CM

## 2016-08-19 DIAGNOSIS — E785 Hyperlipidemia, unspecified: Secondary | ICD-10-CM | POA: Diagnosis not present

## 2016-08-19 DIAGNOSIS — L0232 Furuncle of buttock: Secondary | ICD-10-CM | POA: Diagnosis not present

## 2016-08-19 NOTE — Assessment & Plan Note (Signed)
History of hyperlipidemia not on statin therapy followed by her PCP 

## 2016-08-19 NOTE — Assessment & Plan Note (Signed)
History of pulmonary embolism in the past on Coumadin anticoagulation 

## 2016-08-19 NOTE — Assessment & Plan Note (Signed)
History of hypertension with blood pressure measured today 110/58. She is on metoprolol. Continue current meds at current dosing

## 2016-08-19 NOTE — Progress Notes (Signed)
08/19/2016 Kristin Wilkerson   1926-01-29  [id number]  Primary Physician Kristin Wilkerson Green, Kristin Wilkerson Primary Cardiologist: Kristin GessJonathan J Allysen Lazo Kristin Wilkerson Kristin Wilkerson, Kristin Wilkerson, Kristin Wilkerson, Kristin Wilkerson  HPI:  83 year old female patient followed by me for hypertension and hyperlipidemia. I last saw her in the office 03/16/16. She had echo in 2013 and her last stress test was 2011 which was negative for ischemia. Her husband Kristin Wilkerson passed away on 07/16/13 .  She did have arthroplasty performed by Kristin Wilkerson. Darrelyn Wilkerson and is currently wheelchair-bound. She had an episode of syncope earlier this year was diagnosed with bilateral pulmonary emboli. The decision was to place her on Coumadin anticoagulation. She is currently a DO NOT RESUSCITATE. She apparently has been wheezing lately and was put on oxygen. She has a diagnosis of COPD and was told that she had mild congestive heart failure although she has not had that diagnosis in our chart. Her last 2-D echocardiogram performed 01/22/15 revealed normal LV systolic function with grade 1 diastolic dysfunction. She denies chest pain.   Current Outpatient Prescriptions  Medication Sig Dispense Refill  . acetaminophen (TYLENOL) 500 MG tablet Take 1,000 mg by mouth 3 (three) times daily.    . cephALEXin (KEFLEX) 500 MG capsule Take 500 mg by mouth 3 (three) times daily.    . cholecalciferol (VITAMIN D) 1000 UNITS tablet Take 1,000 Units by mouth daily at 8 pm.     . DULoxetine (CYMBALTA) 60 MG capsule Take 1 capsule (60 mg total) by mouth daily. One daily to help nerves 30 capsule 6  . fluticasone-salmeterol (ADVAIR HFA) 115-21 MCG/ACT inhaler Inhale 2 puffs into the lungs 2 (two) times daily.    . furosemide (LASIX) 20 MG tablet Take 1 tablet (20 mg total) by mouth every other day. 30 tablet 5  . hydrocortisone (ANUSOL-HC) 2.5 % rectal cream Place 1 application rectally 2 (two) times daily as needed for hemorrhoids or itching.    . latanoprost (XALATAN) 0.005 % ophthalmic solution PLACE 1 DROP IN BOTH EYES AT  BEDTIME TO TREAT GLAUCOMA 7.5 mL 2  . losartan (COZAAR) 100 MG tablet TAKE 1 TABLET BY MOUTH ONCE DAILY FOR BLOOD PRESSURE (Patient taking differently: TAKE 1/2 TABLET BY MOUTH ONCE DAILY FOR BLOOD PRESSURE) 90 tablet 1  . meclizine (ANTIVERT) 12.5 MG tablet Take 12.5 mg by mouth as directed.    . memantine (NAMENDA TITRATION PAK) tablet pack 5 mg/day for =1 week; 5 mg twice daily for =1 week; 15 mg/day given in 5 mg and 10 mg separated doses for =1 week; then 10 mg twice daily 49 tablet 0  . metoprolol (LOPRESSOR) 100 MG tablet TAKE 1 TABLET BY MOUTH TWICE DAILY TO CONTORL BLOOD PRESSURE 180 tablet 1  . mirtazapine (REMERON) 15 MG tablet Take 15 mg by mouth at bedtime.    . Nutritional Supplements (ARGINAID PO) Take by mouth 2 (two) times daily.    . sennosides-docusate sodium (SENOKOT-S) 8.6-50 MG tablet Take 2 tablets by mouth 2 (two) times daily.    [name]. warfarin (COUMADIN) 1 MG tablet Take 1 mg by mouth daily. Taking 1 mg on Mon and Fri and 2 mg on other days of the week.     No current facility-administered medications for this visit.     No Known Allergies  Social History   Social History  . Marital status: Widowed    Spouse name: N/A  . Number of children: 3  . Years of education: 12th   Occupational History  . retired  Social History Main Topics  . Smoking status: Never Smoker  . Smokeless tobacco: Never Used  . Alcohol use No  . Drug use: No  . Sexual activity: No   Other Topics Concern  . Not on file   Social History Narrative  . No narrative on file     Review of Systems: General: negative for chills, fever, night sweats or weight changes.  Cardiovascular: negative for chest pain, dyspnea on exertion, edema, orthopnea, palpitations, paroxysmal nocturnal dyspnea or shortness of breath Dermatological: negative for rash Respiratory: negative for cough or wheezing Urologic: negative for hematuria Abdominal: negative for nausea, vomiting, diarrhea, bright red blood  per rectum, melena, or hematemesis Neurologic: negative for visual changes, syncope, or dizziness All other systems reviewed and are otherwise negative except as noted above.    Blood pressure (!) 110/58, pulse 81, height 5\' 2"  (1.575 m), weight 173 lb (78.5 kg).  General appearance: alert and no distress Neck: no adenopathy, no carotid bruit, no JVD, supple, symmetrical, trachea midline and thyroid not enlarged, symmetric, no tenderness/mass/nodules Lungs: clear to auscultation bilaterally Heart: regular rate and rhythm, S1, S2 normal, no murmur, click, rub or gallop Extremities: extremities normal, atraumatic, no cyanosis or edema  EKG sinus rhythm at 81 without ST or T-wave changes. I personally reviewed this EKG  ASSESSMENT AND PLAN:   Pulmonary embolism (HCC) History of pulmonary embolism in the past on Coumadin anticoagulation  Benign essential HTN History of hypertension with blood pressure measured today 110/58. She is on metoprolol. Continue current meds at current dosing  Dyslipidemia History of hyperlipidemia not on statin therapy followed by her PCP      Kristin Gess Kristin Wilkerson [facility], [facility] 08/19/2016 12:02 PM

## 2016-08-19 NOTE — Patient Instructions (Signed)

## 2016-08-20 DIAGNOSIS — R0989 Other specified symptoms and signs involving the circulatory and respiratory systems: Secondary | ICD-10-CM | POA: Diagnosis not present

## 2016-08-20 DIAGNOSIS — R0602 Shortness of breath: Secondary | ICD-10-CM | POA: Diagnosis not present

## 2016-08-21 ENCOUNTER — Inpatient Hospital Stay (HOSPITAL_COMMUNITY)
Admission: EM | Admit: 2016-08-21 | Discharge: 2016-08-28 | DRG: 689 | Disposition: A | Discharge status: Skilled Nursing Facility | Payer: Medicare Other | Attending: Family Medicine | Admitting: Family Medicine

## 2016-08-21 ENCOUNTER — Emergency Department (HOSPITAL_COMMUNITY): Payer: Medicare Other

## 2016-08-21 ENCOUNTER — Encounter (HOSPITAL_COMMUNITY): Payer: Self-pay | Admitting: Emergency Medicine

## 2016-08-21 DIAGNOSIS — I5031 Acute diastolic (congestive) heart failure: Secondary | ICD-10-CM | POA: Diagnosis not present

## 2016-08-21 DIAGNOSIS — R404 Transient alteration of awareness: Secondary | ICD-10-CM | POA: Diagnosis not present

## 2016-08-21 DIAGNOSIS — R791 Abnormal coagulation profile: Secondary | ICD-10-CM | POA: Diagnosis present

## 2016-08-21 DIAGNOSIS — N390 Urinary tract infection, site not specified: Principal | ICD-10-CM

## 2016-08-21 DIAGNOSIS — Z96651 Presence of right artificial knee joint: Secondary | ICD-10-CM | POA: Diagnosis present

## 2016-08-21 DIAGNOSIS — K222 Esophageal obstruction: Secondary | ICD-10-CM

## 2016-08-21 DIAGNOSIS — Z801 Family history of malignant neoplasm of trachea, bronchus and lung: Secondary | ICD-10-CM

## 2016-08-21 DIAGNOSIS — F331 Major depressive disorder, recurrent, moderate: Secondary | ICD-10-CM | POA: Diagnosis present

## 2016-08-21 DIAGNOSIS — K2100 Gastro-esophageal reflux disease with esophagitis, without bleeding: Secondary | ICD-10-CM

## 2016-08-21 DIAGNOSIS — N179 Acute kidney failure, unspecified: Secondary | ICD-10-CM | POA: Diagnosis present

## 2016-08-21 DIAGNOSIS — R05 Cough: Secondary | ICD-10-CM | POA: Diagnosis not present

## 2016-08-21 DIAGNOSIS — E785 Hyperlipidemia, unspecified: Secondary | ICD-10-CM | POA: Diagnosis present

## 2016-08-21 DIAGNOSIS — D6832 Hemorrhagic disorder due to extrinsic circulating anticoagulants: Secondary | ICD-10-CM

## 2016-08-21 DIAGNOSIS — R0602 Shortness of breath: Secondary | ICD-10-CM | POA: Diagnosis not present

## 2016-08-21 DIAGNOSIS — J90 Pleural effusion, not elsewhere classified: Secondary | ICD-10-CM

## 2016-08-21 DIAGNOSIS — J189 Pneumonia, unspecified organism: Secondary | ICD-10-CM

## 2016-08-21 DIAGNOSIS — K449 Diaphragmatic hernia without obstruction or gangrene: Secondary | ICD-10-CM | POA: Diagnosis present

## 2016-08-21 DIAGNOSIS — I110 Hypertensive heart disease with heart failure: Secondary | ICD-10-CM | POA: Diagnosis present

## 2016-08-21 DIAGNOSIS — Z66 Do not resuscitate: Secondary | ICD-10-CM | POA: Diagnosis present

## 2016-08-21 DIAGNOSIS — T45515A Adverse effect of anticoagulants, initial encounter: Secondary | ICD-10-CM

## 2016-08-21 DIAGNOSIS — D689 Coagulation defect, unspecified: Secondary | ICD-10-CM | POA: Diagnosis not present

## 2016-08-21 DIAGNOSIS — F0390 Unspecified dementia without behavioral disturbance: Secondary | ICD-10-CM | POA: Diagnosis present

## 2016-08-21 DIAGNOSIS — Z8673 Personal history of transient ischemic attack (TIA), and cerebral infarction without residual deficits: Secondary | ICD-10-CM

## 2016-08-21 DIAGNOSIS — B9620 Unspecified Escherichia coli [E. coli] as the cause of diseases classified elsewhere: Secondary | ICD-10-CM | POA: Diagnosis present

## 2016-08-21 DIAGNOSIS — J9600 Acute respiratory failure, unspecified whether with hypoxia or hypercapnia: Secondary | ICD-10-CM | POA: Diagnosis not present

## 2016-08-21 DIAGNOSIS — R531 Weakness: Secondary | ICD-10-CM | POA: Diagnosis not present

## 2016-08-21 DIAGNOSIS — R1312 Dysphagia, oropharyngeal phase: Secondary | ICD-10-CM | POA: Diagnosis present

## 2016-08-21 DIAGNOSIS — Z993 Dependence on wheelchair: Secondary | ICD-10-CM

## 2016-08-21 DIAGNOSIS — Z79899 Other long term (current) drug therapy: Secondary | ICD-10-CM

## 2016-08-21 DIAGNOSIS — D509 Iron deficiency anemia, unspecified: Secondary | ICD-10-CM | POA: Diagnosis present

## 2016-08-21 DIAGNOSIS — R4182 Altered mental status, unspecified: Secondary | ICD-10-CM | POA: Diagnosis not present

## 2016-08-21 DIAGNOSIS — Z7901 Long term (current) use of anticoagulants: Secondary | ICD-10-CM

## 2016-08-21 DIAGNOSIS — Z823 Family history of stroke: Secondary | ICD-10-CM

## 2016-08-21 DIAGNOSIS — Z86711 Personal history of pulmonary embolism: Secondary | ICD-10-CM

## 2016-08-21 DIAGNOSIS — E876 Hypokalemia: Secondary | ICD-10-CM | POA: Diagnosis not present

## 2016-08-21 IMAGING — CT CT ANGIO CHEST
2 of 6 series · 18 of 36 positions shown · IV contrast ([ID] [id number])
Comparison: Chest radiograph dated 05/08/2015

CLINICAL DATA: 89-year-old female with recent right knee surgery
presenting with syncope and tachycardia.

EXAM:
CT ANGIOGRAPHY CHEST WITH CONTRAST
TECHNIQUE: Multidetector CT imaging of the chest was performed using the
standard protocol during bolus administration of intravenous
contrast. Multiplanar CT image reconstructions and MIPs were
obtained to evaluate the vascular anatomy.
CONTRAST:  100mL OMNIPAQUE IOHEXOL 350 MG/ML SOLN

[Series 5: thins for pacs · axial · 0.56mm/px · z∈[-227,-36]mm · 17 of 213 slices shown]
[im 11/213  lung]
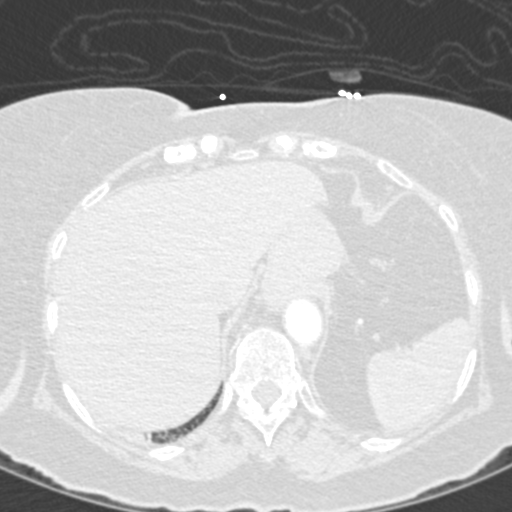
[im 22/213  mediastinal]
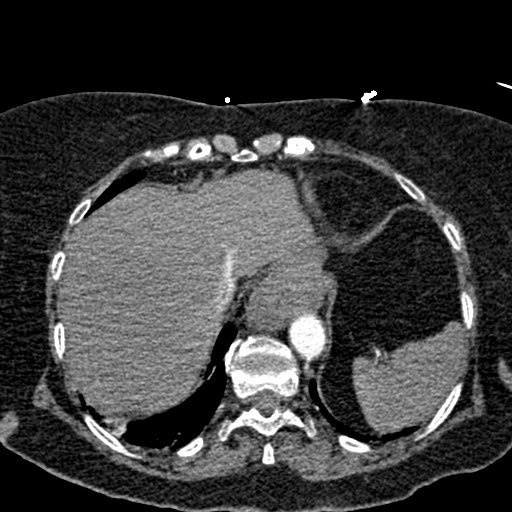
[im 32/213  lung]
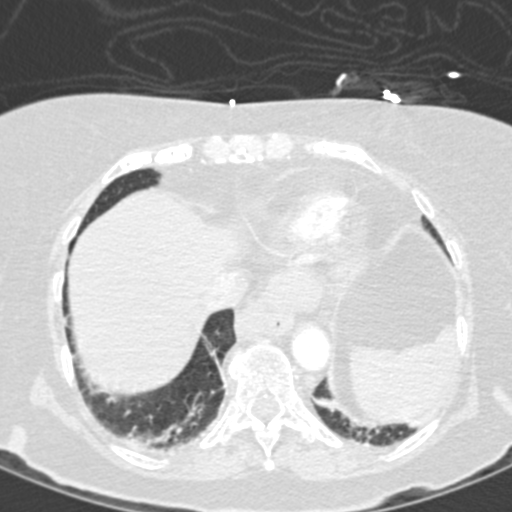
[im 43/213  mediastinal]
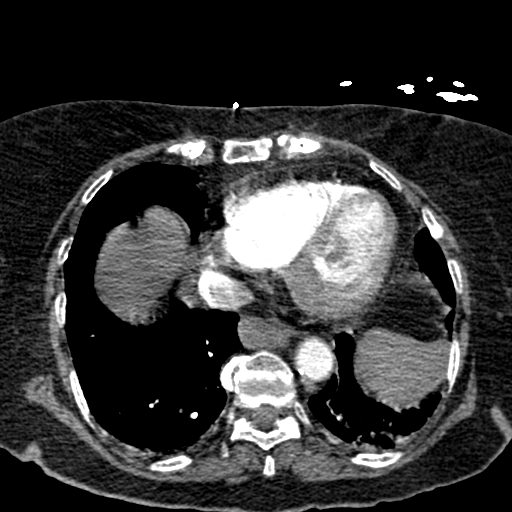
[im 64/213  lung]
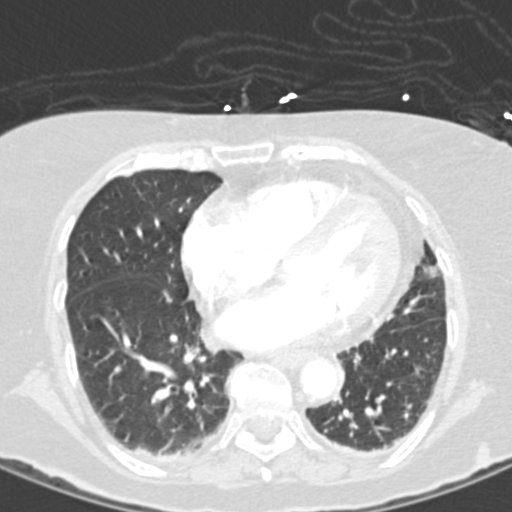
[im 75/213  mediastinal]
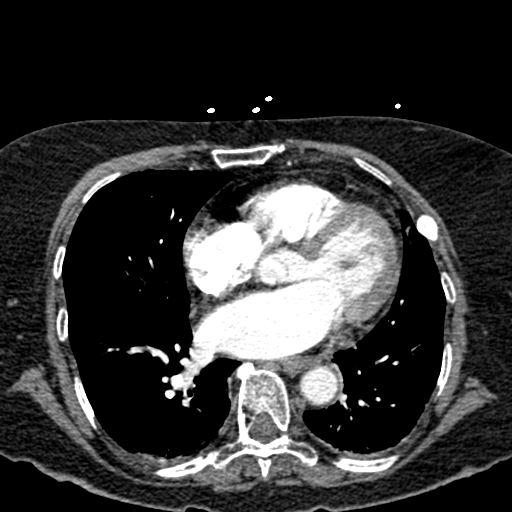
[im 85/213  lung]
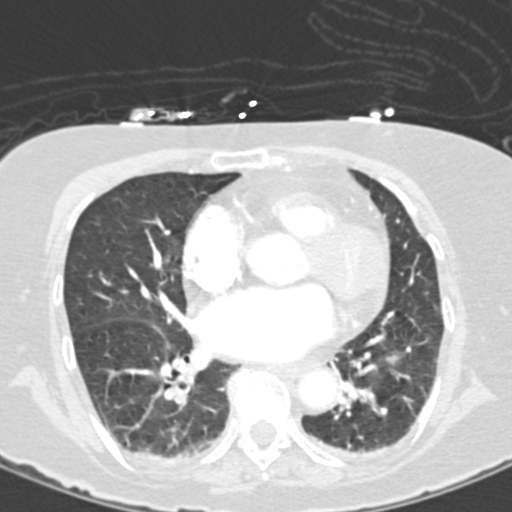
[im 96/213  mediastinal]
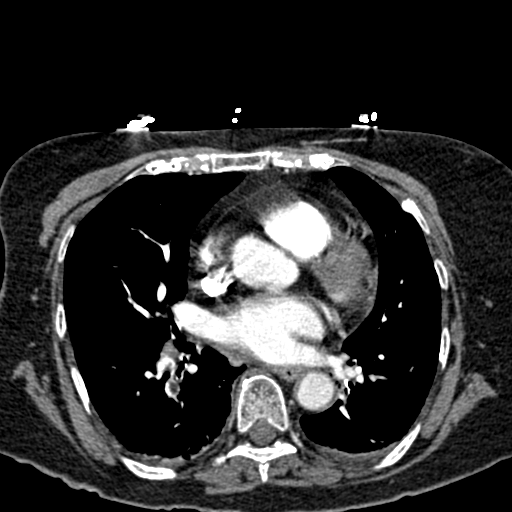
[im 107/213  lung]
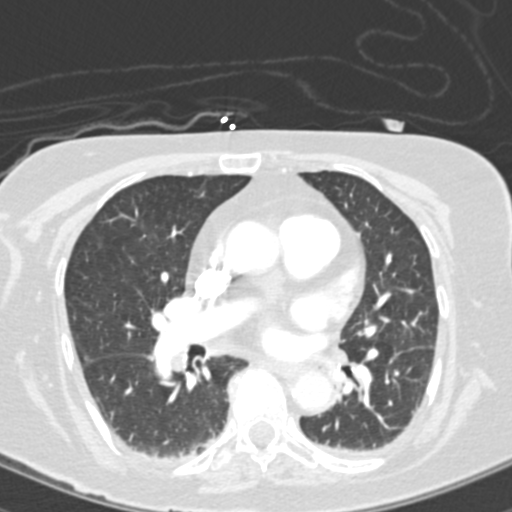
[im 117/213  mediastinal]
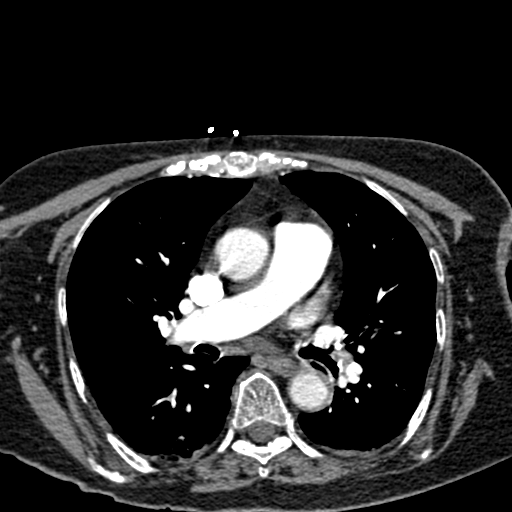
[im 128/213  lung]
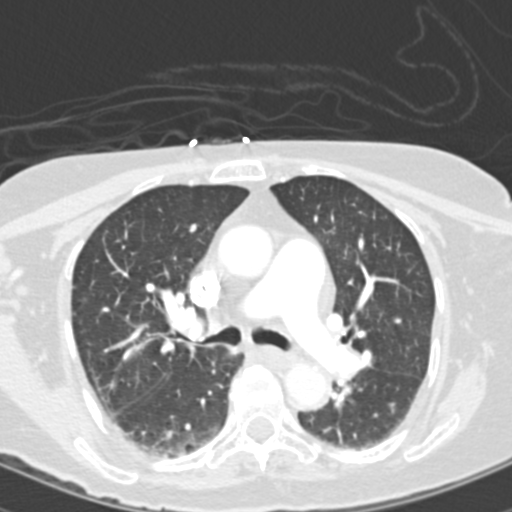
[im 138/213  mediastinal]
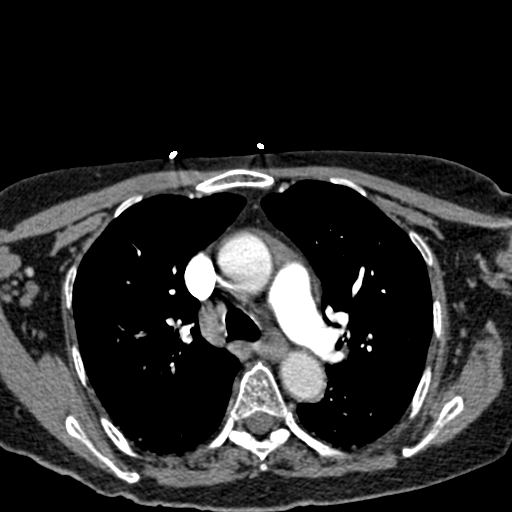
[im 149/213  lung]
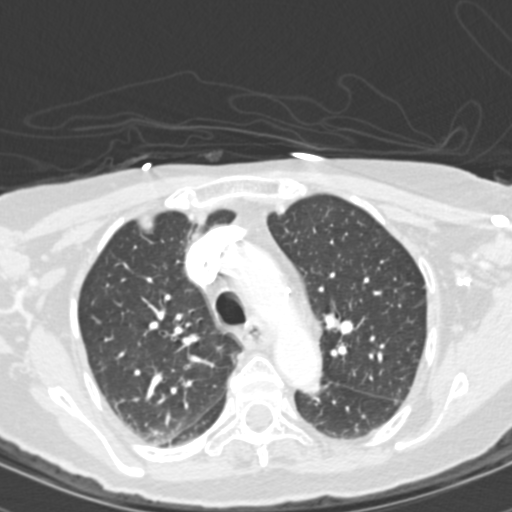
[im 170/213  mediastinal]
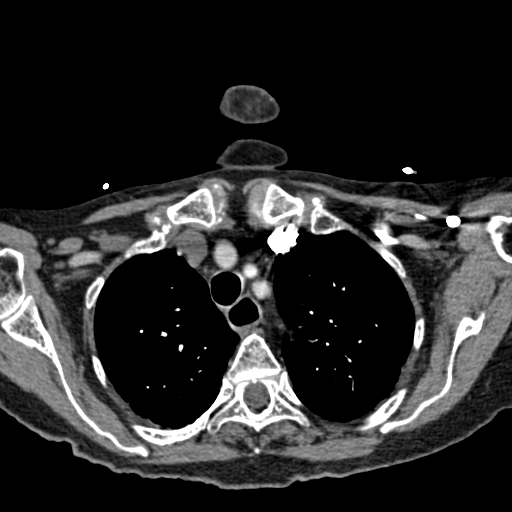
[im 181/213  lung]
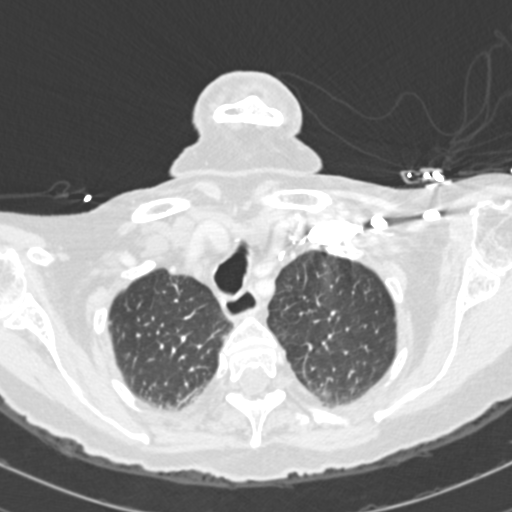
[im 191/213  mediastinal]
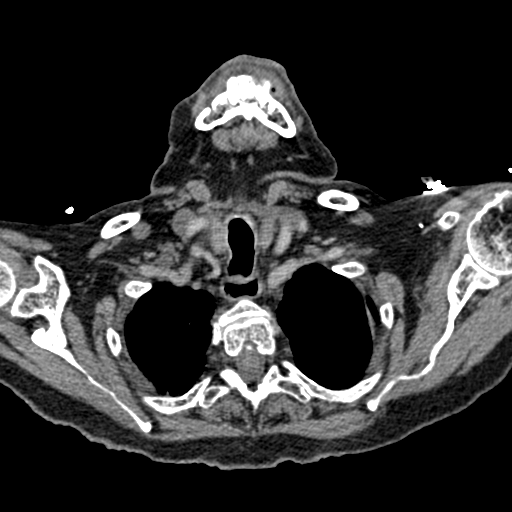
[im 202/213  lung]
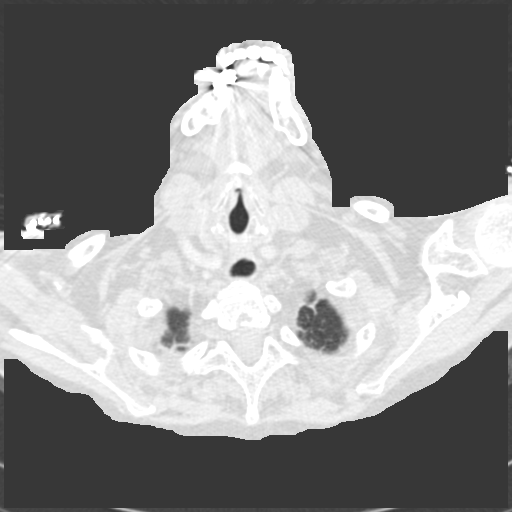

[Series 7: coronal mpr · coronal · 0.41mm/px · 1 of 105 slices shown]
[im 53/105  mediastinal]
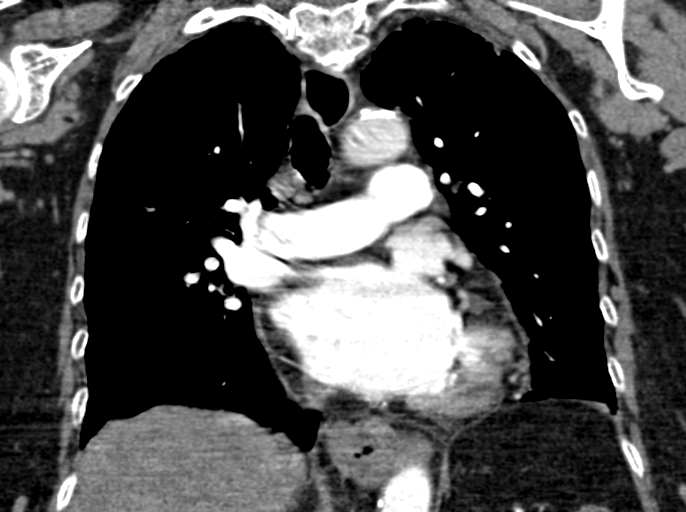

[18 of 36 positions shown; findings below may reference images not displayed]

FINDINGS: Minimal bibasilar dependent atelectatic changes. There is no focal
consolidation, pleural effusion, or pneumothorax. The central
airways are patent.

Mild atherosclerotic calcification of the aorta. No evidence of
thoracic aortic aneurysm or dissection. There are pulmonary emboli
involving the bilateral lower lobe segmental branch point. The right
lower lobe embolus extends into the subsegmental pulmonary artery
branches. Small right middle lobe pulmonary artery embolus noted.
There is no cardiomegaly or pericardial effusion. No CT evidence of
right cardiac straining. There is no mediastinal adenopathy.
Top-normal bilateral hilar lymph nodes noted. There is a moderate
size hiatal hernia. The esophagus is grossly unremarkable.

There is no axillary adenopathy. The chest wall soft tissues appear
unremarkable. There is degenerative changes of the spine and
multiple old left anterior rib fractures. No acute fracture.

The visualized upper abdomen appears unremarkable.

Review of the MIP images confirms the above findings.
IMPRESSION: Bilateral lower lobe pulmonary artery emboli. No CT evidence of
right heart strain.

These results were called by telephone at the time of interpretation
on 05/08/2015 at [DATE] to Nurse Maria Helena, who verbally
acknowledged these results.

## 2016-08-21 IMAGING — CR DG CHEST 1V
1 series · 1 of 1 positions shown · non-contrast
Comparison: Radiograph dated 04/17/2015

CLINICAL DATA: 89-year-old female with syncope

EXAM:
CHEST 1 VIEW

[x chest ap]
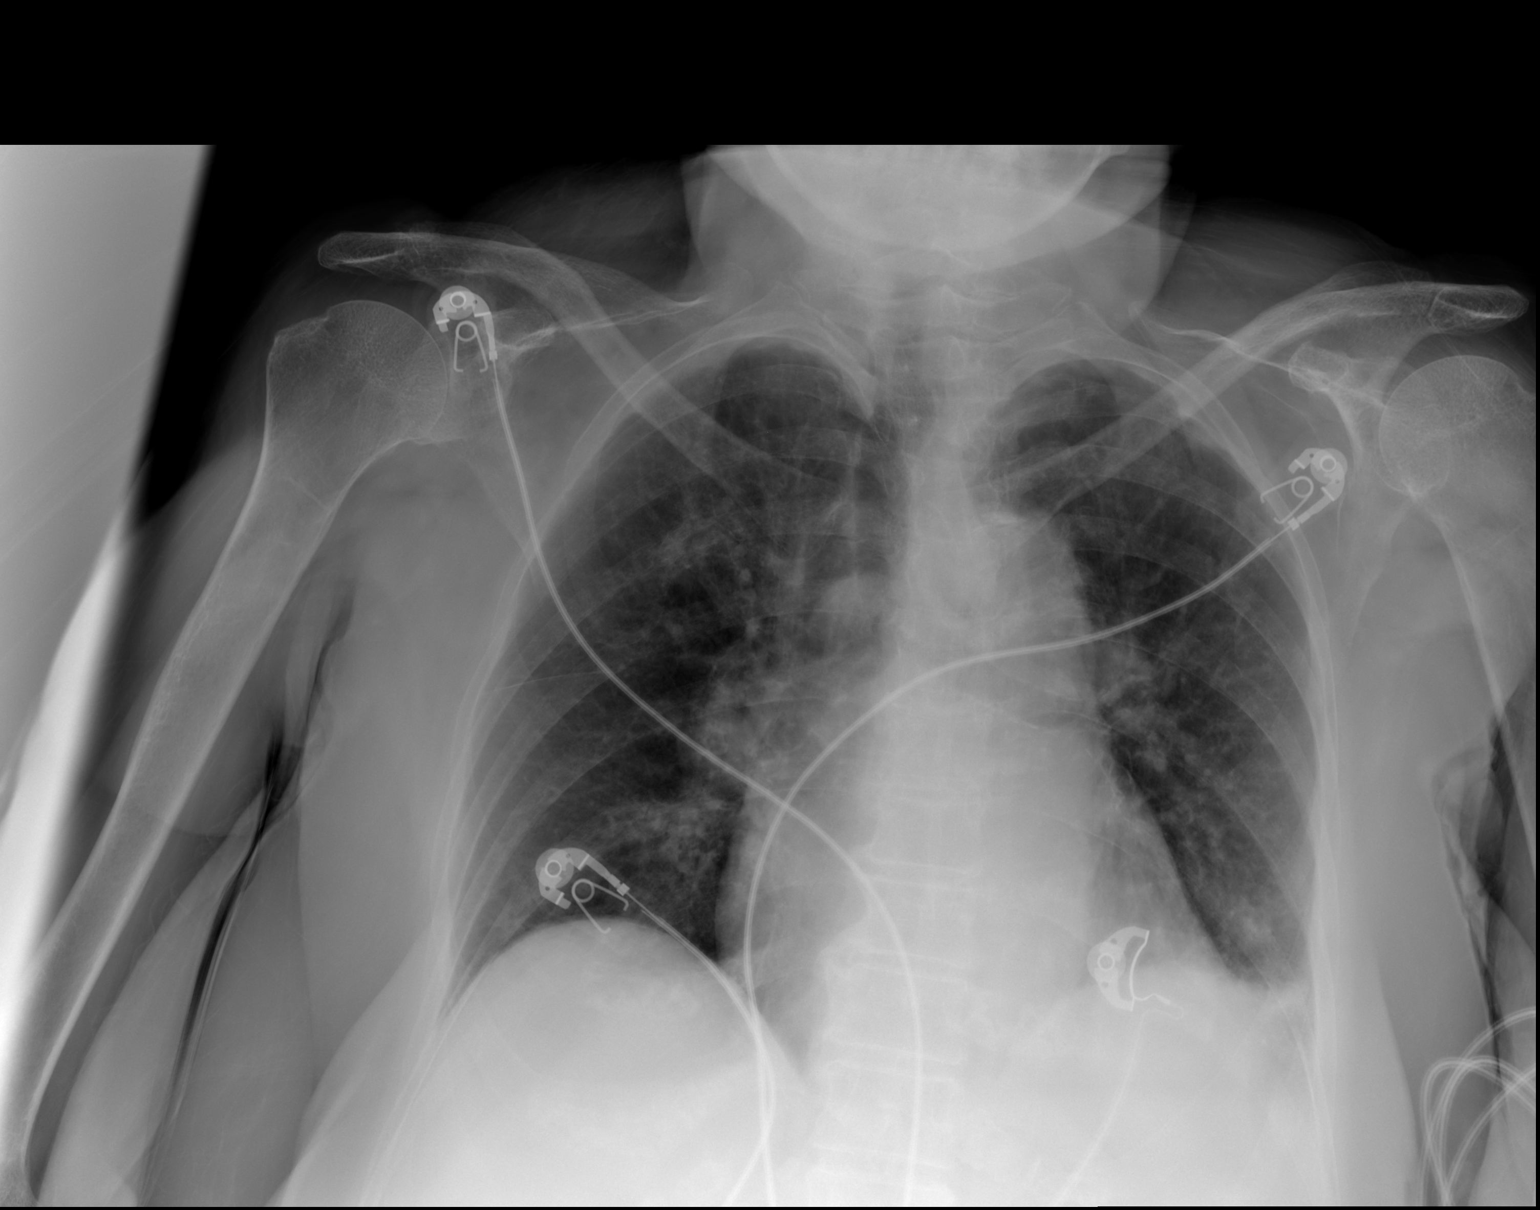

[1 of 1 positions shown; findings below may reference images not displayed]

FINDINGS: Single view of the chest demonstrates small focal density at the
left costophrenic angle, likely a subsegmental atelectatic changes.
Pneumonia is less likely but not excluded. Clinical correlation is
recommended. There is a focal area nodularity and increased
vascularity at the right lung base. No significant pleural effusion.
No pneumothorax. Stable cardiac silhouette. No acute osseous
pathology.
IMPRESSION: Bibasilar densities and vascular prominence, likely atelectatic
changes. Pneumonia is not excluded. Clinical correlation is
recommended.

## 2016-08-21 NOTE — ED Triage Notes (Addendum)
Patient is coming from [facility] from [place]. Patient has been weak and lethargic for 36 hours. Patient is on 2 L  all the time. Patient has dementia. Patient has a DNR.

## 2016-08-21 NOTE — ED Provider Notes (Signed)
[facility]-EMERGENCY DEPT Provider Note   CSN: [id number] Arrival date & time: 08/21/16  2257  By signing my name below, I, Cynda Acres, attest that this documentation has been prepared under the direction and in the presence of Devoria Albe, MD. Electronically Signed: Cynda Acres, Scribe. 08/21/16. 11:54 PM.  Time seen 23:44 PM   History   Chief Complaint Chief Complaint  Patient presents with  . Weakness   LEVEL 5 CAVEAT DUE TO DEMENTIA   HPI Comments: Kristin Wilkerson is a 83 y.o. female with a history of HTN. HLD, CHF, PE on coumadin, and dementia, who presents to the Emergency Department with daughter who reports a sudden-onset, persistent cough that began one week ago. According to daughter at bedside a lot of the residents at her center have had upper respiratory symptoms, which she has developed. Patient currently resides at [facility] in [place]. Daughter states the patient has had two unrevealing chest x-rays in the past week. Patient was recently placed on an increased dose of lasix and two antibiotics. She states she was told week ago her mother had a "slightly enlarged heart". They thought she was having some pulmonary edema. Current cardiologist is Dr. Allyson Sabal. She was seen by her cardiologist last week, April 4. Patient has had associated hypotension (110/80) and shortness of breath. Daughter states the patient has not had any  LE edema. She has had fever up to 102 a few  degrees ago. Her pulse ox dropped into the 80s last week and she has been on oxygen all week. Tonight she was called because her pulse ox was 90. Her facility thought her breathing was worse when they called the daughter and our prior to arrival and they brought her to the ED.  HPI  PCP Dr Arlyce Dice and Andrey Campanile   Past Medical History:  Diagnosis Date  . Anemia   . Anxiety   . CHF (congestive heart failure) (HCC)   . Dementia   . Hyperlipidemia   . Hypertension     Patient Active Problem List   Diagnosis Date Noted  . AKI (acute kidney injury) (HCC) 08/22/2016  . CAP (community acquired pneumonia) 08/22/2016  . Elevated INR 08/22/2016  . Acute lower UTI 08/22/2016  . UTI (urinary tract infection) 08/22/2016  . Pulmonary embolism (HCC) 05/08/2015  . Anxiety and depression 05/08/2015  . Chronic diastolic CHF (congestive heart failure), NYHA class 1 (HCC) 05/08/2015  . Benign essential HTN 05/08/2015  . Dyslipidemia 05/08/2015  . Normocytic anemia 05/08/2015  . Syncope 05/08/2015  . DNR (do not resuscitate)   . Palliative care encounter   . Pulmonary embolism, bilateral (HCC)   . Dementia 05/01/2015    Past Surgical History:  Procedure Laterality Date  . ABDOMINAL HYSTERECTOMY    . BILATERAL OOPHORECTOMY    . CARDIOVASCULAR STRESS TEST  11/16/2009   Perfusion defect seen in inferior myocardial region consistent with diaphragmatic attenuation. Remaining myocardium demonstrates normalmyocardial perfusion with no evidence of ischemia or infarct. No ECG changes. EKG negative for ischemia.  . CHOLECYSTECTOMY    . I&D KNEE WITH POLY EXCHANGE Right 04/21/2015   Procedure: IRRIGATION AND DEBRIDEMENT RIGHT KNEE WITH PATELLECTOMY AND REPAIR OF COMPLEX OPEN WOUND;  Surgeon: Ranee Gosselin, MD;  Location: [facility] ORS;  Service: Orthopedics;  Laterality: Right;  . I&D RIGHT KNEE WITH PATELLECTOMY  04/21/2015  . LUMBAR LAMINECTOMY  12/04/2012   Complete decompressive L3-4 and L4-5--Dr. Darrelyn Hillock  . TOTAL KNEE ARTHROPLASTY Right 02/11/2015   Procedure: TOTAL KNEE ARTHROPLASTY;  Surgeon: Ranee Gosselin, MD;  Location: [facility];  Service: Orthopedics;  Laterality: Right;  . TRANSTHORACIC ECHOCARDIOGRAM  09/01/2011   EF >55% and moderate tricuspid valve regurg.    OB History    No data available       Home Medications    Prior to Admission medications   Medication Sig Start Date End Date Taking? Authorizing Provider  acetaminophen (TYLENOL) 500 MG tablet Take 1,000 mg by mouth 3 (three) times  daily.   Yes [provider]  albuterol (PROVENTIL) (2.5 MG/3ML) 0.083% nebulizer solution Take 2.5 mg by nebulization every 4 (four) hours as needed for wheezing or shortness of breath.   Yes [provider]  calcium carbonate (TUMS - DOSED IN MG ELEMENTAL CALCIUM) 500 MG chewable tablet Chew 2 tablets by mouth every 4 (four) hours as needed for indigestion or heartburn.   Yes [provider]  cholecalciferol (VITAMIN D) 1000 UNITS tablet Take 1,000 Units by mouth daily at 8 pm.    Yes [provider]  DULoxetine (CYMBALTA) 60 MG capsule Take 1 capsule (60 mg total) by mouth daily. One daily to help nerves 05/01/15  Yes Levert Feinstein, MD  fluticasone-salmeterol (ADVAIR HFA) 115-21 MCG/ACT inhaler Inhale 2 puffs into the lungs 2 (two) times daily.   Yes [provider]  furosemide (LASIX) 20 MG tablet Take 1 tablet (20 mg total) by mouth every other day. Patient taking differently: Take 40 mg by mouth 2 (two) times daily.  04/24/15  Yes Constable, Amber, PA-C  guaiFENesin-dextromethorphan (ROBITUSSIN DM) 100-10 MG/5ML syrup Take 15 mLs by mouth every 4 (four) hours as needed for cough.   Yes [provider]  latanoprost (XALATAN) 0.005 % ophthalmic solution PLACE 1 DROP IN BOTH EYES AT BEDTIME TO TREAT GLAUCOMA 02/23/15  Yes Kimber Relic, MD  losartan (COZAAR) 100 MG tablet TAKE 1 TABLET BY MOUTH ONCE DAILY FOR BLOOD PRESSURE Patient taking differently: TAKE 50 MG BY MOUTH ONCE DAILY FOR BLOOD PRESSURE 11/17/14  Yes Kimber Relic, MD  meclizine (ANTIVERT) 12.5 MG tablet Take 12.5 mg by mouth 3 (three) times daily as needed for nausea.    Yes [provider]  memantine (NAMENDA TITRATION PAK) tablet pack 5 mg/day for =1 week; 5 mg twice daily for =1 week; 15 mg/day given in 5 mg and 10 mg separated doses for =1 week; then 10 mg twice daily Patient taking differently: Take 10 mg by mouth 2 (two) times daily.  07/30/15  Yes Butch Penny, NP    metoprolol (LOPRESSOR) 100 MG tablet TAKE 1 TABLET BY MOUTH TWICE DAILY TO CONTORL BLOOD PRESSURE Patient taking differently: TAKE 100 MG BY MOUTH TWICE DAILY TO CONTORL BLOOD PRESSURE 06/23/14  Yes Kimber Relic, MD  mirtazapine (REMERON) 15 MG tablet Take 15 mg by mouth at bedtime.   Yes [provider]  ranitidine (ZANTAC) 150 MG tablet Take 150 mg by mouth 2 (two) times daily.   Yes [provider]  sennosides-docusate sodium (SENOKOT-S) 8.6-50 MG tablet Take 2 tablets by mouth daily as needed for constipation.    Yes [provider]  sulfacetamide (BLEPH-10) 10 % ophthalmic solution Place 1 drop into the right eye 4 (four) times daily.   Yes [provider]  tobramycin (TOBREX) 0.3 % ophthalmic solution Place 1-2 drops into both eyes 4 (four) times daily.   Yes [provider]  warfarin (COUMADIN) 1 MG tablet Take 1-2 mg by mouth See admin instructions. Taking 1 mg  on Mon, Wed, Fri  -- Taking 2 mg on Sun, Tue, Thurs, Sat daily at 4:30 pm   Yes [provider]    Family History Family History  Problem Relation Age of Onset  . Lung cancer Sister   . Stroke Mother   . Stroke Father     Social History Social History  Substance Use Topics  . Smoking status: Never Smoker  . Smokeless tobacco: Never Used  . Alcohol use No  lives in a NH   Allergies   Patient has no known allergies.   Review of Systems Review of Systems  Unable to perform ROS: Dementia  Constitutional: Negative for chills and fever.  Respiratory: Positive for cough and shortness of breath.   Cardiovascular: Negative for leg swelling.     Physical Exam Updated Vital Signs BP (!) 157/82 (BP Location: Right Arm)   Pulse 85   Temp 100.1 F (37.8 C) (Rectal)   Resp (!) 21   Ht 5\' 2"  (1.575 m)   Wt 175 lb (79.4 kg)   SpO2 96%   BMI 32.01 kg/m   ,Vital signs normal except for hypertension and low-grade fever   Physical Exam  Constitutional: She  appears well-developed and well-nourished.  Non-toxic appearance. She does not appear ill. No distress.  Follows some commands slowly.   HENT:  Head: Normocephalic and atraumatic.  Right Ear: External ear normal.  Left Ear: External ear normal.  Nose: Nose normal. No mucosal edema or rhinorrhea.  Mouth/Throat: Mucous membranes are normal. No dental abscesses or uvula swelling.  Mucous membranes are dry.   Eyes:  Does not open her eyes to command.  Neck: Normal range of motion and full passive range of motion without pain. Neck supple.  Cardiovascular: Normal rate, regular rhythm and normal heart sounds.  Exam reveals no gallop and no friction rub.   No murmur heard. Pulmonary/Chest: Effort normal and breath sounds normal. No respiratory distress. She has no wheezes. She has no rhonchi. She has no rales. She exhibits no tenderness and no crepitus.  Coughing frequently with wet sounding mucous.   Abdominal: Soft. Normal appearance and bowel sounds are normal. She exhibits no distension. There is no tenderness. There is no rebound and no guarding.  Musculoskeletal: Normal range of motion. She exhibits no edema or tenderness.  Moves all extremities well.   Neurological: She has normal strength. No cranial nerve deficit.  Resting with eyes closed.   Skin: Skin is warm, dry and intact. No rash noted. No erythema. No pallor.  Psychiatric: Her speech is normal. Her mood appears not anxious.  Nursing note and vitals reviewed.    ED Treatments / Results   Results for orders placed or performed during the hospital encounter of 08/21/16  CBC  Result Value Ref Range   WBC 17.6 (H) 4.0 - 10.5 K/uL   RBC 4.23 3.87 - 5.11 MIL/uL   Hemoglobin 10.6 (L) 12.0 - 15.0 g/dL   HCT 16.133.6 (L) 09.636.0 - 04.546.0 %   MCV 79.4 78.0 - 100.0 fL   MCH 25.1 (L) 26.0 - 34.0 pg   MCHC 31.5 30.0 - 36.0 g/dL   RDW 40.916.8 (H) 81.111.5 - 91.415.5 %   Platelets 490 (H) 150 - 400 K/uL  Urinalysis, Routine w reflex microscopic    Result Value Ref Range   Color, Urine YELLOW YELLOW   APPearance TURBID (A) CLEAR   Specific Gravity, Urine 1.013 1.005 - 1.030   pH 5.0 5.0 - 8.0  Glucose, UA NEGATIVE NEGATIVE mg/dL   Hgb urine dipstick SMALL (A) NEGATIVE   Bilirubin Urine NEGATIVE NEGATIVE   Ketones, ur NEGATIVE NEGATIVE mg/dL   Protein, ur NEGATIVE NEGATIVE mg/dL   Nitrite POSITIVE (A) NEGATIVE   Leukocytes, UA LARGE (A) NEGATIVE   RBC / HPF 6-30 0 - 5 RBC/hpf   WBC, UA TOO NUMEROUS TO COUNT 0 - 5 WBC/hpf   Bacteria, UA MANY (A) NONE SEEN   Squamous Epithelial / LPF 0-5 (A) NONE SEEN   WBC Clumps PRESENT    Mucous PRESENT    Non Squamous Epithelial 0-5 (A) NONE SEEN  Comprehensive metabolic panel  Result Value Ref Range   Sodium 138 135 - 145 mmol/L   Potassium 3.5 3.5 - 5.1 mmol/L   Chloride 99 (L) 101 - 111 mmol/L   CO2 25 22 - 32 mmol/L   Glucose, Bld 123 (H) 65 - 99 mg/dL   BUN 37 (H) 6 - 20 mg/dL   Creatinine, Ser 1.61 (H) 0.44 - 1.00 mg/dL   Calcium 9.4 8.9 - 09.6 mg/dL   Total Protein 8.6 (H) 6.5 - 8.1 g/dL   Albumin 3.2 (L) 3.5 - 5.0 g/dL   AST 26 15 - 41 U/L   ALT 23 14 - 54 U/L   Alkaline Phosphatase 126 38 - 126 U/L   Total Bilirubin 0.4 0.3 - 1.2 mg/dL   GFR calc non Af Amer 40 (L) >60 mL/min   GFR calc Af Amer 46 (L) >60 mL/min   Anion gap 14 5 - 15  Protime-INR  Result Value Ref Range   Prothrombin Time 72.4 (H) 11.4 - 15.2 seconds   INR 8.39 (HH)   Troponin I  Result Value Ref Range   Troponin I <0.03 <0.03 ng/mL  Brain natriuretic peptide  Result Value Ref Range   B Natriuretic Peptide 134.9 (H) 0.0 - 100.0 pg/mL  I-Stat CG4 Lactic Acid, ED  Result Value Ref Range   Lactic Acid, Venous 2.46 (HH) 0.5 - 1.9 mmol/L   Comment NOTIFIED PHYSICIAN    Laboratory interpretation all normal except for coagulopathy due to warfarin, leukocytosis, lactic acidosis, insignificant elevation of BNP. Leukocytosis, mild anemia, urinary tract infection, acute renal insufficiency consistent with  dehydration   EKG  EKG Interpretation  Date/Time:  Sunday Aug 21 2016 23:15:00 EDT Ventricular Rate:  83 PR Interval:    QRS Duration: 95 QT Interval:  389 QTC Calculation: 458 R Axis:   -30 Text Interpretation:  Sinus rhythm Left axis deviation Abnormal R-wave progression, early transition Borderline repolarization abnormality No significant change since last tracing 07 May 2015 Confirmed by [facility]  MD-I, Gelsey Amyx ([id number]) on 08/21/2016 11:31:09 PM       Radiology Ct Head Wo Contrast  Result Date: 08/22/2016 CLINICAL DATA:  Acute onset of altered mental status. Elevated INR. Initial encounter. EXAM: CT HEAD WITHOUT CONTRAST TECHNIQUE: Contiguous axial images were obtained from the base of the skull through the vertex without intravenous contrast. COMPARISON:  CT of the head performed 05/08/2015 FINDINGS: Brain: No evidence of acute infarction, hemorrhage, hydrocephalus, extra-axial collection or mass lesion/mass effect. Prominence of the ventricles and sulci reflects moderate cortical volume loss. Cerebellar atrophy is noted. Mild periventricular white matter change likely reflects small vessel ischemic microangiopathy. The brainstem and fourth ventricle are within normal limits. The basal ganglia are unremarkable in appearance. The cerebral hemispheres demonstrate grossly normal gray-white differentiation. No mass effect or midline shift is seen. Vascular: No hyperdense vessel or unexpected calcification. Skull:  There is no evidence of fracture; visualized osseous structures are unremarkable in appearance. Sinuses/Orbits: The orbits are within normal limits. There is opacification of the right side of the sphenoid sinus. The remaining paranasal sinuses and mastoid air cells are well-aerated. Other: No significant soft tissue abnormalities are seen. IMPRESSION: 1. No acute intracranial pathology seen on CT. 2. Moderate cortical volume loss and scattered small vessel ischemic microangiopathy.  Electronically Signed   By: Roanna Raider M.D.   On: 08/22/2016 02:00   Dg Chest Port 1 View  Result Date: 08/22/2016 CLINICAL DATA:  Weakness, cough, shortness of breath, fever. Congestion. Hypertension. History of CHF and dementia. Recent incontinence EXAM: PORTABLE CHEST 1 VIEW COMPARISON:  05/08/2015 FINDINGS: Shallow inspiration with linear atelectasis in the lung bases. Mild cardiac enlargement. No vascular congestion or edema. No blunting of costophrenic angles. No pneumothorax. Tortuous aorta. Degenerative changes in the spine. IMPRESSION: Shallow inspiration with linear atelectasis in the lung bases. Cardiac enlargement. Electronically Signed   By: Burman Nieves M.D.   On: 08/22/2016 00:08    Procedures Procedures (including critical care time)  Medications Ordered in ED Medications  cefTRIAXone (ROCEPHIN) 2 g in dextrose 5 % 50 mL IVPB (0 g Intravenous Stopped 08/22/16 0226)     Initial Impression / Assessment and Plan / ED Course  I have reviewed the triage vital signs and the nursing notes.  Pertinent labs & imaging results that were available during my care of the patient were reviewed by me and considered in my medical decision making (see chart for details).     DIAGNOSTIC STUDIES: Oxygen Saturation is 96% on RA, normal by my interpretation.    COORDINATION OF CARE: 11:55 PM Discussed treatment plan with pt at bedside and pt agreed to plan, which includes lab work, rectal temp, UA and portable CXR.   After reviewing patient's laboratory results a head CT was done. Patient has altered mental status which may be from her UTI fever however she does have a significant elevation of her INR. CT of the head was done to rule out acute intracranial bleed.  She was started on IV Rocephin for her urinary tract infection. Zithromax was added for possible pneumonia seen on her chest x-ray.   1:09 AM Discussed the patients lab results with the patient and daughter bedside.  Agreeable with admission  02:37 AM Dr Julian Reil, hospitalist will admit.     Final Clinical Impressions(s) / ED Diagnoses   Final diagnoses:  Altered mental status, unspecified altered mental status type  Urinary tract infection without hematuria, site unspecified  Warfarin-induced coagulopathy (HCC)  Community acquired pneumonia, unspecified laterality    Plan admission  Devoria Albe, MD, FACEP  I personally performed the services described in this documentation, which was scribed in my presence. The recorded information has been reviewed and considered.  Devoria Albe, MD, Concha Pyo, MD 08/22/16 [phone]

## 2016-08-21 NOTE — ED Notes (Signed)
Bed: [id number] Expected date:  Expected time:  Means of arrival:  Comments: 83 yo F/ Shortness of breath

## 2016-08-22 ENCOUNTER — Emergency Department (HOSPITAL_COMMUNITY): Payer: Medicare Other

## 2016-08-22 DIAGNOSIS — Z993 Dependence on wheelchair: Secondary | ICD-10-CM | POA: Diagnosis not present

## 2016-08-22 DIAGNOSIS — D509 Iron deficiency anemia, unspecified: Secondary | ICD-10-CM | POA: Diagnosis present

## 2016-08-22 DIAGNOSIS — F419 Anxiety disorder, unspecified: Secondary | ICD-10-CM | POA: Diagnosis not present

## 2016-08-22 DIAGNOSIS — Z96651 Presence of right artificial knee joint: Secondary | ICD-10-CM | POA: Diagnosis present

## 2016-08-22 DIAGNOSIS — L299 Pruritus, unspecified: Secondary | ICD-10-CM | POA: Diagnosis not present

## 2016-08-22 DIAGNOSIS — F0280 Dementia in other diseases classified elsewhere without behavioral disturbance: Secondary | ICD-10-CM | POA: Diagnosis not present

## 2016-08-22 DIAGNOSIS — R1312 Dysphagia, oropharyngeal phase: Secondary | ICD-10-CM | POA: Diagnosis present

## 2016-08-22 DIAGNOSIS — I5033 Acute on chronic diastolic (congestive) heart failure: Secondary | ICD-10-CM | POA: Diagnosis not present

## 2016-08-22 DIAGNOSIS — K5900 Constipation, unspecified: Secondary | ICD-10-CM | POA: Diagnosis not present

## 2016-08-22 DIAGNOSIS — N390 Urinary tract infection, site not specified: Secondary | ICD-10-CM | POA: Diagnosis not present

## 2016-08-22 DIAGNOSIS — Z86711 Personal history of pulmonary embolism: Secondary | ICD-10-CM | POA: Diagnosis not present

## 2016-08-22 DIAGNOSIS — K648 Other hemorrhoids: Secondary | ICD-10-CM | POA: Diagnosis not present

## 2016-08-22 DIAGNOSIS — Z8673 Personal history of transient ischemic attack (TIA), and cerebral infarction without residual deficits: Secondary | ICD-10-CM | POA: Diagnosis not present

## 2016-08-22 DIAGNOSIS — R791 Abnormal coagulation profile: Secondary | ICD-10-CM | POA: Diagnosis not present

## 2016-08-22 DIAGNOSIS — I110 Hypertensive heart disease with heart failure: Secondary | ICD-10-CM | POA: Diagnosis present

## 2016-08-22 DIAGNOSIS — E876 Hypokalemia: Secondary | ICD-10-CM | POA: Diagnosis not present

## 2016-08-22 DIAGNOSIS — K210 Gastro-esophageal reflux disease with esophagitis: Secondary | ICD-10-CM | POA: Diagnosis not present

## 2016-08-22 DIAGNOSIS — I10 Essential (primary) hypertension: Secondary | ICD-10-CM | POA: Diagnosis not present

## 2016-08-22 DIAGNOSIS — H409 Unspecified glaucoma: Secondary | ICD-10-CM | POA: Diagnosis not present

## 2016-08-22 DIAGNOSIS — F0390 Unspecified dementia without behavioral disturbance: Secondary | ICD-10-CM | POA: Diagnosis present

## 2016-08-22 DIAGNOSIS — J9811 Atelectasis: Secondary | ICD-10-CM | POA: Diagnosis not present

## 2016-08-22 DIAGNOSIS — K222 Esophageal obstruction: Secondary | ICD-10-CM | POA: Diagnosis not present

## 2016-08-22 DIAGNOSIS — I5032 Chronic diastolic (congestive) heart failure: Secondary | ICD-10-CM | POA: Diagnosis not present

## 2016-08-22 DIAGNOSIS — E785 Hyperlipidemia, unspecified: Secondary | ICD-10-CM | POA: Diagnosis present

## 2016-08-22 DIAGNOSIS — R0602 Shortness of breath: Secondary | ICD-10-CM | POA: Diagnosis not present

## 2016-08-22 DIAGNOSIS — Z79899 Other long term (current) drug therapy: Secondary | ICD-10-CM | POA: Diagnosis not present

## 2016-08-22 DIAGNOSIS — I517 Cardiomegaly: Secondary | ICD-10-CM | POA: Diagnosis not present

## 2016-08-22 DIAGNOSIS — D689 Coagulation defect, unspecified: Secondary | ICD-10-CM | POA: Diagnosis present

## 2016-08-22 DIAGNOSIS — R05 Cough: Secondary | ICD-10-CM | POA: Diagnosis not present

## 2016-08-22 DIAGNOSIS — J9600 Acute respiratory failure, unspecified whether with hypoxia or hypercapnia: Secondary | ICD-10-CM | POA: Diagnosis not present

## 2016-08-22 DIAGNOSIS — N179 Acute kidney failure, unspecified: Secondary | ICD-10-CM

## 2016-08-22 DIAGNOSIS — Z7901 Long term (current) use of anticoagulants: Secondary | ICD-10-CM | POA: Diagnosis not present

## 2016-08-22 DIAGNOSIS — K449 Diaphragmatic hernia without obstruction or gangrene: Secondary | ICD-10-CM | POA: Diagnosis present

## 2016-08-22 DIAGNOSIS — I5031 Acute diastolic (congestive) heart failure: Secondary | ICD-10-CM | POA: Diagnosis not present

## 2016-08-22 DIAGNOSIS — R4182 Altered mental status, unspecified: Secondary | ICD-10-CM | POA: Diagnosis not present

## 2016-08-22 DIAGNOSIS — E559 Vitamin D deficiency, unspecified: Secondary | ICD-10-CM | POA: Diagnosis not present

## 2016-08-22 DIAGNOSIS — J189 Pneumonia, unspecified organism: Secondary | ICD-10-CM | POA: Diagnosis present

## 2016-08-22 DIAGNOSIS — Z66 Do not resuscitate: Secondary | ICD-10-CM | POA: Diagnosis present

## 2016-08-22 DIAGNOSIS — F331 Major depressive disorder, recurrent, moderate: Secondary | ICD-10-CM | POA: Diagnosis present

## 2016-08-22 DIAGNOSIS — K5730 Diverticulosis of large intestine without perforation or abscess without bleeding: Secondary | ICD-10-CM | POA: Diagnosis not present

## 2016-08-22 DIAGNOSIS — J329 Chronic sinusitis, unspecified: Secondary | ICD-10-CM | POA: Diagnosis not present

## 2016-08-22 DIAGNOSIS — B9620 Unspecified Escherichia coli [E. coli] as the cause of diseases classified elsewhere: Secondary | ICD-10-CM | POA: Diagnosis present

## 2016-08-22 DIAGNOSIS — F329 Major depressive disorder, single episode, unspecified: Secondary | ICD-10-CM | POA: Diagnosis not present

## 2016-08-22 DIAGNOSIS — Z9181 History of falling: Secondary | ICD-10-CM | POA: Diagnosis not present

## 2016-08-22 DIAGNOSIS — R933 Abnormal findings on diagnostic imaging of other parts of digestive tract: Secondary | ICD-10-CM | POA: Diagnosis not present

## 2016-08-22 DIAGNOSIS — J309 Allergic rhinitis, unspecified: Secondary | ICD-10-CM | POA: Diagnosis not present

## 2016-08-22 LAB — COMPREHENSIVE METABOLIC PANEL
ALT: 23 U/L (ref 14–54)
AST: 26 U/L (ref 15–41)
Albumin: 3.2 g/dL — ABNORMAL LOW (ref 3.5–5.0)
Alkaline Phosphatase: 126 U/L (ref 38–126)
Anion gap: 14 (ref 5–15)
BUN: 37 mg/dL — ABNORMAL HIGH (ref 6–20)
CHLORIDE: 99 mmol/L — AB (ref 101–111)
CO2: 25 mmol/L (ref 22–32)
CREATININE: 1.18 mg/dL — AB (ref 0.44–1.00)
Calcium: 9.4 mg/dL (ref 8.9–10.3)
GFR calc Af Amer: 46 mL/min — ABNORMAL LOW (ref >60)
GFR calc non Af Amer: 40 mL/min — ABNORMAL LOW (ref >60)
Glucose, Bld: 123 mg/dL — ABNORMAL HIGH (ref 65–99)
Potassium: 3.5 mmol/L (ref 3.5–5.1)
SODIUM: 138 mmol/L (ref 135–145)
Total Bilirubin: 0.4 mg/dL (ref 0.3–1.2)
Total Protein: 8.6 g/dL — ABNORMAL HIGH (ref 6.5–8.1)

## 2016-08-22 LAB — URINALYSIS, ROUTINE W REFLEX MICROSCOPIC
BILIRUBIN URINE: NEGATIVE
GLUCOSE, UA: NEGATIVE mg/dL
KETONES UR: NEGATIVE mg/dL
Nitrite: POSITIVE — AB
PROTEIN: NEGATIVE mg/dL
Specific Gravity, Urine: 1.013 (ref 1.005–1.030)
pH: 5 (ref 5.0–8.0)

## 2016-08-22 LAB — CBC
HCT: 33.6 % — ABNORMAL LOW (ref 36.0–46.0)
Hemoglobin: 10.6 g/dL — ABNORMAL LOW (ref 12.0–15.0)
MCH: 25.1 pg — AB (ref 26.0–34.0)
MCHC: 31.5 g/dL (ref 30.0–36.0)
MCV: 79.4 fL (ref 78.0–100.0)
PLATELETS: 490 10*3/uL — AB (ref 150–400)
RBC: 4.23 MIL/uL (ref 3.87–5.11)
RDW: 16.8 % — ABNORMAL HIGH (ref 11.5–15.5)
WBC: 17.6 10*3/uL — ABNORMAL HIGH (ref 4.0–10.5)

## 2016-08-22 LAB — MRSA PCR SCREENING: MRSA by PCR: NEGATIVE

## 2016-08-22 LAB — STREP PNEUMONIAE URINARY ANTIGEN: Strep Pneumo Urinary Antigen: NEGATIVE

## 2016-08-22 LAB — PROTIME-INR
INR: 8.39
Prothrombin Time: 72.4 seconds — ABNORMAL HIGH (ref 11.4–15.2)

## 2016-08-22 LAB — HIV ANTIBODY (ROUTINE TESTING W REFLEX): HIV Screen 4th Generation wRfx: NONREACTIVE

## 2016-08-22 LAB — INFLUENZA PANEL BY PCR (TYPE A & B)
INFLBPCR: NEGATIVE
Influenza A By PCR: NEGATIVE

## 2016-08-22 LAB — I-STAT CG4 LACTIC ACID, ED: LACTIC ACID, VENOUS: 2.46 mmol/L — AB (ref 0.5–1.9)

## 2016-08-22 LAB — TROPONIN I: Troponin I: <0.03 ng/mL (ref <0.03)

## 2016-08-22 LAB — BRAIN NATRIURETIC PEPTIDE: B Natriuretic Peptide: 134.9 pg/mL — ABNORMAL HIGH (ref 0.0–100.0)

## 2016-08-22 MED ORDER — ACETAMINOPHEN 500 MG PO TABS
1000.0000 mg | ORAL_TABLET | Freq: Three times a day (TID) | ORAL | Status: DC
  Administered 2016-08-22 – 2016-08-28 (×18): 1000 mg via ORAL
  Filled 2016-08-22 (×19): qty 2

## 2016-08-22 MED ORDER — VITAMIN D3 25 MCG (1000 UNIT) PO TABS
1000.0000 [IU] | ORAL_TABLET | Freq: Every day | ORAL | Status: DC
  Administered 2016-08-22 – 2016-08-26 (×6): 1000 [IU] via ORAL
  Filled 2016-08-22 (×6): qty 1

## 2016-08-22 MED ORDER — FLUTICASONE FUROATE-VILANTEROL 200-25 MCG/INH IN AEPB
1.0000 | INHALATION_SPRAY | Freq: Every day | RESPIRATORY_TRACT | Status: DC
  Administered 2016-08-22 – 2016-08-28 (×4): 1 via RESPIRATORY_TRACT
  Filled 2016-08-22 (×2): qty 28

## 2016-08-22 MED ORDER — MEMANTINE HCL 10 MG PO TABS
10.0000 mg | ORAL_TABLET | Freq: Two times a day (BID) | ORAL | Status: DC
  Administered 2016-08-22 – 2016-08-28 (×13): 10 mg via ORAL
  Filled 2016-08-22 (×4): qty 1
  Filled 2016-08-22: qty 2
  Filled 2016-08-22 (×9): qty 1

## 2016-08-22 MED ORDER — TOBRAMYCIN 0.3 % OP SOLN
1.0000 [drp] | Freq: Four times a day (QID) | OPHTHALMIC | Status: DC
Start: 2016-08-22 — End: 2016-08-28
  Administered 2016-08-22: 2 [drp] via OPHTHALMIC
  Administered 2016-08-22: 1 [drp] via OPHTHALMIC
  Administered 2016-08-22: 2 [drp] via OPHTHALMIC
  Administered 2016-08-22 – 2016-08-24 (×5): 1 [drp] via OPHTHALMIC
  Administered 2016-08-24: 2 [drp] via OPHTHALMIC
  Administered 2016-08-25 (×2): 1 [drp] via OPHTHALMIC
  Administered 2016-08-25: 2 [drp] via OPHTHALMIC
  Administered 2016-08-26 – 2016-08-27 (×5): 1 [drp] via OPHTHALMIC
  Administered 2016-08-27 (×2): 2 [drp] via OPHTHALMIC
  Administered 2016-08-28 (×2): 1 [drp] via OPHTHALMIC
  Filled 2016-08-22: qty 5

## 2016-08-22 MED ORDER — ALBUTEROL SULFATE (2.5 MG/3ML) 0.083% IN NEBU
2.5000 mg | INHALATION_SOLUTION | RESPIRATORY_TRACT | Status: DC | PRN
  Administered 2016-08-23: 2.5 mg via RESPIRATORY_TRACT

## 2016-08-22 MED ORDER — SENNOSIDES-DOCUSATE SODIUM 8.6-50 MG PO TABS: 2.0000 | ORAL_TABLET | Freq: Every day | ORAL | Status: DC | PRN

## 2016-08-22 MED ORDER — GUAIFENESIN-DM 100-10 MG/5ML PO SYRP
15.0000 mL | ORAL_SOLUTION | ORAL | Status: DC | PRN
  Administered 2016-08-22 – 2016-08-27 (×8): 15 mL via ORAL
  Filled 2016-08-22 (×8): qty 20

## 2016-08-22 MED ORDER — MIRTAZAPINE 15 MG PO TABS
15.0000 mg | ORAL_TABLET | Freq: Every day | ORAL | Status: DC
  Administered 2016-08-22 – 2016-08-27 (×6): 15 mg via ORAL
  Filled 2016-08-22 (×6): qty 1

## 2016-08-22 MED ORDER — SULFACETAMIDE SODIUM 10 % OP SOLN
1.0000 [drp] | Freq: Four times a day (QID) | OPHTHALMIC | Status: DC
  Administered 2016-08-22 – 2016-08-28 (×21): 1 [drp] via OPHTHALMIC
  Filled 2016-08-22: qty 15

## 2016-08-22 MED ORDER — LATANOPROST 0.005 % OP SOLN
1.0000 [drp] | Freq: Every day | OPHTHALMIC | Status: DC
  Administered 2016-08-22 – 2016-08-27 (×6): 1 [drp] via OPHTHALMIC
  Filled 2016-08-22: qty 2.5

## 2016-08-22 MED ORDER — CALCIUM CARBONATE ANTACID 500 MG PO CHEW: 2.0000 | CHEWABLE_TABLET | ORAL | Status: DC | PRN

## 2016-08-22 MED ORDER — CEFTRIAXONE SODIUM 2 G IJ SOLR
2.0000 g | Freq: Once | INTRAMUSCULAR | Status: AC
  Administered 2016-08-22: 2 g via INTRAVENOUS
  Filled 2016-08-22: qty 2

## 2016-08-22 MED ORDER — SODIUM CHLORIDE 0.9 % IV SOLN
INTRAVENOUS | Status: DC
  Administered 2016-08-22 (×3): via INTRAVENOUS

## 2016-08-22 MED ORDER — METOPROLOL TARTRATE 50 MG PO TABS
100.0000 mg | ORAL_TABLET | Freq: Two times a day (BID) | ORAL | Status: DC
  Administered 2016-08-22 – 2016-08-28 (×13): 100 mg via ORAL
  Filled 2016-08-22 (×3): qty 2
  Filled 2016-08-22: qty 4
  Filled 2016-08-22: qty 2
  Filled 2016-08-22: qty 4
  Filled 2016-08-22 (×3): qty 2
  Filled 2016-08-22: qty 4
  Filled 2016-08-22: qty 2
  Filled 2016-08-22: qty 4
  Filled 2016-08-22 (×2): qty 2

## 2016-08-22 MED ORDER — DULOXETINE HCL 60 MG PO CPEP
60.0000 mg | ORAL_CAPSULE | Freq: Every day | ORAL | Status: DC
  Administered 2016-08-22 – 2016-08-28 (×7): 60 mg via ORAL
  Filled 2016-08-22 (×2): qty 2
  Filled 2016-08-22 (×5): qty 1

## 2016-08-22 MED ORDER — AZITHROMYCIN 250 MG PO TABS
500.0000 mg | ORAL_TABLET | Freq: Every day | ORAL | Status: AC
  Administered 2016-08-22: 500 mg via ORAL
  Filled 2016-08-22: qty 2

## 2016-08-22 MED ORDER — AZITHROMYCIN 250 MG PO TABS
250.0000 mg | ORAL_TABLET | Freq: Every day | ORAL | Status: DC
  Administered 2016-08-23 – 2016-08-24 (×2): 250 mg via ORAL
  Filled 2016-08-22 (×2): qty 1

## 2016-08-22 MED ORDER — DEXTROSE 5 % IV SOLN
1.0000 g | INTRAVENOUS | Status: DC
  Administered 2016-08-22 – 2016-08-24 (×3): 1 g via INTRAVENOUS
  Filled 2016-08-22 (×4): qty 10

## 2016-08-22 MED ORDER — FAMOTIDINE 20 MG PO TABS
20.0000 mg | ORAL_TABLET | Freq: Two times a day (BID) | ORAL | Status: DC
  Administered 2016-08-22 – 2016-08-26 (×11): 20 mg via ORAL
  Filled 2016-08-22 (×11): qty 1

## 2016-08-22 NOTE — ED Notes (Signed)
Patient soiled in urine. Patient cleaned up.

## 2016-08-22 NOTE — Progress Notes (Signed)
Patient seen and examined at bedside, patient admitted after midnight, please see earlier detailed admission note by Hillary BowJared M Gardner, DO. Briefly, patient presented with generalized weakness found to have a URI concerning for pneumonia and UTI. She is being treated with antibiotics. Continue current management. INR significant elevated with no bleeding or bruising. Will continue to trend INR and hold Coumadin   Jacquelin Hawkingalph Luciel Brickman, MD [facility] 08/22/2016, 9:54 AM Pager: [phone]

## 2016-08-22 NOTE — H&P (Signed)
History and Physical    Kristin Wilkerson ZOX:[id number] DOB: 11/01/1925 DOA: 08/21/2016  PCP: Kimber Relic, MD  Patient coming from: SNF  I have personally briefly reviewed patient's old medical records in [facility]  Chief Complaint: Generalized weakness  HPI: Kristin Wilkerson is a 83 y.o. female with medical history significant of Dementia, HTN.  Patient brought in by daughter from SNF with increased cough, URI, generalized weakness.  Symptoms onset about 1 week ago.  Has been persistent.  Just about every other resident of the SNF has been ill with similar symptoms.  Nothing makes better or worse.  CXRs at SNF negative.  They increased her lasix which hasnt helped and Dr. Allyson Sabal told daughter that he didn't think it was CHF anyhow when he saw her on Friday.   ED Course: WBC 17k.  Patient with new AKI, CXR neg, BNP 120, UA shows UTI.   Review of Systems: As per HPI otherwise 10 point review of systems negative.   Past Medical History:  Diagnosis Date  . Anemia   . Anxiety   . CHF (congestive heart failure) (HCC)   . Dementia   . Hyperlipidemia   . Hypertension     Past Surgical History:  Procedure Laterality Date  . ABDOMINAL HYSTERECTOMY    . BILATERAL OOPHORECTOMY    . CARDIOVASCULAR STRESS TEST  11/16/2009   Perfusion defect seen in inferior myocardial region consistent with diaphragmatic attenuation. Remaining myocardium demonstrates normalmyocardial perfusion with no evidence of ischemia or infarct. No ECG changes. EKG negative for ischemia.  . CHOLECYSTECTOMY    . I&D KNEE WITH POLY EXCHANGE Right 04/21/2015   Procedure: IRRIGATION AND DEBRIDEMENT RIGHT KNEE WITH PATELLECTOMY AND REPAIR OF COMPLEX OPEN WOUND;  Surgeon: Ranee Gosselin, MD;  Location: [facility];  Service: Orthopedics;  Laterality: Right;  . I&D RIGHT KNEE WITH PATELLECTOMY  04/21/2015  . LUMBAR LAMINECTOMY  12/04/2012   Complete decompressive L3-4 and L4-5--Dr. Darrelyn Hillock  . TOTAL KNEE ARTHROPLASTY Right 02/11/2015    Procedure: TOTAL KNEE ARTHROPLASTY;  Surgeon: Ranee Gosselin, MD;  Location: [facility];  Service: Orthopedics;  Laterality: Right;  . TRANSTHORACIC ECHOCARDIOGRAM  09/01/2011   EF >55% and moderate tricuspid valve regurg.     reports that she has never smoked. She has never used smokeless tobacco. She reports that she does not drink alcohol or use drugs.  No Known Allergies  Family History  Problem Relation Age of Onset  . Lung cancer Sister   . Stroke Mother   . Stroke Father      Prior to Admission medications   Medication Sig Start Date End Date Taking? Authorizing Provider  acetaminophen (TYLENOL) 500 MG tablet Take 1,000 mg by mouth 3 (three) times daily.   Yes [provider]  albuterol (PROVENTIL) (2.5 MG/3ML) 0.083% nebulizer solution Take 2.5 mg by nebulization every 4 (four) hours as needed for wheezing or shortness of breath.   Yes [provider]  calcium carbonate (TUMS - DOSED IN MG ELEMENTAL CALCIUM) 500 MG chewable tablet Chew 2 tablets by mouth every 4 (four) hours as needed for indigestion or heartburn.   Yes [provider]  cholecalciferol (VITAMIN D) 1000 UNITS tablet Take 1,000 Units by mouth daily at 8 pm.    Yes [provider]  DULoxetine (CYMBALTA) 60 MG capsule Take 1 capsule (60 mg total) by mouth daily. One daily to help nerves 05/01/15  Yes Levert Feinstein, MD  fluticasone-salmeterol (ADVAIR [facility]) [phone] MCG/ACT inhaler Inhale  2 puffs into the lungs 2 (two) times daily.   Yes [provider]  furosemide (LASIX) 20 MG tablet Take 1 tablet (20 mg total) by mouth every other day. Patient taking differently: Take 40 mg by mouth 2 (two) times daily.  04/24/15  Yes Constable, Amber, PA-C  guaiFENesin-dextromethorphan (ROBITUSSIN DM) 100-10 MG/5ML syrup Take 15 mLs by mouth every 4 (four) hours as needed for cough.   Yes [provider]  latanoprost (XALATAN) 0.005 % ophthalmic solution PLACE 1 DROP IN BOTH EYES AT BEDTIME  TO TREAT GLAUCOMA 02/23/15  Yes Kimber Relic, MD  losartan (COZAAR) 100 MG tablet TAKE 1 TABLET BY MOUTH ONCE DAILY FOR BLOOD PRESSURE Patient taking differently: TAKE 50 MG BY MOUTH ONCE DAILY FOR BLOOD PRESSURE 11/17/14  Yes Kimber Relic, MD  meclizine (ANTIVERT) 12.5 MG tablet Take 12.5 mg by mouth 3 (three) times daily as needed for nausea.    Yes [provider]  memantine (NAMENDA TITRATION PAK) tablet pack 5 mg/day for =1 week; 5 mg twice daily for =1 week; 15 mg/day given in 5 mg and 10 mg separated doses for =1 week; then 10 mg twice daily Patient taking differently: Take 10 mg by mouth 2 (two) times daily.  07/30/15  Yes Butch Penny, NP  metoprolol (LOPRESSOR) 100 MG tablet TAKE 1 TABLET BY MOUTH TWICE DAILY TO CONTORL BLOOD PRESSURE Patient taking differently: TAKE 100 MG BY MOUTH TWICE DAILY TO CONTORL BLOOD PRESSURE 06/23/14  Yes Kimber Relic, MD  mirtazapine (REMERON) 15 MG tablet Take 15 mg by mouth at bedtime.   Yes [provider]  ranitidine (ZANTAC) 150 MG tablet Take 150 mg by mouth 2 (two) times daily.   Yes [provider]  sennosides-docusate sodium (SENOKOT-S) 8.6-50 MG tablet Take 2 tablets by mouth daily as needed for constipation.    Yes [provider]  sulfacetamide (BLEPH-10) 10 % ophthalmic solution Place 1 drop into the right eye 4 (four) times daily.   Yes [provider]  tobramycin (TOBREX) 0.3 % ophthalmic solution Place 1-2 drops into both eyes 4 (four) times daily.   Yes [provider]  warfarin (COUMADIN) 1 MG tablet Take 1-2 mg by mouth See admin instructions. Taking 1 mg on Mon, Wed, Fri  -- Taking 2 mg on Sun, Tue, Thurs, Sat daily at 4:30 pm   Yes [provider]    Physical Exam: Vitals:   08/22/16 0044 08/22/16 0044 08/22/16 0250 08/22/16 0251  BP:  (!) 157/82 129/88 (!) 174/77  Pulse:  85 88 93  Resp:  (!) 21 20 18   Temp:  100.1 F (37.8 C)    TempSrc:  Rectal    SpO2:  96%  96% 98%  Weight: 79.4 kg (175 lb)     Height: 5\' 2"  (1.575 m)       Constitutional: NAD, calm, comfortable Eyes: PERRL, lids and conjunctivae normal ENMT: Mucous membranes are moist. Posterior pharynx clear of any exudate or lesions.Normal dentition.  Neck: normal, supple, no masses, no thyromegaly Respiratory: Wet sounding cough. Cardiovascular: Regular rate and rhythm, no murmurs / rubs / gallops. No extremity edema. 2+ pedal pulses. No carotid bruits.  Abdomen: no tenderness, no masses palpated. No hepatosplenomegaly. Bowel sounds positive.  Musculoskeletal: no clubbing / cyanosis. No joint deformity upper and lower extremities. Good ROM, no contractures. Normal muscle tone.  Skin: no rashes, lesions, ulcers. No induration Neurologic: CN 2-12 grossly intact. Sensation intact, DTR normal. Strength 5/5 in  all 4.  Psychiatric: Normal judgment and insight. Alert and oriented x 3. Normal mood.    Labs on Admission: I have personally reviewed following labs and imaging studies  CBC:  Recent Labs Lab 08/22/16 0020  WBC 17.6*  HGB 10.6*  HCT 33.6*  MCV 79.4  PLT 490*   Basic Metabolic Panel:  Recent Labs Lab 08/22/16 0020  NA 138  K 3.5  CL 99*  CO2 25  GLUCOSE 123*  BUN 37*  CREATININE 1.18*  CALCIUM 9.4   GFR: Estimated Creatinine Clearance: 32.2 mL/min (A) (by C-G formula based on SCr of 1.18 mg/dL (H)). Liver Function Tests:  Recent Labs Lab 08/22/16 0020  AST 26  ALT 23  ALKPHOS 126  BILITOT 0.4  PROT 8.6*  ALBUMIN 3.2*   No results for input(s): LIPASE, AMYLASE in the last 168 hours. No results for input(s): AMMONIA in the last 168 hours. Coagulation Profile:  Recent Labs Lab 08/22/16 0020  INR 8.39*   Cardiac Enzymes:  Recent Labs Lab 08/22/16 0020  TROPONINI <0.03   BNP (last 3 results) No results for input(s): PROBNP in the last 8760 hours. HbA1C: No results for input(s): HGBA1C in the last 72 hours. CBG: No results for input(s):  GLUCAP in the last 168 hours. Lipid Profile: No results for input(s): CHOL, HDL, LDLCALC, TRIG, CHOLHDL, LDLDIRECT in the last 72 hours. Thyroid Function Tests: No results for input(s): TSH, T4TOTAL, FREET4, T3FREE, THYROIDAB in the last 72 hours. Anemia Panel: No results for input(s): VITAMINB12, FOLATE, FERRITIN, TIBC, IRON, RETICCTPCT in the last 72 hours. Urine analysis:    Component Value Date/Time   COLORURINE YELLOW 08/22/2016 0020   APPEARANCEUR TURBID (A) 08/22/2016 0020   LABSPEC 1.013 08/22/2016 0020   PHURINE 5.0 08/22/2016 0020   GLUCOSEU NEGATIVE 08/22/2016 0020   HGBUR SMALL (A) 08/22/2016 0020   BILIRUBINUR NEGATIVE 08/22/2016 0020   KETONESUR NEGATIVE 08/22/2016 0020   PROTEINUR NEGATIVE 08/22/2016 0020   UROBILINOGEN 1.0 02/13/2015 1505   NITRITE POSITIVE (A) 08/22/2016 0020   LEUKOCYTESUR LARGE (A) 08/22/2016 0020    Radiological Exams on Admission: Ct Head Wo Contrast  Result Date: 08/22/2016 CLINICAL DATA:  Acute onset of altered mental status. Elevated INR. Initial encounter. EXAM: CT HEAD WITHOUT CONTRAST TECHNIQUE: Contiguous axial images were obtained from the base of the skull through the vertex without intravenous contrast. COMPARISON:  CT of the head performed 05/08/2015 FINDINGS: Brain: No evidence of acute infarction, hemorrhage, hydrocephalus, extra-axial collection or mass lesion/mass effect. Prominence of the ventricles and sulci reflects moderate cortical volume loss. Cerebellar atrophy is noted. Mild periventricular white matter change likely reflects small vessel ischemic microangiopathy. The brainstem and fourth ventricle are within normal limits. The basal ganglia are unremarkable in appearance. The cerebral hemispheres demonstrate grossly normal gray-white differentiation. No mass effect or midline shift is seen. Vascular: No hyperdense vessel or unexpected calcification. Skull: There is no evidence of fracture; visualized osseous structures are  unremarkable in appearance. Sinuses/Orbits: The orbits are within normal limits. There is opacification of the right side of the sphenoid sinus. The remaining paranasal sinuses and mastoid air cells are well-aerated. Other: No significant soft tissue abnormalities are seen. IMPRESSION: 1. No acute intracranial pathology seen on CT. 2. Moderate cortical volume loss and scattered small vessel ischemic microangiopathy. Electronically Signed   By: Roanna Raider M.D.   On: 08/22/2016 02:00   Dg Chest Port 1 View  Result Date: 08/22/2016 CLINICAL DATA:  Weakness, cough, shortness of breath, fever. Congestion.  Hypertension. History of CHF and dementia. Recent incontinence EXAM: PORTABLE CHEST 1 VIEW COMPARISON:  05/08/2015 FINDINGS: Shallow inspiration with linear atelectasis in the lung bases. Mild cardiac enlargement. No vascular congestion or edema. No blunting of costophrenic angles. No pneumothorax. Tortuous aorta. Degenerative changes in the spine. IMPRESSION: Shallow inspiration with linear atelectasis in the lung bases. Cardiac enlargement. Electronically Signed   By: Burman NievesWilliam  Stevens M.D.   On: 08/22/2016 00:08    EKG: Independently reviewed.  Assessment/Plan Principal Problem:   Acute lower UTI Active Problems:   AKI (acute kidney injury) (HCC)   CAP (community acquired pneumonia)   Elevated INR   UTI (urinary tract infection)    1. Acute lower UTI - 1. Rocephin 2. Cultures pending 2. AKI - likely due to dehydration from increased lasix dose at SNF 1. Hold lasix 2. Hydrate with IVF 3. Watch for signs of fluid overload with rehydration 4. Follow BMP 3. Elevated INR - 1. Hold coumadin 2. No evidence of bleed 3. CT head negative 4. CAP - 1. PNA pathway 2. Add azithromycin 3. Flu PCR pending 4. Suspect viral infection or atypical PNA given numerous sick contacts  DVT prophylaxis: SCDs - INR is 8.x Code Status: DNR Family Communication: Daughter at bedside Disposition Plan: Back  to SNF after admit Consults called: None Admission status: Admit to inpatient   Hillary BowGARDNER, JARED M. DO [facility] Pager [phone]  If 7AM-7PM, please contact day team taking care of patient [email or web address] Password [email or web address]  08/22/2016, 2:53 AM

## 2016-08-23 ENCOUNTER — Inpatient Hospital Stay (HOSPITAL_COMMUNITY): Payer: Medicare Other

## 2016-08-23 ENCOUNTER — Other Ambulatory Visit (HOSPITAL_COMMUNITY): Payer: Medicare Other

## 2016-08-23 DIAGNOSIS — R0602 Shortness of breath: Secondary | ICD-10-CM

## 2016-08-23 DIAGNOSIS — I5033 Acute on chronic diastolic (congestive) heart failure: Secondary | ICD-10-CM

## 2016-08-23 DIAGNOSIS — R4182 Altered mental status, unspecified: Secondary | ICD-10-CM

## 2016-08-23 LAB — BASIC METABOLIC PANEL
ANION GAP: 15 (ref 5–15)
BUN: 19 mg/dL (ref 6–20)
CALCIUM: 9.2 mg/dL (ref 8.9–10.3)
CO2: 25 mmol/L (ref 22–32)
CREATININE: 0.83 mg/dL (ref 0.44–1.00)
Chloride: 102 mmol/L (ref 101–111)
GFR calc Af Amer: >60 mL/min (ref >60)
GLUCOSE: 112 mg/dL — AB (ref 65–99)
Potassium: 3.3 mmol/L — ABNORMAL LOW (ref 3.5–5.1)
Sodium: 142 mmol/L (ref 135–145)

## 2016-08-23 LAB — PROTIME-INR
INR: 7.64
PROTHROMBIN TIME: 67.1 s — AB (ref 11.4–15.2)

## 2016-08-23 LAB — CBC
HCT: 33.4 % — ABNORMAL LOW (ref 36.0–46.0)
HEMOGLOBIN: 10.3 g/dL — AB (ref 12.0–15.0)
MCH: 25.1 pg — AB (ref 26.0–34.0)
MCHC: 30.8 g/dL (ref 30.0–36.0)
MCV: 81.3 fL (ref 78.0–100.0)
Platelets: 402 10*3/uL — ABNORMAL HIGH (ref 150–400)
RBC: 4.11 MIL/uL (ref 3.87–5.11)
RDW: 17.1 % — AB (ref 11.5–15.5)
WBC: 13.2 10*3/uL — ABNORMAL HIGH (ref 4.0–10.5)

## 2016-08-23 LAB — LACTIC ACID, PLASMA: LACTIC ACID, VENOUS: 1.4 mmol/L (ref 0.5–1.9)

## 2016-08-23 MED ORDER — FUROSEMIDE 10 MG/ML IJ SOLN
40.0000 mg | Freq: Two times a day (BID) | INTRAMUSCULAR | Status: DC
  Administered 2016-08-23: 40 mg via INTRAVENOUS
  Filled 2016-08-23: qty 4

## 2016-08-23 MED ORDER — FUROSEMIDE 10 MG/ML IJ SOLN: 40.0000 mg | Freq: Once | INTRAMUSCULAR | Status: DC

## 2016-08-23 MED ORDER — LOSARTAN POTASSIUM 50 MG PO TABS
50.0000 mg | ORAL_TABLET | Freq: Every day | ORAL | Status: DC
  Administered 2016-08-23 – 2016-08-28 (×6): 50 mg via ORAL
  Filled 2016-08-23 (×6): qty 1

## 2016-08-23 MED ORDER — FUROSEMIDE 10 MG/ML IJ SOLN
40.0000 mg | INTRAMUSCULAR | Status: AC
  Administered 2016-08-23: 40 mg via INTRAVENOUS

## 2016-08-23 MED ORDER — ALBUTEROL SULFATE (2.5 MG/3ML) 0.083% IN NEBU
2.5000 mg | INHALATION_SOLUTION | Freq: Three times a day (TID) | RESPIRATORY_TRACT | Status: DC
  Administered 2016-08-23 – 2016-08-24 (×5): 2.5 mg via RESPIRATORY_TRACT
  Filled 2016-08-23 (×5): qty 3

## 2016-08-23 MED ORDER — METRONIDAZOLE 500 MG PO TABS
500.0000 mg | ORAL_TABLET | Freq: Three times a day (TID) | ORAL | Status: DC
  Administered 2016-08-23 – 2016-08-24 (×4): 500 mg via ORAL
  Filled 2016-08-23 (×4): qty 1

## 2016-08-23 MED ORDER — FUROSEMIDE 10 MG/ML IJ SOLN
INTRAMUSCULAR | Status: AC
  Filled 2016-08-23: qty 4

## 2016-08-23 MED ORDER — FUROSEMIDE 20 MG PO TABS
20.0000 mg | ORAL_TABLET | Freq: Every day | ORAL | Status: DC
  Administered 2016-08-23 – 2016-08-24 (×2): 20 mg via ORAL
  Filled 2016-08-23 (×2): qty 1

## 2016-08-23 NOTE — Progress Notes (Signed)
CRITICAL VALUE ALERT  Critical value received: INR 7.64  Date of notification:  08/23/2016  Time of notification: 0510  Critical value read back:Yes  Nurse who received alert: Fredrich RomansBianca Cherry, RN  MD notified (1st page):  Schorr  Time of first page:  [phone]  MD notified (2nd page): n/a  Time of second page:n/a  Responding MD: Schorr  Time MD responded: MD was paged. She arrived to floor shortly after page to assess patient during rapid response.

## 2016-08-23 NOTE — Care Management Note (Signed)
Case Management Note  Patient Details  Name: Kristin Wilkerson MRN: [id number] Date of Birth: 08-Apr-1926  Subjective/Objective:        urosepsis            Action/Plan: From [facility] Date:  Aug 23, 2016  Chart reviewed for concurrent status and case management needs.  Will continue to follow patient progress.  Discharge Planning: following for needs  Expected discharge date: [id number]  Kristin Wilkerson, BSN, [place]3, [place]CCM   [phone]   Expected Discharge Date:                  Expected Discharge Plan:  Skilled Nursing Facility  In-House Referral:  Clinical Social Work  Discharge planning Services  CM Consult  Post Acute Care Choice:    Choice offered to:     DME Arranged:    DME Agency:     HH Arranged:    HH Agency:     Status of Service:  In process, will continue to follow  If discussed at Long Length of Stay Meetings, dates discussed:    Additional Comments:  Kristin Wilkerson, Kristin Sage Lynn, RN 08/23/2016, 8:28 AM

## 2016-08-23 NOTE — Progress Notes (Signed)
PROGRESS NOTE    Kristin Wilkerson  ZOX:[id number] DOB: 01-31-1926 DOA: 08/21/2016 PCP: Kimber Relic, MD   Brief Narrative: Kristin Wilkerson is a 83 y.o. female with a history of dementia, hypertension. She presented with increased cough and generalized weakness in addition to altered mental status. Urinalysis significant for likely urinary tract infection. Started on ceftriaxone empirically. CT head unremarkable. INR elevated secondary to chronic Coumadin use and has been held. Developed acute respiratory failure likely secondary to heart failure exacerbation requiring BiPAP for a very short period of time and weaning to room air.  Assessment & Plan:   Principal Problem:   Acute lower UTI Active Problems:   AKI (acute kidney injury) (HCC)   CAP (community acquired pneumonia)   Elevated INR   UTI (urinary tract infection)   Urinary tract infection Gram negative rods on urine culture. Afebrile. Blood cultures not resulted yet -continue ceftriaxone -urine culture pending for organism ID and sensitivities -blood cultures pending  Altered mental status Patient is still not at baseline per daughter. CT head negative. Possibly secondary to infection. Blood cultures pending. -continue to treat UTI as above -continue to cover for possible pneumonia as below  ?pneumonia Possible aspiration. Chest x-ray not significant for radiographic evidence of pneumonia -continue Azithromycin -add metronidazole for anaerobe coverage. Can switch to Augmentin pending urine cultures -SLP consult  Acute respiratory failure Secondary to acute diastolic heart failure from IV fluids and holding of diuretics. Required BiPAP now on nasal canula. -repeat echocardiogram to assess cardiac function -wean to room air -Restart home Lasix -Daily weights -Strict in and out  Acute kidney injury Thought to be secondary aggressive diuresis. Fluids given and diuretics stopped. AKI resolved.  Coagulopathy History of  pulmonary embolism  Secondary to warfarin use. No bleeding. -continue to hold warfarin -start heparin if INR goes less than 2  Hypertension -continue metoprolol -restart losartan  Dementia/depression -continue Namenda -continue Cymbalta -continue Remeron   DVT prophylaxis: SCDs until INR is therapeutic Code Status: DNR/DNI Family Communication: Daughter at bedside Disposition Plan: Pending clinical improvement.   Consultants:   None  Procedures:   None  Antimicrobials:  Ceftriaxone  Azithromycin  Metronidazole    Subjective:  Patient reports no dyspnea or chest pain.  Objective: Vitals:   08/23/16 0630 08/23/16 0700 08/23/16 0808 08/23/16 0827  BP: (!) 167/99 133/69    Pulse: 98 96 93   Resp: 20 18 (!) 21   Temp: 99.9 F (37.7 C) 98.8 F (37.1 C)    TempSrc: Axillary Axillary    SpO2: 98% 97% 94% 92%  Weight: 78.7 kg (173 lb 8 oz)     Height: 5\' 7"  (1.702 m)       Intake/Output Summary (Last 24 hours) at 08/23/16 1056 Last data filed at 08/23/16 0900  Gross per 24 hour  Intake          3440.92 ml  Output             1400 ml  Net          2040.92 ml   Filed Weights   08/22/16 0044 08/22/16 0335 08/23/16 0630  Weight: 79.4 kg (175 lb) 78.7 kg (173 lb 8 oz) 78.7 kg (173 lb 8 oz)    Examination:  General exam: Appears calm and comfortable Respiratory system: Clear to auscultation And diminished at bases. Respiratory effort normal. Cardiovascular system: S1 & S2 heard, RRR. No murmurs. Gastrointestinal system: Abdomen is nondistended, soft and nontender. Normal bowel sounds heard.  Central nervous system: Alert and oriented to person only. No focal neurological deficits. Extremities: No calf tenderness Skin: No cyanosis. No rashes Psychiatry: Judgement and insight appear  impaired Mood & affect  depressed and flat     Data Reviewed: I have personally reviewed following labs and imaging studies  CBC:  Recent Labs Lab 08/22/16 0020  08/23/16 0809  WBC 17.6* 13.2*  HGB 10.6* 10.3*  HCT 33.6* 33.4*  MCV 79.4 81.3  PLT 490* 402*   Basic Metabolic Panel:  Recent Labs Lab 08/22/16 0020 08/23/16 0809  NA 138 142  K 3.5 3.3*  CL 99* 102  CO2 25 25  GLUCOSE 123* 112*  BUN 37* 19  CREATININE 1.18* 0.83  CALCIUM 9.4 9.2   GFR: Estimated Creatinine Clearance: 50.6 mL/min (by C-G formula based on SCr of 0.83 mg/dL). Liver Function Tests:  Recent Labs Lab 08/22/16 0020  AST 26  ALT 23  ALKPHOS 126  BILITOT 0.4  PROT 8.6*  ALBUMIN 3.2*   No results for input(s): LIPASE, AMYLASE in the last 168 hours. No results for input(s): AMMONIA in the last 168 hours. Coagulation Profile:  Recent Labs Lab 08/22/16 0020 08/23/16 0400  INR 8.39* 7.64*   Cardiac Enzymes:  Recent Labs Lab 08/22/16 0020  TROPONINI <0.03   BNP (last 3 results) No results for input(s): PROBNP in the last 8760 hours. HbA1C: No results for input(s): HGBA1C in the last 72 hours. CBG: No results for input(s): GLUCAP in the last 168 hours. Lipid Profile: No results for input(s): CHOL, HDL, LDLCALC, TRIG, CHOLHDL, LDLDIRECT in the last 72 hours. Thyroid Function Tests: No results for input(s): TSH, T4TOTAL, FREET4, T3FREE, THYROIDAB in the last 72 hours. Anemia Panel: No results for input(s): VITAMINB12, FOLATE, FERRITIN, TIBC, IRON, RETICCTPCT in the last 72 hours. Sepsis Labs:  Recent Labs Lab 08/22/16 0044 08/23/16 0809  LATICACIDVEN 2.46* 1.4    Recent Results (from the past 240 hour(s))  Urine culture     Status: Abnormal (Preliminary result)   Collection Time: 08/22/16 12:20 AM  Result Value Ref Range Status   Specimen Description URINE, CATHETERIZED  Final   Special Requests Normal  Final   Culture >=100,000 COLONIES/mL GRAM NEGATIVE RODS (A)  Final   Report Status PENDING  Incomplete  MRSA PCR Screening     Status: None   Collection Time: 08/22/16  4:00 AM  Result Value Ref Range Status   MRSA by PCR  NEGATIVE NEGATIVE Final    Comment:        The GeneXpert MRSA Assay (FDA approved for NASAL specimens only), is one component of a comprehensive MRSA colonization surveillance program. It is not intended to diagnose MRSA infection nor to guide or monitor treatment for MRSA infections.          Radiology Studies: Ct Head Wo Contrast  Result Date: 08/22/2016 CLINICAL DATA:  Acute onset of altered mental status. Elevated INR. Initial encounter. EXAM: CT HEAD WITHOUT CONTRAST TECHNIQUE: Contiguous axial images were obtained from the base of the skull through the vertex without intravenous contrast. COMPARISON:  CT of the head performed 05/08/2015 FINDINGS: Brain: No evidence of acute infarction, hemorrhage, hydrocephalus, extra-axial collection or mass lesion/mass effect. Prominence of the ventricles and sulci reflects moderate cortical volume loss. Cerebellar atrophy is noted. Mild periventricular white matter change likely reflects small vessel ischemic microangiopathy. The brainstem and fourth ventricle are within normal limits. The basal ganglia are unremarkable in appearance. The cerebral hemispheres demonstrate grossly normal gray-white differentiation.  No mass effect or midline shift is seen. Vascular: No hyperdense vessel or unexpected calcification. Skull: There is no evidence of fracture; visualized osseous structures are unremarkable in appearance. Sinuses/Orbits: The orbits are within normal limits. There is opacification of the right side of the sphenoid sinus. The remaining paranasal sinuses and mastoid air cells are well-aerated. Other: No significant soft tissue abnormalities are seen. IMPRESSION: 1. No acute intracranial pathology seen on CT. 2. Moderate cortical volume loss and scattered small vessel ischemic microangiopathy. Electronically Signed   By: Roanna Raider M.D.   On: 08/22/2016 02:00   Dg Chest Port 1 View  Result Date: 08/23/2016 CLINICAL DATA:  Increasing shortness  of breath. Low oxygen saturations. EXAM: PORTABLE CHEST 1 VIEW COMPARISON:  08/21/2016 FINDINGS: Shallow inspiration with linear atelectasis in the lung bases. Cardiac enlargement. Increasing pulmonary vascularity since previous study. No definite edema or consolidation. No blunting of costophrenic angles. No pneumothorax. Tortuous aorta. Rotated patient positioning limits examination. IMPRESSION: Cardiac enlargement with developing pulmonary vascular congestion. Shallow inspiration with atelectasis in the lung bases. Electronically Signed   By: Burman Nieves M.D.   On: 08/23/2016 05:36   Dg Chest Port 1 View  Result Date: 08/22/2016 CLINICAL DATA:  Weakness, cough, shortness of breath, fever. Congestion. Hypertension. History of CHF and dementia. Recent incontinence EXAM: PORTABLE CHEST 1 VIEW COMPARISON:  05/08/2015 FINDINGS: Shallow inspiration with linear atelectasis in the lung bases. Mild cardiac enlargement. No vascular congestion or edema. No blunting of costophrenic angles. No pneumothorax. Tortuous aorta. Degenerative changes in the spine. IMPRESSION: Shallow inspiration with linear atelectasis in the lung bases. Cardiac enlargement. Electronically Signed   By: Burman Nieves M.D.   On: 08/22/2016 00:08        Scheduled Meds: . acetaminophen  1,000 mg Oral TID  . albuterol  2.5 mg Nebulization TID  . azithromycin  250 mg Oral Daily  . cholecalciferol  1,000 Units Oral Q2000  . DULoxetine  60 mg Oral Daily  . famotidine  20 mg Oral BID  . fluticasone furoate-vilanterol  1 puff Inhalation Daily  . furosemide  40 mg Intravenous BID  . latanoprost  1 drop Both Eyes QHS  . memantine  10 mg Oral BID  . metoprolol  100 mg Oral BID  . mirtazapine  15 mg Oral QHS  . sulfacetamide  1 drop Right Eye QID  . tobramycin  1-2 drop Both Eyes QID   Continuous Infusions: . cefTRIAXone (ROCEPHIN)  IV Stopped (08/22/16 2209)     LOS: 1 day     Jacquelin Hawking, MD [facility] 08/23/2016, 10:56 AM Pager: [phone]  If 7PM-7AM, please contact night-coverage [email or web address] Password [email or web address] 08/23/2016, 10:56 AM

## 2016-08-23 NOTE — Significant Event (Signed)
Rapid Response Event Note   Overview: Time Called: 0525 Event Type: Respiratory, Cardiac  Bedside RN called RRT RN in regards to patient increase work of breathing, RR within the 40s. Respiratory at bedside placed on Partial Rebreather.Upon arrival, patient seems to be in respiratory distress.   Initial Focused Assessment: Neuro: Alert x 2, confused, agitated at times. Pupils 4 round and equal, brisk  Cardiac: HR (SR 80s-90s), S1 and S2 sounds distant Lungs: Coarse Crackles, RR 30s-40s, WOB increased   Interventions: Cloyde ReamsKatherine Schoor notified and ordered 40 mgs of lasix. Decrease IV N.S from 16925ml/hr to 7075ml/hr.CXR ordered STAT. Place on Bipap. Place Foley to measure Strict I/O. Trx to Stepdown.  Plan of Care (if not transferred):   Event Summary:   at  0542 197/114 (138) SPO2 100% HR 93 RR 30    at  0546 192/160 (171) SPO2 100% HR 96 RR 35            0552 207/97 (131) SPO2 100% HR 94 RR 40                    0601 145/131 (138) SPO2 100% HR 97 RR 35         Charlisa Cham C

## 2016-08-23 NOTE — Progress Notes (Signed)
Notified Aram CandelaShelby RN that IV fluid rate of NS was KVO since patient was having CHF exacerbation and now receiving lasix to diuresis. Awaiting for MD to reassess IV fluid maintenance.

## 2016-08-23 NOTE — Progress Notes (Signed)
Patient this am became extremely short of breath with increased work of breathing.  Patient's oxygen saturation stayed in the 90's.  Her respiratory rate stayed in the 30's-40's.  Respiratory was called and a breathing treatment was given with no improvement.  MD and Rapid Response was notified. RR and MD came up to assess patient. Patient's blood pressure became elevated d/t increased work of breathing. Chest xray was obtained, lasix given, and fluid rate decreased.  Patient was quickly transferred to ICU to be placed on Bipap.  Report was given to ICU RN and patient's daughter was called and notified.Priscille KluverLineback, Sharonna Vinje M

## 2016-08-23 NOTE — Progress Notes (Signed)
CSW consulted to assist with d/c planning. Pt is a [facility] pt [facility]. Pt is unable to participate in d/c planning due to cognitive deficits. Pt's daughter, Donnella ShamCarolyn Titsworth [phone], has assist with dc planning during previous admissions. CSW was unable to reach daughter this am or leave VM. CSW will attempt to reach daughter later today to offer assist with d/c planning back to SNF when stable.  Cori RazorJamie Jayden Kratochvil LCSW [phone]

## 2016-08-23 NOTE — Progress Notes (Signed)
Shift event note:  Notified by RN that pt awoke acutely SOB w/ wheezes and some crackles in setting of normal 02 sats and RR of 35-40. Record reviewed. PCXR requested and order placed for Lasix 40 mg IV. IVF rate decreased from 125 cc/hr to 75 cc/hr. RR RN was paged and responded to bedside. Upon my arrival to bedside pt noted w/ increased WOB w/ RR 40's and use of accessory muscles. BBS w/ inspiratory/expiratory wheezes and diffuse crackles bil. Pt noted to have very wet sounding cough. 02 sats 92-96% on partial NRB. BP elevated but likely driven by resp effort. Pt remains afebrile. After 20-30 min observation at bedside pt does appear to improve somewhat but still w/ significant increased WOB. Pt able to speak in 2-3 word sentences and denies pain though is somewhat confused at baseline so not a reliable historian. PCXR findings c/w developing pulmonary vascular congestion. Assessment/Plan: 1. Dypsnea: In setting of pt w/ h/o CHF off Lasix since admission w/ IVF at 125 cc/h. Findings on assessment support component of volume overload. Will transfer to SDU for BiPAP while diuresing. Place foley. Will defer further changes in plan to rounding MD who will re-eval pt and determine need for continued increased level of care today. Continue to monitor closely in SDU. Family notified by Sharyl NimrodMeredith, RN.   Leanne ChangKatherine P. Dennette Faulconer, NP [facility] Pager [phone]

## 2016-08-24 ENCOUNTER — Inpatient Hospital Stay (HOSPITAL_COMMUNITY): Payer: Medicare Other

## 2016-08-24 DIAGNOSIS — I517 Cardiomegaly: Secondary | ICD-10-CM

## 2016-08-24 LAB — BASIC METABOLIC PANEL
ANION GAP: 11 (ref 5–15)
BUN: 28 mg/dL — ABNORMAL HIGH (ref 6–20)
CALCIUM: 9.4 mg/dL (ref 8.9–10.3)
CHLORIDE: 100 mmol/L — AB (ref 101–111)
CO2: 28 mmol/L (ref 22–32)
Creatinine, Ser: 1.37 mg/dL — ABNORMAL HIGH (ref 0.44–1.00)
GFR calc non Af Amer: 33 mL/min — ABNORMAL LOW (ref >60)
GFR, EST AFRICAN AMERICAN: 39 mL/min — AB (ref >60)
GLUCOSE: 95 mg/dL (ref 65–99)
POTASSIUM: 2.8 mmol/L — AB (ref 3.5–5.1)
Sodium: 139 mmol/L (ref 135–145)

## 2016-08-24 LAB — PROTIME-INR
INR: 5.9
Prothrombin Time: 54.6 seconds — ABNORMAL HIGH (ref 11.4–15.2)

## 2016-08-24 LAB — URINE CULTURE
Culture: >=100000 — AB
SPECIAL REQUESTS: NORMAL

## 2016-08-24 LAB — CBC
HEMATOCRIT: 30.8 % — AB (ref 36.0–46.0)
HEMOGLOBIN: 9.4 g/dL — AB (ref 12.0–15.0)
MCH: 24.5 pg — AB (ref 26.0–34.0)
MCHC: 30.5 g/dL (ref 30.0–36.0)
MCV: 80.2 fL (ref 78.0–100.0)
Platelets: 377 10*3/uL (ref 150–400)
RBC: 3.84 MIL/uL — AB (ref 3.87–5.11)
RDW: 16.9 % — ABNORMAL HIGH (ref 11.5–15.5)
WBC: 8.9 10*3/uL (ref 4.0–10.5)

## 2016-08-24 LAB — ECHOCARDIOGRAM COMPLETE
Height: 67 in
Weight: 2860.69 oz

## 2016-08-24 MED ORDER — FUROSEMIDE 10 MG/ML IJ SOLN
40.0000 mg | Freq: Every day | INTRAMUSCULAR | Status: DC
  Administered 2016-08-24 – 2016-08-28 (×5): 40 mg via INTRAVENOUS
  Filled 2016-08-24 (×5): qty 4

## 2016-08-24 MED ORDER — POTASSIUM CHLORIDE CRYS ER 20 MEQ PO TBCR
40.0000 meq | EXTENDED_RELEASE_TABLET | Freq: Two times a day (BID) | ORAL | Status: DC
  Administered 2016-08-24 – 2016-08-28 (×9): 40 meq via ORAL
  Filled 2016-08-24 (×9): qty 2

## 2016-08-24 NOTE — Progress Notes (Signed)
  Echocardiogram 2D Echocardiogram has been performed.  Jaydee Conran L Androw 08/24/2016, 1:27 PM

## 2016-08-24 NOTE — Evaluation (Addendum)
Clinical/Bedside Swallow Evaluation Patient Details  Name: Kristin Wilkerson MRN: [id number] Date of Birth: May 01, 1925  Today's Date: 08/24/2016 Time: SLP Start Time (ACUTE ONLY): 1020 SLP Stop Time (ACUTE ONLY): 1040 SLP Time Calculation (min) (ACUTE ONLY): 20 min  Past Medical History:  Past Medical History:  Diagnosis Date  . Anemia   . Anxiety   . CHF (congestive heart failure) (HCC)   . Dementia   . Hyperlipidemia   . Hypertension    Past Surgical History:  Past Surgical History:  Procedure Laterality Date  . ABDOMINAL HYSTERECTOMY    . BILATERAL OOPHORECTOMY    . CARDIOVASCULAR STRESS TEST  11/16/2009   Perfusion defect seen in inferior myocardial region consistent with diaphragmatic attenuation. Remaining myocardium demonstrates normalmyocardial perfusion with no evidence of ischemia or infarct. No ECG changes. EKG negative for ischemia.  . CHOLECYSTECTOMY    . I&D KNEE WITH POLY EXCHANGE Right 04/21/2015   Procedure: IRRIGATION AND DEBRIDEMENT RIGHT KNEE WITH PATELLECTOMY AND REPAIR OF COMPLEX OPEN WOUND;  Surgeon: Ranee Gosselinonald Gioffre, MD;  Location: [facility];  Service: Orthopedics;  Laterality: Right;  . I&D RIGHT KNEE WITH PATELLECTOMY  04/21/2015  . LUMBAR LAMINECTOMY  12/04/2012   Complete decompressive L3-4 and L4-5--Dr. [name]  . TOTAL KNEE ARTHROPLASTY Right 02/11/2015   Procedure: TOTAL KNEE ARTHROPLASTY;  Surgeon: Ranee Gosselinonald Gioffre, MD;  Location: [facility];  Service: Orthopedics;  Laterality: Right;  . TRANSTHORACIC ECHOCARDIOGRAM  09/01/2011   EF >55% and moderate tricuspid valve regurg.   HPI:  83 yo pt admitted 08/21/16 with AMS - ? pna.  Pt was diagnosed with UTI, transferred to ICU due to respiratory difficulties.  Pt with h/o dementia and resides at facility, CHF, acute kidney injury.  She has previously had a palliative meeting in 04/2015 when she had a knee wound.  Pt for swallow evaluation due to concerns for dysphagi.     Assessment / Plan / Recommendation Clinical Impression  Pt minimally participative, only accepted 3 boluses total and was disoriented.  Pt has baseline congested cough but also cough after EACH bolus tested.  SlP can not rule out aspiration clinically.  Pt may benefit from consideration for MBS when she is able to participate.  In the interim, she would benefit from consideration for NPO - x ice/water and medicine with puree.  Will follow up.  Of note, pt had a pallaitive consult previously and SlP suspects she may benefit from repeat referral.   SLP Visit Diagnosis: Dysphagia, oropharyngeal phase (R13.12)    Aspiration Risk  Risk for inadequate nutrition/hydration;Severe aspiration risk    Diet Recommendation NPO except meds;Ice chips PRN after oral care        Other  Recommendations     Follow up Recommendations        Frequency and Duration min 2x/week  2 weeks       Prognosis Prognosis for Safe Diet Advancement: Guarded Barriers to Reach Goals: Severity of deficits      Swallow Study   General Date of Onset: 08/24/16 HPI: 83 yo pt admitted 08/21/16 with AMS - ? pna.  Pt was diagnosed with UTI, transferred to ICU due to respiratory difficulties.  Pt with h/o dementia and resides at facility, CHF, acute kidney injury.  She has previously had a palliative meeting in 04/2015 when she had a knee wound.  Pt for swallow evaluation due to concerns for dysphagi.   Type of Study: Bedside Swallow Evaluation Diet Prior to this Study: Regular;Thin liquids Temperature Spikes Noted:  No Respiratory Status: Nasal cannula History of Recent Intubation: No Behavior/Cognition: Confused;Impulsive;Distractible;Uncooperative;Doesn't follow directions Oral Cavity Assessment: Dry Oral Care Completed by SLP: No Oral Cavity - Dentition: Adequate natural dentition Self-Feeding Abilities: Total assist Patient Positioning: Upright in bed Baseline Vocal Quality: Normal Volitional Cough: Congested;Weak Volitional Swallow: Unable to elicit    Oral/Motor/Sensory  Function Overall Oral Motor/Sensory Function: Generalized oral weakness   Ice Chips Ice chips: Not tested   Thin Liquid Thin Liquid: Impaired Presentation: Spoon Oral Phase Impairments: Reduced labial seal;Reduced lingual movement/coordination Oral Phase Functional Implications: Prolonged oral transit Pharyngeal  Phase Impairments: Cough - Immediate;Cough - Delayed;Suspected delayed Swallow    Nectar Thick Nectar Thick Liquid: Impaired Presentation: Spoon Oral Phase Impairments: Reduced labial seal;Reduced lingual movement/coordination Pharyngeal Phase Impairments: Cough - Immediate   Honey Thick Honey Thick Liquid: Not tested   Puree Puree: Impaired Presentation: Self Fed;Spoon Oral Phase Impairments: Reduced labial seal Oral Phase Functional Implications: Prolonged oral transit Pharyngeal Phase Impairments: Suspected delayed Swallow   Solid   GO   Solid: Not tested        Donavan Burnet, MS [facility] SLP [phone]  Spoke to RN re: pt evaluation results and she reported MD was aware of results/recommendations.  1145 am

## 2016-08-24 NOTE — Progress Notes (Addendum)
PROGRESS NOTE    Kristin Wilkerson  ZOX:[id number] DOB: 02/01/1926 DOA: 08/21/2016 PCP: Kimber Relic, MD   Brief Narrative: 56 female with a history of  Dementia-last MMSE 01/2016 was 24/30  hypertension. 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  Urinalysis significant for likely urinary tract infection.  Started on ceftriaxone empirically. CT head unremarkable.  INR elevated secondary to chronic Coumadin use and has been held  Developed acute respiratory failure likely secondary to heart failure exacerbation requiring BiPAP for a very short period of time and weaning to room air.  Assessment & Plan:   Principal Problem:   Acute lower UTI Active Problems:   AKI (acute kidney injury) (HCC)   CAP (community acquired pneumonia)   Elevated INR   UTI (urinary tract infection)   E.coli Urinary tract infection-sens pending Gram negative rods on urine culture. Afebrile. Blood cultures NGTD since 08/22/16 -continue ceftriaxone-->Ancef.  swithc to PO when possible -urine culture e.coli  Altered mental status Patient is still not at baseline per daughter. CT head negative. Possibly secondary to infection. Blood cultures pending. -continue to treat UTI as above -continue to cover for possible pneumonia as below -if not resolving, orient clearly-open window, discourage daytime nap  Dysphagia and possible esophageal web Possible aspiration-MBS shows retention of fluid with no penetration -Chest x-ray not significant for radiographic evidence of pneumonia -initially covered for [employer] PNA c Azithromycin--held on 5/9--have also d/c metronidazole for anaerobe coverage.  -SLP consult recs dys 2 diet As no PNa on xray encourage supportive management-stop azithro and flagyll  Acute respiratory  failure Secondary to acute diastolic heart failure from IV fluids and holding of diuretics. Required BiPAP now on nasal canula. -repeat echocardiogram 5/9=ef 60-65% + grd 1 DD -wean to room air -home Lasix--changed to lasix 40 daily IV -Daily weights -Strict in and out--still +1.6 liters  Acute kidney injury Thought to be secondary aggressive diuresi creatinins  Ranging 0/9--->1.3 check am labs Hypokalemia is being replaced with Kdur 40 bid  Coagulopathy-INR> 5 on admit History of pulmonary embolism  Secondary to warfarin use. No bleeding. -continue to hold warfarin -recheck INR 5.9 and is trending down from 8 on admit Last admission had a long discussion about GOC and need for possible pallaitive involvement-was rec ASA only on d/c--will need revisit as OP Discussed with daughter at bedisde 5/10 who will think about this  Chronic microcytic likely iron def anemia stable  Hypertension -continue metoprolol 100 bid -restart losartan 50 daily  Dementia/depression -continue Namenda 10 daily -continue Cymbalta 60 daily -continue Remeron 15 hs   DVT prophylaxis: SCDs until INR is therapeutic Code Status: DNR/DNI Family Communication: Daughter at bedside was updated 5/10 Disposition Plan: Pending clinical improvement.  Needs endo and Dr. Elnoria Howard consuklted to see patient   Consultants:   None  Procedures:   None  Antimicrobials:  Ceftriaxone 5/6-5/8  Azithromycin 5/7  Metronidazole 5/7   Subjective:  Awake but confused Hungry and want sto eat and drink  Objective: Vitals:   08/23/16 1333 08/23/16 2030 08/23/16 2127 08/24/16 0506  BP: (!) 117/52  102/63 (!) 117/47  Pulse: 70  84 (!) 58  Resp: (!) 22  18 18   Temp: 97.8 F (36.6 C)  98.5 F (36.9 C) 97.9 F (36.6 C)  TempSrc: Oral  Oral Oral  SpO2:  93% 97% 99% 94%  Weight:    81.1 kg (178 lb 12.7 oz)  Height:        Intake/Output Summary (Last 24 hours) at 08/24/16 0815 Last data filed at 08/24/16  0700  Gross per 24 hour  Intake               50 ml  Output             1000 ml  Net             -950 ml   Filed Weights   08/22/16 0335 08/23/16 0630 08/24/16 0506  Weight: 78.7 kg (173 lb 8 oz) 78.7 kg (173 lb 8 oz) 81.1 kg (178 lb 12.7 oz)    Examination:  Pleasant coughing no chill No rales no rhonchi s1 s2 no m/r/g abd soft nt nd no rebound no guard No le edema Neuro confused but moves all 4 limbs  Data Reviewed: I have personally reviewed following labs and imaging studies  CBC:  Recent Labs Lab 08/22/16 0020 08/23/16 0809 08/24/16 0507  WBC 17.6* 13.2* 8.9  HGB 10.6* 10.3* 9.4*  HCT 33.6* 33.4* 30.8*  MCV 79.4 81.3 80.2  PLT 490* 402* 377   Basic Metabolic Panel:  Recent Labs Lab 08/22/16 0020 08/23/16 0809 08/24/16 0507  NA 138 142 139  K 3.5 3.3* 2.8*  CL 99* 102 100*  CO2 25 25 28   GLUCOSE 123* 112* 95  BUN 37* 19 28*  CREATININE 1.18* 0.83 1.37*  CALCIUM 9.4 9.2 9.4   GFR: Estimated Creatinine Clearance: 31.1 mL/min (A) (by C-G formula based on SCr of 1.37 mg/dL (H)). Liver Function Tests:  Recent Labs Lab 08/22/16 0020  AST 26  ALT 23  ALKPHOS 126  BILITOT 0.4  PROT 8.6*  ALBUMIN 3.2*   No results for input(s): LIPASE, AMYLASE in the last 168 hours. No results for input(s): AMMONIA in the last 168 hours. Coagulation Profile:  Recent Labs Lab 08/22/16 0020 08/23/16 0400  INR 8.39* 7.64*   Cardiac Enzymes:  Recent Labs Lab 08/22/16 0020  TROPONINI <0.03   BNP (last 3 results) No results for input(s): PROBNP in the last 8760 hours. HbA1C: No results for input(s): HGBA1C in the last 72 hours. CBG: No results for input(s): GLUCAP in the last 168 hours. Lipid Profile: No results for input(s): CHOL, HDL, LDLCALC, TRIG, CHOLHDL, LDLDIRECT in the last 72 hours. Thyroid Function Tests: No results for input(s): TSH, T4TOTAL, FREET4, T3FREE, THYROIDAB in the last 72 hours. Anemia Panel: No results for input(s): VITAMINB12,  FOLATE, FERRITIN, TIBC, IRON, RETICCTPCT in the last 72 hours. Sepsis Labs:  Recent Labs Lab 08/22/16 0044 08/23/16 0809  LATICACIDVEN 2.46* 1.4    Recent Results (from the past 240 hour(s))  Urine culture     Status: Abnormal (Preliminary result)   Collection Time: 08/22/16 12:20 AM  Result Value Ref Range Status   Specimen Description URINE, CATHETERIZED  Final   Special Requests Normal  Final   Culture (A)  Final    >=100,000 COLONIES/mL ESCHERICHIA COLI SUSCEPTIBILITIES TO FOLLOW Performed at [facility], [address] [place], [place] [address]    Report Status PENDING  Incomplete  Culture, blood (routine x 2)     Status: None (Preliminary result)   Collection Time: 08/22/16 12:21 AM  Result Value Ref Range Status   Specimen Description BLOOD LEFT ANTECUBITAL  Final   Special Requests   Final    BOTTLES DRAWN AEROBIC  AND ANAEROBIC Blood Culture adequate volume   Culture   Final    NO GROWTH 1 DAY Performed at [facility], [address] [place], [place] [address]    Report Status PENDING  Incomplete  Culture, blood (routine x 2)     Status: None (Preliminary result)   Collection Time: 08/22/16 12:31 AM  Result Value Ref Range Status   Specimen Description BLOOD BLOOD RIGHT FOREARM  Final   Special Requests IN PEDIATRIC BOTTLE Blood Culture adequate volume  Final   Culture   Final    NO GROWTH 1 DAY Performed at [facility], [address] [place], [place] [address]    Report Status PENDING  Incomplete  MRSA PCR Screening     Status: None   Collection Time: 08/22/16  4:00 AM  Result Value Ref Range Status   MRSA by PCR NEGATIVE NEGATIVE Final    Comment:        The GeneXpert MRSA Assay (FDA approved for NASAL specimens only), is one component of a comprehensive MRSA colonization surveillance program. It is not intended to diagnose MRSA infection nor to guide or monitor treatment for MRSA infections.      Radiology Studies: Dg  Chest Port 1 View  Result Date: 08/23/2016 CLINICAL DATA:  Increasing shortness of breath. Low oxygen saturations. EXAM: PORTABLE CHEST 1 VIEW COMPARISON:  08/21/2016 FINDINGS: Shallow inspiration with linear atelectasis in the lung bases. Cardiac enlargement. Increasing pulmonary vascularity since previous study. No definite edema or consolidation. No blunting of costophrenic angles. No pneumothorax. Tortuous aorta. Rotated patient positioning limits examination. IMPRESSION: Cardiac enlargement with developing pulmonary vascular congestion. Shallow inspiration with atelectasis in the lung bases. Electronically Signed   By: Burman NievesWilliam  Stevens M.D.   On: 08/23/2016 05:36     Scheduled Meds: . acetaminophen  1,000 mg Oral TID  . albuterol  2.5 mg Nebulization TID  . azithromycin  250 mg Oral Daily  . cholecalciferol  1,000 Units Oral Q2000  . DULoxetine  60 mg Oral Daily  . famotidine  20 mg Oral BID  . fluticasone furoate-vilanterol  1 puff Inhalation Daily  . furosemide  40 mg Intravenous Daily  . latanoprost  1 drop Both Eyes QHS  . losartan  50 mg Oral Daily  . memantine  10 mg Oral BID  . metoprolol  100 mg Oral BID  . metroNIDAZOLE  500 mg Oral Q8H  . mirtazapine  15 mg Oral QHS  . potassium chloride  40 mEq Oral BID  . sulfacetamide  1 drop Right Eye QID  . tobramycin  1-2 drop Both Eyes QID   Continuous Infusions: . cefTRIAXone (ROCEPHIN)  IV Stopped (08/23/16 2303)     LOS: 2 days     Pleas KochJai Quintessa Simmerman, MD [facility](P) [phone]  If 7PM-7AM, please contact night-coverage [email or web address] Password [email or web address] 08/24/2016, 8:15 AM

## 2016-08-24 NOTE — Clinical Social Work Note (Signed)
Clinical Social Work Assessment  Patient Details  Name: Kristin Wilkerson T Gusler MRN: [id number] Date of Birth: Aug 23, 1925  Date of referral:  08/24/16               Reason for consult:  Facility Placement                Permission sought to share information with:  Facility Industrial/product designerContact Representative Permission granted to share information::  Yes, Verbal Permission Granted  Name::        Agency::     Relationship::     Contact Information:     Housing/Transportation Living arrangements for the past 2 months:  Skilled Nursing Facility Source of Information:  Adult Children Donnella Sham([name] Stoke [phone]) Patient Interpreter Needed:  None Criminal Activity/Legal Involvement Pertinent to Current Situation/Hospitalization:  No - Comment as needed Significant Relationships:  Adult Children Lives with:  Facility Resident Do you feel safe going back to the place where you live?  Yes Need for family participation in patient care:  Yes (Comment)  Care giving concerns:  Patient is a Niklas Chretien term care [facility] at [facility], plan is for patient to return.    Social Worker assessment / plan:  CSW spoke with patient's daughter at bedside, patient asleep. Patient's daughter reported that patient has been at current SNF since January 2017 and that patient is "the life of the party" at her SNF. Patient's daughter reported that patient was at home prior to SNF and that family could no longer care for patient in the home. Patient's daughter reported that the plan is for patient to return to SNF at discharge. CSW will complete FL2 and assist patient with discharge back to SNF when medically stable.   Employment status:    Insurance information:  Medicare PT Recommendations:    Information / Referral to community resources:  Skilled Nursing Facility  Patient/Family's Response to care:  Patient's daughter agreeable to patient's return back to current SNF.  Patient/Family's Understanding of and Emotional  Response to Diagnosis, Current Treatment, and Prognosis:  Patient's daughter verbalized strong understanding of patient's diagnosis, current treatment and prognosis. Patient's daughter reports that she knew patient was experiencing an UTI prior to being diagnosed at the hospital. Patient's daughter reported that she [facility] when patient's behavior changed, noting that patient has experienced this before. Patient's daughter presented pleasant and reported that she is happy with the care that the patient is receiving.    Emotional Assessment Appearance:  Appears stated age Attitude/Demeanor/Rapport:  Unable to Assess Affect (typically observed):  Unable to Assess Orientation:  Oriented to Self Alcohol / Substance use:  Not Applicable Psych involvement (Current and /or in the community):  No (Comment)  Discharge Needs  Concerns to be addressed:  Care Coordination Readmission within the last 30 days:  No Current discharge risk:  None Barriers to Discharge:  Continued Medical Work up   USG CorporationKimberly L Johanna Stafford, LCSW 08/24/2016, 2:07 PM

## 2016-08-24 NOTE — [place] FL2 (Signed)
[place] MEDICAID FL2 LEVEL OF CARE SCREENING TOOL     IDENTIFICATION  Patient Name: Kristin Wilkerson Birthdate: 1926/03/08 Sex: female Admission Date (Current Location): 08/21/2016  [facility] [place]   Facility and Address:  [facility],  [address], [place] [id number]      Provider Number: [id number]  Attending Physician Name and Address:  Rhetta MuraSamtani, Jai-Gurmukh, MD  Relative Name and Phone Number:       Current Level of Care: Hospital Recommended Level of Care: Skilled Nursing Facility Prior Approval Number:    Date Approved/Denied:   PASRR Number:    Discharge Plan: SNF    Current Diagnoses: Patient Active Problem List   Diagnosis Date Noted  . AKI (acute kidney injury) (HCC) 08/22/2016  . CAP (community acquired pneumonia) 08/22/2016  . Elevated INR 08/22/2016  . Acute lower UTI 08/22/2016  . UTI (urinary tract infection) 08/22/2016  . Pulmonary embolism (HCC) 05/08/2015  . Anxiety and depression 05/08/2015  . Chronic diastolic CHF (congestive heart failure), NYHA class 1 (HCC) 05/08/2015  . Benign essential HTN 05/08/2015  . Dyslipidemia 05/08/2015  . Normocytic anemia 05/08/2015  . Syncope 05/08/2015  . DNR (do not resuscitate)   . Palliative care encounter   . Pulmonary embolism, bilateral (HCC)   . Dementia 05/01/2015    Orientation RESPIRATION BLADDER Height & Weight     Self  Normal Incontinent Weight: 178 lb 12.7 oz (81.1 kg) Height:  5\' 7"  (170.2 cm)  BEHAVIORAL SYMPTOMS/MOOD NEUROLOGICAL BOWEL NUTRITION STATUS      Incontinent Diet (Heart)  AMBULATORY STATUS COMMUNICATION OF NEEDS Skin     Verbally Normal                       Personal Care Assistance Level of Assistance              Functional Limitations Info             SPECIAL CARE FACTORS FREQUENCY                       Contractures Contractures Info: Not present    Additional Factors Info  Code Status, Allergies Code  Status Info: DNR Allergies Info: NKA           Current Medications (08/24/2016):  This is the current hospital active medication list Current Facility-Administered Medications  Medication Dose Route Frequency Provider Last Rate Last Dose  . acetaminophen (TYLENOL) tablet 1,000 mg  1,000 mg Oral TID Hillary BowGardner, Jared M, DO   1,000 mg at 08/24/16 0957  . albuterol (PROVENTIL) (2.5 MG/3ML) 0.083% nebulizer solution 2.5 mg  2.5 mg Nebulization Q4H PRN Hillary BowGardner, Jared M, DO   2.5 mg at 08/23/16 0410  . albuterol (PROVENTIL) (2.5 MG/3ML) 0.083% nebulizer solution 2.5 mg  2.5 mg Nebulization TID Narda BondsNettey, Ralph A, MD   2.5 mg at 08/24/16 1329  . azithromycin (ZITHROMAX) tablet 250 mg  250 mg Oral Daily Lyda PeroneGardner, Jared M, DO   250 mg at 08/24/16 [id number]  . calcium carbonate (TUMS - dosed in mg elemental calcium) chewable tablet 400 mg of elemental calcium  2 tablet Oral Q4H PRN Hillary BowGardner, Jared M, DO      . cefTRIAXone (ROCEPHIN) 1 g in dextrose 5 % 50 mL IVPB  1 g Intravenous Q24H Hillary BowGardner, Jared M, DO   Stopped at 08/23/16 2303  . cholecalciferol (VITAMIN D) tablet 1,000 Units  1,000 Units Oral Q2000 Julian ReilGardner,  Heywood Iles, DO   1,000 Units at 08/23/16 2047  . DULoxetine (CYMBALTA) DR capsule 60 mg  60 mg Oral Daily Hillary Bow, DO   60 mg at 08/24/16 0957  . famotidine (PEPCID) tablet 20 mg  20 mg Oral BID Lyda Perone M, DO   20 mg at 08/24/16 1610  . fluticasone furoate-vilanterol (BREO ELLIPTA) 200-25 MCG/INH 1 puff  1 puff Inhalation Daily Lyda Perone M, DO   1 puff at 08/22/16 0854  . furosemide (LASIX) injection 40 mg  40 mg Intravenous Daily Rhetta Mura, MD   40 mg at 08/24/16 1258  . guaiFENesin-dextromethorphan (ROBITUSSIN DM) 100-10 MG/5ML syrup 15 mL  15 mL Oral Q4H PRN Hillary Bow, DO   15 mL at 08/23/16 2217  . latanoprost (XALATAN) 0.005 % ophthalmic solution 1 drop  1 drop Both Eyes QHS Lyda Perone M, DO   1 drop at 08/23/16 2217  . losartan (COZAAR) tablet 50 mg  50 mg Oral  Daily Narda Bonds, MD   50 mg at 08/24/16 0957  . memantine (NAMENDA) tablet 10 mg  10 mg Oral BID Lyda Perone M, DO   10 mg at 08/24/16 9604  . metoprolol tartrate (LOPRESSOR) tablet 100 mg  100 mg Oral BID Lyda Perone M, DO   100 mg at 08/24/16 5409  . metroNIDAZOLE (FLAGYL) tablet 500 mg  500 mg Oral Q8H Narda Bonds, MD   500 mg at 08/24/16 1423  . mirtazapine (REMERON) tablet 15 mg  15 mg Oral QHS Lyda Perone M, DO   15 mg at 08/23/16 2216  . potassium chloride SA (K-DUR,KLOR-CON) CR tablet 40 mEq  40 mEq Oral BID Rhetta Mura, MD   40 mEq at 08/24/16 0958  . senna-docusate (Senokot-S) tablet 2 tablet  2 tablet Oral Daily PRN Hillary Bow, DO      . sulfacetamide (BLEPH-10) 10 % ophthalmic solution 1 drop  1 drop Right Eye QID Lyda Perone M, DO   1 drop at 08/24/16 1306  . tobramycin (TOBREX) 0.3 % ophthalmic solution 1-2 drop  1-2 drop Both Eyes QID Hillary Bow, DO   1 drop at 08/24/16 1306     Discharge Medications: Please see discharge summary for a list of discharge medications.  Relevant Imaging Results:  Relevant Lab Results:   Additional Information SSN [id number]  Antionette Poles, LCSW

## 2016-08-25 ENCOUNTER — Inpatient Hospital Stay (HOSPITAL_COMMUNITY): Payer: Medicare Other

## 2016-08-25 DIAGNOSIS — N390 Urinary tract infection, site not specified: Principal | ICD-10-CM

## 2016-08-25 LAB — CBC
HEMATOCRIT: 31.9 % — AB (ref 36.0–46.0)
Hemoglobin: 9.8 g/dL — ABNORMAL LOW (ref 12.0–15.0)
MCH: 24.7 pg — AB (ref 26.0–34.0)
MCHC: 30.7 g/dL (ref 30.0–36.0)
MCV: 80.6 fL (ref 78.0–100.0)
Platelets: 429 10*3/uL — ABNORMAL HIGH (ref 150–400)
RBC: 3.96 MIL/uL (ref 3.87–5.11)
RDW: 17 % — ABNORMAL HIGH (ref 11.5–15.5)
WBC: 7.2 10*3/uL (ref 4.0–10.5)

## 2016-08-25 LAB — BASIC METABOLIC PANEL
ANION GAP: 10 (ref 5–15)
BUN: 29 mg/dL — ABNORMAL HIGH (ref 6–20)
CHLORIDE: 103 mmol/L (ref 101–111)
CO2: 27 mmol/L (ref 22–32)
Calcium: 9.5 mg/dL (ref 8.9–10.3)
Creatinine, Ser: 0.97 mg/dL (ref 0.44–1.00)
GFR calc non Af Amer: 51 mL/min — ABNORMAL LOW (ref >60)
GFR, EST AFRICAN AMERICAN: 59 mL/min — AB (ref >60)
GLUCOSE: 97 mg/dL (ref 65–99)
POTASSIUM: 3.8 mmol/L (ref 3.5–5.1)
Sodium: 140 mmol/L (ref 135–145)

## 2016-08-25 LAB — PROTIME-INR
INR: 4.71
PROTHROMBIN TIME: 45.6 s — AB (ref 11.4–15.2)

## 2016-08-25 MED ORDER — CEFAZOLIN SODIUM-DEXTROSE 1-4 GM/50ML-% IV SOLN
1.0000 g | Freq: Two times a day (BID) | INTRAVENOUS | Status: AC
  Administered 2016-08-25: 1 g via INTRAVENOUS
  Filled 2016-08-25: qty 50

## 2016-08-25 NOTE — Progress Notes (Signed)
PROGRESS NOTE    Kristin Wilkerson  ZOX:[id number] DOB: 03/04/1926 DOA: 08/21/2016 PCP: Kimber Relic, MD   Brief Narrative: 18 female with a history of  Dementia-last MMSE 01/2016 was 24/30  hypertension. 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  Urinalysis significant for likely urinary tract infection.  Started on ceftriaxone empirically. CT head unremarkable.  INR elevated secondary to chronic Coumadin use and has been held  Developed acute respiratory failure likely secondary to heart failure exacerbation requiring BiPAP for a very short period of time and weaning to room air.  Assessment & Plan:   Principal Problem:   Acute lower UTI Active Problems:   AKI (acute kidney injury) (HCC)   CAP (community acquired pneumonia)   Elevated INR   UTI (urinary tract infection)   E.coli Urinary tract infection-sens pending Gram negative rods on urine culture. Afebrile. Blood cultures NGTD since 08/22/16 - ceftriaxone changed to ancef 5/10 -urine culture e.coli  Altered mental status Patient is still not at baseline per daughter. CT head negative. Possibly secondary to infection. Blood cultures pending. -continue to treat UTI as above -continue to cover for possible pneumonia as below -if not resolving, orient clearly-open window, discourage daytime nap  ?pneumonia Possible aspiration.  -Chest x-ray 5/10 shows atelectasis not significant for radiographic evidence of pneumonia -initially covered for ASP PNA c Azithromycin--held on 5/9--have also d/c metronidazole for anaerobe coverage.  -SLP consult recs MBS and NPO for now As no PNa on xray encourage supportive management-stop azithro and flagyll  Acute respiratory failure Secondary to acute diastolic heart failure from IV  fluids and holding of diuretics. Required BiPAP now on nasal canula. -repeat echocardiogram 5/9=ef 60-65% + grd 1 DD -wean to room air -home Lasix--changed to lasix 40 daily IV -Daily weights -Strict in and out--now - 900 cc  Acute kidney injury Thought to be secondary aggressive diuresis creatinins  Ranging 0/9--->1.3 Hypokalemia is being replaced with Kdur 40 bid labs am  Coagulopathy-INR> 5 on admit History of pulmonary embolism  Secondary to warfarin use. No bleeding. -continue to hold warfarin -recheck INR now 4.7 and is trending down from 8 on admit Last admission had a long discussion about GOC and need for possible palliative involvement-was rec ASA only on d/c--will need revisit as OP  Chronic microcytic likely iron def anemia stable  Hypertension -continue metoprolol 100 bid -restart losartan 50 daily  Dementia/depression -continue Namenda 10 daily -continue Cymbalta 60 daily -continue Remeron 15 hs Try to mminimize non-essential meds.     DVT prophylaxis: SCDs until INR is therapeutic Code Status: DNR/DNI Family Communication: Daughter not avialable today Disposition Plan: Pending clinical improvement.   Consultants:   None  Procedures:   None  Antimicrobials:  Ceftriaxone 5/6-5/8  Azithromycin 5/7  Metronidazole 5/7   Subjective:  Awake confused and thristy Need sot be NPO except Ice chips Not really completely making sense but is alert No pains No fever Coughing at bedside conituously  Objective: Vitals:   08/24/16 1329 08/24/16 1400 08/24/16 2203 08/25/16 0408  BP:  112/72 134/60 121/64  Pulse: 69 67 79 64  Resp: 16  16 16   Temp:  97.6 F (36.4 C) 97.7 F (36.5 C) 97.7 F (36.5 C)  TempSrc:  Oral Oral Axillary  SpO2: 93% 92% 94% 94%  Weight:  77.6 kg (171 lb 1.2 oz)  Height:        Intake/Output Summary (Last 24 hours) at 08/25/16 0914 Last data filed at 08/25/16 0600  Gross per 24 hour  Intake              170 ml    Output              950 ml  Net             -780 ml   Filed Weights   08/23/16 0630 08/24/16 0506 08/25/16 0408  Weight: 78.7 kg (173 lb 8 oz) 81.1 kg (178 lb 12.7 oz) 77.6 kg (171 lb 1.2 oz)    Examination:  rousable alert pleasant  No cardior resp distress. s1 s 2slight tachy No m/r/g abd soft nt nd No le swelling No rash No edema Chest still a little rhonchorous  Data Reviewed: I have personally reviewed following labs and imaging studies  CBC:  Recent Labs Lab 08/22/16 0020 08/23/16 0809 08/24/16 0507 08/25/16 0448  WBC 17.6* 13.2* 8.9 7.2  HGB 10.6* 10.3* 9.4* 9.8*  HCT 33.6* 33.4* 30.8* 31.9*  MCV 79.4 81.3 80.2 80.6  PLT 490* 402* 377 429*   Basic Metabolic Panel:  Recent Labs Lab 08/22/16 0020 08/23/16 0809 08/24/16 0507 08/25/16 0448  NA 138 142 139 140  K 3.5 3.3* 2.8* 3.8  CL 99* 102 100* 103  CO2 25 25 28 27   GLUCOSE 123* 112* 95 97  BUN 37* 19 28* 29*  CREATININE 1.18* 0.83 1.37* 0.97  CALCIUM 9.4 9.2 9.4 9.5   GFR: Estimated Creatinine Clearance: 43 mL/min (by C-G formula based on SCr of 0.97 mg/dL). Liver Function Tests:  Recent Labs Lab 08/22/16 0020  AST 26  ALT 23  ALKPHOS 126  BILITOT 0.4  PROT 8.6*  ALBUMIN 3.2*   No results for input(s): LIPASE, AMYLASE in the last 168 hours. No results for input(s): AMMONIA in the last 168 hours. Coagulation Profile:  Recent Labs Lab 08/22/16 0020 08/23/16 0400 08/24/16 0948 08/25/16 0823  INR 8.39* 7.64* 5.90* PENDING   Cardiac Enzymes:  Recent Labs Lab 08/22/16 0020  TROPONINI <0.03   BNP (last 3 results) No results for input(s): PROBNP in the last 8760 hours. HbA1C: No results for input(s): HGBA1C in the last 72 hours. CBG: No results for input(s): GLUCAP in the last 168 hours. Lipid Profile: No results for input(s): CHOL, HDL, LDLCALC, TRIG, CHOLHDL, LDLDIRECT in the last 72 hours. Thyroid Function Tests: No results for input(s): TSH, T4TOTAL, FREET4, T3FREE,  THYROIDAB in the last 72 hours. Anemia Panel: No results for input(s): VITAMINB12, FOLATE, FERRITIN, TIBC, IRON, RETICCTPCT in the last 72 hours. Sepsis Labs:  Recent Labs Lab 08/22/16 0044 08/23/16 0809  LATICACIDVEN 2.46* 1.4    Recent Results (from the past 240 hour(s))  Urine culture     Status: Abnormal   Collection Time: 08/22/16 12:20 AM  Result Value Ref Range Status   Specimen Description URINE, CATHETERIZED  Final   Special Requests Normal  Final   Culture >=100,000 COLONIES/mL ESCHERICHIA COLI (A)  Final   Report Status 08/24/2016 FINAL  Final   Organism ID, Bacteria ESCHERICHIA COLI (A)  Final      Susceptibility   Escherichia coli - MIC*    AMPICILLIN >=32 RESISTANT Resistant     CEFAZOLIN <=4 SENSITIVE Sensitive     CEFTRIAXONE <=1 SENSITIVE Sensitive     CIPROFLOXACIN >=4 RESISTANT Resistant  GENTAMICIN <=1 SENSITIVE Sensitive     IMIPENEM <=0.25 SENSITIVE Sensitive     NITROFURANTOIN <=16 SENSITIVE Sensitive     TRIMETH/SULFA <=20 SENSITIVE Sensitive     AMPICILLIN/SULBACTAM >=32 RESISTANT Resistant     PIP/TAZO <=4 SENSITIVE Sensitive     Extended ESBL NEGATIVE Sensitive     * >=100,000 COLONIES/mL ESCHERICHIA COLI  Culture, blood (routine x 2)     Status: None (Preliminary result)   Collection Time: 08/22/16 12:21 AM  Result Value Ref Range Status   Specimen Description BLOOD LEFT ANTECUBITAL  Final   Special Requests   Final    BOTTLES DRAWN AEROBIC AND ANAEROBIC Blood Culture adequate volume   Culture   Final    NO GROWTH 2 DAYS Performed at [facility], [address] [place], [place] [address]    Report Status PENDING  Incomplete  Culture, blood (routine x 2)     Status: None (Preliminary result)   Collection Time: 08/22/16 12:31 AM  Result Value Ref Range Status   Specimen Description BLOOD BLOOD RIGHT FOREARM  Final   Special Requests IN PEDIATRIC BOTTLE Blood Culture adequate volume  Final   Culture   Final    NO GROWTH 2  DAYS Performed at [facility], [address] [place], [place] [address]    Report Status PENDING  Incomplete  MRSA PCR Screening     Status: None   Collection Time: 08/22/16  4:00 AM  Result Value Ref Range Status   MRSA by PCR NEGATIVE NEGATIVE Final    Comment:        The GeneXpert MRSA Assay (FDA approved for NASAL specimens only), is one component of a comprehensive MRSA colonization surveillance program. It is not intended to diagnose MRSA infection nor to guide or monitor treatment for MRSA infections.      Radiology Studies: Dg Chest Port 1 View  Result Date: 08/25/2016 CLINICAL DATA:  Pleural effusion. EXAM: PORTABLE CHEST 1 VIEW COMPARISON:  08/23/2016 FINDINGS: The patient is rotated to the right. The cardiomediastinal silhouette is unchanged. The lungs are hypoinflated. Pulmonary vascular congestion has decreased. Mild bibasilar opacities are stable to mildly improved and likely reflect atelectasis. No sizable pleural effusion or pneumothorax is identified. IMPRESSION: Decreased pulmonary vascular congestion and mild bibasilar atelectasis. Electronically Signed   By: Sebastian AcheAllen  Grady M.D.   On: 08/25/2016 07:21     Scheduled Meds: . acetaminophen  1,000 mg Oral TID  . cholecalciferol  1,000 Units Oral Q2000  . DULoxetine  60 mg Oral Daily  . famotidine  20 mg Oral BID  . fluticasone furoate-vilanterol  1 puff Inhalation Daily  . furosemide  40 mg Intravenous Daily  . latanoprost  1 drop Both Eyes QHS  . losartan  50 mg Oral Daily  . memantine  10 mg Oral BID  . metoprolol  100 mg Oral BID  . mirtazapine  15 mg Oral QHS  . potassium chloride  40 mEq Oral BID  . sulfacetamide  1 drop Right Eye QID  . tobramycin  1-2 drop Both Eyes QID   Continuous Infusions: .  ceFAZolin (ANCEF) IV       LOS: 3 days     Pleas KochJai Tymara Saur, MD [facility](P) [phone]  If 7PM-7AM, please contact night-coverage [email or web address] Password [email or web address] 08/25/2016, 9:14 AM

## 2016-08-25 NOTE — Consult Note (Addendum)
Modified Barium Swallow Progress Note  Patient Details  Name: Kristin Wilkerson MRN: [id number] Date of Birth: [birth year]  Today's Date: 08/25/2016  Modified Barium Swallow completed.  Full report located under Chart Review in the Imaging Section.  Brief recommendations include the following:  Clinical Impression Pt presents with mild oropharyngeal dysphagia, characterized by poor bolus formation of puree, and delayed swallow reflex on both thin and puree consistencies. Trigger noted at the vallecula on both consistencies.  No penetration or aspiration, and no pharyngeal post-swallow residue was seen on any trial of thin liquid or puree consistency. Solid consistency was not tested, as the puree bolus was noted to have stopped in the upper esophagus. Intermittent visualization over the next 20 minutes revealed a minimal amount of the bolus had continued through the esophagus. Liquid wash was unsuccessful in clearing esophageal stasis. MBS was terminated at this time, and Dr. Mahala MenghiniSamtani was called to discuss findings. At this point, recommend thin liquids (no solids) and consideration of GI consult to evaluate nature and extent of esophageal dysphagia. ST will follow up and proceed as appropriate, once esophageal issues have been addressed. Esophageal pooling was significant enough that if po trials had continued, stacking back up to the level of the airway could have occurred, resulting in penetration and aspiration.   Swallow Evaluation Recommendations  Recommended Consults: Consider GI evaluation   SLP Diet Recommendations: Thin liquid   Liquid Administration via: Cup;Straw   Medication Administration: Other (Comment) (per MD - whole pills or crushed pills in puree will not clear the esophagus.)   Supervision: Patient able to self feed   Compensations: Minimize environmental distractions;Small sips/bites;Slow rate   Postural Changes: Seated upright at 90 degrees;Remain semi-upright after after  feeds/meals (Comment)   Oral Care Recommendations: Oral care BID  Patrycja Mumpower B. MelmoreBueche, MSP, CCC-SLP [phone]   Leigh AuroraBueche, Denessa Cavan Brown 08/25/2016,3:55 PM

## 2016-08-25 NOTE — Consult Note (Signed)
Reason for Consult: Abnormal esophagram Referring Physician: [name] HPI: This is an 83 year old female with a PMH of dementia and HTN admitted for a URI, but further work up excluded this issue.  Her daughter provides the history of reports that she has been experiencing cough and heartburn for the past several months.  Speech Pathology evaluated the patient and her MBS today, reported to me by Dr. Mahala Menghini, revealed a primary distal esophageal process.  There was pooling of barium in the distal esophagus.  She denies any issues with dysphagia at this time.  As a result of this finding a GI consultation was requested.  A couple of days ago she suffered with SOB and it was felt that she had a component of CHF.  The echo revealed an EF of 60-65% and she was also diuresed.  Currently her pulse ox is at 94% on room air.    Past Medical History:  Diagnosis Date  . Anemia   . Anxiety   . CHF (congestive heart failure) (HCC)   . Dementia   . Hyperlipidemia   . Hypertension     Past Surgical History:  Procedure Laterality Date  . ABDOMINAL HYSTERECTOMY    . BILATERAL OOPHORECTOMY    . CARDIOVASCULAR STRESS TEST  11/16/2009   Perfusion defect seen in inferior myocardial region consistent with diaphragmatic attenuation. Remaining myocardium demonstrates normalmyocardial perfusion with no evidence of ischemia or infarct. No ECG changes. EKG negative for ischemia.  . CHOLECYSTECTOMY    . I&D KNEE WITH POLY EXCHANGE Right 04/21/2015   Procedure: IRRIGATION AND DEBRIDEMENT RIGHT KNEE WITH PATELLECTOMY AND REPAIR OF COMPLEX OPEN WOUND;  Surgeon: Ranee Gosselin, MD;  Location: [facility];  Service: Orthopedics;  Laterality: Right;  . I&D RIGHT KNEE WITH PATELLECTOMY  04/21/2015  . LUMBAR LAMINECTOMY  12/04/2012   Complete decompressive L3-4 and L4-5--Dr. Darrelyn Hillock  . TOTAL KNEE ARTHROPLASTY Right 02/11/2015   Procedure: TOTAL KNEE ARTHROPLASTY;  Surgeon: Ranee Gosselin, MD;  Location: [facility];   Service: Orthopedics;  Laterality: Right;  . TRANSTHORACIC ECHOCARDIOGRAM  09/01/2011   EF >55% and moderate tricuspid valve regurg.    Family History  Problem Relation Age of Onset  . Lung cancer Sister   . Stroke Mother   . Stroke Father     Social History:  reports that she has never smoked. She has never used smokeless tobacco. She reports that she does not drink alcohol or use drugs.  Allergies: No Known Allergies  Medications:  Scheduled: . acetaminophen  1,000 mg Oral TID  . cholecalciferol  1,000 Units Oral Q2000  . DULoxetine  60 mg Oral Daily  . famotidine  20 mg Oral BID  . fluticasone furoate-vilanterol  1 puff Inhalation Daily  . furosemide  40 mg Intravenous Daily  . latanoprost  1 drop Both Eyes QHS  . losartan  50 mg Oral Daily  . memantine  10 mg Oral BID  . metoprolol  100 mg Oral BID  . mirtazapine  15 mg Oral QHS  . potassium chloride  40 mEq Oral BID  . sulfacetamide  1 drop Right Eye QID  . tobramycin  1-2 drop Both Eyes QID   Continuous: .  ceFAZolin (ANCEF) IV      Results for orders placed or performed during the hospital encounter of 08/21/16 (from the past 24 hour(s))  Basic metabolic panel     Status: Abnormal   Collection Time: 08/25/16  4:48 AM  Result  Value Ref Range   Sodium 140 135 - 145 mmol/L   Potassium 3.8 3.5 - 5.1 mmol/L   Chloride 103 101 - 111 mmol/L   CO2 27 22 - 32 mmol/L   Glucose, Bld 97 65 - 99 mg/dL   BUN 29 (H) 6 - 20 mg/dL   Creatinine, Ser 4.090.97 0.44 - 1.00 mg/dL   Calcium 9.5 8.9 - 81.110.3 mg/dL   GFR calc non Af Amer 51 (L) >60 mL/min   GFR calc Af Amer 59 (L) >60 mL/min   Anion gap 10 5 - 15  CBC     Status: Abnormal   Collection Time: 08/25/16  4:48 AM  Result Value Ref Range   WBC 7.2 4.0 - 10.5 K/uL   RBC 3.96 3.87 - 5.11 MIL/uL   Hemoglobin 9.8 (L) 12.0 - 15.0 g/dL   HCT 91.431.9 (L) 78.236.0 - 95.646.0 %   MCV 80.6 78.0 - 100.0 fL   MCH 24.7 (L) 26.0 - 34.0 pg   MCHC 30.7 30.0 - 36.0 g/dL   RDW 21.317.0 (H) 08.611.5 -  15.5 %   Platelets 429 (H) 150 - 400 K/uL  Protime-INR     Status: Abnormal   Collection Time: 08/25/16  8:23 AM  Result Value Ref Range   Prothrombin Time 45.6 (H) 11.4 - 15.2 seconds   INR 4.71 (HH)      Dg Chest Port 1 View  Result Date: 08/25/2016 CLINICAL DATA:  Pleural effusion. EXAM: PORTABLE CHEST 1 VIEW COMPARISON:  08/23/2016 FINDINGS: The patient is rotated to the right. The cardiomediastinal silhouette is unchanged. The lungs are hypoinflated. Pulmonary vascular congestion has decreased. Mild bibasilar opacities are stable to mildly improved and likely reflect atelectasis. No sizable pleural effusion or pneumothorax is identified. IMPRESSION: Decreased pulmonary vascular congestion and mild bibasilar atelectasis. Electronically Signed   By: Sebastian AcheAllen  Grady M.D.   On: 08/25/2016 07:21    ROS:  As stated above in the HPI otherwise negative.  Blood pressure 121/64, pulse 64, temperature 97.7 F (36.5 C), temperature source Axillary, resp. rate 16, height 5\' 7"  (1.702 m), weight 77.6 kg (171 lb 1.2 oz), SpO2 94 %.    PE: Gen: NAD, Alert HEENT:  /AT, EOMI Neck: Supple, no LAD Lungs: CTA Bilaterally CV: RRR without M/G/R ABM: Soft, NTND, +BS Ext: No C/C/E  Assessment/Plan: 1) Abnormal esophagram. 2) Cough. 3) GERD.   With the findings on the esophagram I will pursue an EGD with possible dilation tomorrow AM.  I discussed my plan with her daughter Eber JonesCarolyn [phone] and she agrees with the plan of action.  Wylie Russon D 08/25/2016, 2:25 PM

## 2016-08-26 ENCOUNTER — Encounter (HOSPITAL_COMMUNITY): Payer: Self-pay | Admitting: *Deleted

## 2016-08-26 ENCOUNTER — Encounter (HOSPITAL_COMMUNITY)
Admission: EM | Disposition: A | Discharge status: Skilled Nursing Facility | Payer: Self-pay | Source: Home / Self Care | Attending: Family Medicine

## 2016-08-26 LAB — CBC WITH DIFFERENTIAL/PLATELET
Basophils Absolute: 0 10*3/uL (ref 0.0–0.1)
Basophils Relative: 0 %
EOS ABS: 0.2 10*3/uL (ref 0.0–0.7)
EOS PCT: 3 %
HCT: 34.2 % — ABNORMAL LOW (ref 36.0–46.0)
Hemoglobin: 10.2 g/dL — ABNORMAL LOW (ref 12.0–15.0)
LYMPHS ABS: 2.2 10*3/uL (ref 0.7–4.0)
LYMPHS PCT: 34 %
MCH: 24.2 pg — AB (ref 26.0–34.0)
MCHC: 29.8 g/dL — ABNORMAL LOW (ref 30.0–36.0)
MCV: 81.2 fL (ref 78.0–100.0)
MONO ABS: 0.7 10*3/uL (ref 0.1–1.0)
MONOS PCT: 10 %
Neutro Abs: 3.4 10*3/uL (ref 1.7–7.7)
Neutrophils Relative %: 53 %
PLATELETS: 493 10*3/uL — AB (ref 150–400)
RBC: 4.21 MIL/uL (ref 3.87–5.11)
RDW: 17.1 % — AB (ref 11.5–15.5)
WBC: 6.5 10*3/uL (ref 4.0–10.5)

## 2016-08-26 LAB — BASIC METABOLIC PANEL
Anion gap: 12 (ref 5–15)
BUN: 25 mg/dL — AB (ref 6–20)
CO2: 26 mmol/L (ref 22–32)
CREATININE: 0.9 mg/dL (ref 0.44–1.00)
Calcium: 9.8 mg/dL (ref 8.9–10.3)
Chloride: 102 mmol/L (ref 101–111)
GFR calc Af Amer: >60 mL/min (ref >60)
GFR, EST NON AFRICAN AMERICAN: 55 mL/min — AB (ref >60)
GLUCOSE: 107 mg/dL — AB (ref 65–99)
POTASSIUM: 4.4 mmol/L (ref 3.5–5.1)
Sodium: 140 mmol/L (ref 135–145)

## 2016-08-26 LAB — PROTIME-INR
INR: 4.06
Prothrombin Time: 40.5 seconds — ABNORMAL HIGH (ref 11.4–15.2)

## 2016-08-26 LAB — GLUCOSE, CAPILLARY: GLUCOSE-CAPILLARY: 105 mg/dL — AB (ref 65–99)

## 2016-08-26 SURGERY — EGD (ESOPHAGOGASTRODUODENOSCOPY)
Anesthesia: Moderate Sedation

## 2016-08-26 MED ORDER — LORAZEPAM 2 MG/ML IJ SOLN
0.5000 mg | INTRAMUSCULAR | Status: DC | PRN
  Administered 2016-08-26: 0.5 mg via INTRAVENOUS
  Filled 2016-08-26: qty 1

## 2016-08-26 MED ORDER — DIPHENHYDRAMINE HCL 25 MG PO CAPS
25.0000 mg | ORAL_CAPSULE | Freq: Once | ORAL | Status: AC
  Administered 2016-08-26: 25 mg via ORAL
  Filled 2016-08-26: qty 1

## 2016-08-26 MED ORDER — CEFAZOLIN SODIUM-DEXTROSE 1-4 GM/50ML-% IV SOLN
1.0000 g | Freq: Two times a day (BID) | INTRAVENOUS | Status: DC
  Administered 2016-08-26: 1 g via INTRAVENOUS
  Filled 2016-08-26 (×2): qty 50

## 2016-08-26 MED ORDER — VITAMIN K1 10 MG/ML IJ SOLN
5.0000 mg | Freq: Once | INTRAVENOUS | Status: AC
  Administered 2016-08-26: 5 mg via INTRAVENOUS
  Filled 2016-08-26 (×2): qty 0.5

## 2016-08-26 MED ORDER — SODIUM CHLORIDE 0.9 % IV SOLN: INTRAVENOUS | Status: DC

## 2016-08-26 MED ORDER — SODIUM CHLORIDE 0.9 % IV SOLN
Freq: Once | INTRAVENOUS | Status: AC
  Administered 2016-08-26: 15:00:00 via INTRAVENOUS

## 2016-08-26 MED ORDER — FUROSEMIDE 10 MG/ML IJ SOLN
20.0000 mg | Freq: Once | INTRAMUSCULAR | Status: AC
  Administered 2016-08-26: 20 mg via INTRAVENOUS
  Filled 2016-08-26: qty 2

## 2016-08-26 MED ORDER — ACETAMINOPHEN 325 MG PO TABS: 650.0000 mg | ORAL_TABLET | Freq: Once | ORAL | Status: DC

## 2016-08-26 NOTE — Progress Notes (Signed)
Subjective: No acute events.  Objective: Vital signs in last 24 hours: Temp:  [97.5 F (36.4 C)-98.2 F (36.8 C)] 98.2 F (36.8 C) (05/11 0536) Pulse Rate:  [70-85] 70 (05/11 0536) Resp:  [16] 16 (05/11 0536) BP: (115-137)/(65-95) 115/95 (05/11 0536) SpO2:  [92 %-96 %] 96 % (05/11 0536) Weight:  [77 kg (169 lb 12.1 oz)] 77 kg (169 lb 12.1 oz) (05/11 0536) Last BM Date: 08/25/16  Intake/Output from previous day: 05/10 0701 - 05/11 0700 In: 0  Out: 1200 [Urine:1200] Intake/Output this shift: No intake/output data recorded.  General appearance: alert GI: soft, non-tender; bowel sounds normal; no masses,  no organomegaly  Lab Results:  Recent Labs  08/24/16 0507 08/25/16 0448 08/26/16 0504  WBC 8.9 7.2 6.5  HGB 9.4* 9.8* 10.2*  HCT 30.8* 31.9* 34.2*  PLT 377 429* 493*   BMET  Recent Labs  08/24/16 0507 08/25/16 0448 08/26/16 0504  NA 139 140 140  K 2.8* 3.8 4.4  CL 100* 103 102  CO2 28 27 26   GLUCOSE 95 97 107*  BUN 28* 29* 25*  CREATININE 1.37* 0.97 0.90  CALCIUM 9.4 9.5 9.8   LFT No results for input(s): PROT, ALBUMIN, AST, ALT, ALKPHOS, BILITOT, BILIDIR, IBILI in the last 72 hours. PT/INR  Recent Labs  08/24/16 0948 08/25/16 0823  LABPROT 54.6* 45.6*  INR 5.90* 4.71*   Hepatitis Panel No results for input(s): HEPBSAG, HCVAB, HEPAIGM, HEPBIGM in the last 72 hours. C-Diff No results for input(s): CDIFFTOX in the last 72 hours. Fecal Lactopherrin No results for input(s): FECLLACTOFRN in the last 72 hours.  Studies/Results: Dg Chest Port 1 View  Result Date: 08/25/2016 CLINICAL DATA:  Pleural effusion. EXAM: PORTABLE CHEST 1 VIEW COMPARISON:  08/23/2016 FINDINGS: The patient is rotated to the right. The cardiomediastinal silhouette is unchanged. The lungs are hypoinflated. Pulmonary vascular congestion has decreased. Mild bibasilar opacities are stable to mildly improved and likely reflect atelectasis. No sizable pleural effusion or pneumothorax  is identified. IMPRESSION: Decreased pulmonary vascular congestion and mild bibasilar atelectasis. Electronically Signed   By: Sebastian Ache M.D.   On: 08/25/2016 07:21   Dg Swallowing Func-speech Pathology  Result Date: 08/25/2016 Objective Swallowing Evaluation: Type of Study: MBS-Modified Barium Swallow Study Patient Details Name: JERMEKA SCHLOTTERBECK MRN: [id number] Date of Birth: [birth year] Today's Date: 08/25/2016 Time: SLP Start Time (ACUTE ONLY): 1245-SLP Stop Time (ACUTE ONLY): 1305 SLP Time Calculation (min) (ACUTE ONLY): 20 min Past Medical History: Past Medical History: Diagnosis Date . Anemia  . Anxiety  . CHF (congestive heart failure) (HCC)  . Dementia  . Hyperlipidemia  . Hypertension  Past Surgical History: Past Surgical History: Procedure Laterality Date . ABDOMINAL HYSTERECTOMY   . BILATERAL OOPHORECTOMY   . CARDIOVASCULAR STRESS TEST  11/16/2009  Perfusion defect seen in inferior myocardial region consistent with diaphragmatic attenuation. Remaining myocardium demonstrates normalmyocardial perfusion with no evidence of ischemia or infarct. No ECG changes. EKG negative for ischemia. . CHOLECYSTECTOMY   . I&D KNEE WITH POLY EXCHANGE Right 04/21/2015  Procedure: IRRIGATION AND DEBRIDEMENT RIGHT KNEE WITH PATELLECTOMY AND REPAIR OF COMPLEX OPEN WOUND;  Surgeon: Ranee Gosselin, MD;  Location: [facility];  Service: Orthopedics;  Laterality: Right; . I&D RIGHT KNEE WITH PATELLECTOMY  04/21/2015 . LUMBAR LAMINECTOMY  12/04/2012  Complete decompressive L3-4 and L4-5--Dr. Darrelyn Hillock . TOTAL KNEE ARTHROPLASTY Right 02/11/2015  Procedure: TOTAL KNEE ARTHROPLASTY;  Surgeon: Ranee Gosselin, MD;  Location: [facility];  Service: Orthopedics;  Laterality: Right; . TRANSTHORACIC ECHOCARDIOGRAM  09/01/2011  EF >55% and moderate tricuspid valve regurg. HPI: 83 yo pt admitted 08/21/16 with AMS - ? pna.  Pt was diagnosed with UTI, transferred to ICU due to respiratory difficulties.  Pt with h/o dementia and resides at facility, CHF, acute kidney  injury.  She has previously had a palliative meeting in 04/2015 when she had a knee wound.  Pt for swallow evaluation due to concerns for dysphagia. MBS ordered due to increased coughing.  Subjective: Pt seen in radiology for completion of MBS to objectively assess oropharyngeal swallow function and safety. Assessment / Plan / Recommendation CHL IP CLINICAL IMPRESSIONS 08/25/2016 Clinical Impression Pt presents with mild oropharyngeal dysphagia, characterized by poor bolus formation of puree, and delayed swallow reflex on both thin and puree consistencies. Trigger noted at the vallecula on both consistencies.  No penetration or aspiration, and no pharyngeal post-swallow residue was seen on any trial of thin liquid or puree consistency. Solid consistency was not tested, as the puree bolus was noted to have stopped in the upper esophagus. Intermittent visualization over the next 20 minutes revealed a minimal amount of the bolus had continued through the esophagus. Liquid wash was unsuccessful in clearing esophageal stasis. MBS was terminated at this time, and Dr. Mahala Menghini was called to discuss findings. At this point, recommend thin liquids (no solids) and consideration of GI consult to evaluate nature and extent of esophageal dysphagia. ST will follow up and proceed as appropriate, once esophageal issues have been addressed. SLP Visit Diagnosis Dysphagia, pharyngoesophageal phase (R13.14) Impact on safety and function Risk for inadequate nutrition/hydration;Severe aspiration risk   CHL IP TREATMENT RECOMMENDATION 08/25/2016 Treatment Recommendations Therapy as outlined in treatment plan below   Prognosis 08/25/2016 Prognosis for Safe Diet Advancement Fair Barriers to Reach Goals Severity of deficits CHL IP DIET RECOMMENDATION 08/25/2016 SLP Diet Recommendations Thin liquid Liquid Administration via Cup;Straw Medication Administration Per MD - both whole pills AND meds crushed in puree will not pass through the esophagus at  this point in time Compensations Minimize environmental distractions;Small sips/bites;Slow rate Postural Changes Seated upright at 90 degrees; Remain semi-upright after after feeds/meals   CHL IP OTHER RECOMMENDATIONS 08/25/2016 Recommended Consults Consider GI evaluation Oral Care Recommendations Oral care BID   CHL IP FOLLOW UP RECOMMENDATIONS 08/25/2016 Follow up Recommendations ST will follow up after GI consult   CHL IP FREQUENCY AND DURATION 08/25/2016 Speech Therapy Frequency (ACUTE ONLY) min 2x/week Treatment Duration 2 weeks      CHL IP ORAL PHASE 08/25/2016 Oral Phase Impaired  CHL IP PHARYNGEAL PHASE 08/25/2016 Pharyngeal Phase Impaired Pharyngeal- Thin Cup Delayed swallow initiation-vallecula Pharyngeal- Thin Straw Delayed swallow initiation-vallecula Pharyngeal- Puree Delayed swallow initiation-vallecula;Pharyngeal residue - valleculae  CHL IP CERVICAL ESOPHAGEAL PHASE 08/25/2016 Cervical Esophageal Phase Impaired - puree was noted to stop in upper esophagus and did not clear, despite liquid wash and 20+ minutes of time seated upright. Minimal amounts were noted to seep through intermittently. MD was called to notify. Celia B. [place], [facility], CCC-SLP [phone] Leigh Aurora 08/25/2016, 3:50 PM               Medications:  Scheduled: . acetaminophen  1,000 mg Oral TID  . cholecalciferol  1,000 Units Oral Q2000  . DULoxetine  60 mg Oral Daily  . famotidine  20 mg Oral BID  . fluticasone furoate-vilanterol  1 puff Inhalation Daily  . furosemide  40 mg Intravenous Daily  . latanoprost  1 drop Both Eyes QHS  . losartan  50 mg Oral Daily  .  memantine  10 mg Oral BID  . metoprolol  100 mg Oral BID  . mirtazapine  15 mg Oral QHS  . potassium chloride  40 mEq Oral BID  . sulfacetamide  1 drop Right Eye QID  . tobramycin  1-2 drop Both Eyes QID   Continuous: .  ceFAZolin (ANCEF) IV      Assessment/Plan: 1) Dysphagia. 2) Abnormal esophagram.   INR yesterday at 4.7.  Unable to pursue EGD today  as the intent was also to dilate for stricture or to dilate prophylactically.  No new INR this AM.  Plan: 1) Recheck INR today.  If 1.5 or below and EGD and be attempted later today or sometime this weekend.  I informed the daughter of the situation.   LOS: 4 days   Soundra Lampley D 08/26/2016, 8:11 AM

## 2016-08-26 NOTE — Progress Notes (Signed)
CRITICAL VALUE ALERT  Critical value received:  INR 4.07  Date of notification:  5/11  Time of notification:  8:54  Critical value read back:Yes.    Nurse who received alert:  Kristin FordyceJohn Kyisha Fowle  MD notified (1st page):  Samtani  Time of first page:  9:29  MD notified (2nd page):  Time of second page:  Responding MD:    Time MD responded:

## 2016-08-26 NOTE — Care Management Important Message (Signed)
Important Message  Patient Details  Name: Kristin Wilkerson MRN: [id number] Date of Birth: Sep 25, 1925   Medicare Important Message Given:  Yes    Caren MacadamFuller, Debie Ashline 08/26/2016, 10:50 AMImportant Message  Patient Details  Name: Kristin Wilkerson MRN: [id number] Date of Birth: Sep 25, 1925   Medicare Important Message Given:  Yes    Caren MacadamFuller, Yago Ludvigsen 08/26/2016, 10:49 AM

## 2016-08-26 NOTE — Progress Notes (Signed)
SLP Cancellation Note  Patient Details Name: Kristin Wilkerson MRN: [id number] DOB: 05-29-1925   Cancelled treatment:        Per GI notes, goal is for EGD (for possible stricture requiring dilation) when INR at appropriate level. GI note today stated "if INR is 1.5 or below and EGD and be attempted later today or sometime this weekend." SLP will follow up for results of EGD.   Royce MacadamiaLitaker, Angello Chien Willis 08/26/2016, 11:06 AM   Breck CoonsLisa Willis Lonell FaceLitaker M.Ed [employer]-SLP Pager [phone]

## 2016-08-26 NOTE — Progress Notes (Signed)
CSW following for disposition/ DC planning.  [facility] and [facility] to return at [facility].  Spoke with Meryle ReadyGrace Sidney at facility who confirms pt can return when ready. Requested updated FL2 and medication list at DC.    Will continue following and assist with DC planning.   Ilean SkillMeghan Rozlynn Lippold, MSW, LCSW Clinical Social Work 08/26/2016 [phone]

## 2016-08-26 NOTE — Progress Notes (Signed)
PROGRESS NOTE    Kristin Wilkerson  WUJ:[id number] DOB: 07/27/1925 DOA: 08/21/2016 PCP: Kimber Relic, MD   Brief Narrative:   37 female with a history of  Dementia-last MMSE 01/2016 was 24/30  hypertension. 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 Urinalysis significant for likely urinary tract infection.  Started on ceftriaxone empirically. CT head unremarkable.  INR elevated secondary to chronic Coumadin use and has been held  Developed acute respiratory failure likely secondary to heart failure exacerbation requiring BiPAP for a very short period of time and weaning to room air.  Assessment & Plan:   Principal Problem:   Acute lower UTI Active Problems:   AKI (acute kidney injury) (HCC)   CAP (community acquired pneumonia)   Elevated INR   UTI (urinary tract infection)   E.coli Urinary tract infection-sens pending Gram negative rods on urine culture. Afebrile. Blood cultures NGTD since 08/22/16 - ceftriaxone changed to ancef 5/10--stopped 5/11 -urine culture e.coli  Altered mental status with underlying mild-mod dementia Patient is still not at baseline per daughter. CT head negative. Possibly secondary to infection. Blood cultures pending. -continue to treat UTI as above and stopped 5/11 as has been 7 days IV therapy -if not resolving, orient clearly-open window, discourage daytime nap  ?pneumonia Possible aspiration.  -Chest x-ray 5/10 shows atelectasis not significant for radiographic evidence of pneumonia -initially covered for [employer] PNA c Azithromycin--held on 5/9--have also d/c metronidazole for anaerobe coverage.  -SLP consult recs MBS and NPO for now -dr hung to scope am if inr below 1.5--guiven ffp and vit k 5.11 As no PNa on xray encourage supportive  management-stop azithro and flagyll  Acute respiratory failure Secondary to acute diastolic heart failure from IV fluids and holding of diuretics.  Required BiPAP transiently -repeat echocardiogram 5/9=ef 60-65% + grd 1 DD -wean to room air -home Lasix--changed to lasix 40 daily IV to further diurese -Daily weights -Strict in and out--now - 200 cc  Acute kidney injury Thought to be secondary aggressive diuresis creatinins  Ranging 0/9--->1.3-->25/0.9 Hypokalemia is being replaced with Kdur 40 bid labs am  Coagulopathy-INR> 5 on admit History of pulmonary embolism  Secondary to warfarin use. No bleeding. -continue to hold warfarin -recheck INR now 4.7 and is trending down from 8 on admit Last admission had a long discussion about GOC and need for possible palliative involvement-was rec ASA only on d/c--will need revisit as OP  Chronic microcytic likely iron def anemia stable  Hypertension -continue metoprolol 100 bid -restart losartan 50 daily  Dementia/depression -continue Namenda 10 daily -continue Cymbalta 60 daily -continue Remeron 15 hs Try to mminimize non-essential meds.     DVT prophylaxis: SCDs until INR is therapeutic Code Status: DNR/DNI Family Communication: Daughter and I had discussion 5/10 Disposition Plan: Pending clinical improvement.   Consultants:   None  Procedures:   None  Antimicrobials:  Ceftriaxone 5/6-5/8  Azithromycin 5/7  Metronidazole 5/7   Subjective:  Awake Agitated and doenst want anything No new issues No n/v   Objective: Vitals:   08/26/16 0536 08/26/16 0836 08/26/16 1403 08/26/16 1436  BP: (!) 115/95  98/69 (!) 142/73  Pulse: 70  65 66  Resp: 16  18 17   Temp: 98.2 F (36.8 C)  97.6 F (36.4 C) 97.8 F (36.6 C)  TempSrc: Oral  Oral  Oral  SpO2: 96% 92% 92% 93%  Weight: 77 kg (169 lb 12.1 oz)     Height:        Intake/Output Summary (Last 24 hours) at 08/26/16 1450 Last data filed at 08/25/16 1451   Gross per 24 hour  Intake                0 ml  Output              200 ml  Net             -200 ml   Filed Weights   08/24/16 0506 08/25/16 0408 08/26/16 0536  Weight: 81.1 kg (178 lb 12.7 oz) 77.6 kg (171 lb 1.2 oz) 77 kg (169 lb 12.1 oz)    Examination:  rousable disgruntled No cardio-resp distress. s1 s2 slight tachy abd soft  No edema Chest still a little rhonchorous  Data Reviewed: I have personally reviewed following labs and imaging studies  CBC:  Recent Labs Lab 08/22/16 0020 08/23/16 0809 08/24/16 0507 08/25/16 0448 08/26/16 0504  WBC 17.6* 13.2* 8.9 7.2 6.5  NEUTROABS  --   --   --   --  3.4  HGB 10.6* 10.3* 9.4* 9.8* 10.2*  HCT 33.6* 33.4* 30.8* 31.9* 34.2*  MCV 79.4 81.3 80.2 80.6 81.2  PLT 490* 402* 377 429* 493*   Basic Metabolic Panel:  Recent Labs Lab 08/22/16 0020 08/23/16 0809 08/24/16 0507 08/25/16 0448 08/26/16 0504  NA 138 142 139 140 140  K 3.5 3.3* 2.8* 3.8 4.4  CL 99* 102 100* 103 102  CO2 25 25 28 27 26   GLUCOSE 123* 112* 95 97 107*  BUN 37* 19 28* 29* 25*  CREATININE 1.18* 0.83 1.37* 0.97 0.90  CALCIUM 9.4 9.2 9.4 9.5 9.8   GFR: Estimated Creatinine Clearance: 46.2 mL/min (by C-G formula based on SCr of 0.9 mg/dL). Liver Function Tests:  Recent Labs Lab 08/22/16 0020  AST 26  ALT 23  ALKPHOS 126  BILITOT 0.4  PROT 8.6*  ALBUMIN 3.2*   No results for input(s): LIPASE, AMYLASE in the last 168 hours. No results for input(s): AMMONIA in the last 168 hours. Coagulation Profile:  Recent Labs Lab 08/22/16 0020 08/23/16 0400 08/24/16 0948 08/25/16 0823 08/26/16 0825  INR 8.39* 7.64* 5.90* 4.71* 4.06*   Cardiac Enzymes:  Recent Labs Lab 08/22/16 0020  TROPONINI <0.03   BNP (last 3 results) No results for input(s): PROBNP in the last 8760 hours. HbA1C: No results for input(s): HGBA1C in the last 72 hours. CBG:  Recent Labs Lab 08/26/16 0757  GLUCAP 105*   Lipid Profile: No results for input(s): CHOL,  HDL, LDLCALC, TRIG, CHOLHDL, LDLDIRECT in the last 72 hours. Thyroid Function Tests: No results for input(s): TSH, T4TOTAL, FREET4, T3FREE, THYROIDAB in the last 72 hours. Anemia Panel: No results for input(s): VITAMINB12, FOLATE, FERRITIN, TIBC, IRON, RETICCTPCT in the last 72 hours. Sepsis Labs:  Recent Labs Lab 08/22/16 0044 08/23/16 0809  LATICACIDVEN 2.46* 1.4    Recent Results (from the past 240 hour(s))  Urine culture     Status: Abnormal   Collection Time: 08/22/16 12:20 AM  Result Value Ref Range Status   Specimen Description URINE, CATHETERIZED  Final   Special Requests Normal  Final   Culture >=100,000 COLONIES/mL ESCHERICHIA COLI (A)  Final   Report Status 08/24/2016 FINAL  Final   Organism ID, Bacteria ESCHERICHIA COLI (A)  Final      Susceptibility   Escherichia  coli - MIC*    AMPICILLIN >=32 RESISTANT Resistant     CEFAZOLIN <=4 SENSITIVE Sensitive     CEFTRIAXONE <=1 SENSITIVE Sensitive     CIPROFLOXACIN >=4 RESISTANT Resistant     GENTAMICIN <=1 SENSITIVE Sensitive     IMIPENEM <=0.25 SENSITIVE Sensitive     NITROFURANTOIN <=16 SENSITIVE Sensitive     TRIMETH/SULFA <=20 SENSITIVE Sensitive     AMPICILLIN/SULBACTAM >=32 RESISTANT Resistant     PIP/TAZO <=4 SENSITIVE Sensitive     Extended ESBL NEGATIVE Sensitive     * >=100,000 COLONIES/mL ESCHERICHIA COLI  Culture, blood (routine x 2)     Status: None (Preliminary result)   Collection Time: 08/22/16 12:21 AM  Result Value Ref Range Status   Specimen Description BLOOD LEFT ANTECUBITAL  Final   Special Requests   Final    BOTTLES DRAWN AEROBIC AND ANAEROBIC Blood Culture adequate volume   Culture   Final    NO GROWTH 4 DAYS Performed at [facility], [address] [place], [place] [address]    Report Status PENDING  Incomplete  Culture, blood (routine x 2)     Status: None (Preliminary result)   Collection Time: 08/22/16 12:31 AM  Result Value Ref Range Status   Specimen Description BLOOD  BLOOD RIGHT FOREARM  Final   Special Requests IN PEDIATRIC BOTTLE Blood Culture adequate volume  Final   Culture   Final    NO GROWTH 4 DAYS Performed at [facility], [address] [place], [place] [address]    Report Status PENDING  Incomplete  MRSA PCR Screening     Status: None   Collection Time: 08/22/16  4:00 AM  Result Value Ref Range Status   MRSA by PCR NEGATIVE NEGATIVE Final    Comment:        The GeneXpert MRSA Assay (FDA approved for NASAL specimens only), is one component of a comprehensive MRSA colonization surveillance program. It is not intended to diagnose MRSA infection nor to guide or monitor treatment for MRSA infections.      Radiology Studies: Dg Chest Port 1 View  Result Date: 08/25/2016 CLINICAL DATA:  Pleural effusion. EXAM: PORTABLE CHEST 1 VIEW COMPARISON:  08/23/2016 FINDINGS: The patient is rotated to the right. The cardiomediastinal silhouette is unchanged. The lungs are hypoinflated. Pulmonary vascular congestion has decreased. Mild bibasilar opacities are stable to mildly improved and likely reflect atelectasis. No sizable pleural effusion or pneumothorax is identified. IMPRESSION: Decreased pulmonary vascular congestion and mild bibasilar atelectasis. Electronically Signed   By: Sebastian AcheAllen  Grady M.D.   On: 08/25/2016 07:21   Dg Swallowing Func-speech Pathology  Result Date: 08/25/2016 Objective Swallowing Evaluation: Type of Study: MBS-Modified Barium Swallow Study Patient Details Name: Carey Bullocksnn T Strauss MRN: [id number] Date of Birth: 14-Mar-1926 Today's Date: 08/25/2016 Time: SLP Start Time (ACUTE ONLY): 1245-SLP Stop Time (ACUTE ONLY): 1305 SLP Time Calculation (min) (ACUTE ONLY): 20 min Past Medical History: Past Medical History: Diagnosis Date . Anemia  . Anxiety  . CHF (congestive heart failure) (HCC)  . Dementia  . Hyperlipidemia  . Hypertension  Past Surgical History: Past Surgical History: Procedure Laterality Date . ABDOMINAL HYSTERECTOMY   .  BILATERAL OOPHORECTOMY   . CARDIOVASCULAR STRESS TEST  11/16/2009  Perfusion defect seen in inferior myocardial region consistent with diaphragmatic attenuation. Remaining myocardium demonstrates normalmyocardial perfusion with no evidence of ischemia or infarct. No ECG changes. EKG negative for ischemia. . CHOLECYSTECTOMY   . I&D KNEE WITH POLY EXCHANGE Right  04/21/2015  Procedure: IRRIGATION AND DEBRIDEMENT RIGHT KNEE WITH PATELLECTOMY AND REPAIR OF COMPLEX OPEN WOUND;  Surgeon: Ranee Gosselin, MD;  Location: [facility];  Service: Orthopedics;  Laterality: Right; . I&D RIGHT KNEE WITH PATELLECTOMY  04/21/2015 . LUMBAR LAMINECTOMY  12/04/2012  Complete decompressive L3-4 and L4-5--Dr. Darrelyn Hillock . TOTAL KNEE ARTHROPLASTY Right 02/11/2015  Procedure: TOTAL KNEE ARTHROPLASTY;  Surgeon: Ranee Gosselin, MD;  Location: [facility];  Service: Orthopedics;  Laterality: Right; . TRANSTHORACIC ECHOCARDIOGRAM  09/01/2011  EF >55% and moderate tricuspid valve regurg. HPI: 83 yo pt admitted 08/21/16 with AMS - ? pna.  Pt was diagnosed with UTI, transferred to ICU due to respiratory difficulties.  Pt with h/o dementia and resides at facility, CHF, acute kidney injury.  She has previously had a palliative meeting in 04/2015 when she had a knee wound.  Pt for swallow evaluation due to concerns for dysphagia. MBS ordered due to increased coughing.  Subjective: Pt seen in radiology for completion of MBS to objectively assess oropharyngeal swallow function and safety. Assessment / Plan / Recommendation CHL IP CLINICAL IMPRESSIONS 08/25/2016 Clinical Impression Pt presents with mild oropharyngeal dysphagia, characterized by poor bolus formation of puree, and delayed swallow reflex on both thin and puree consistencies. Trigger noted at the vallecula on both consistencies.  No penetration or aspiration, and no pharyngeal post-swallow residue was seen on any trial of thin liquid or puree consistency. Solid consistency was not tested, as the puree bolus was  noted to have stopped in the upper esophagus. Intermittent visualization over the next 20 minutes revealed a minimal amount of the bolus had continued through the esophagus. Liquid wash was unsuccessful in clearing esophageal stasis. MBS was terminated at this time, and Dr. Mahala Menghini was called to discuss findings. At this point, recommend thin liquids (no solids) and consideration of GI consult to evaluate nature and extent of esophageal dysphagia. ST will follow up and proceed as appropriate, once esophageal issues have been addressed. SLP Visit Diagnosis Dysphagia, pharyngoesophageal phase (R13.14) Impact on safety and function Risk for inadequate nutrition/hydration;Severe aspiration risk   CHL IP TREATMENT RECOMMENDATION 08/25/2016 Treatment Recommendations Therapy as outlined in treatment plan below   Prognosis 08/25/2016 Prognosis for Safe Diet Advancement Fair Barriers to Reach Goals Severity of deficits CHL IP DIET RECOMMENDATION 08/25/2016 SLP Diet Recommendations Thin liquid Liquid Administration via Cup;Straw Medication Administration Per MD - both whole pills AND meds crushed in puree will not pass through the esophagus at this point in time Compensations Minimize environmental distractions;Small sips/bites;Slow rate Postural Changes Seated upright at 90 degrees; Remain semi-upright after after feeds/meals   CHL IP OTHER RECOMMENDATIONS 08/25/2016 Recommended Consults Consider GI evaluation Oral Care Recommendations Oral care BID   CHL IP FOLLOW UP RECOMMENDATIONS 08/25/2016 Follow up Recommendations ST will follow up after GI consult   CHL IP FREQUENCY AND DURATION 08/25/2016 Speech Therapy Frequency (ACUTE ONLY) min 2x/week Treatment Duration 2 weeks      CHL IP ORAL PHASE 08/25/2016 Oral Phase Impaired  CHL IP PHARYNGEAL PHASE 08/25/2016 Pharyngeal Phase Impaired Pharyngeal- Thin Cup Delayed swallow initiation-vallecula Pharyngeal- Thin Straw Delayed swallow initiation-vallecula Pharyngeal- Puree Delayed  swallow initiation-vallecula;Pharyngeal residue - valleculae  CHL IP CERVICAL ESOPHAGEAL PHASE 08/25/2016 Cervical Esophageal Phase Impaired - puree was noted to stop in upper esophagus and did not clear, despite liquid wash and 20+ minutes of time seated upright. Minimal amounts were noted to seep through intermittently. MD was called to notify. Celia B. [place], [facility], CCC-SLP [phone] Leigh Aurora 08/25/2016, 3:50 PM  Scheduled Meds: . acetaminophen  1,000 mg Oral TID  . cholecalciferol  1,000 Units Oral Q2000  . DULoxetine  60 mg Oral Daily  . famotidine  20 mg Oral BID  . fluticasone furoate-vilanterol  1 puff Inhalation Daily  . furosemide  20 mg Intravenous Once  . furosemide  40 mg Intravenous Daily  . latanoprost  1 drop Both Eyes QHS  . losartan  50 mg Oral Daily  . memantine  10 mg Oral BID  . metoprolol  100 mg Oral BID  . mirtazapine  15 mg Oral QHS  . potassium chloride  40 mEq Oral BID  . sulfacetamide  1 drop Right Eye QID  . tobramycin  1-2 drop Both Eyes QID   Continuous Infusions: . sodium chloride    .  ceFAZolin (ANCEF) IV Stopped (08/26/16 1125)     LOS: 4 days     Pleas Koch, MD [facility]  If 7PM-7AM, please contact night-coverage [email or web address] Password [email or web address] 08/26/2016, 2:50 PM

## 2016-08-27 ENCOUNTER — Encounter (HOSPITAL_COMMUNITY)
Admission: EM | Disposition: A | Discharge status: Skilled Nursing Facility | Payer: Self-pay | Source: Home / Self Care | Attending: Family Medicine

## 2016-08-27 ENCOUNTER — Encounter (HOSPITAL_COMMUNITY): Payer: Self-pay

## 2016-08-27 DIAGNOSIS — K222 Esophageal obstruction: Secondary | ICD-10-CM

## 2016-08-27 DIAGNOSIS — K2100 Gastro-esophageal reflux disease with esophagitis, without bleeding: Secondary | ICD-10-CM

## 2016-08-27 HISTORY — PX: ESOPHAGOGASTRODUODENOSCOPY: SHX5428

## 2016-08-27 LAB — CULTURE, BLOOD (ROUTINE X 2)
Culture: NO GROWTH
Culture: NO GROWTH
SPECIAL REQUESTS: ADEQUATE
SPECIAL REQUESTS: ADEQUATE

## 2016-08-27 LAB — PREPARE FRESH FROZEN PLASMA: Unit division: 0

## 2016-08-27 LAB — BPAM FFP
Blood Product Expiration Date: 201805162359
ISSUE DATE / TIME: 201805110225
UNIT TYPE AND RH: 5100

## 2016-08-27 LAB — PROTIME-INR
INR: 1.32
PROTHROMBIN TIME: 16.5 s — AB (ref 11.4–15.2)

## 2016-08-27 LAB — CBC
HEMATOCRIT: 34.3 % — AB (ref 36.0–46.0)
HEMOGLOBIN: 10.5 g/dL — AB (ref 12.0–15.0)
MCH: 24.8 pg — ABNORMAL LOW (ref 26.0–34.0)
MCHC: 30.6 g/dL (ref 30.0–36.0)
MCV: 80.9 fL (ref 78.0–100.0)
Platelets: 470 10*3/uL — ABNORMAL HIGH (ref 150–400)
RBC: 4.24 MIL/uL (ref 3.87–5.11)
RDW: 17.2 % — AB (ref 11.5–15.5)
WBC: 7.4 10*3/uL (ref 4.0–10.5)

## 2016-08-27 SURGERY — EGD (ESOPHAGOGASTRODUODENOSCOPY)
Anesthesia: Moderate Sedation

## 2016-08-27 MED ORDER — FENTANYL CITRATE (PF) 100 MCG/2ML IJ SOLN
INTRAMUSCULAR | Status: AC
  Filled 2016-08-27: qty 2

## 2016-08-27 MED ORDER — BUTAMBEN-TETRACAINE-BENZOCAINE 2-2-14 % EX AERO
INHALATION_SPRAY | CUTANEOUS | Status: DC | PRN
  Administered 2016-08-27: 2 via TOPICAL

## 2016-08-27 MED ORDER — PANTOPRAZOLE SODIUM 40 MG PO TBEC
40.0000 mg | DELAYED_RELEASE_TABLET | Freq: Every day | ORAL | Status: DC
  Administered 2016-08-28: 40 mg via ORAL
  Filled 2016-08-27: qty 1

## 2016-08-27 MED ORDER — MIDAZOLAM HCL 10 MG/2ML IJ SOLN
INTRAMUSCULAR | Status: DC | PRN
  Administered 2016-08-27 (×2): 1 mg via INTRAVENOUS

## 2016-08-27 MED ORDER — MIDAZOLAM HCL 5 MG/ML IJ SOLN
INTRAMUSCULAR | Status: AC
Start: 2016-08-27 — End: 2016-08-27
  Filled 2016-08-27: qty 1

## 2016-08-27 NOTE — Progress Notes (Signed)
PROGRESS NOTE    Kristin Wilkerson  ZOX:[id number] DOB: July 30, 1925 DOA: 08/21/2016 PCP: Kimber Relic, MD   Brief Narrative:   50 female with a history of  Dementia-last MMSE 01/2016 was 24/30  hypertension. 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 Urinalysis significant for likely urinary tract infection.  Started on ceftriaxone empirically. CT head unremarkable.  INR elevated secondary to chronic Coumadin use and has been held  Developed acute respiratory failure likely secondary to heart failure exacerbation requiring BiPAP for a very short period of time and weaning to room air.  Assessment & Plan:   Principal Problem:   Acute lower UTI Active Problems:   AKI (acute kidney injury) (HCC)   CAP (community acquired pneumonia)   Elevated INR   UTI (urinary tract infection)   E.coli Urinary tract infection-sens pending Gram negative rods on urine culture. Afebrile. Blood cultures NGTD since 08/22/16 - ceftriaxone changed to ancef 5/10--stopped 5/11 -urine culture e.coli  Altered mental status with underlying mild-mod dementia  still not at baseline per daughter . CT head negative. Blood cultures ngtd. -continue to treat UTI as above and stopped 5/11 as has been 7 days IV therapy -if not resolving, orient clearly-open window, discourage daytime nap  ?pneumonia Possible aspiration.  -Chest x-ray 5/10 shows atelectasis not significant for radiographic evidence of pneumonia, no wbc no fever -initially covered for ASP PNA c Azithromycin--held on 5/9--have also d/c metronidazole for anaerobe coverage.  -SLP consult recs MBS and NPO for now -dr hung to scope am if inr below 1.5--given ffp and vit k 5.11--INR 1.3 at present As no PNa on xray encourage supportive management-stop  azithro and flagyll  Acute respiratory failure Secondary to acute diastolic heart failure from IV fluids and holding of diuretics.  Required BiPAP transiently -repeat echocardiogram 5/9=ef 60-65% + grd 1 DD -wean to room air -home Lasix--changed to lasix 40 daily IV to further diurese -Daily weights -Strict in and out--now - 200 cc  Acute kidney injury Thought to be secondary aggressive diuresis creatinins  Ranging 0/9--->1.3-->25/0.9 Hypokalemia is being replaced with Kdur 40 bid labs am  Coagulopathy-INR> 5 on admit History of pulmonary embolism  Secondary to warfarin use. No bleeding. -continue to hold warfarin -recheck INR now 4.7 and is trending down from 8 on admit--now 1.3 Last admission had a long discussion about GOC and need for possible palliative involvement-was rec ASA only on d/c--will need revisit as OP  Chronic microcytic likely iron def anemia stable  Hypertension -continue metoprolol 100 bid -restart losartan 50 daily  Dementia/depression -continue Namenda 10 daily -continue Cymbalta 60 daily -continue Remeron 15 hs Try to mminimize non-essential meds.     DVT prophylaxis: SCDs until INR is therapeutic Code Status: DNR/DNI Family Communication: Daughter and I had discussion 5/10 and 5/12 Disposition Plan: Pending clinical improvement--[place]will follow with SW   Consultants:   None  Procedures:   None  Antimicrobials:  Ceftriaxone 5/6-5/8  Azithromycin 5/7  Metronidazole 5/7   Subjective:  Confused needed ativan overnight No cp otherwise difficult to get ROS on her   Objective: Vitals:   08/26/16 1504 08/26/16 1718 08/26/16 2048 08/27/16 0519  BP: 128/69 (!) 140/97 114/81 133/71  Pulse: 65 72 77 60  Resp: 18 18  18   Temp: 97.5  F (36.4 C) 97.4 F (36.3 C) 97.7 F (36.5 C) 97.6 F (36.4 C)  TempSrc: Oral Oral Oral Axillary  SpO2: 92% 92% 91% 92%  Weight:    77 kg (169 lb  12.1 oz)  Height:        Intake/Output Summary (Last 24 hours) at 08/27/16 1109 Last data filed at 08/26/16 1718  Gross per 24 hour  Intake              365 ml  Output                0 ml  Net              365 ml   Filed Weights   08/25/16 0408 08/26/16 0536 08/27/16 0519  Weight: 77.6 kg (171 lb 1.2 oz) 77 kg (169 lb 12.1 oz) 77 kg (169 lb 12.1 oz)    Examination:  rousable disgruntled No cardio-resp distress. s1 s2 slight tachy abd soft    Data Reviewed: I have personally reviewed following labs and imaging studies  CBC:  Recent Labs Lab 08/23/16 0809 08/24/16 0507 08/25/16 0448 08/26/16 0504 08/27/16 0506  WBC 13.2* 8.9 7.2 6.5 7.4  NEUTROABS  --   --   --  3.4  --   HGB 10.3* 9.4* 9.8* 10.2* 10.5*  HCT 33.4* 30.8* 31.9* 34.2* 34.3*  MCV 81.3 80.2 80.6 81.2 80.9  PLT 402* 377 429* 493* 470*   Basic Metabolic Panel:  Recent Labs Lab 08/22/16 0020 08/23/16 0809 08/24/16 0507 08/25/16 0448 08/26/16 0504  NA 138 142 139 140 140  K 3.5 3.3* 2.8* 3.8 4.4  CL 99* 102 100* 103 102  CO2 25 25 28 27 26   GLUCOSE 123* 112* 95 97 107*  BUN 37* 19 28* 29* 25*  CREATININE 1.18* 0.83 1.37* 0.97 0.90  CALCIUM 9.4 9.2 9.4 9.5 9.8   GFR: Estimated Creatinine Clearance: 46.2 mL/min (by C-G formula based on SCr of 0.9 mg/dL). Liver Function Tests:  Recent Labs Lab 08/22/16 0020  AST 26  ALT 23  ALKPHOS 126  BILITOT 0.4  PROT 8.6*  ALBUMIN 3.2*   No results for input(s): LIPASE, AMYLASE in the last 168 hours. No results for input(s): AMMONIA in the last 168 hours. Coagulation Profile:  Recent Labs Lab 08/23/16 0400 08/24/16 0948 08/25/16 0823 08/26/16 0825 08/27/16 0506  INR 7.64* 5.90* 4.71* 4.06* 1.32   Cardiac Enzymes:  Recent Labs Lab 08/22/16 0020  TROPONINI <0.03   BNP (last 3 results) No results for input(s): PROBNP in the last 8760 hours. HbA1C: No results for input(s): HGBA1C in the last 72 hours. CBG:  Recent Labs Lab  08/26/16 0757  GLUCAP 105*   Lipid Profile: No results for input(s): CHOL, HDL, LDLCALC, TRIG, CHOLHDL, LDLDIRECT in the last 72 hours. Thyroid Function Tests: No results for input(s): TSH, T4TOTAL, FREET4, T3FREE, THYROIDAB in the last 72 hours. Anemia Panel: No results for input(s): VITAMINB12, FOLATE, FERRITIN, TIBC, IRON, RETICCTPCT in the last 72 hours. Sepsis Labs:  Recent Labs Lab 08/22/16 0044 08/23/16 0809  LATICACIDVEN 2.46* 1.4    Recent Results (from the past 240 hour(s))  Urine culture     Status: Abnormal   Collection Time: 08/22/16 12:20 AM  Result Value Ref Range Status   Specimen Description URINE, CATHETERIZED  Final   Special Requests Normal  Final   Culture >=100,000 COLONIES/mL ESCHERICHIA COLI (A)  Final   Report Status 08/24/2016 FINAL  Final   Organism ID,  Bacteria ESCHERICHIA COLI (A)  Final      Susceptibility   Escherichia coli - MIC*    AMPICILLIN >=32 RESISTANT Resistant     CEFAZOLIN <=4 SENSITIVE Sensitive     CEFTRIAXONE <=1 SENSITIVE Sensitive     CIPROFLOXACIN >=4 RESISTANT Resistant     GENTAMICIN <=1 SENSITIVE Sensitive     IMIPENEM <=0.25 SENSITIVE Sensitive     NITROFURANTOIN <=16 SENSITIVE Sensitive     TRIMETH/SULFA <=20 SENSITIVE Sensitive     AMPICILLIN/SULBACTAM >=32 RESISTANT Resistant     PIP/TAZO <=4 SENSITIVE Sensitive     Extended ESBL NEGATIVE Sensitive     * >=100,000 COLONIES/mL ESCHERICHIA COLI  Culture, blood (routine x 2)     Status: None (Preliminary result)   Collection Time: 08/22/16 12:21 AM  Result Value Ref Range Status   Specimen Description BLOOD LEFT ANTECUBITAL  Final   Special Requests   Final    BOTTLES DRAWN AEROBIC AND ANAEROBIC Blood Culture adequate volume   Culture   Final    NO GROWTH 4 DAYS Performed at [facility], [address] [place], [place] [address]    Report Status PENDING  Incomplete  Culture, blood (routine x 2)     Status: None (Preliminary result)   Collection Time:  08/22/16 12:31 AM  Result Value Ref Range Status   Specimen Description BLOOD BLOOD RIGHT FOREARM  Final   Special Requests IN PEDIATRIC BOTTLE Blood Culture adequate volume  Final   Culture   Final    NO GROWTH 4 DAYS Performed at [facility], [address], [place], [place] [address]    Report Status PENDING  Incomplete  MRSA PCR Screening     Status: None   Collection Time: 08/22/16  4:00 AM  Result Value Ref Range Status   MRSA by PCR NEGATIVE NEGATIVE Final    Comment:        The GeneXpert MRSA Assay (FDA approved for NASAL specimens only), is one component of a comprehensive MRSA colonization surveillance program. It is not intended to diagnose MRSA infection nor to guide or monitor treatment for MRSA infections.      Radiology Studies: Dg Swallowing Func-speech Pathology  Result Date: 08/25/2016 Objective Swallowing Evaluation: Type of Study: MBS-Modified Barium Swallow Study Patient Details Name: Kristin Wilkerson MRN: [id number] Date of Birth: Jul 07, 1925 Today's Date: 08/25/2016 Time: SLP Start Time (ACUTE ONLY): 1245-SLP Stop Time (ACUTE ONLY): 1305 SLP Time Calculation (min) (ACUTE ONLY): 20 min Past Medical History: Past Medical History: Diagnosis Date . Anemia  . Anxiety  . CHF (congestive heart failure) (HCC)  . Dementia  . Hyperlipidemia  . Hypertension  Past Surgical History: Past Surgical History: Procedure Laterality Date . ABDOMINAL HYSTERECTOMY   . BILATERAL OOPHORECTOMY   . CARDIOVASCULAR STRESS TEST  11/16/2009  Perfusion defect seen in inferior myocardial region consistent with diaphragmatic attenuation. Remaining myocardium demonstrates normalmyocardial perfusion with no evidence of ischemia or infarct. No ECG changes. EKG negative for ischemia. . CHOLECYSTECTOMY   . I&D KNEE WITH POLY EXCHANGE Right 04/21/2015  Procedure: IRRIGATION AND DEBRIDEMENT RIGHT KNEE WITH PATELLECTOMY AND REPAIR OF COMPLEX OPEN WOUND;  Surgeon: Ranee Gosselin, MD;  Location: [facility];   Service: Orthopedics;  Laterality: Right; . I&D RIGHT KNEE WITH PATELLECTOMY  04/21/2015 . LUMBAR LAMINECTOMY  12/04/2012  Complete decompressive L3-4 and L4-5--Dr. Darrelyn Hillock . TOTAL KNEE ARTHROPLASTY Right 02/11/2015  Procedure: TOTAL KNEE ARTHROPLASTY;  Surgeon: Ranee Gosselin, MD;  Location: [facility];  Service: Orthopedics;  Laterality: Right; . TRANSTHORACIC ECHOCARDIOGRAM  09/01/2011  EF >55% and moderate tricuspid valve regurg. HPI: 83 yo pt admitted 08/21/16 with AMS - ? pna.  Pt was diagnosed with UTI, transferred to ICU due to respiratory difficulties.  Pt with h/o dementia and resides at facility, CHF, acute kidney injury.  She has previously had a palliative meeting in 04/2015 when she had a knee wound.  Pt for swallow evaluation due to concerns for dysphagia. MBS ordered due to increased coughing.  Subjective: Pt seen in radiology for completion of MBS to objectively assess oropharyngeal swallow function and safety. Assessment / Plan / Recommendation CHL IP CLINICAL IMPRESSIONS 08/25/2016 Clinical Impression Pt presents with mild oropharyngeal dysphagia, characterized by poor bolus formation of puree, and delayed swallow reflex on both thin and puree consistencies. Trigger noted at the vallecula on both consistencies.  No penetration or aspiration, and no pharyngeal post-swallow residue was seen on any trial of thin liquid or puree consistency. Solid consistency was not tested, as the puree bolus was noted to have stopped in the upper esophagus. Intermittent visualization over the next 20 minutes revealed a minimal amount of the bolus had continued through the esophagus. Liquid wash was unsuccessful in clearing esophageal stasis. MBS was terminated at this time, and Dr. Mahala Menghini was called to discuss findings. At this point, recommend thin liquids (no solids) and consideration of GI consult to evaluate nature and extent of esophageal dysphagia. ST will follow up and proceed as appropriate, once esophageal issues have  been addressed. SLP Visit Diagnosis Dysphagia, pharyngoesophageal phase (R13.14) Impact on safety and function Risk for inadequate nutrition/hydration;Severe aspiration risk   CHL IP TREATMENT RECOMMENDATION 08/25/2016 Treatment Recommendations Therapy as outlined in treatment plan below   Prognosis 08/25/2016 Prognosis for Safe Diet Advancement Fair Barriers to Reach Goals Severity of deficits CHL IP DIET RECOMMENDATION 08/25/2016 SLP Diet Recommendations Thin liquid Liquid Administration via Cup;Straw Medication Administration Per MD - both whole pills AND meds crushed in puree will not pass through the esophagus at this point in time Compensations Minimize environmental distractions;Small sips/bites;Slow rate Postural Changes Seated upright at 90 degrees; Remain semi-upright after after feeds/meals   CHL IP OTHER RECOMMENDATIONS 08/25/2016 Recommended Consults Consider GI evaluation Oral Care Recommendations Oral care BID   CHL IP FOLLOW UP RECOMMENDATIONS 08/25/2016 Follow up Recommendations ST will follow up after GI consult   CHL IP FREQUENCY AND DURATION 08/25/2016 Speech Therapy Frequency (ACUTE ONLY) min 2x/week Treatment Duration 2 weeks      CHL IP ORAL PHASE 08/25/2016 Oral Phase Impaired  CHL IP PHARYNGEAL PHASE 08/25/2016 Pharyngeal Phase Impaired Pharyngeal- Thin Cup Delayed swallow initiation-vallecula Pharyngeal- Thin Straw Delayed swallow initiation-vallecula Pharyngeal- Puree Delayed swallow initiation-vallecula;Pharyngeal residue - valleculae  CHL IP CERVICAL ESOPHAGEAL PHASE 08/25/2016 Cervical Esophageal Phase Impaired - puree was noted to stop in upper esophagus and did not clear, despite liquid wash and 20+ minutes of time seated upright. Minimal amounts were noted to seep through intermittently. MD was called to notify. Celia B. Garrison, [facility], CCC-SLP [phone] Leigh Aurora 08/25/2016, 3:50 PM                Scheduled Meds: . acetaminophen  1,000 mg Oral TID  . cholecalciferol  1,000 Units  Oral Q2000  . DULoxetine  60 mg Oral Daily  . famotidine  20 mg Oral BID  . fluticasone furoate-vilanterol  1 puff Inhalation Daily  . furosemide  40 mg Intravenous Daily  . latanoprost  1 drop Both Eyes QHS  .  losartan  50 mg Oral Daily  . memantine  10 mg Oral BID  . metoprolol  100 mg Oral BID  . mirtazapine  15 mg Oral QHS  . potassium chloride  40 mEq Oral BID  . sulfacetamide  1 drop Right Eye QID  . tobramycin  1-2 drop Both Eyes QID   Continuous Infusions: . sodium chloride       LOS: 5 days     Pleas Koch, MD [facility] ([phone]  If 7PM-7AM, please contact night-coverage [email or web address] Password [email or web address] 08/27/2016, 11:09 AM

## 2016-08-27 NOTE — Interval H&P Note (Signed)
History and Physical Interval Note:  08/27/2016 12:28 PM  Carey BullocksAnn T Donn  has presented today for surgery, with the diagnosis of Abnormal esophagram and dysphagia  The various methods of treatment have been discussed with the patient and family. After consideration of risks, benefits and other options for treatment, the patient has consented to  Procedure(s): ESOPHAGOGASTRODUODENOSCOPY (EGD) (N/A) as a surgical intervention .  The patient's history has been reviewed, patient examined, no change in status, stable for surgery.  I have reviewed the patient's chart and labs.  Questions were answered to the patient's satisfaction.    Case reviewed with Dr. Elnoria HowardHung last evening. INR corrected. Upper endoscopy with possible esophageal dilation for problems with dysphagia planned today. Reviewed with daughter who accompanies patient to the endoscopy suite. Multiple questions answered to her satisfaction.   Yancey FlemingsJohn Jetta Murray

## 2016-08-27 NOTE — Progress Notes (Signed)
PT Cancellation Note  Patient Details Name: Kristin Wilkerson MRN: [id number] DOB: June 25, 1925   Cancelled Treatment:    Reason Eval/Treat Not Completed: Patient at procedure or test/unavailable   Sharen HeckHill, Arrie Borrelli Elizabeth Victorina Kable PT [phone]  08/27/2016, 12:38 PM

## 2016-08-27 NOTE — Op Note (Signed)
[facility] Patient Name: Kristin Wilkerson Procedure Date: 08/27/2016 MRN: [id number] Attending MD: Wilhemina Bonito. Marina Goodell , MD Date of Birth: January 23, 1926 CSN: [id number] Age: 83 Admit Type: Inpatient Procedure:                Upper GI endoscopy, with balloon dilation of                            esophagus Indications:              Dysphagia Providers:                Wilhemina Bonito. Marina Goodell, MD, Tomma Rakers, RN, Oletha Blend, Technician Referring MD:             [facility], Dr. Elnoria Howard Medicines:                Monitored Anesthesia Care, Midazolam 2 mg IV Complications:            No immediate complications. Estimated Blood Loss:     Estimated blood loss: none. Procedure:                Pre-Anesthesia Assessment:                           - Prior to the procedure, a History and Physical                            was performed, and patient medications and                            allergies were reviewed. The patient's tolerance of                            previous anesthesia was also reviewed. The risks                            and benefits of the procedure and the sedation                            options and risks were discussed with the patient.                            All questions were answered, and informed consent                            was obtained. Prior Anticoagulants: The patient has                            taken Coumadin (warfarin). INR corrected. ASA Grade                            Assessment: III - A patient with severe systemic  disease. After reviewing the risks and benefits,                            the patient was deemed in satisfactory condition to                            undergo the procedure.                           After obtaining informed consent, the endoscope was                            passed under direct vision. Throughout the                            procedure, the  patient's blood pressure, pulse, and                            oxygen saturations were monitored continuously. The                            [id number] ([id number]) scope was introduced through the                            mouth, and advanced to the second part of duodenum.                            The upper GI endoscopy was accomplished without                            difficulty. The patient tolerated the procedure                            well. Findings:      One moderate benign-appearing, intrinsic stenosis was found 35 cm from       the incisors. This measured 1.4 cm (inner diameter). There was also       associated esophagitis as manifested by edema and friability at the       mucosal Z line and stricture. A TTS dilator was passed through the       scope. Dilation with an 18-19-20 mm balloon dilator was performed to 19       mm. The dilation site was examined and showed complete resolution of       luminal narrowing, with ring disruption. Estimated blood loss was       minimal.      The exam of the esophagus was otherwise normal.      The stomach was normal, except for the presence of a 5 cm hiatal hernia.      The examined duodenum was normal.      The cardia and gastric fundus were normal on retroflexion. Impression:               - Esophageal stenoses. Dilated. Reflux esophagitis.                           - Normal  stomach, save hiatal hernia.                           - Normal examined duodenum.                           - No specimens collected. Moderate Sedation:      Moderate (conscious) sedation was administered by the endoscopy nurse       and supervised by the endoscopist. The following parameters were       monitored: oxygen saturation, heart rate, blood pressure, and response       to care. Total physician intraservice time was 15 minutes. Recommendation:           1. Mechanical soft diet                           2. Discontinue Pepcid and initiate pantoprazole 40                             mg daily                           3. From GI standpoint Okay to resume                            anticoagulation tomorrow, if medically necessary.                           4. GI follow-up as needed with Dr. Nicholes Mango. Case                            discussed with patient's daughter. Procedure report                            provided. Will sign off.                           . Procedure Code(s):        --- Professional ---                           [phone], Esophagogastroduodenoscopy, flexible,                            transoral; with transendoscopic balloon dilation of                            esophagus (less than 30 mm diameter)                           [id number], Moderate sedation services provided by the                            same physician or other qualified health care                            professional performing the diagnostic or  therapeutic service that the sedation supports,                            requiring the presence of an independent trained                            observer to assist in the monitoring of the                            patient's level of consciousness and physiological                            status; initial 15 minutes of intraservice time,                            patient age 36 years or older Diagnosis Code(s):        --- Professional ---                           K22.2, Esophageal obstruction                           R13.10, Dysphagia, unspecified CPT copyright 2016 [employer]. All rights reserved. The codes documented in this report are preliminary and upon coder review may  be revised to meet current compliance requirements. Wilhemina BonitoJohn N. Marina GoodellPerry, MD 08/27/2016 12:49:09 PM This report has been signed electronically. Number of Addenda: 0

## 2016-08-27 NOTE — H&P (View-Only) (Signed)
Subjective: No acute events.  Objective: Vital signs in last 24 hours: Temp:  [97.5 F (36.4 C)-98.2 F (36.8 C)] 98.2 F (36.8 C) (05/11 0536) Pulse Rate:  [70-85] 70 (05/11 0536) Resp:  [16] 16 (05/11 0536) BP: (115-137)/(65-95) 115/95 (05/11 0536) SpO2:  [92 %-96 %] 96 % (05/11 0536) Weight:  [77 kg (169 lb 12.1 oz)] 77 kg (169 lb 12.1 oz) (05/11 0536) Last BM Date: 08/25/16  Intake/Output from previous day: 05/10 0701 - 05/11 0700 In: 0  Out: 1200 [Urine:1200] Intake/Output this shift: No intake/output data recorded.  General appearance: alert GI: soft, non-tender; bowel sounds normal; no masses,  no organomegaly  Lab Results:  Recent Labs  08/24/16 0507 08/25/16 0448 08/26/16 0504  WBC 8.9 7.2 6.5  HGB 9.4* 9.8* 10.2*  HCT 30.8* 31.9* 34.2*  PLT 377 429* 493*   BMET  Recent Labs  08/24/16 0507 08/25/16 0448 08/26/16 0504  NA 139 140 140  K 2.8* 3.8 4.4  CL 100* 103 102  CO2 28 27 26   GLUCOSE 95 97 107*  BUN 28* 29* 25*  CREATININE 1.37* 0.97 0.90  CALCIUM 9.4 9.5 9.8   LFT No results for input(s): PROT, ALBUMIN, AST, ALT, ALKPHOS, BILITOT, BILIDIR, IBILI in the last 72 hours. PT/INR  Recent Labs  08/24/16 0948 08/25/16 0823  LABPROT 54.6* 45.6*  INR 5.90* 4.71*   Hepatitis Panel No results for input(s): HEPBSAG, HCVAB, HEPAIGM, HEPBIGM in the last 72 hours. C-Diff No results for input(s): CDIFFTOX in the last 72 hours. Fecal Lactopherrin No results for input(s): FECLLACTOFRN in the last 72 hours.  Studies/Results: Dg Chest Port 1 View  Result Date: 08/25/2016 CLINICAL DATA:  Pleural effusion. EXAM: PORTABLE CHEST 1 VIEW COMPARISON:  08/23/2016 FINDINGS: The patient is rotated to the right. The cardiomediastinal silhouette is unchanged. The lungs are hypoinflated. Pulmonary vascular congestion has decreased. Mild bibasilar opacities are stable to mildly improved and likely reflect atelectasis. No sizable pleural effusion or pneumothorax  is identified. IMPRESSION: Decreased pulmonary vascular congestion and mild bibasilar atelectasis. Electronically Signed   By: Sebastian Ache M.D.   On: 08/25/2016 07:21   Dg Swallowing Func-speech Pathology  Result Date: 08/25/2016 Objective Swallowing Evaluation: Type of Study: MBS-Modified Barium Swallow Study Patient Details Name: Kristin Wilkerson MRN: [id number] Date of Birth: [birth year] Today's Date: 08/25/2016 Time: SLP Start Time (ACUTE ONLY): 1245-SLP Stop Time (ACUTE ONLY): 1305 SLP Time Calculation (min) (ACUTE ONLY): 20 min Past Medical History: Past Medical History: Diagnosis Date . Anemia  . Anxiety  . CHF (congestive heart failure) (HCC)  . Dementia  . Hyperlipidemia  . Hypertension  Past Surgical History: Past Surgical History: Procedure Laterality Date . ABDOMINAL HYSTERECTOMY   . BILATERAL OOPHORECTOMY   . CARDIOVASCULAR STRESS TEST  11/16/2009  Perfusion defect seen in inferior myocardial region consistent with diaphragmatic attenuation. Remaining myocardium demonstrates normalmyocardial perfusion with no evidence of ischemia or infarct. No ECG changes. EKG negative for ischemia. . CHOLECYSTECTOMY   . I&D KNEE WITH POLY EXCHANGE Right 04/21/2015  Procedure: IRRIGATION AND DEBRIDEMENT RIGHT KNEE WITH PATELLECTOMY AND REPAIR OF COMPLEX OPEN WOUND;  Surgeon: Ranee Gosselin, MD;  Location: [facility];  Service: Orthopedics;  Laterality: Right; . I&D RIGHT KNEE WITH PATELLECTOMY  04/21/2015 . LUMBAR LAMINECTOMY  12/04/2012  Complete decompressive L3-4 and L4-5--Dr. Darrelyn Hillock . TOTAL KNEE ARTHROPLASTY Right 02/11/2015  Procedure: TOTAL KNEE ARTHROPLASTY;  Surgeon: Ranee Gosselin, MD;  Location: [facility];  Service: Orthopedics;  Laterality: Right; . TRANSTHORACIC ECHOCARDIOGRAM  09/01/2011  EF >55% and moderate tricuspid valve regurg. HPI: 83 yo pt admitted 08/21/16 with AMS - ? pna.  Pt was diagnosed with UTI, transferred to ICU due to respiratory difficulties.  Pt with h/o dementia and resides at facility, CHF, acute kidney  injury.  She has previously had a palliative meeting in 04/2015 when she had a knee wound.  Pt for swallow evaluation due to concerns for dysphagia. MBS ordered due to increased coughing.  Subjective: Pt seen in radiology for completion of MBS to objectively assess oropharyngeal swallow function and safety. Assessment / Plan / Recommendation CHL IP CLINICAL IMPRESSIONS 08/25/2016 Clinical Impression Pt presents with mild oropharyngeal dysphagia, characterized by poor bolus formation of puree, and delayed swallow reflex on both thin and puree consistencies. Trigger noted at the vallecula on both consistencies.  No penetration or aspiration, and no pharyngeal post-swallow residue was seen on any trial of thin liquid or puree consistency. Solid consistency was not tested, as the puree bolus was noted to have stopped in the upper esophagus. Intermittent visualization over the next 20 minutes revealed a minimal amount of the bolus had continued through the esophagus. Liquid wash was unsuccessful in clearing esophageal stasis. MBS was terminated at this time, and Dr. Mahala Menghini was called to discuss findings. At this point, recommend thin liquids (no solids) and consideration of GI consult to evaluate nature and extent of esophageal dysphagia. ST will follow up and proceed as appropriate, once esophageal issues have been addressed. SLP Visit Diagnosis Dysphagia, pharyngoesophageal phase (R13.14) Impact on safety and function Risk for inadequate nutrition/hydration;Severe aspiration risk   CHL IP TREATMENT RECOMMENDATION 08/25/2016 Treatment Recommendations Therapy as outlined in treatment plan below   Prognosis 08/25/2016 Prognosis for Safe Diet Advancement Fair Barriers to Reach Goals Severity of deficits CHL IP DIET RECOMMENDATION 08/25/2016 SLP Diet Recommendations Thin liquid Liquid Administration via Cup;Straw Medication Administration Per MD - both whole pills AND meds crushed in puree will not pass through the esophagus at  this point in time Compensations Minimize environmental distractions;Small sips/bites;Slow rate Postural Changes Seated upright at 90 degrees; Remain semi-upright after after feeds/meals   CHL IP OTHER RECOMMENDATIONS 08/25/2016 Recommended Consults Consider GI evaluation Oral Care Recommendations Oral care BID   CHL IP FOLLOW UP RECOMMENDATIONS 08/25/2016 Follow up Recommendations ST will follow up after GI consult   CHL IP FREQUENCY AND DURATION 08/25/2016 Speech Therapy Frequency (ACUTE ONLY) min 2x/week Treatment Duration 2 weeks      CHL IP ORAL PHASE 08/25/2016 Oral Phase Impaired  CHL IP PHARYNGEAL PHASE 08/25/2016 Pharyngeal Phase Impaired Pharyngeal- Thin Cup Delayed swallow initiation-vallecula Pharyngeal- Thin Straw Delayed swallow initiation-vallecula Pharyngeal- Puree Delayed swallow initiation-vallecula;Pharyngeal residue - valleculae  CHL IP CERVICAL ESOPHAGEAL PHASE 08/25/2016 Cervical Esophageal Phase Impaired - puree was noted to stop in upper esophagus and did not clear, despite liquid wash and 20+ minutes of time seated upright. Minimal amounts were noted to seep through intermittently. MD was called to notify. Celia B. [place], [facility], CCC-SLP [phone] Leigh Aurora 08/25/2016, 3:50 PM               Medications:  Scheduled: . acetaminophen  1,000 mg Oral TID  . cholecalciferol  1,000 Units Oral Q2000  . DULoxetine  60 mg Oral Daily  . famotidine  20 mg Oral BID  . fluticasone furoate-vilanterol  1 puff Inhalation Daily  . furosemide  40 mg Intravenous Daily  . latanoprost  1 drop Both Eyes QHS  . losartan  50 mg Oral Daily  .  memantine  10 mg Oral BID  . metoprolol  100 mg Oral BID  . mirtazapine  15 mg Oral QHS  . potassium chloride  40 mEq Oral BID  . sulfacetamide  1 drop Right Eye QID  . tobramycin  1-2 drop Both Eyes QID   Continuous: .  ceFAZolin (ANCEF) IV      Assessment/Plan: 1) Dysphagia. 2) Abnormal esophagram.   INR yesterday at 4.7.  Unable to pursue EGD today  as the intent was also to dilate for stricture or to dilate prophylactically.  No new INR this AM.  Plan: 1) Recheck INR today.  If 1.5 or below and EGD and be attempted later today or sometime this weekend.  I informed the daughter of the situation.   LOS: 4 days   Koi Zangara D 08/26/2016, 8:11 AM

## 2016-08-28 DIAGNOSIS — F329 Major depressive disorder, single episode, unspecified: Secondary | ICD-10-CM | POA: Diagnosis not present

## 2016-08-28 DIAGNOSIS — M1711 Unilateral primary osteoarthritis, right knee: Secondary | ICD-10-CM | POA: Diagnosis not present

## 2016-08-28 DIAGNOSIS — Z8673 Personal history of transient ischemic attack (TIA), and cerebral infarction without residual deficits: Secondary | ICD-10-CM | POA: Diagnosis not present

## 2016-08-28 DIAGNOSIS — G309 Alzheimer's disease, unspecified: Secondary | ICD-10-CM | POA: Diagnosis not present

## 2016-08-28 DIAGNOSIS — E119 Type 2 diabetes mellitus without complications: Secondary | ICD-10-CM | POA: Diagnosis not present

## 2016-08-28 DIAGNOSIS — J45909 Unspecified asthma, uncomplicated: Secondary | ICD-10-CM | POA: Diagnosis not present

## 2016-08-28 DIAGNOSIS — L299 Pruritus, unspecified: Secondary | ICD-10-CM | POA: Diagnosis not present

## 2016-08-28 DIAGNOSIS — K5900 Constipation, unspecified: Secondary | ICD-10-CM | POA: Diagnosis not present

## 2016-08-28 DIAGNOSIS — R7889 Finding of other specified substances, not normally found in blood: Secondary | ICD-10-CM | POA: Diagnosis not present

## 2016-08-28 DIAGNOSIS — I2699 Other pulmonary embolism without acute cor pulmonale: Secondary | ICD-10-CM | POA: Diagnosis not present

## 2016-08-28 DIAGNOSIS — F0391 Unspecified dementia with behavioral disturbance: Secondary | ICD-10-CM | POA: Diagnosis not present

## 2016-08-28 DIAGNOSIS — Z79899 Other long term (current) drug therapy: Secondary | ICD-10-CM | POA: Diagnosis not present

## 2016-08-28 DIAGNOSIS — K648 Other hemorrhoids: Secondary | ICD-10-CM | POA: Diagnosis not present

## 2016-08-28 DIAGNOSIS — L03818 Cellulitis of other sites: Secondary | ICD-10-CM | POA: Diagnosis not present

## 2016-08-28 DIAGNOSIS — H409 Unspecified glaucoma: Secondary | ICD-10-CM | POA: Diagnosis not present

## 2016-08-28 DIAGNOSIS — I10 Essential (primary) hypertension: Secondary | ICD-10-CM | POA: Diagnosis not present

## 2016-08-28 DIAGNOSIS — M1990 Unspecified osteoarthritis, unspecified site: Secondary | ICD-10-CM | POA: Diagnosis not present

## 2016-08-28 DIAGNOSIS — R0602 Shortness of breath: Secondary | ICD-10-CM | POA: Diagnosis not present

## 2016-08-28 DIAGNOSIS — J309 Allergic rhinitis, unspecified: Secondary | ICD-10-CM | POA: Diagnosis not present

## 2016-08-28 DIAGNOSIS — M25561 Pain in right knee: Secondary | ICD-10-CM | POA: Diagnosis not present

## 2016-08-28 DIAGNOSIS — R062 Wheezing: Secondary | ICD-10-CM | POA: Diagnosis not present

## 2016-08-28 DIAGNOSIS — R05 Cough: Secondary | ICD-10-CM | POA: Diagnosis not present

## 2016-08-28 DIAGNOSIS — I4891 Unspecified atrial fibrillation: Secondary | ICD-10-CM | POA: Diagnosis not present

## 2016-08-28 DIAGNOSIS — I5032 Chronic diastolic (congestive) heart failure: Secondary | ICD-10-CM | POA: Diagnosis not present

## 2016-08-28 DIAGNOSIS — R0782 Intercostal pain: Secondary | ICD-10-CM | POA: Diagnosis not present

## 2016-08-28 DIAGNOSIS — J329 Chronic sinusitis, unspecified: Secondary | ICD-10-CM | POA: Diagnosis not present

## 2016-08-28 DIAGNOSIS — E559 Vitamin D deficiency, unspecified: Secondary | ICD-10-CM | POA: Diagnosis not present

## 2016-08-28 DIAGNOSIS — R319 Hematuria, unspecified: Secondary | ICD-10-CM | POA: Diagnosis not present

## 2016-08-28 DIAGNOSIS — F0281 Dementia in other diseases classified elsewhere with behavioral disturbance: Secondary | ICD-10-CM | POA: Diagnosis not present

## 2016-08-28 DIAGNOSIS — K5730 Diverticulosis of large intestine without perforation or abscess without bleeding: Secondary | ICD-10-CM | POA: Diagnosis not present

## 2016-08-28 DIAGNOSIS — D649 Anemia, unspecified: Secondary | ICD-10-CM | POA: Diagnosis not present

## 2016-08-28 DIAGNOSIS — F419 Anxiety disorder, unspecified: Secondary | ICD-10-CM | POA: Diagnosis not present

## 2016-08-28 DIAGNOSIS — J189 Pneumonia, unspecified organism: Secondary | ICD-10-CM | POA: Diagnosis not present

## 2016-08-28 DIAGNOSIS — Z7901 Long term (current) use of anticoagulants: Secondary | ICD-10-CM | POA: Diagnosis not present

## 2016-08-28 DIAGNOSIS — I509 Heart failure, unspecified: Secondary | ICD-10-CM | POA: Diagnosis not present

## 2016-08-28 DIAGNOSIS — E785 Hyperlipidemia, unspecified: Secondary | ICD-10-CM | POA: Diagnosis not present

## 2016-08-28 DIAGNOSIS — I409 Acute myocarditis, unspecified: Secondary | ICD-10-CM | POA: Diagnosis not present

## 2016-08-28 DIAGNOSIS — R278 Other lack of coordination: Secondary | ICD-10-CM | POA: Diagnosis not present

## 2016-08-28 DIAGNOSIS — N390 Urinary tract infection, site not specified: Secondary | ICD-10-CM | POA: Diagnosis not present

## 2016-08-28 DIAGNOSIS — Z9181 History of falling: Secondary | ICD-10-CM | POA: Diagnosis not present

## 2016-08-28 DIAGNOSIS — M6281 Muscle weakness (generalized): Secondary | ICD-10-CM | POA: Diagnosis not present

## 2016-08-28 DIAGNOSIS — Z86718 Personal history of other venous thrombosis and embolism: Secondary | ICD-10-CM | POA: Diagnosis not present

## 2016-08-28 DIAGNOSIS — Z96651 Presence of right artificial knee joint: Secondary | ICD-10-CM | POA: Diagnosis not present

## 2016-08-28 DIAGNOSIS — R0989 Other specified symptoms and signs involving the circulatory and respiratory systems: Secondary | ICD-10-CM | POA: Diagnosis not present

## 2016-08-28 LAB — CBC
HEMATOCRIT: 40.2 % (ref 36.0–46.0)
Hemoglobin: 11.9 g/dL — ABNORMAL LOW (ref 12.0–15.0)
MCH: 24.4 pg — AB (ref 26.0–34.0)
MCHC: 29.6 g/dL — ABNORMAL LOW (ref 30.0–36.0)
MCV: 82.5 fL (ref 78.0–100.0)
PLATELETS: 476 10*3/uL — AB (ref 150–400)
RBC: 4.87 MIL/uL (ref 3.87–5.11)
RDW: 17.3 % — ABNORMAL HIGH (ref 11.5–15.5)
WBC: 9.6 10*3/uL (ref 4.0–10.5)

## 2016-08-28 LAB — BASIC METABOLIC PANEL
Anion gap: 13 (ref 5–15)
BUN: 29 mg/dL — ABNORMAL HIGH (ref 6–20)
CHLORIDE: 103 mmol/L (ref 101–111)
CO2: 24 mmol/L (ref 22–32)
CREATININE: 1.2 mg/dL — AB (ref 0.44–1.00)
Calcium: 10.4 mg/dL — ABNORMAL HIGH (ref 8.9–10.3)
GFR calc Af Amer: 45 mL/min — ABNORMAL LOW (ref >60)
GFR calc non Af Amer: 39 mL/min — ABNORMAL LOW (ref >60)
Glucose, Bld: 93 mg/dL (ref 65–99)
Potassium: 5.3 mmol/L — ABNORMAL HIGH (ref 3.5–5.1)
Sodium: 140 mmol/L (ref 135–145)

## 2016-08-28 NOTE — Discharge Summary (Signed)
Physician Discharge Summary  Kristin Wilkerson ZOX:[id number] DOB: 09-21-1925 DOA: 08/21/2016  PCP: Kimber Relic, MD  Admit date: 08/21/2016 Discharge date: 08/28/2016  Time spent: 40 minutes  Recommendations for Outpatient Follow-up:  1. Patient will need constant reorientation and discussion about dementia medications-we will need to also keep sleep wakeful cycles consistent 2. Recommend soft diet with dysphagia 2 diet and thin liquids for now and constant supervision with meals-speech therapy at facility to see the patient for diet recommendations going forward-should remain semiupright after meals 3. Patient does require further monitoring for infections going forward 4. Get a CBC and basic metabolic panel in about one week 5. Suggest discussion about Coumadin as an outpatient-is a reasonably high risk for falls if does ambulate and patient also might just benefit from aspirin for reduction of some risk of A. fib 6. Suggest 3 times a day PPI for the next 2 or 3 months.  Discharge Diagnoses:  Principal Problem:   Acute lower UTI Active Problems:   AKI (acute kidney injury) (HCC)   CAP (community acquired pneumonia)   Elevated INR   UTI (urinary tract infection)   Esophageal stricture   Gastroesophageal reflux disease with esophagitis   Discharge Condition: Improved-overall guarded  Diet recommendation: see above  Filed Weights   08/26/16 0536 08/27/16 0519 08/28/16 0500  Weight: 77 kg (169 lb 12.1 oz) 77 kg (169 lb 12.1 oz) 77 kg (169 lb 12.1 oz)    History of present illness:  36 female with a history of  Dementia-last MMSE 01/2016 was 24/30  hypertension. 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 Urinalysis significant for likely urinary tract infection.  Started on  ceftriaxone empirically. CT head unremarkable.  INR elevated secondary to chronic Coumadin use and has been held  Developed acute respiratory failure likely secondary to heart failure exacerbation requiring BiPAP for a very short period of time and weaning to room air.  Hospital Course:  E.coli Urinary tract infection-sens pending Gram negative rods on urine culture. Afebrile. Blood cultures NGTD since 08/22/16 ceftriaxone changed to ancef 5/10--stopped 5/11 given was rx for 7 days duration  Altered mental status with underlying mild-mod dementia  still not at baseline per daughter--re-viesion of patient status seems to show her sleep cycles are off.  More alert at night but confused during the days CT head negative. Blood cultures ngtd. orient clearly-open window, discourage daytime nap  ?pneumonia-Possible aspiration was ruled out Chest x-ray 5/10 shows atelectasis not significant for radiographic evidence of pneumonia, no wbc no fever initially covered for ASP PNA c Azithromycin--held on 5/9--have also d/c metronidazole for anaerobe coverage.  SLP consult recs MBS and NPO for now dr hung to scope am if inr below 1.5--given ffp and vit k 5.11--INR 1.3 at present As no PNa on xray encourage supportive management-stopped azithro and flagyll.  Acute respiratory failure Secondary to acute diastolic heart failure from IV fluids and holding of diuretics.  Required BiPAP transiently repeat echocardiogram 5/9=ef 60-65% + grd 1 DD wean to room air home Lasix--changed to lasix 40 daily IV to further diurese Daily weights Strict in and out--now - 200 cc  Acute kidney injury Thought to be secondary aggressive diuresis creatinins  Ranging 0/9--->1.3-->25/0.9 Hypokalemia is being replaced with Kdur 40 bid labs am  Coagulopathy-INR> 5 on admit History of  pulmonary embolism  Secondary to warfarin use. No bleeding. continue to hold warfarin recheck INR now 4.7 and is trending down from 8  on admit--now 1.3 Last admission had a long discussion about GOC and need for possible palliative involvement-was rec ASA only on d/c--will need revisit as OP  Chronic microcytic likely iron def anemia stable  Hypertension continue metoprolol 100 bid restart losartan 50 daily  Dementia/depression continue Namenda 10 daily continue Cymbalta 60 daily continue Remeron 15 hs Try to mminimize non-essential meds.     Procedures:  MBS  Consultations:  nad  Discharge Exam: Vitals:   08/27/16 2012 08/28/16 0522  BP: 121/62 (!) 144/51  Pulse: 73 60  Resp: 16 18  Temp: 97.4 F (36.3 C) 97.5 F (36.4 C)    General: alert but not completely oriented in nad Cardiovascular: s1 s2 no m/r/g Respiratory: clear no added sound abd soft nt nd   Discharge Instructions   Discharge Instructions    Diet - low sodium heart healthy    Complete by:  As directed    Increase activity slowly    Complete by:  As directed      Current Discharge Medication List    CONTINUE these medications which have NOT CHANGED   Details  acetaminophen (TYLENOL) 500 MG tablet Take 1,000 mg by mouth 3 (three) times daily.    albuterol (PROVENTIL) (2.5 MG/3ML) 0.083% nebulizer solution Take 2.5 mg by nebulization every 4 (four) hours as needed for wheezing or shortness of breath.    calcium carbonate (TUMS - DOSED IN MG ELEMENTAL CALCIUM) 500 MG chewable tablet Chew 2 tablets by mouth every 4 (four) hours as needed for indigestion or heartburn.    cholecalciferol (VITAMIN D) 1000 UNITS tablet Take 1,000 Units by mouth daily at 8 pm.     DULoxetine (CYMBALTA) 60 MG capsule Take 1 capsule (60 mg total) by mouth daily. One daily to help nerves Qty: 30 capsule, Refills: 6   Associated Diagnoses: Depression, major, recurrent, moderate (HCC)    fluticasone-salmeterol (ADVAIR HFA) 115-21 MCG/ACT inhaler Inhale 2 puffs into the lungs 2 (two) times daily.    furosemide (LASIX) 20 MG tablet Take 1 tablet  (20 mg total) by mouth every other day. Qty: 30 tablet, Refills: 5    guaiFENesin-dextromethorphan (ROBITUSSIN DM) 100-10 MG/5ML syrup Take 15 mLs by mouth every 4 (four) hours as needed for cough.    latanoprost (XALATAN) 0.005 % ophthalmic solution PLACE 1 DROP IN BOTH EYES AT BEDTIME TO TREAT GLAUCOMA Qty: 7.5 mL, Refills: 2    losartan (COZAAR) 100 MG tablet TAKE 1 TABLET BY MOUTH ONCE DAILY FOR BLOOD PRESSURE Qty: 90 tablet, Refills: 1    meclizine (ANTIVERT) 12.5 MG tablet Take 12.5 mg by mouth 3 (three) times daily as needed for nausea.     memantine (NAMENDA TITRATION PAK) tablet pack 5 mg/day for =1 week; 5 mg twice daily for =1 week; 15 mg/day given in 5 mg and 10 mg separated doses for =1 week; then 10 mg twice daily Qty: 49 tablet, Refills: 0    metoprolol (LOPRESSOR) 100 MG tablet TAKE 1 TABLET BY MOUTH TWICE DAILY TO CONTORL BLOOD PRESSURE Qty: 180 tablet, Refills: 1    mirtazapine (REMERON) 15 MG tablet Take 15 mg by mouth at bedtime.    ranitidine (ZANTAC) 150 MG tablet Take 150 mg by mouth 2 (two) times daily.    sennosides-docusate sodium (SENOKOT-S) 8.6-50 MG tablet Take 2 tablets by mouth daily as needed for  constipation.     sulfacetamide (BLEPH-10) 10 % ophthalmic solution Place 1 drop into the right eye 4 (four) times daily.    tobramycin (TOBREX) 0.3 % ophthalmic solution Place 1-2 drops into both eyes 4 (four) times daily.    warfarin (COUMADIN) 1 MG tablet Take 1-2 mg by mouth See admin instructions. Taking 1 mg on Mon, Wed, Fri  -- Taking 2 mg on Sun, Tue, Thurs, Sat daily at 4:30 pm       No Known Allergies Contact information for after-discharge care    Destination    [facility] .   Specialty:  Skilled Nursing Facility Contact information: [address] [place] [address] [phone]               The results of significant diagnostics from this hospitalization (including imaging, microbiology,  ancillary and laboratory) are listed below for reference.    Significant Diagnostic Studies: Ct Head Wo Contrast  Result Date: 08/22/2016 CLINICAL DATA:  Acute onset of altered mental status. Elevated INR. Initial encounter. EXAM: CT HEAD WITHOUT CONTRAST TECHNIQUE: Contiguous axial images were obtained from the base of the skull through the vertex without intravenous contrast. COMPARISON:  CT of the head performed 05/08/2015 FINDINGS: Brain: No evidence of acute infarction, hemorrhage, hydrocephalus, extra-axial collection or mass lesion/mass effect. Prominence of the ventricles and sulci reflects moderate cortical volume loss. Cerebellar atrophy is noted. Mild periventricular white matter change likely reflects small vessel ischemic microangiopathy. The brainstem and fourth ventricle are within normal limits. The basal ganglia are unremarkable in appearance. The cerebral hemispheres demonstrate grossly normal gray-white differentiation. No mass effect or midline shift is seen. Vascular: No hyperdense vessel or unexpected calcification. Skull: There is no evidence of fracture; visualized osseous structures are unremarkable in appearance. Sinuses/Orbits: The orbits are within normal limits. There is opacification of the right side of the sphenoid sinus. The remaining paranasal sinuses and mastoid air cells are well-aerated. Other: No significant soft tissue abnormalities are seen. IMPRESSION: 1. No acute intracranial pathology seen on CT. 2. Moderate cortical volume loss and scattered small vessel ischemic microangiopathy. Electronically Signed   By: Roanna Raider M.D.   On: 08/22/2016 02:00   Dg Chest Port 1 View  Result Date: 08/25/2016 CLINICAL DATA:  Pleural effusion. EXAM: PORTABLE CHEST 1 VIEW COMPARISON:  08/23/2016 FINDINGS: The patient is rotated to the right. The cardiomediastinal silhouette is unchanged. The lungs are hypoinflated. Pulmonary vascular congestion has decreased. Mild bibasilar  opacities are stable to mildly improved and likely reflect atelectasis. No sizable pleural effusion or pneumothorax is identified. IMPRESSION: Decreased pulmonary vascular congestion and mild bibasilar atelectasis. Electronically Signed   By: Sebastian Ache M.D.   On: 08/25/2016 07:21   Dg Chest Port 1 View  Result Date: 08/23/2016 CLINICAL DATA:  Increasing shortness of breath. Low oxygen saturations. EXAM: PORTABLE CHEST 1 VIEW COMPARISON:  08/21/2016 FINDINGS: Shallow inspiration with linear atelectasis in the lung bases. Cardiac enlargement. Increasing pulmonary vascularity since previous study. No definite edema or consolidation. No blunting of costophrenic angles. No pneumothorax. Tortuous aorta. Rotated patient positioning limits examination. IMPRESSION: Cardiac enlargement with developing pulmonary vascular congestion. Shallow inspiration with atelectasis in the lung bases. Electronically Signed   By: Burman Nieves M.D.   On: 08/23/2016 05:36   Dg Chest Port 1 View  Result Date: 08/22/2016 CLINICAL DATA:  Weakness, cough, shortness of breath, fever. Congestion. Hypertension. History of CHF and dementia. Recent incontinence EXAM: PORTABLE CHEST 1 VIEW COMPARISON:  05/08/2015 FINDINGS: Shallow inspiration with linear atelectasis in the lung bases. Mild cardiac enlargement. No vascular congestion or edema. No blunting of costophrenic angles. No pneumothorax. Tortuous aorta. Degenerative changes in the spine. IMPRESSION: Shallow inspiration with linear atelectasis in the lung bases. Cardiac enlargement. Electronically Signed   By: Burman NievesWilliam  Stevens M.D.   On: 08/22/2016 00:08   Dg Swallowing Func-speech Pathology  Result Date: 08/25/2016 Objective Swallowing Evaluation: Type of Study: MBS-Modified Barium Swallow Study Patient Details Name: Kristin Bullocksnn T Staub MRN: [id number] Date of Birth: 1925-08-04 Today's Date: 08/25/2016 Time: SLP Start Time (ACUTE ONLY): 1245-SLP Stop Time (ACUTE ONLY): 1305 SLP Time  Calculation (min) (ACUTE ONLY): 20 min Past Medical History: Past Medical History: Diagnosis Date . Anemia  . Anxiety  . CHF (congestive heart failure) (HCC)  . Dementia  . Hyperlipidemia  . Hypertension  Past Surgical History: Past Surgical History: Procedure Laterality Date . ABDOMINAL HYSTERECTOMY   . BILATERAL OOPHORECTOMY   . CARDIOVASCULAR STRESS TEST  11/16/2009  Perfusion defect seen in inferior myocardial region consistent with diaphragmatic attenuation. Remaining myocardium demonstrates normalmyocardial perfusion with no evidence of ischemia or infarct. No ECG changes. EKG negative for ischemia. . CHOLECYSTECTOMY   . I&D KNEE WITH POLY EXCHANGE Right 04/21/2015  Procedure: IRRIGATION AND DEBRIDEMENT RIGHT KNEE WITH PATELLECTOMY AND REPAIR OF COMPLEX OPEN WOUND;  Surgeon: Ranee Gosselinonald Gioffre, MD;  Location: [facility];  Service: Orthopedics;  Laterality: Right; . I&D RIGHT KNEE WITH PATELLECTOMY  04/21/2015 . LUMBAR LAMINECTOMY  12/04/2012  Complete decompressive L3-4 and L4-5--Dr. [name] . TOTAL KNEE ARTHROPLASTY Right 02/11/2015  Procedure: TOTAL KNEE ARTHROPLASTY;  Surgeon: Ranee Gosselinonald Gioffre, MD;  Location: [facility];  Service: Orthopedics;  Laterality: Right; . TRANSTHORACIC ECHOCARDIOGRAM  09/01/2011  EF >55% and moderate tricuspid valve regurg. HPI: 83 yo pt admitted 08/21/16 with AMS - ? pna.  Pt was diagnosed with UTI, transferred to ICU due to respiratory difficulties.  Pt with h/o dementia and resides at facility, CHF, acute kidney injury.  She has previously had a palliative meeting in 04/2015 when she had a knee wound.  Pt for swallow evaluation due to concerns for dysphagia. MBS ordered due to increased coughing.  Subjective: Pt seen in radiology for completion of MBS to objectively assess oropharyngeal swallow function and safety. Assessment / Plan / Recommendation CHL IP CLINICAL IMPRESSIONS 08/25/2016 Clinical Impression Pt presents with mild oropharyngeal dysphagia, characterized by poor bolus formation of puree,  and delayed swallow reflex on both thin and puree consistencies. Trigger noted at the vallecula on both consistencies.  No penetration or aspiration, and no pharyngeal post-swallow residue was seen on any trial of thin liquid or puree consistency. Solid consistency was not tested, as the puree bolus was noted to have stopped in the upper esophagus. Intermittent visualization over the next 20 minutes revealed a minimal amount of the bolus had continued through the esophagus. Liquid wash was unsuccessful in clearing esophageal stasis. MBS was terminated at this time, and Dr. Mahala MenghiniSamtani was called to discuss findings. At this point, recommend thin liquids (no solids) and consideration of GI consult to evaluate nature and extent of esophageal dysphagia. ST will follow up and proceed as appropriate, once esophageal issues have been addressed. SLP Visit Diagnosis Dysphagia, pharyngoesophageal phase (R13.14) Impact on safety and function Risk for inadequate nutrition/hydration;Severe aspiration risk   CHL IP TREATMENT RECOMMENDATION 08/25/2016 Treatment Recommendations Therapy as outlined in treatment plan below   Prognosis 08/25/2016 Prognosis for Safe Diet Advancement Fair Barriers to Reach Goals Severity of deficits CHL  IP DIET RECOMMENDATION 08/25/2016 SLP Diet Recommendations Thin liquid Liquid Administration via Cup;Straw Medication Administration Per MD - both whole pills AND meds crushed in puree will not pass through the esophagus at this point in time Compensations Minimize environmental distractions;Small sips/bites;Slow rate Postural Changes Seated upright at 90 degrees; Remain semi-upright after after feeds/meals   CHL IP OTHER RECOMMENDATIONS 08/25/2016 Recommended Consults Consider GI evaluation Oral Care Recommendations Oral care BID   CHL IP FOLLOW UP RECOMMENDATIONS 08/25/2016 Follow up Recommendations ST will follow up after GI consult   CHL IP FREQUENCY AND DURATION 08/25/2016 Speech Therapy Frequency (ACUTE  ONLY) min 2x/week Treatment Duration 2 weeks      CHL IP ORAL PHASE 08/25/2016 Oral Phase Impaired  CHL IP PHARYNGEAL PHASE 08/25/2016 Pharyngeal Phase Impaired Pharyngeal- Thin Cup Delayed swallow initiation-vallecula Pharyngeal- Thin Straw Delayed swallow initiation-vallecula Pharyngeal- Puree Delayed swallow initiation-vallecula;Pharyngeal residue - valleculae  CHL IP CERVICAL ESOPHAGEAL PHASE 08/25/2016 Cervical Esophageal Phase Impaired - puree was noted to stop in upper esophagus and did not clear, despite liquid wash and 20+ minutes of time seated upright. Minimal amounts were noted to seep through intermittently. MD was called to notify. Celia B. [place], [facility], CCC-SLP [phone] Leigh [place] 08/25/2016, 3:50 PM               Microbiology: Recent Results (from the past 240 hour(s))  Urine culture     Status: Abnormal   Collection Time: 08/22/16 12:20 AM  Result Value Ref Range Status   Specimen Description URINE, CATHETERIZED  Final   Special Requests Normal  Final   Culture >=100,000 COLONIES/mL ESCHERICHIA COLI (A)  Final   Report Status 08/24/2016 FINAL  Final   Organism ID, Bacteria ESCHERICHIA COLI (A)  Final      Susceptibility   Escherichia coli - MIC*    AMPICILLIN >=32 RESISTANT Resistant     CEFAZOLIN <=4 SENSITIVE Sensitive     CEFTRIAXONE <=1 SENSITIVE Sensitive     CIPROFLOXACIN >=4 RESISTANT Resistant     GENTAMICIN <=1 SENSITIVE Sensitive     IMIPENEM <=0.25 SENSITIVE Sensitive     NITROFURANTOIN <=16 SENSITIVE Sensitive     TRIMETH/SULFA <=20 SENSITIVE Sensitive     AMPICILLIN/SULBACTAM >=32 RESISTANT Resistant     PIP/TAZO <=4 SENSITIVE Sensitive     Extended ESBL NEGATIVE Sensitive     * >=100,000 COLONIES/mL ESCHERICHIA COLI  Culture, blood (routine x 2)     Status: None   Collection Time: 08/22/16 12:21 AM  Result Value Ref Range Status   Specimen Description BLOOD LEFT ANTECUBITAL  Final   Special Requests   Final    BOTTLES DRAWN AEROBIC AND ANAEROBIC  Blood Culture adequate volume   Culture   Final    NO GROWTH 5 DAYS Performed at [facility], [address] [place], [place] [address]    Report Status 08/27/2016 FINAL  Final  Culture, blood (routine x 2)     Status: None   Collection Time: 08/22/16 12:31 AM  Result Value Ref Range Status   Specimen Description BLOOD BLOOD RIGHT FOREARM  Final   Special Requests IN PEDIATRIC BOTTLE Blood Culture adequate volume  Final   Culture   Final    NO GROWTH 5 DAYS Performed at [facility], [address], [place], [place] [address]    Report Status 08/27/2016 FINAL  Final  MRSA PCR Screening     Status: None   Collection Time: 08/22/16  4:00 AM  Result Value  Ref Range Status   MRSA by PCR NEGATIVE NEGATIVE Final    Comment:        The GeneXpert MRSA Assay (FDA approved for NASAL specimens only), is one component of a comprehensive MRSA colonization surveillance program. It is not intended to diagnose MRSA infection nor to guide or monitor treatment for MRSA infections.      Labs: Basic Metabolic Panel:  Recent Labs Lab 08/23/16 0809 08/24/16 0507 08/25/16 0448 08/26/16 0504 08/28/16 0559  NA 142 139 140 140 140  K 3.3* 2.8* 3.8 4.4 5.3*  CL 102 100* 103 102 103  CO2 25 28 27 26 24   GLUCOSE 112* 95 97 107* 93  BUN 19 28* 29* 25* 29*  CREATININE 0.83 1.37* 0.97 0.90 1.20*  CALCIUM 9.2 9.4 9.5 9.8 10.4*   Liver Function Tests:  Recent Labs Lab 08/22/16 0020  AST 26  ALT 23  ALKPHOS 126  BILITOT 0.4  PROT 8.6*  ALBUMIN 3.2*   No results for input(s): LIPASE, AMYLASE in the last 168 hours. No results for input(s): AMMONIA in the last 168 hours. CBC:  Recent Labs Lab 08/24/16 0507 08/25/16 0448 08/26/16 0504 08/27/16 0506 08/28/16 0559  WBC 8.9 7.2 6.5 7.4 9.6  NEUTROABS  --   --  3.4  --   --   HGB 9.4* 9.8* 10.2* 10.5* 11.9*  HCT 30.8* 31.9* 34.2* 34.3* 40.2  MCV 80.2 80.6 81.2 80.9 82.5  PLT 377 429* 493* 470* 476*   Cardiac  Enzymes:  Recent Labs Lab 08/22/16 0020  TROPONINI <0.03   BNP: BNP (last 3 results)  Recent Labs  08/22/16 0021  BNP 134.9*    ProBNP (last 3 results) No results for input(s): PROBNP in the last 8760 hours.  CBG:  Recent Labs Lab 08/26/16 0757  GLUCAP 105*       Signed:  Rhetta Mura MD   [facility] 08/28/2016, 10:15 AM

## 2016-08-28 NOTE — Progress Notes (Signed)
[facility] Condition [facility]. Awaiting [facility] for transport. Melton Alarana A Ivone Licht, RN

## 2016-08-28 NOTE — Clinical Social Work Note (Signed)
LCSW called and spoke with RN Kim at [facility] skilled [facility] facility to facilitate discharge.  Facility stated that they would need to call in someone to be able to take patient back in today stating that no one on today knows how to "key patient back into computer".  LCSW met with patient and her two children at bedside to facilitate discharge.  Patient will be transported via [facility], awaiting call back from Kim at [facility].   . , LCSW [facility] Clinical Social Worker - Weekend Coverage cell #: [phone]   

## 2016-08-28 NOTE — Evaluation (Signed)
Physical Therapy Evaluation Patient Details Name: Kristin Wilkerson MRN: [id number] DOB: November 18, 1925 Today's Date: 08/28/2016   History of Present Illness  HPI: Kristin Wilkerson is a 83 y.o. female with medical history significant of Dementia, HTN, R TKA with polyexchange.  Patient brought in by daughter from SNF with increased cough, URI, generalized weakness. Dx of UTI, AKI, elevated INR, CAP.   Clinical Impression  Pt admitted with above diagnosis. Pt currently with functional limitations due to the deficits listed below (see PT Problem List). Per chart, pt is WC bound at SNF. Min A to transfer to recliner. Due to poor cognition, pt not able to provide details of prior functional level.   Pt will benefit from skilled PT to increase their independence and safety with mobility to allow discharge to the venue listed below.       Follow Up Recommendations SNF    Equipment Recommendations  None recommended by PT    Recommendations for Other Services       Precautions / Restrictions Precautions Precautions: Fall Restrictions Weight Bearing Restrictions: No      Mobility  Bed Mobility Overal bed mobility: Needs Assistance Bed Mobility: Supine to Sit     Supine to sit: Min assist     General bed mobility comments: assist to raise trunk and scoot hips to EOB  Transfers Overall transfer level: Needs assistance   Transfers: Stand Pivot Transfers   Stand pivot transfers: Min assist       General transfer comment: min A to rise, steady, and pivot  Ambulation/Gait             General Gait Details: deferred, WC bound per chart  Stairs            Wheelchair Mobility    Modified Rankin (Stroke Patients Only)       Balance Overall balance assessment: Needs assistance   Sitting balance-Leahy Scale: Fair       Standing balance-Leahy Scale: Poor                               Pertinent Vitals/Pain Faces Pain Scale: No hurt    Home Living  Family/patient expects to be discharged to:: Skilled nursing facility                 Additional Comments: from SNF, returning to SNF    Prior Function Level of Independence: Needs assistance   Gait / Transfers Assistance Needed: [facility]     Comments: unsure, pt unable to provide info 2* poor cognition     Hand Dominance   Dominant Hand: Right    Extremity/Trunk Assessment        Lower Extremity Assessment Lower Extremity Assessment: RLE deficits/detail RLE Deficits / Details: pt reports she can't walk bc RLE is "bad", noted prior TKA with poly exchange per chart       Communication   Communication: HOH  Cognition Arousal/Alertness: Awake/alert Behavior During Therapy: WFL for tasks assessed/performed Overall Cognitive Status: No family/caregiver present to determine baseline cognitive functioning (not able to provide hx, not oriented)                                 General Comments: can follow 1 step commands      General Comments      Exercises     Assessment/Plan  PT Assessment Patient needs continued PT services  PT Problem List Decreased activity tolerance;Decreased balance;Decreased mobility       PT Treatment Interventions      PT Goals (Current goals can be found in the Care Plan section)  Acute Rehab PT Goals PT Goal Formulation: Patient unable to participate in goal setting Time For Goal Achievement: 09/11/16 Potential to Achieve Goals: Fair    Frequency Min 2X/week   Barriers to discharge        Co-evaluation               AM-PAC PT "6 Clicks" Daily Activity  Outcome Measure Difficulty turning over in bed (including adjusting bedclothes, sheets and blankets)?: A Little Difficulty moving from lying on back to sitting on the side of the bed? : A Little Difficulty sitting down on and standing up from a chair with arms (e.g., wheelchair, bedside commode, etc,.)?: A Little Help needed moving  to and from a bed to chair (including a wheelchair)?: A Little Help needed walking in hospital room?: Total Help needed climbing 3-5 steps with a railing? : Total 6 Click Score: 14    End of Session Equipment Utilized During Treatment: Gait belt Activity Tolerance: Patient tolerated treatment well;No increased pain Patient left: in chair;with call bell/phone within reach;with chair alarm set Nurse Communication: Mobility status PT Visit Diagnosis: Muscle weakness (generalized) (M62.81)    Time: 1610-9604 PT Time Calculation (min) (ACUTE ONLY): 19 min   Charges:   PT Evaluation $PT Eval Low Complexity: 1 Procedure     PT G Codes:          Tamala Ser 08/28/2016, 10:57 AM [phone]

## 2016-08-28 NOTE — Clinical Social Work Note (Signed)
LCSW called [facility] for [facility]  [name].Elray Bubaegina Ekaterini Capitano, LCSW [facility] Clinical Social Worker - Weekend Coverage cell #: [phone]

## 2016-08-28 NOTE — NC FL2 (Signed)
[place] MEDICAID FL2 LEVEL OF CARE SCREENING TOOL     IDENTIFICATION  Patient Name: Kristin Bullocksnn T Royce Birthdate: 1925/06/11 Sex: female Admission Date (Current Location): 08/21/2016  [facility] [place]   Facility and Address:  [facility],  [address], [place] [id number]      Provider Number: [id number]  Attending Physician Name and Address:  Rhetta MuraSamtani, Jai-Gurmukh, MD  Relative Name and Phone Number:       Current Level of Care: Hospital Recommended Level of Care: Skilled Nursing Facility Prior Approval Number:    Date Approved/Denied:   PASRR Number:    Discharge Plan: SNF    Current Diagnoses: Patient Active Problem List   Diagnosis Date Noted  . Esophageal stricture   . Gastroesophageal reflux disease with esophagitis   . AKI (acute kidney injury) (HCC) 08/22/2016  . CAP (community acquired pneumonia) 08/22/2016  . Elevated INR 08/22/2016  . Acute lower UTI 08/22/2016  . UTI (urinary tract infection) 08/22/2016  . Pulmonary embolism (HCC) 05/08/2015  . Anxiety and depression 05/08/2015  . Chronic diastolic CHF (congestive heart failure), NYHA class 1 (HCC) 05/08/2015  . Benign essential HTN 05/08/2015  . Dyslipidemia 05/08/2015  . Normocytic anemia 05/08/2015  . Syncope 05/08/2015  . DNR (do not resuscitate)   . Palliative care encounter   . Pulmonary embolism, bilateral (HCC)   . Dementia 05/01/2015    Orientation RESPIRATION BLADDER Height & Weight     Self  Normal Incontinent Weight: 169 lb 12.1 oz (77 kg) Height:  5\' 7"  (170.2 cm)  BEHAVIORAL SYMPTOMS/MOOD NEUROLOGICAL BOWEL NUTRITION STATUS      Incontinent Diet (Heart)  AMBULATORY STATUS COMMUNICATION OF NEEDS Skin   Extensive Assist Verbally Normal                       Personal Care Assistance Level of Assistance  Bathing, Feeding, Dressing, Total care Bathing Assistance: Maximum assistance Feeding assistance: Maximum assistance Dressing Assistance:  Maximum assistance Total Care Assistance: Maximum assistance   Functional Limitations Info             SPECIAL CARE FACTORS FREQUENCY                       Contractures Contractures Info: Not present    Additional Factors Info  Code Status, Allergies Code Status Info: DNR Allergies Info: NKA           Current Medications (08/28/2016):  This is the current hospital active medication list Current Facility-Administered Medications  Medication Dose Route Frequency Provider Last Rate Last Dose  . acetaminophen (TYLENOL) tablet 1,000 mg  1,000 mg Oral TID Hillary BowGardner, Jared M, DO   1,000 mg at 08/28/16 1116  . albuterol (PROVENTIL) (2.5 MG/3ML) 0.083% nebulizer solution 2.5 mg  2.5 mg Nebulization Q4H PRN Hillary BowGardner, Jared M, DO   2.5 mg at 08/23/16 0410  . DULoxetine (CYMBALTA) DR capsule 60 mg  60 mg Oral Daily Lyda PeroneGardner, Jared M, DO   60 mg at 08/28/16 1118  . fluticasone furoate-vilanterol (BREO ELLIPTA) 200-25 MCG/INH 1 puff  1 puff Inhalation Daily Lyda PeroneGardner, Jared M, DO   1 puff at 08/28/16 0838  . furosemide (LASIX) injection 40 mg  40 mg Intravenous Daily Rhetta MuraSamtani, Jai-Gurmukh, MD   40 mg at 08/28/16 1118  . guaiFENesin-dextromethorphan (ROBITUSSIN DM) 100-10 MG/5ML syrup 15 mL  15 mL Oral Q4H PRN Hillary BowGardner, Jared M, DO   15 mL at 08/27/16 1431  .  latanoprost (XALATAN) 0.005 % ophthalmic solution 1 drop  1 drop Both Eyes QHS Lyda Perone M, DO   1 drop at 08/27/16 2154  . LORazepam (ATIVAN) injection 0.5 mg  0.5 mg Intravenous Q4H PRN Bobette Mo, MD   0.5 mg at 08/26/16 2036  . losartan (COZAAR) tablet 50 mg  50 mg Oral Daily Narda Bonds, MD   50 mg at 08/28/16 1117  . memantine (NAMENDA) tablet 10 mg  10 mg Oral BID Lyda Perone M, DO   10 mg at 08/28/16 1116  . metoprolol tartrate (LOPRESSOR) tablet 100 mg  100 mg Oral BID Lyda Perone M, DO   100 mg at 08/28/16 1117  . mirtazapine (REMERON) tablet 15 mg  15 mg Oral QHS Lyda Perone M, DO   15 mg at 08/27/16  2156  . pantoprazole (PROTONIX) EC tablet 40 mg  40 mg Oral Q0600 Hilarie Fredrickson, MD   40 mg at 08/28/16 1610  . potassium chloride SA (K-DUR,KLOR-CON) CR tablet 40 mEq  40 mEq Oral BID Rhetta Mura, MD   40 mEq at 08/28/16 1116  . senna-docusate (Senokot-S) tablet 2 tablet  2 tablet Oral Daily PRN Hillary Bow, DO      . sulfacetamide (BLEPH-10) 10 % ophthalmic solution 1 drop  1 drop Right Eye QID Lyda Perone M, DO   1 drop at 08/27/16 2155  . tobramycin (TOBREX) 0.3 % ophthalmic solution 1-2 drop  1-2 drop Both Eyes QID Hillary Bow, DO   1 drop at 08/27/16 2154     Discharge Medications: Please see discharge summary for a list of discharge medications.  Relevant Imaging Results:  Relevant Lab Results:   Additional Information SSN [id number]  Annetta Maw, LCSW

## 2016-08-29 ENCOUNTER — Encounter (HOSPITAL_COMMUNITY): Payer: Self-pay | Admitting: Internal Medicine

## 2016-10-25 DIAGNOSIS — F0391 Unspecified dementia with behavioral disturbance: Secondary | ICD-10-CM | POA: Diagnosis not present

## 2016-10-25 DIAGNOSIS — I5032 Chronic diastolic (congestive) heart failure: Secondary | ICD-10-CM | POA: Diagnosis not present

## 2016-10-25 DIAGNOSIS — M1990 Unspecified osteoarthritis, unspecified site: Secondary | ICD-10-CM | POA: Diagnosis not present

## 2016-10-25 DIAGNOSIS — J45909 Unspecified asthma, uncomplicated: Secondary | ICD-10-CM | POA: Diagnosis not present

## 2017-01-12 DIAGNOSIS — Z86718 Personal history of other venous thrombosis and embolism: Secondary | ICD-10-CM | POA: Diagnosis not present

## 2017-01-12 DIAGNOSIS — Z96651 Presence of right artificial knee joint: Secondary | ICD-10-CM | POA: Diagnosis not present

## 2017-01-12 DIAGNOSIS — I5032 Chronic diastolic (congestive) heart failure: Secondary | ICD-10-CM | POA: Diagnosis not present

## 2017-01-12 DIAGNOSIS — I2699 Other pulmonary embolism without acute cor pulmonale: Secondary | ICD-10-CM | POA: Diagnosis not present

## 2017-02-01 ENCOUNTER — Ambulatory Visit: Payer: Medicare Other | Admitting: Neurology

## 2017-03-14 ENCOUNTER — Encounter: Payer: Self-pay | Admitting: Neurology

## 2017-03-14 ENCOUNTER — Ambulatory Visit (INDEPENDENT_AMBULATORY_CARE_PROVIDER_SITE_OTHER): Payer: Medicare Other | Admitting: Neurology

## 2017-03-14 VITALS — BP 160/78 | HR 77 | Ht 62.0 in | Wt 179.0 lb

## 2017-03-14 DIAGNOSIS — G309 Alzheimer's disease, unspecified: Secondary | ICD-10-CM | POA: Diagnosis not present

## 2017-03-14 DIAGNOSIS — F0281 Dementia in other diseases classified elsewhere with behavioral disturbance: Secondary | ICD-10-CM

## 2017-03-14 MED ORDER — MEMANTINE HCL 10 MG PO TABS: 10.0000 mg | ORAL_TABLET | Freq: Two times a day (BID) | ORAL | 4 refills | Status: AC

## 2017-03-14 NOTE — Progress Notes (Signed)
PATIENT: Kristin Wilkerson DOB: 1926-03-18  REASON FOR VISIT: follow up- memory disturbance, stroke HISTORY FROM: patient  HISTORY OF PRESENT ILLNES:   HISTORY 05/01/15 (MM): Kristin Wilkerson is 83 years old right-handed female, accompanied by her daughter, and son at today's clinical visit, she was a patient of Dr. Pearlean Brownie for stroke, last clinical visit was in April 2016  She had a history of hypertension, hyperlipidemia, suffered a right occipital stroke in November 2014, patient was on aspirin 81 mg daily when the stroke happened, I have personally reviewed MRI of brain in November 2014, right occipital acute DWI relation, moderate generalized atrophy, MRA of the brain showed some irregularity at intra-cranial vessels, related intracranial atherosclerotic disease, no large vessel disease. Echocardiogram showed ejection fraction 55-60%, no cardioembolic source, ultrasound of carotid artery showed 1-39% bilateral internal carotid artery stenosis. She was discharged on Plavix,  She used to live at home, has mild baseline memory trouble, but still highly functional, she underwent right knee replacement in February 11 2015, was discharged to [facility] rehabilitation, she fell multiple times, injured her right knee, has to second right knee surgery in January fifth 2017, she is now at different facility[facility] [place], she is no longer ambulatory, wheelchair bound.   During the process, she was found to have increased confusion, anxiety, develop visual hallucinations, she saw her sister who passed away 5 years ago, she has been taking Cymbalta to 30 mg daily for many years, she seems to respond to low-dose Xanax for a while, her hospital course was also complicated by UTI, pneumonia.  Her sons are in the process of finalizing her power of attorney paperwork, she has DO NOT RESUSCITATE order on file, she has good appetite, increased agitation confusion and hallucination at evening  time.  UPDATE Jan 28 2016; [facility],  I reviewed her laboratory evaluation in July 2017, mild anemia with hemoglobin of 10 point 9, low MCH 20 4.9, RDW 15 point 5, INR was elevated 2.7,  she is on chronic Coumadin treatment,  I also reviewed her medication list, she is DO NOT RESUSCITATE, taking Cymbalta 60 mg, Lasix 20 mg, losartan 50 mg, prostat 30 cc, Lopressor 100 mg twice a day, Namenda in 10 mg twice a day, vitamin D supplement mirtazapine 15 mg at bedtime Xanax as needed, as needed, Tylenol 500mg  2 tab three times a day.  She participates in activities occasionally, spend most of time watching TV. It is hard for her to read now.  UPDATE Mar 14 2017: She has increased memory loss, confusion, at night, she could be irritable, she eats well, sleeps well,   REVIEW OF SYSTEMS: Out of a complete 14 system review of symptoms, the patient complains only of the following symptoms, and all other reviewed systems are negative. Right leg swelling, right knee pain   ALLERGIES: No Known Allergies  HOME MEDICATIONS: Outpatient Medications Prior to Visit  Medication Sig Dispense Refill  . acetaminophen (TYLENOL) 500 MG tablet Take 1,000 mg by mouth 3 (three) times daily.    [name] albuterol (PROVENTIL) (2.5 MG/3ML) 0.083% nebulizer solution Take 2.5 mg by nebulization every 4 (four) hours as needed for wheezing or shortness of breath.    . calcium carbonate (TUMS - DOSED IN MG ELEMENTAL CALCIUM) 500 MG chewable tablet Chew 2 tablets by mouth every 4 (four) hours as needed for indigestion or heartburn.    . cholecalciferol (VITAMIN D) 1000 UNITS tablet Take  1,000 Units by mouth daily at 8 pm.     . DULoxetine (CYMBALTA) 60 MG capsule Take 1 capsule (60 mg total) by mouth daily. One daily to help nerves 30 capsule 6  . fluticasone-salmeterol (ADVAIR HFA) 115-21 MCG/ACT inhaler Inhale 2 puffs into the lungs 2 (two) times daily.    . furosemide (LASIX) 20 MG  tablet Take 1 tablet (20 mg total) by mouth every other day. (Patient taking differently: Take 40 mg by mouth 2 (two) times daily. ) 30 tablet 5  . guaiFENesin-dextromethorphan (ROBITUSSIN DM) 100-10 MG/5ML syrup Take 15 mLs by mouth every 4 (four) hours as needed for cough.    . latanoprost (XALATAN) 0.005 % ophthalmic solution PLACE 1 DROP IN BOTH EYES AT BEDTIME TO TREAT GLAUCOMA 7.5 mL 2  . losartan (COZAAR) 100 MG tablet TAKE 1 TABLET BY MOUTH ONCE DAILY FOR BLOOD PRESSURE (Patient taking differently: TAKE 50 MG BY MOUTH ONCE DAILY FOR BLOOD PRESSURE) 90 tablet 1  . meclizine (ANTIVERT) 12.5 MG tablet Take 12.5 mg by mouth 3 (three) times daily as needed for nausea.     . memantine (NAMENDA TITRATION PAK) tablet pack 5 mg/day for =1 week; 5 mg twice daily for =1 week; 15 mg/day given in 5 mg and 10 mg separated doses for =1 week; then 10 mg twice daily (Patient taking differently: Take 10 mg by mouth 2 (two) times daily. ) 49 tablet 0  . metoprolol (LOPRESSOR) 100 MG tablet TAKE 1 TABLET BY MOUTH TWICE DAILY TO CONTORL BLOOD PRESSURE (Patient taking differently: TAKE 100 MG BY MOUTH TWICE DAILY TO CONTORL BLOOD PRESSURE) 180 tablet 1  . mirtazapine (REMERON) 15 MG tablet Take 15 mg by mouth at bedtime.    . ranitidine (ZANTAC) 150 MG tablet Take 150 mg by mouth 2 (two) times daily.    . sennosides-docusate sodium (SENOKOT-S) 8.6-50 MG tablet Take 2 tablets by mouth daily as needed for constipation.     . sulfacetamide (BLEPH-10) 10 % ophthalmic solution Place 1 drop into the right eye 4 (four) times daily.    [name]. tobramycin (TOBREX) 0.3 % ophthalmic solution Place 1-2 drops into both eyes 4 (four) times daily.    [name]. warfarin (COUMADIN) 1 MG tablet Take 1-2 mg by mouth See admin instructions. Taking 1 mg on Mon, Wed, Fri  -- Taking 2 mg on Sun, Tue, Thurs, Sat daily at 4:30 pm     No facility-administered medications prior to visit.     PAST MEDICAL HISTORY: Past Medical History:  Diagnosis Date   . Anemia   . Anxiety   . CHF (congestive heart failure) (HCC)   . Dementia   . Hyperlipidemia   . Hypertension     PAST SURGICAL HISTORY: Past Surgical History:  Procedure Laterality Date  . ABDOMINAL HYSTERECTOMY    . BILATERAL OOPHORECTOMY    . CARDIOVASCULAR STRESS TEST  11/16/2009   Perfusion defect seen in inferior myocardial region consistent with diaphragmatic attenuation. Remaining myocardium demonstrates normalmyocardial perfusion with no evidence of ischemia or infarct. No ECG changes. EKG negative for ischemia.  . CHOLECYSTECTOMY    . ESOPHAGOGASTRODUODENOSCOPY N/A 08/27/2016   Procedure: ESOPHAGOGASTRODUODENOSCOPY (EGD);  Surgeon: Hilarie FredricksonPerry, John N, MD;  [facility];  Service: Endoscopy;  Laterality: N/A;  . I&D KNEE WITH POLY EXCHANGE Right 04/21/2015   Procedure: IRRIGATION AND DEBRIDEMENT RIGHT KNEE WITH PATELLECTOMY AND REPAIR OF COMPLEX OPEN WOUND;  Surgeon: Ranee Gosselinonald Gioffre, MD;  Location: [facility];  Service: Orthopedics;  Laterality: Right;  .  I&D RIGHT KNEE WITH PATELLECTOMY  04/21/2015  . LUMBAR LAMINECTOMY  12/04/2012   Complete decompressive L3-4 and L4-5--Dr. Darrelyn Hillock  . TOTAL KNEE ARTHROPLASTY Right 02/11/2015   Procedure: TOTAL KNEE ARTHROPLASTY;  Surgeon: Ranee Gosselin, MD;  Location: [facility];  Service: Orthopedics;  Laterality: Right;  . TRANSTHORACIC ECHOCARDIOGRAM  09/01/2011   EF >55% and moderate tricuspid valve regurg.    FAMILY HISTORY: Family History  Problem Relation Age of Onset  . Lung cancer Sister   . Stroke Mother   . Stroke Father     SOCIAL HISTORY: Social History   Socioeconomic History  . Marital status: Widowed    Spouse name: Not on file  . Number of children: 3  . Years of education: 12th  . Highest education level: Not on file  Social Needs  . Financial resource strain: Not on file  . Food insecurity - worry: Not on file  . Food insecurity - inability: Not on file  . Transportation needs - medical: Not on file  .  Transportation needs - non-medical: Not on file  Occupational History  . Occupation: retired  Tobacco Use  . Smoking status: Never Smoker  . Smokeless tobacco: Never Used  Substance and Sexual Activity  . Alcohol use: No  . Drug use: No  . Sexual activity: No  Other Topics Concern  . Not on file  Social History Narrative  . Not on file      PHYSICAL EXAM  Vitals:   03/14/17 1109  BP: (!) 160/78  Pulse: 77  Weight: 179 lb (81.2 kg)  Height: 5\' 2"  (1.575 m)   Body mass index is 32.74 kg/m.   MMSE - Mini Mental State Exam 03/14/2017 01/28/2016 07/30/2015  Orientation to time 2 3 1   Orientation to Place 0 2 2  Registration 3 3 3   Attention/ Calculation 5 5 2   Recall 0 2 0  Language- name 2 objects 2 2 1   Language- repeat 1 0 0  Language- follow 3 step command 3 3 3   Language- read & follow direction 1 1 1   Write a sentence 1 1 1   Copy design 0 1 0  Total score 18 23 14    Animal naming 4. PHYSICAL EXAMNIATION:  Gen: NAD, conversant, well nourised, obese, well groomed                     Cardiovascular: Regular rate rhythm, no peripheral edema, warm, nontender. Eyes: Conjunctivae clear without exudates or hemorrhage Neck: Supple, no carotid bruise. Pulmonary: Clear to auscultation bilaterally     CRANIAL NERVES: CN II: Visual fields are full to confrontation. Fundoscopic exam is normal with sharp discs and no vascular changes. Pupils are round equal and briskly reactive to light. CN III, IV, VI: extraocular movement are normal. No ptosis. CN V: Facial sensation is intact to pinprick in all 3 divisions bilaterally. Corneal responses are intact.  CN VII: Face is symmetric with normal eye closure and smile. CN VIII: Hearing is normal to rubbing fingers CN IX, X: Palate elevates symmetrically. Phonation is normal. CN XI: Head turning and shoulder shrug are intact CN XII: Tongue is midline with normal movements and no atrophy.  MOTOR: Bilateral upper extremity  motor strength is normal Right lower extremity examination is limited because of the right knee pain, right knee brace, with right foot swelling  REFLEXES: Reflexes are hypoactive and symmetric. Plantar responses are flexor.  SENSORY: Intact to light touch, pinprick,  COORDINATION: Rapid alternating  movements and fine finger movements are intact. There is no dysmetria on finger-to-nose and heel-knee-shin.    GAIT/STANCE: Deferred    DIAGNOSTIC DATA (LABS, IMAGING, TESTING) - I reviewed patient records, labs, notes, testing and imaging myself where available.  Lab Results  Component Value Date   WBC 9.6 08/28/2016   HGB 11.9 (L) 08/28/2016   HCT 40.2 08/28/2016   MCV 82.5 08/28/2016   PLT 476 (H) 08/28/2016      Component Value Date/Time   NA 140 08/28/2016 0559   NA 143 02/17/2015   K 5.3 (H) 08/28/2016 0559   CL 103 08/28/2016 0559   CO2 24 08/28/2016 0559   GLUCOSE 93 08/28/2016 0559   BUN 29 (H) 08/28/2016 0559   BUN 32 (A) 02/17/2015   CREATININE 1.20 (H) 08/28/2016 0559   CALCIUM 10.4 (H) 08/28/2016 0559   PROT 8.6 (H) 08/22/2016 0020   PROT 6.7 02/21/2014 1046   ALBUMIN 3.2 (L) 08/22/2016 0020   ALBUMIN 4.4 02/21/2014 1046   AST 26 08/22/2016 0020   ALT 23 08/22/2016 0020   ALKPHOS 126 08/22/2016 0020   BILITOT 0.4 08/22/2016 0020   GFRNONAA 39 (L) 08/28/2016 0559   GFRAA 45 (L) 08/28/2016 0559   Lab Results  Component Value Date   CHOL 179 02/21/2014   HDL 46 02/21/2014   LDLCALC 89 02/21/2014   TRIG 219 (H) 02/21/2014   CHOLHDL 3.9 02/21/2014    Lab Results  Component Value Date   VITAMINB12 495 04/23/2015   Lab Results  Component Value Date   TSH 2.302 04/23/2015      ASSESSMENT AND PLAN [age] y.o. year old  Dementia without behavioral issues Continue Namenda 10 mg twice a day    History of stroke, pulmonary emboli, on chronic Coumadin treatment  Levert FeinsteinYijun Karoline Fleer, M.D. Ph.D.  [facility] [address] [place], [place]  [id number] Phone: [phone] Fax:      [phone]

## 2017-04-27 DIAGNOSIS — K5730 Diverticulosis of large intestine without perforation or abscess without bleeding: Secondary | ICD-10-CM | POA: Diagnosis not present

## 2017-04-27 DIAGNOSIS — I409 Acute myocarditis, unspecified: Secondary | ICD-10-CM | POA: Diagnosis not present

## 2017-04-27 DIAGNOSIS — I5032 Chronic diastolic (congestive) heart failure: Secondary | ICD-10-CM | POA: Diagnosis not present

## 2017-04-27 DIAGNOSIS — Z96651 Presence of right artificial knee joint: Secondary | ICD-10-CM | POA: Diagnosis not present

## 2017-06-29 DIAGNOSIS — F0391 Unspecified dementia with behavioral disturbance: Secondary | ICD-10-CM | POA: Diagnosis not present

## 2017-06-29 DIAGNOSIS — M25561 Pain in right knee: Secondary | ICD-10-CM | POA: Diagnosis not present

## 2017-06-29 DIAGNOSIS — Z8673 Personal history of transient ischemic attack (TIA), and cerebral infarction without residual deficits: Secondary | ICD-10-CM | POA: Diagnosis not present

## 2017-06-29 DIAGNOSIS — R0782 Intercostal pain: Secondary | ICD-10-CM | POA: Diagnosis not present

## 2017-07-13 DIAGNOSIS — F0391 Unspecified dementia with behavioral disturbance: Secondary | ICD-10-CM | POA: Diagnosis not present

## 2017-07-13 DIAGNOSIS — F419 Anxiety disorder, unspecified: Secondary | ICD-10-CM | POA: Diagnosis not present

## 2017-07-13 DIAGNOSIS — M1711 Unilateral primary osteoarthritis, right knee: Secondary | ICD-10-CM | POA: Diagnosis not present

## 2017-07-13 DIAGNOSIS — I5032 Chronic diastolic (congestive) heart failure: Secondary | ICD-10-CM | POA: Diagnosis not present

## 2017-09-04 DIAGNOSIS — N390 Urinary tract infection, site not specified: Secondary | ICD-10-CM | POA: Diagnosis not present

## 2017-09-04 DIAGNOSIS — R319 Hematuria, unspecified: Secondary | ICD-10-CM | POA: Diagnosis not present

## 2017-09-15 DIAGNOSIS — F0391 Unspecified dementia with behavioral disturbance: Secondary | ICD-10-CM | POA: Diagnosis not present

## 2017-09-15 DIAGNOSIS — M25569 Pain in unspecified knee: Secondary | ICD-10-CM | POA: Diagnosis not present

## 2017-09-15 DIAGNOSIS — I5032 Chronic diastolic (congestive) heart failure: Secondary | ICD-10-CM | POA: Diagnosis not present

## 2017-09-15 DIAGNOSIS — M4800 Spinal stenosis, site unspecified: Secondary | ICD-10-CM | POA: Diagnosis not present

## 2017-09-19 DIAGNOSIS — M25561 Pain in right knee: Secondary | ICD-10-CM | POA: Diagnosis not present

## 2017-09-19 DIAGNOSIS — I5032 Chronic diastolic (congestive) heart failure: Secondary | ICD-10-CM | POA: Diagnosis not present

## 2017-09-19 DIAGNOSIS — M6281 Muscle weakness (generalized): Secondary | ICD-10-CM | POA: Diagnosis not present

## 2017-09-19 DIAGNOSIS — R2681 Unsteadiness on feet: Secondary | ICD-10-CM | POA: Diagnosis not present

## 2017-09-20 DIAGNOSIS — R2681 Unsteadiness on feet: Secondary | ICD-10-CM | POA: Diagnosis not present

## 2017-09-20 DIAGNOSIS — M25561 Pain in right knee: Secondary | ICD-10-CM | POA: Diagnosis not present

## 2017-09-20 DIAGNOSIS — I5032 Chronic diastolic (congestive) heart failure: Secondary | ICD-10-CM | POA: Diagnosis not present

## 2017-09-20 DIAGNOSIS — M6281 Muscle weakness (generalized): Secondary | ICD-10-CM | POA: Diagnosis not present

## 2017-09-21 DIAGNOSIS — M25561 Pain in right knee: Secondary | ICD-10-CM | POA: Diagnosis not present

## 2017-09-21 DIAGNOSIS — I5032 Chronic diastolic (congestive) heart failure: Secondary | ICD-10-CM | POA: Diagnosis not present

## 2017-09-21 DIAGNOSIS — R2681 Unsteadiness on feet: Secondary | ICD-10-CM | POA: Diagnosis not present

## 2017-09-21 DIAGNOSIS — M6281 Muscle weakness (generalized): Secondary | ICD-10-CM | POA: Diagnosis not present

## 2017-09-27 DIAGNOSIS — M6281 Muscle weakness (generalized): Secondary | ICD-10-CM | POA: Diagnosis not present

## 2017-09-27 DIAGNOSIS — M25561 Pain in right knee: Secondary | ICD-10-CM | POA: Diagnosis not present

## 2017-09-27 DIAGNOSIS — I5032 Chronic diastolic (congestive) heart failure: Secondary | ICD-10-CM | POA: Diagnosis not present

## 2017-09-27 DIAGNOSIS — R2681 Unsteadiness on feet: Secondary | ICD-10-CM | POA: Diagnosis not present

## 2017-09-28 DIAGNOSIS — I5032 Chronic diastolic (congestive) heart failure: Secondary | ICD-10-CM | POA: Diagnosis not present

## 2017-09-28 DIAGNOSIS — R2681 Unsteadiness on feet: Secondary | ICD-10-CM | POA: Diagnosis not present

## 2017-09-28 DIAGNOSIS — M25561 Pain in right knee: Secondary | ICD-10-CM | POA: Diagnosis not present

## 2017-09-28 DIAGNOSIS — M6281 Muscle weakness (generalized): Secondary | ICD-10-CM | POA: Diagnosis not present

## 2017-10-02 DIAGNOSIS — M25561 Pain in right knee: Secondary | ICD-10-CM | POA: Diagnosis not present

## 2017-10-02 DIAGNOSIS — M6281 Muscle weakness (generalized): Secondary | ICD-10-CM | POA: Diagnosis not present

## 2017-10-02 DIAGNOSIS — I5032 Chronic diastolic (congestive) heart failure: Secondary | ICD-10-CM | POA: Diagnosis not present

## 2017-10-02 DIAGNOSIS — R2681 Unsteadiness on feet: Secondary | ICD-10-CM | POA: Diagnosis not present

## 2017-10-04 DIAGNOSIS — M6281 Muscle weakness (generalized): Secondary | ICD-10-CM | POA: Diagnosis not present

## 2017-10-04 DIAGNOSIS — R2681 Unsteadiness on feet: Secondary | ICD-10-CM | POA: Diagnosis not present

## 2017-10-04 DIAGNOSIS — I5032 Chronic diastolic (congestive) heart failure: Secondary | ICD-10-CM | POA: Diagnosis not present

## 2017-10-04 DIAGNOSIS — M25561 Pain in right knee: Secondary | ICD-10-CM | POA: Diagnosis not present

## 2017-10-06 DIAGNOSIS — I5032 Chronic diastolic (congestive) heart failure: Secondary | ICD-10-CM | POA: Diagnosis not present

## 2017-10-06 DIAGNOSIS — M6281 Muscle weakness (generalized): Secondary | ICD-10-CM | POA: Diagnosis not present

## 2017-10-06 DIAGNOSIS — M25561 Pain in right knee: Secondary | ICD-10-CM | POA: Diagnosis not present

## 2017-10-06 DIAGNOSIS — R2681 Unsteadiness on feet: Secondary | ICD-10-CM | POA: Diagnosis not present

## 2017-10-09 DIAGNOSIS — I5032 Chronic diastolic (congestive) heart failure: Secondary | ICD-10-CM | POA: Diagnosis not present

## 2017-10-09 DIAGNOSIS — M6281 Muscle weakness (generalized): Secondary | ICD-10-CM | POA: Diagnosis not present

## 2017-10-09 DIAGNOSIS — R2681 Unsteadiness on feet: Secondary | ICD-10-CM | POA: Diagnosis not present

## 2017-10-09 DIAGNOSIS — M25561 Pain in right knee: Secondary | ICD-10-CM | POA: Diagnosis not present

## 2017-10-10 DIAGNOSIS — I5032 Chronic diastolic (congestive) heart failure: Secondary | ICD-10-CM | POA: Diagnosis not present

## 2017-10-10 DIAGNOSIS — R2681 Unsteadiness on feet: Secondary | ICD-10-CM | POA: Diagnosis not present

## 2017-10-10 DIAGNOSIS — M6281 Muscle weakness (generalized): Secondary | ICD-10-CM | POA: Diagnosis not present

## 2017-10-10 DIAGNOSIS — M25561 Pain in right knee: Secondary | ICD-10-CM | POA: Diagnosis not present

## 2017-10-17 DIAGNOSIS — Z471 Aftercare following joint replacement surgery: Secondary | ICD-10-CM | POA: Diagnosis not present

## 2017-10-17 DIAGNOSIS — M6281 Muscle weakness (generalized): Secondary | ICD-10-CM | POA: Diagnosis not present

## 2017-10-17 DIAGNOSIS — R278 Other lack of coordination: Secondary | ICD-10-CM | POA: Diagnosis not present

## 2017-10-18 DIAGNOSIS — Z471 Aftercare following joint replacement surgery: Secondary | ICD-10-CM | POA: Diagnosis not present

## 2017-10-18 DIAGNOSIS — R278 Other lack of coordination: Secondary | ICD-10-CM | POA: Diagnosis not present

## 2017-10-18 DIAGNOSIS — M6281 Muscle weakness (generalized): Secondary | ICD-10-CM | POA: Diagnosis not present

## 2017-10-19 DIAGNOSIS — R278 Other lack of coordination: Secondary | ICD-10-CM | POA: Diagnosis not present

## 2017-10-19 DIAGNOSIS — M6281 Muscle weakness (generalized): Secondary | ICD-10-CM | POA: Diagnosis not present

## 2017-10-19 DIAGNOSIS — R0989 Other specified symptoms and signs involving the circulatory and respiratory systems: Secondary | ICD-10-CM | POA: Diagnosis not present

## 2017-10-19 DIAGNOSIS — Z471 Aftercare following joint replacement surgery: Secondary | ICD-10-CM | POA: Diagnosis not present

## 2017-10-20 DIAGNOSIS — M6281 Muscle weakness (generalized): Secondary | ICD-10-CM | POA: Diagnosis not present

## 2017-10-20 DIAGNOSIS — R278 Other lack of coordination: Secondary | ICD-10-CM | POA: Diagnosis not present

## 2017-10-20 DIAGNOSIS — Z471 Aftercare following joint replacement surgery: Secondary | ICD-10-CM | POA: Diagnosis not present

## 2017-10-23 DIAGNOSIS — M6281 Muscle weakness (generalized): Secondary | ICD-10-CM | POA: Diagnosis not present

## 2017-10-23 DIAGNOSIS — Z471 Aftercare following joint replacement surgery: Secondary | ICD-10-CM | POA: Diagnosis not present

## 2017-10-23 DIAGNOSIS — R278 Other lack of coordination: Secondary | ICD-10-CM | POA: Diagnosis not present

## 2017-10-24 DIAGNOSIS — R278 Other lack of coordination: Secondary | ICD-10-CM | POA: Diagnosis not present

## 2017-10-24 DIAGNOSIS — M6281 Muscle weakness (generalized): Secondary | ICD-10-CM | POA: Diagnosis not present

## 2017-10-24 DIAGNOSIS — Z471 Aftercare following joint replacement surgery: Secondary | ICD-10-CM | POA: Diagnosis not present

## 2017-10-25 DIAGNOSIS — Z471 Aftercare following joint replacement surgery: Secondary | ICD-10-CM | POA: Diagnosis not present

## 2017-10-25 DIAGNOSIS — R278 Other lack of coordination: Secondary | ICD-10-CM | POA: Diagnosis not present

## 2017-10-25 DIAGNOSIS — M6281 Muscle weakness (generalized): Secondary | ICD-10-CM | POA: Diagnosis not present

## 2017-10-26 DIAGNOSIS — M6281 Muscle weakness (generalized): Secondary | ICD-10-CM | POA: Diagnosis not present

## 2017-10-26 DIAGNOSIS — R278 Other lack of coordination: Secondary | ICD-10-CM | POA: Diagnosis not present

## 2017-10-26 DIAGNOSIS — Z471 Aftercare following joint replacement surgery: Secondary | ICD-10-CM | POA: Diagnosis not present

## 2017-10-27 DIAGNOSIS — R278 Other lack of coordination: Secondary | ICD-10-CM | POA: Diagnosis not present

## 2017-10-27 DIAGNOSIS — Z471 Aftercare following joint replacement surgery: Secondary | ICD-10-CM | POA: Diagnosis not present

## 2017-10-27 DIAGNOSIS — M6281 Muscle weakness (generalized): Secondary | ICD-10-CM | POA: Diagnosis not present

## 2017-10-30 DIAGNOSIS — M6281 Muscle weakness (generalized): Secondary | ICD-10-CM | POA: Diagnosis not present

## 2017-10-30 DIAGNOSIS — R278 Other lack of coordination: Secondary | ICD-10-CM | POA: Diagnosis not present

## 2017-10-30 DIAGNOSIS — Z471 Aftercare following joint replacement surgery: Secondary | ICD-10-CM | POA: Diagnosis not present

## 2017-10-31 DIAGNOSIS — Z471 Aftercare following joint replacement surgery: Secondary | ICD-10-CM | POA: Diagnosis not present

## 2017-10-31 DIAGNOSIS — R278 Other lack of coordination: Secondary | ICD-10-CM | POA: Diagnosis not present

## 2017-10-31 DIAGNOSIS — M6281 Muscle weakness (generalized): Secondary | ICD-10-CM | POA: Diagnosis not present

## 2017-11-01 DIAGNOSIS — R278 Other lack of coordination: Secondary | ICD-10-CM | POA: Diagnosis not present

## 2017-11-01 DIAGNOSIS — M6281 Muscle weakness (generalized): Secondary | ICD-10-CM | POA: Diagnosis not present

## 2017-11-01 DIAGNOSIS — Z471 Aftercare following joint replacement surgery: Secondary | ICD-10-CM | POA: Diagnosis not present

## 2017-11-02 DIAGNOSIS — M6281 Muscle weakness (generalized): Secondary | ICD-10-CM | POA: Diagnosis not present

## 2017-11-02 DIAGNOSIS — Z471 Aftercare following joint replacement surgery: Secondary | ICD-10-CM | POA: Diagnosis not present

## 2017-11-02 DIAGNOSIS — R278 Other lack of coordination: Secondary | ICD-10-CM | POA: Diagnosis not present

## 2017-11-03 DIAGNOSIS — R278 Other lack of coordination: Secondary | ICD-10-CM | POA: Diagnosis not present

## 2017-11-03 DIAGNOSIS — M6281 Muscle weakness (generalized): Secondary | ICD-10-CM | POA: Diagnosis not present

## 2017-11-03 DIAGNOSIS — Z471 Aftercare following joint replacement surgery: Secondary | ICD-10-CM | POA: Diagnosis not present

## 2017-11-06 DIAGNOSIS — Z471 Aftercare following joint replacement surgery: Secondary | ICD-10-CM | POA: Diagnosis not present

## 2017-11-06 DIAGNOSIS — R278 Other lack of coordination: Secondary | ICD-10-CM | POA: Diagnosis not present

## 2017-11-06 DIAGNOSIS — M6281 Muscle weakness (generalized): Secondary | ICD-10-CM | POA: Diagnosis not present

## 2017-11-07 DIAGNOSIS — M6281 Muscle weakness (generalized): Secondary | ICD-10-CM | POA: Diagnosis not present

## 2017-11-07 DIAGNOSIS — R278 Other lack of coordination: Secondary | ICD-10-CM | POA: Diagnosis not present

## 2017-11-07 DIAGNOSIS — Z471 Aftercare following joint replacement surgery: Secondary | ICD-10-CM | POA: Diagnosis not present

## 2017-11-08 DIAGNOSIS — R278 Other lack of coordination: Secondary | ICD-10-CM | POA: Diagnosis not present

## 2017-11-08 DIAGNOSIS — M6281 Muscle weakness (generalized): Secondary | ICD-10-CM | POA: Diagnosis not present

## 2017-11-08 DIAGNOSIS — Z471 Aftercare following joint replacement surgery: Secondary | ICD-10-CM | POA: Diagnosis not present

## 2017-11-09 DIAGNOSIS — R278 Other lack of coordination: Secondary | ICD-10-CM | POA: Diagnosis not present

## 2017-11-09 DIAGNOSIS — M6281 Muscle weakness (generalized): Secondary | ICD-10-CM | POA: Diagnosis not present

## 2017-11-09 DIAGNOSIS — Z471 Aftercare following joint replacement surgery: Secondary | ICD-10-CM | POA: Diagnosis not present

## 2017-11-10 DIAGNOSIS — M6281 Muscle weakness (generalized): Secondary | ICD-10-CM | POA: Diagnosis not present

## 2017-11-10 DIAGNOSIS — Z471 Aftercare following joint replacement surgery: Secondary | ICD-10-CM | POA: Diagnosis not present

## 2017-11-10 DIAGNOSIS — R278 Other lack of coordination: Secondary | ICD-10-CM | POA: Diagnosis not present

## 2017-11-12 DIAGNOSIS — R278 Other lack of coordination: Secondary | ICD-10-CM | POA: Diagnosis not present

## 2017-11-12 DIAGNOSIS — Z471 Aftercare following joint replacement surgery: Secondary | ICD-10-CM | POA: Diagnosis not present

## 2017-11-12 DIAGNOSIS — M6281 Muscle weakness (generalized): Secondary | ICD-10-CM | POA: Diagnosis not present

## 2017-11-13 DIAGNOSIS — R278 Other lack of coordination: Secondary | ICD-10-CM | POA: Diagnosis not present

## 2017-11-13 DIAGNOSIS — M6281 Muscle weakness (generalized): Secondary | ICD-10-CM | POA: Diagnosis not present

## 2017-11-13 DIAGNOSIS — Z471 Aftercare following joint replacement surgery: Secondary | ICD-10-CM | POA: Diagnosis not present

## 2017-11-14 DIAGNOSIS — Z471 Aftercare following joint replacement surgery: Secondary | ICD-10-CM | POA: Diagnosis not present

## 2017-11-14 DIAGNOSIS — M6281 Muscle weakness (generalized): Secondary | ICD-10-CM | POA: Diagnosis not present

## 2017-11-14 DIAGNOSIS — R278 Other lack of coordination: Secondary | ICD-10-CM | POA: Diagnosis not present

## 2017-11-15 DIAGNOSIS — M6281 Muscle weakness (generalized): Secondary | ICD-10-CM | POA: Diagnosis not present

## 2017-11-15 DIAGNOSIS — R278 Other lack of coordination: Secondary | ICD-10-CM | POA: Diagnosis not present

## 2017-11-15 DIAGNOSIS — Z471 Aftercare following joint replacement surgery: Secondary | ICD-10-CM | POA: Diagnosis not present

## 2017-11-16 DIAGNOSIS — M6281 Muscle weakness (generalized): Secondary | ICD-10-CM | POA: Diagnosis not present

## 2017-11-16 DIAGNOSIS — J45909 Unspecified asthma, uncomplicated: Secondary | ICD-10-CM | POA: Diagnosis not present

## 2017-11-16 DIAGNOSIS — F0391 Unspecified dementia with behavioral disturbance: Secondary | ICD-10-CM | POA: Diagnosis not present

## 2017-11-16 DIAGNOSIS — I5032 Chronic diastolic (congestive) heart failure: Secondary | ICD-10-CM | POA: Diagnosis not present

## 2017-11-16 DIAGNOSIS — F329 Major depressive disorder, single episode, unspecified: Secondary | ICD-10-CM | POA: Diagnosis not present

## 2017-12-15 DIAGNOSIS — M1711 Unilateral primary osteoarthritis, right knee: Secondary | ICD-10-CM | POA: Diagnosis not present

## 2017-12-15 DIAGNOSIS — L853 Xerosis cutis: Secondary | ICD-10-CM | POA: Diagnosis not present

## 2017-12-15 DIAGNOSIS — L03115 Cellulitis of right lower limb: Secondary | ICD-10-CM | POA: Diagnosis not present

## 2017-12-22 DIAGNOSIS — L853 Xerosis cutis: Secondary | ICD-10-CM | POA: Diagnosis not present

## 2017-12-22 DIAGNOSIS — L03115 Cellulitis of right lower limb: Secondary | ICD-10-CM | POA: Diagnosis not present

## 2017-12-23 DIAGNOSIS — D649 Anemia, unspecified: Secondary | ICD-10-CM | POA: Diagnosis not present

## 2017-12-23 DIAGNOSIS — D509 Iron deficiency anemia, unspecified: Secondary | ICD-10-CM | POA: Diagnosis not present

## 2017-12-25 DIAGNOSIS — T8459XA Infection and inflammatory reaction due to other internal joint prosthesis, initial encounter: Secondary | ICD-10-CM | POA: Diagnosis not present

## 2017-12-25 DIAGNOSIS — M1711 Unilateral primary osteoarthritis, right knee: Secondary | ICD-10-CM | POA: Diagnosis not present

## 2017-12-26 DIAGNOSIS — M1711 Unilateral primary osteoarthritis, right knee: Secondary | ICD-10-CM | POA: Diagnosis not present

## 2017-12-28 DIAGNOSIS — M1711 Unilateral primary osteoarthritis, right knee: Secondary | ICD-10-CM | POA: Diagnosis not present

## 2018-01-01 DIAGNOSIS — M1711 Unilateral primary osteoarthritis, right knee: Secondary | ICD-10-CM | POA: Diagnosis not present

## 2018-01-01 DIAGNOSIS — T8459XA Infection and inflammatory reaction due to other internal joint prosthesis, initial encounter: Secondary | ICD-10-CM | POA: Diagnosis not present

## 2018-01-04 DIAGNOSIS — F0390 Unspecified dementia without behavioral disturbance: Secondary | ICD-10-CM | POA: Diagnosis not present

## 2018-01-04 DIAGNOSIS — E785 Hyperlipidemia, unspecified: Secondary | ICD-10-CM | POA: Diagnosis not present

## 2018-01-04 DIAGNOSIS — I10 Essential (primary) hypertension: Secondary | ICD-10-CM | POA: Diagnosis not present

## 2018-01-04 DIAGNOSIS — I2699 Other pulmonary embolism without acute cor pulmonale: Secondary | ICD-10-CM | POA: Diagnosis not present

## 2018-01-08 DIAGNOSIS — M1711 Unilateral primary osteoarthritis, right knee: Secondary | ICD-10-CM | POA: Diagnosis not present

## 2018-01-11 DIAGNOSIS — M1711 Unilateral primary osteoarthritis, right knee: Secondary | ICD-10-CM | POA: Diagnosis not present

## 2018-01-15 DIAGNOSIS — M1711 Unilateral primary osteoarthritis, right knee: Secondary | ICD-10-CM | POA: Diagnosis not present

## 2018-01-19 DIAGNOSIS — M1990 Unspecified osteoarthritis, unspecified site: Secondary | ICD-10-CM | POA: Diagnosis not present

## 2018-01-19 DIAGNOSIS — I10 Essential (primary) hypertension: Secondary | ICD-10-CM | POA: Diagnosis not present

## 2018-01-19 DIAGNOSIS — L02415 Cutaneous abscess of right lower limb: Secondary | ICD-10-CM | POA: Diagnosis not present

## 2018-01-19 DIAGNOSIS — I5032 Chronic diastolic (congestive) heart failure: Secondary | ICD-10-CM | POA: Diagnosis not present

## 2018-01-22 DIAGNOSIS — T8459XA Infection and inflammatory reaction due to other internal joint prosthesis, initial encounter: Secondary | ICD-10-CM | POA: Diagnosis not present

## 2018-01-22 DIAGNOSIS — Z96651 Presence of right artificial knee joint: Secondary | ICD-10-CM | POA: Diagnosis not present

## 2018-01-23 DIAGNOSIS — T8459XA Infection and inflammatory reaction due to other internal joint prosthesis, initial encounter: Secondary | ICD-10-CM | POA: Diagnosis not present

## 2018-01-24 DIAGNOSIS — R238 Other skin changes: Secondary | ICD-10-CM | POA: Diagnosis not present

## 2018-01-25 DIAGNOSIS — T8459XA Infection and inflammatory reaction due to other internal joint prosthesis, initial encounter: Secondary | ICD-10-CM | POA: Diagnosis not present

## 2018-01-27 DIAGNOSIS — L02415 Cutaneous abscess of right lower limb: Secondary | ICD-10-CM | POA: Diagnosis not present

## 2018-01-27 DIAGNOSIS — M1711 Unilateral primary osteoarthritis, right knee: Secondary | ICD-10-CM | POA: Diagnosis not present

## 2018-01-27 DIAGNOSIS — I10 Essential (primary) hypertension: Secondary | ICD-10-CM | POA: Diagnosis not present

## 2018-01-30 DIAGNOSIS — T8459XA Infection and inflammatory reaction due to other internal joint prosthesis, initial encounter: Secondary | ICD-10-CM | POA: Diagnosis not present

## 2018-02-05 DIAGNOSIS — T8459XA Infection and inflammatory reaction due to other internal joint prosthesis, initial encounter: Secondary | ICD-10-CM | POA: Diagnosis not present

## 2018-02-15 DIAGNOSIS — Z96651 Presence of right artificial knee joint: Secondary | ICD-10-CM | POA: Diagnosis not present

## 2018-02-15 DIAGNOSIS — M1711 Unilateral primary osteoarthritis, right knee: Secondary | ICD-10-CM | POA: Diagnosis not present

## 2018-02-19 DIAGNOSIS — S81001D Unspecified open wound, right knee, subsequent encounter: Secondary | ICD-10-CM | POA: Diagnosis not present

## 2018-02-22 DIAGNOSIS — S81001D Unspecified open wound, right knee, subsequent encounter: Secondary | ICD-10-CM | POA: Diagnosis not present

## 2018-02-26 DIAGNOSIS — S81001S Unspecified open wound, right knee, sequela: Secondary | ICD-10-CM | POA: Diagnosis not present

## 2018-03-09 NOTE — Progress Notes (Signed)
[facility]  PATIENT: Kristin Wilkerson DOB: 1926/01/03   REASON FOR VISIT: Follow-up for Alzheimer's dementia HISTORY FROM: Caregiver from facility    HISTORY OF PRESENT ILLNESS: HISTORY 05/01/15 (MM): Kristin Wilkerson is 84 years old right-handed female, accompanied by her daughter, and son at today's clinical visit, she was a patient of Dr. Pearlean Brownie for stroke, last clinical visit was in April 2016  She had a history of hypertension, hyperlipidemia, suffered a right occipital stroke in November 2014, patient was on aspirin 81 mg daily when the stroke happened, I have personally reviewed MRI of brain in November 2014, right occipital acute DWI relation, moderate generalized atrophy, MRA of the brain showed some irregularity at intra-cranial vessels, related intracranial atherosclerotic disease, no large vessel disease. Echocardiogram showed ejection fraction 55-60%, no cardioembolic source, ultrasound of carotid artery showed 1-39% bilateral internal carotid artery stenosis. She was discharged on Plavix,  She used to live at home, has mild baseline memory trouble, but still highly functional, she underwent right knee replacement in February 11 2015, was discharged to [facility] rehabilitation, she fell multiple times, injured her right knee, has to second right knee surgery in January fifth 2017, she is now at different facility[facility], she is no longer ambulatory, wheelchair bound.   During the process, she was found to have increased confusion, anxiety, develop visual hallucinations, she saw her sister who passed away 5 years ago, she has been taking Cymbalta to 30 mg daily for many years, she seems to respond to low-dose Xanax for a while, her hospital course was also complicated by UTI, pneumonia.  Her sons are in the process of finalizing her power of attorney paperwork, she has DO NOT RESUSCITATE order on file, she has good appetite, increased  agitation confusion and hallucination at evening time.  UPDATE Jan 28 2016; [facility],  I reviewed her laboratory evaluation in July 2017, mild anemia with hemoglobin of 10 point 9, low MCH 20 4.9, RDW 15 point 5, INR was elevated 2.7,  she is on chronic Coumadin treatment,  I also reviewed her medication list, she is DO NOT RESUSCITATE, taking Cymbalta 60 mg, Lasix 20 mg, losartan 50 mg, prostat 30 cc, Lopressor 100 mg twice a day, Namenda in 10 mg twice a day, vitamin D supplement mirtazapine 15 mg at bedtime Xanax as needed, as needed, Tylenol 500mg  2 tab three times a day.  She participates in activities occasionally, spend most of time watching TV. It is hard for her to read now.  UPDATE Mar 14 2017:YY She has increased memory loss, confusion, at night, she could be irritable, she eats well, sleeps well,  UPDATE 12/2/2019CM [name], 84 year old female returns for follow-up with a history of Alzheimer's dementia which is progressive.  She continues to have increased memory loss but her appetite is good and she sleeps well.  She is now nonambulatory due to a knee infection that has not responded well to antibiotics.  She remains on Coumadin for history of stroke and pulmonary embolus.  She returns for reevaluation  REVIEW OF SYSTEMS: Full 14 system review of systems performed and notable only for those listed, all others are neg:  Constitutional: neg  Cardiovascular: neg Ear/Nose/Throat: neg  Skin: neg Eyes: neg Respiratory: neg Gastroitestinal: neg  Hematology/Lymphatic: neg  Endocrine: neg Musculoskeletal: Nonambulatory Allergy/Immunology: neg Neurological: Dementia Psychiatric: neg Sleep : neg   ALLERGIES: No Known Allergies  HOME MEDICATIONS: Outpatient Medications  Prior to Visit  Medication Sig Dispense Refill  . acetaminophen (TYLENOL) 500 MG tablet Take 1,000 mg by mouth 3 (three) times daily.    [name] albuterol  (PROVENTIL) (2.5 MG/3ML) 0.083% nebulizer solution Take 2.5 mg by nebulization every 4 (four) hours as needed for wheezing or shortness of breath.    . calcium carbonate (TUMS - DOSED IN MG ELEMENTAL CALCIUM) 500 MG chewable tablet Chew 2 tablets by mouth every 4 (four) hours as needed for indigestion or heartburn.    . cholecalciferol (VITAMIN D) 1000 UNITS tablet Take 1,000 Units by mouth daily at 8 pm.     . doxycycline (VIBRA-TABS) 100 MG tablet Take 100 mg by mouth 2 (two) times daily.    . DULoxetine (CYMBALTA) 60 MG capsule Take 1 capsule (60 mg total) by mouth daily. One daily to help nerves 30 capsule 6  . DULoxetine HCl 60 MG CSDR Take 60 mg by mouth daily.    . fluticasone-salmeterol (ADVAIR HFA) 115-21 MCG/ACT inhaler Inhale 2 puffs into the lungs 2 (two) times daily.    . furosemide (LASIX) 20 MG tablet Take 1 tablet (20 mg total) by mouth every other day. (Patient taking differently: Take 40 mg by mouth 2 (two) times daily. ) 30 tablet 5  . guaiFENesin-dextromethorphan (ROBITUSSIN DM) 100-10 MG/5ML syrup Take 15 mLs by mouth every 4 (four) hours as needed for cough.    . latanoprost (XALATAN) 0.005 % ophthalmic solution PLACE 1 DROP IN BOTH EYES AT BEDTIME TO TREAT GLAUCOMA 7.5 mL 2  . latanoprost (XALATAN) 0.005 % ophthalmic solution Place 1 drop into both eyes at bedtime.    [name] losartan (COZAAR) 100 MG tablet TAKE 1 TABLET BY MOUTH ONCE DAILY FOR BLOOD PRESSURE (Patient taking differently: TAKE 50 MG BY MOUTH ONCE DAILY FOR BLOOD PRESSURE) 90 tablet 1  . meclizine (ANTIVERT) 12.5 MG tablet Take 12.5 mg by mouth 3 (three) times daily as needed for nausea.     . memantine (NAMENDA) 10 MG tablet Take 1 tablet (10 mg total) by mouth 2 (two) times daily. 180 tablet 4  . metoprolol (LOPRESSOR) 100 MG tablet TAKE 1 TABLET BY MOUTH TWICE DAILY TO CONTORL BLOOD PRESSURE (Patient taking differently: TAKE 100 MG BY MOUTH TWICE DAILY TO CONTORL BLOOD PRESSURE) 180 tablet 1  . mirtazapine (REMERON)  15 MG tablet Take 15 mg by mouth at bedtime.    . ranitidine (ZANTAC) 150 MG tablet Take 150 mg by mouth 2 (two) times daily.    . sennosides-docusate sodium (SENOKOT-S) 8.6-50 MG tablet Take 2 tablets by mouth daily as needed for constipation.     . sulfacetamide (BLEPH-10) 10 % ophthalmic solution Place 1 drop into the right eye 4 (four) times daily.    [name] warfarin (COUMADIN) 1 MG tablet Take 1-2 mg by mouth See admin instructions. Taking 1 mg on Mon, Wed, Fri  -- Taking 2 mg on Sun, Tue, Thurs, Sat daily at 4:30 pm    . tobramycin (TOBREX) 0.3 % ophthalmic solution Place 1-2 drops into both eyes 4 (four) times daily.     No facility-administered medications prior to visit.     PAST MEDICAL HISTORY: Past Medical History:  Diagnosis Date  . Anemia   . Anxiety   . CHF (congestive heart failure) (HCC)   . Dementia (HCC)   . Hyperlipidemia   . Hypertension     PAST SURGICAL HISTORY: Past Surgical History:  Procedure Laterality Date  . ABDOMINAL HYSTERECTOMY    .  BILATERAL OOPHORECTOMY    . CARDIOVASCULAR STRESS TEST  11/16/2009   Perfusion defect seen in inferior myocardial region consistent with diaphragmatic attenuation. Remaining myocardium demonstrates normalmyocardial perfusion with no evidence of ischemia or infarct. No ECG changes. EKG negative for ischemia.  . CHOLECYSTECTOMY    . ESOPHAGOGASTRODUODENOSCOPY N/A 08/27/2016   Procedure: ESOPHAGOGASTRODUODENOSCOPY (EGD);  Surgeon: Hilarie Fredrickson, MD;  Location: [facility];  Service: Endoscopy;  Laterality: N/A;  . I&D KNEE WITH POLY EXCHANGE Right 04/21/2015   Procedure: IRRIGATION AND DEBRIDEMENT RIGHT KNEE WITH PATELLECTOMY AND REPAIR OF COMPLEX OPEN WOUND;  Surgeon: Ranee Gosselin, MD;  Location: [facility];  Service: Orthopedics;  Laterality: Right;  . I&D RIGHT KNEE WITH PATELLECTOMY  04/21/2015  . LUMBAR LAMINECTOMY  12/04/2012   Complete decompressive L3-4 and L4-5--Dr. Darrelyn Hillock  . TOTAL KNEE ARTHROPLASTY Right 02/11/2015    Procedure: TOTAL KNEE ARTHROPLASTY;  Surgeon: Ranee Gosselin, MD;  Location: [facility];  Service: Orthopedics;  Laterality: Right;  . TRANSTHORACIC ECHOCARDIOGRAM  09/01/2011   EF >55% and moderate tricuspid valve regurg.    FAMILY HISTORY: Family History  Problem Relation Age of Onset  . Lung cancer Sister   . Stroke Mother   . Stroke Father     SOCIAL HISTORY: Social History   Socioeconomic History  . Marital status: Widowed    Spouse name: Not on file  . Number of children: 3  . Years of education: 12th  . Highest education level: Not on file  Occupational History  . Occupation: retired  Engineer, production  . Financial resource strain: Not on file  . Food insecurity:    Worry: Not on file    Inability: Not on file  . Transportation needs:    Medical: Not on file    Non-medical: Not on file  Tobacco Use  . Smoking status: Never Smoker  . Smokeless tobacco: Never Used  Substance and Sexual Activity  . Alcohol use: No  . Drug use: No  . Sexual activity: Never  Lifestyle  . Physical activity:    Days per week: Not on file    Minutes per session: Not on file  . Stress: Not on file  Relationships  . Social connections:    Talks on phone: Not on file    Gets together: Not on file    Attends religious service: Not on file    Active member of club or organization: Not on file    Attends meetings of clubs or organizations: Not on file    Relationship status: Not on file  . Intimate partner violence:    Fear of current or ex partner: Not on file    Emotionally abused: Not on file    Physically abused: Not on file    Forced sexual activity: Not on file  Other Topics Concern  . Not on file  Social History Narrative  . Not on file     PHYSICAL EXAM  Vitals:   03/19/18 0819  BP: 134/78  Pulse: 62  Height: 5\' 2"  (1.575 m)   Body mass index is 32.74 kg/m.  Generalized: Well developed, in no acute distress  Head: normocephalic and atraumatic,. Oropharynx benign    Neck: Supple, Musculoskeletal: No deformity   Neurological examination   Mentation: Alert not oriented to time or place.  Unable to obtain MMSE   Follows some  Commands, little speech.   Cranial nerve II-XII: Pupils were equal round reactive to light extraocular movements were full, visual field were full on  confrontational test. Facial sensation and strength were normal. hearing was intact to finger rubbing bilaterally. Uvula tongue midline. head turning and shoulder shrug were normal and symmetric.Tongue protrusion into cheek strength was normal. Motor: normal bulk and tone, full strength in the BUE,, right lower extremity examination is limited because of right knee pain, right knee infection Sensory: normal and symmetric to light touch, pinprick,  Coordination: Unable to perform  Reflexes: Depressed upper and lower Gait and Station: In wheelchair and nonambulatory  DIAGNOSTIC DATA (LABS, IMAGING, TESTING) - I reviewed patient records, labs, notes, testing and imaging myself where available.  Lab Results  Component Value Date   WBC 9.6 08/28/2016   HGB 11.9 (L) 08/28/2016   HCT 40.2 08/28/2016   MCV 82.5 08/28/2016   PLT 476 (H) 08/28/2016      Component Value Date/Time   NA 140 08/28/2016 0559   NA 143 02/17/2015   K 5.3 (H) 08/28/2016 0559   CL 103 08/28/2016 0559   CO2 24 08/28/2016 0559   GLUCOSE 93 08/28/2016 0559   BUN 29 (H) 08/28/2016 0559   BUN 32 (A) 02/17/2015   CREATININE 1.20 (H) 08/28/2016 0559   CALCIUM 10.4 (H) 08/28/2016 0559   PROT 8.6 (H) 08/22/2016 0020   PROT 6.7 02/21/2014 1046   ALBUMIN 3.2 (L) 08/22/2016 0020   ALBUMIN 4.4 02/21/2014 1046   AST 26 08/22/2016 0020   ALT 23 08/22/2016 0020   ALKPHOS 126 08/22/2016 0020   BILITOT 0.4 08/22/2016 0020   GFRNONAA 39 (L) 08/28/2016 0559   GFRAA 45 (L) 08/28/2016 0559   Lab Results  Component Value Date   CHOL 179 02/21/2014   HDL 46 02/21/2014   LDLCALC 89 02/21/2014   TRIG 219 (H) 02/21/2014    CHOLHDL 3.9 02/21/2014   Lab Results  Component Value Date   HGBA1C 5.7 (H) 03/01/2013   Lab Results  Component Value Date   VITAMINB12 495 04/23/2015   Lab Results  Component Value Date   TSH 2.302 04/23/2015      ASSESSMENT AND PLAN  84 y.o. year old here to follow-up for dementia without behavioral issues history of stroke pulmonary embolus on chronic Coumadin treatment.  Continue Namenda 10 mg twice daily for progressive Alzheimer's disease Patient resides in a skilled facility Follow-up yearly and as needed Nilda RiggsNancy [name] Areana Kosanke, Citizens Medical CenterGNP, [facility], APRN  [facility] [address], [address] [place], [place] [address] [phone]

## 2018-03-19 ENCOUNTER — Ambulatory Visit (INDEPENDENT_AMBULATORY_CARE_PROVIDER_SITE_OTHER): Payer: Medicare Other | Admitting: Nurse Practitioner

## 2018-03-19 ENCOUNTER — Encounter: Payer: Self-pay | Admitting: Nurse Practitioner

## 2018-03-19 VITALS — BP 134/78 | HR 62 | Ht 62.0 in

## 2018-03-19 DIAGNOSIS — F0281 Dementia in other diseases classified elsewhere with behavioral disturbance: Secondary | ICD-10-CM

## 2018-03-19 DIAGNOSIS — F0280 Dementia in other diseases classified elsewhere without behavioral disturbance: Secondary | ICD-10-CM | POA: Diagnosis not present

## 2018-03-19 DIAGNOSIS — G309 Alzheimer's disease, unspecified: Secondary | ICD-10-CM | POA: Diagnosis not present

## 2018-03-19 NOTE — Patient Instructions (Addendum)
Dementia without behavioral issues Continue Namenda 10 mg twice a day    History of stroke, pulmonary emboli, on chronic Coumadin treatment

## 2018-03-23 DIAGNOSIS — R278 Other lack of coordination: Secondary | ICD-10-CM | POA: Diagnosis not present

## 2018-03-23 DIAGNOSIS — I5032 Chronic diastolic (congestive) heart failure: Secondary | ICD-10-CM | POA: Diagnosis not present

## 2018-03-26 DIAGNOSIS — I5032 Chronic diastolic (congestive) heart failure: Secondary | ICD-10-CM | POA: Diagnosis not present

## 2018-03-26 DIAGNOSIS — R278 Other lack of coordination: Secondary | ICD-10-CM | POA: Diagnosis not present

## 2018-03-27 ENCOUNTER — Emergency Department (HOSPITAL_COMMUNITY): Payer: Medicare Other

## 2018-03-27 ENCOUNTER — Encounter (HOSPITAL_COMMUNITY): Payer: Self-pay | Admitting: Emergency Medicine

## 2018-03-27 ENCOUNTER — Inpatient Hospital Stay (HOSPITAL_COMMUNITY)
Admission: EM | Admit: 2018-03-27 | Discharge: 2018-04-01 | DRG: 689 | Disposition: A | Discharge status: Skilled Nursing Facility | Payer: Medicare Other | Source: Skilled Nursing Facility | Attending: Family Medicine | Admitting: Family Medicine

## 2018-03-27 DIAGNOSIS — S81001A Unspecified open wound, right knee, initial encounter: Secondary | ICD-10-CM | POA: Diagnosis present

## 2018-03-27 DIAGNOSIS — I5032 Chronic diastolic (congestive) heart failure: Secondary | ICD-10-CM | POA: Diagnosis not present

## 2018-03-27 DIAGNOSIS — R278 Other lack of coordination: Secondary | ICD-10-CM | POA: Diagnosis not present

## 2018-03-27 DIAGNOSIS — E870 Hyperosmolality and hypernatremia: Secondary | ICD-10-CM | POA: Diagnosis present

## 2018-03-27 DIAGNOSIS — Z515 Encounter for palliative care: Secondary | ICD-10-CM

## 2018-03-27 DIAGNOSIS — N390 Urinary tract infection, site not specified: Secondary | ICD-10-CM | POA: Diagnosis present

## 2018-03-27 DIAGNOSIS — F02818 Dementia in other diseases classified elsewhere, unspecified severity, with other behavioral disturbance: Secondary | ICD-10-CM | POA: Diagnosis present

## 2018-03-27 DIAGNOSIS — Z7189 Other specified counseling: Secondary | ICD-10-CM

## 2018-03-27 DIAGNOSIS — M2550 Pain in unspecified joint: Secondary | ICD-10-CM | POA: Diagnosis not present

## 2018-03-27 DIAGNOSIS — I959 Hypotension, unspecified: Secondary | ICD-10-CM | POA: Diagnosis present

## 2018-03-27 DIAGNOSIS — F0281 Dementia in other diseases classified elsewhere with behavioral disturbance: Secondary | ICD-10-CM | POA: Diagnosis present

## 2018-03-27 DIAGNOSIS — L89301 Pressure ulcer of unspecified buttock, stage 1: Secondary | ICD-10-CM | POA: Diagnosis present

## 2018-03-27 DIAGNOSIS — Z7901 Long term (current) use of anticoagulants: Secondary | ICD-10-CM

## 2018-03-27 DIAGNOSIS — K210 Gastro-esophageal reflux disease with esophagitis: Secondary | ICD-10-CM | POA: Diagnosis present

## 2018-03-27 DIAGNOSIS — N179 Acute kidney failure, unspecified: Secondary | ICD-10-CM | POA: Diagnosis not present

## 2018-03-27 DIAGNOSIS — Z7401 Bed confinement status: Secondary | ICD-10-CM | POA: Diagnosis not present

## 2018-03-27 DIAGNOSIS — I110 Hypertensive heart disease with heart failure: Secondary | ICD-10-CM | POA: Diagnosis present

## 2018-03-27 DIAGNOSIS — G9341 Metabolic encephalopathy: Secondary | ICD-10-CM | POA: Diagnosis present

## 2018-03-27 DIAGNOSIS — R55 Syncope and collapse: Secondary | ICD-10-CM | POA: Diagnosis not present

## 2018-03-27 DIAGNOSIS — F419 Anxiety disorder, unspecified: Secondary | ICD-10-CM | POA: Diagnosis present

## 2018-03-27 DIAGNOSIS — N3000 Acute cystitis without hematuria: Secondary | ICD-10-CM | POA: Diagnosis not present

## 2018-03-27 DIAGNOSIS — J9811 Atelectasis: Secondary | ICD-10-CM | POA: Diagnosis present

## 2018-03-27 DIAGNOSIS — Z741 Need for assistance with personal care: Secondary | ICD-10-CM | POA: Diagnosis present

## 2018-03-27 DIAGNOSIS — G308 Other Alzheimer's disease: Secondary | ICD-10-CM | POA: Diagnosis present

## 2018-03-27 DIAGNOSIS — E876 Hypokalemia: Secondary | ICD-10-CM | POA: Diagnosis present

## 2018-03-27 DIAGNOSIS — E860 Dehydration: Secondary | ICD-10-CM | POA: Diagnosis present

## 2018-03-27 DIAGNOSIS — E785 Hyperlipidemia, unspecified: Secondary | ICD-10-CM | POA: Diagnosis present

## 2018-03-27 DIAGNOSIS — Z96653 Presence of artificial knee joint, bilateral: Secondary | ICD-10-CM | POA: Diagnosis present

## 2018-03-27 DIAGNOSIS — Z90722 Acquired absence of ovaries, bilateral: Secondary | ICD-10-CM

## 2018-03-27 DIAGNOSIS — Z87440 Personal history of urinary (tract) infections: Secondary | ICD-10-CM

## 2018-03-27 DIAGNOSIS — G301 Alzheimer's disease with late onset: Secondary | ICD-10-CM | POA: Diagnosis not present

## 2018-03-27 DIAGNOSIS — G9340 Encephalopathy, unspecified: Secondary | ICD-10-CM | POA: Diagnosis present

## 2018-03-27 DIAGNOSIS — Z9181 History of falling: Secondary | ICD-10-CM

## 2018-03-27 DIAGNOSIS — Z801 Family history of malignant neoplasm of trachea, bronchus and lung: Secondary | ICD-10-CM

## 2018-03-27 DIAGNOSIS — Z8673 Personal history of transient ischemic attack (TIA), and cerebral infarction without residual deficits: Secondary | ICD-10-CM

## 2018-03-27 DIAGNOSIS — Z9049 Acquired absence of other specified parts of digestive tract: Secondary | ICD-10-CM

## 2018-03-27 DIAGNOSIS — Z86711 Personal history of pulmonary embolism: Secondary | ICD-10-CM

## 2018-03-27 DIAGNOSIS — G309 Alzheimer's disease, unspecified: Secondary | ICD-10-CM | POA: Diagnosis not present

## 2018-03-27 DIAGNOSIS — D649 Anemia, unspecified: Secondary | ICD-10-CM | POA: Diagnosis present

## 2018-03-27 DIAGNOSIS — E43 Unspecified severe protein-calorie malnutrition: Secondary | ICD-10-CM | POA: Diagnosis present

## 2018-03-27 DIAGNOSIS — L89152 Pressure ulcer of sacral region, stage 2: Secondary | ICD-10-CM | POA: Diagnosis present

## 2018-03-27 DIAGNOSIS — X58XXXA Exposure to other specified factors, initial encounter: Secondary | ICD-10-CM | POA: Diagnosis present

## 2018-03-27 DIAGNOSIS — I2699 Other pulmonary embolism without acute cor pulmonale: Secondary | ICD-10-CM | POA: Diagnosis present

## 2018-03-27 DIAGNOSIS — Z6829 Body mass index (BMI) 29.0-29.9, adult: Secondary | ICD-10-CM

## 2018-03-27 DIAGNOSIS — Z993 Dependence on wheelchair: Secondary | ICD-10-CM

## 2018-03-27 DIAGNOSIS — L8992 Pressure ulcer of unspecified site, stage 2: Secondary | ICD-10-CM | POA: Diagnosis not present

## 2018-03-27 DIAGNOSIS — L8990 Pressure ulcer of unspecified site, unspecified stage: Secondary | ICD-10-CM | POA: Diagnosis not present

## 2018-03-27 DIAGNOSIS — Z66 Do not resuscitate: Secondary | ICD-10-CM | POA: Diagnosis present

## 2018-03-27 DIAGNOSIS — E861 Hypovolemia: Secondary | ICD-10-CM | POA: Diagnosis present

## 2018-03-27 DIAGNOSIS — Z7951 Long term (current) use of inhaled steroids: Secondary | ICD-10-CM

## 2018-03-27 DIAGNOSIS — R011 Cardiac murmur, unspecified: Secondary | ICD-10-CM | POA: Diagnosis present

## 2018-03-27 DIAGNOSIS — F0280 Dementia in other diseases classified elsewhere without behavioral disturbance: Secondary | ICD-10-CM | POA: Diagnosis not present

## 2018-03-27 DIAGNOSIS — R4182 Altered mental status, unspecified: Secondary | ICD-10-CM | POA: Diagnosis not present

## 2018-03-27 DIAGNOSIS — F329 Major depressive disorder, single episode, unspecified: Secondary | ICD-10-CM | POA: Diagnosis present

## 2018-03-27 DIAGNOSIS — Z823 Family history of stroke: Secondary | ICD-10-CM

## 2018-03-27 DIAGNOSIS — I447 Left bundle-branch block, unspecified: Secondary | ICD-10-CM | POA: Diagnosis present

## 2018-03-27 DIAGNOSIS — Z79899 Other long term (current) drug therapy: Secondary | ICD-10-CM

## 2018-03-27 DIAGNOSIS — Z90710 Acquired absence of both cervix and uterus: Secondary | ICD-10-CM

## 2018-03-27 HISTORY — DX: Pressure ulcer of unspecified site, stage 2: L89.92

## 2018-03-27 LAB — CBC WITH DIFFERENTIAL/PLATELET
Abs Immature Granulocytes: 0.05 10*3/uL (ref 0.00–0.07)
BASOS PCT: 0 %
Basophils Absolute: 0 10*3/uL (ref 0.0–0.1)
EOS ABS: 0.1 10*3/uL (ref 0.0–0.5)
EOS PCT: 0 %
HEMATOCRIT: 41.6 % (ref 36.0–46.0)
Hemoglobin: 12.7 g/dL (ref 12.0–15.0)
Immature Granulocytes: 0 %
Lymphocytes Relative: 16 %
Lymphs Abs: 1.7 10*3/uL (ref 0.7–4.0)
MCH: 29.8 pg (ref 26.0–34.0)
MCHC: 30.5 g/dL (ref 30.0–36.0)
MCV: 97.7 fL (ref 80.0–100.0)
MONO ABS: 1.1 10*3/uL — AB (ref 0.1–1.0)
MONOS PCT: 10 %
NEUTROS ABS: 8.2 10*3/uL — AB (ref 1.7–7.7)
NEUTROS PCT: 74 %
PLATELETS: 224 10*3/uL (ref 150–400)
RBC: 4.26 MIL/uL (ref 3.87–5.11)
RDW: 14.7 % (ref 11.5–15.5)
WBC: 11.1 10*3/uL — AB (ref 4.0–10.5)
nRBC: 0 % (ref 0.0–0.2)

## 2018-03-27 LAB — URINALYSIS, ROUTINE W REFLEX MICROSCOPIC
BILIRUBIN URINE: NEGATIVE
Glucose, UA: NEGATIVE mg/dL
Hgb urine dipstick: NEGATIVE
Ketones, ur: NEGATIVE mg/dL
NITRITE: NEGATIVE
PH: 5 (ref 5.0–8.0)
Protein, ur: NEGATIVE mg/dL
Specific Gravity, Urine: 1.006 (ref 1.005–1.030)

## 2018-03-27 LAB — I-STAT CHEM 8, ED
BUN: 60 mg/dL — ABNORMAL HIGH (ref 8–23)
Calcium, Ion: 1.15 mmol/L (ref 1.15–1.40)
Chloride: 111 mmol/L (ref 98–111)
Creatinine, Ser: 2.8 mg/dL — ABNORMAL HIGH (ref 0.44–1.00)
GLUCOSE: 106 mg/dL — AB (ref 70–99)
HEMATOCRIT: 42 % (ref 36.0–46.0)
HEMOGLOBIN: 14.3 g/dL (ref 12.0–15.0)
POTASSIUM: 4.2 mmol/L (ref 3.5–5.1)
Sodium: 144 mmol/L (ref 135–145)
TCO2: 23 mmol/L (ref 22–32)

## 2018-03-27 LAB — PROTIME-INR
INR: 2.38
PROTHROMBIN TIME: 25.6 s — AB (ref 11.4–15.2)

## 2018-03-27 LAB — COMPREHENSIVE METABOLIC PANEL
ALT: 18 U/L (ref 0–44)
AST: 21 U/L (ref 15–41)
Albumin: 3 g/dL — ABNORMAL LOW (ref 3.5–5.0)
Alkaline Phosphatase: 117 U/L (ref 38–126)
Anion gap: 11 (ref 5–15)
BILIRUBIN TOTAL: 0.9 mg/dL (ref 0.3–1.2)
BUN: 69 mg/dL — ABNORMAL HIGH (ref 8–23)
CHLORIDE: 109 mmol/L (ref 98–111)
CO2: 24 mmol/L (ref 22–32)
CREATININE: 2.78 mg/dL — AB (ref 0.44–1.00)
Calcium: 9.5 mg/dL (ref 8.9–10.3)
GFR, EST AFRICAN AMERICAN: 17 mL/min — AB (ref >60)
GFR, EST NON AFRICAN AMERICAN: 14 mL/min — AB (ref >60)
Glucose, Bld: 107 mg/dL — ABNORMAL HIGH (ref 70–99)
POTASSIUM: 4.3 mmol/L (ref 3.5–5.1)
Sodium: 144 mmol/L (ref 135–145)
TOTAL PROTEIN: 6.6 g/dL (ref 6.5–8.1)

## 2018-03-27 LAB — I-STAT TROPONIN, ED: TROPONIN I, POC: 0.01 ng/mL (ref 0.00–0.08)

## 2018-03-27 MED ORDER — SODIUM CHLORIDE 0.9 % IV SOLN
1.0000 g | Freq: Once | INTRAVENOUS | Status: AC
  Administered 2018-03-27: 1 g via INTRAVENOUS
  Filled 2018-03-27: qty 10

## 2018-03-27 MED ORDER — SODIUM CHLORIDE 0.9 % IV BOLUS
1000.0000 mL | Freq: Once | INTRAVENOUS | Status: AC
  Administered 2018-03-27: 1000 mL via INTRAVENOUS

## 2018-03-27 MED ORDER — LACTATED RINGERS IV SOLN
INTRAVENOUS | Status: DC
  Administered 2018-03-28 – 2018-03-29 (×2): via INTRAVENOUS

## 2018-03-27 NOTE — H&P (Addendum)
Family Medicine Teaching [facility] Admission History and Physical Service Pager: [phone]  Patient name: Kristin Wilkerson Medical record number: [id number] Date of birth: Nov 25, 1925 Age: 84 y.o. Gender: female  Primary Care Provider: Sherron Monday, MD  Consultants: Palliative care  Code Status: DNR  Chief Complaint: Worsening altered mental status  Assessment and Plan: Kristin Wilkerson is a 84 y.o. female presenting with worsening mental status for the past two weeks in the setting of Alzheimer's disease and very poor oral intake, found to have an acute kidney injury and UTI secondary to dehydration.  PMH is significant for Alzheimer's dementia, hypertension, previous bilateral PE on warfarin therapy, previous CVA, normocytic anemia, anxiety/depression.  AMS, likely 2/2 to worsening Alzheimer's with dehydration and UTI: Acute on chronic, worsening.  Daughter reports a worsening of the patient's mental status over the past 1-2 weeks, with associated anorexia and decreased speech. VSS. Patient intermittently alert, with incoherent speech, unable to fully follow commands. Per family, patient is reportedly back to her recent baseline while in the ED.  CT head without acute intracranial findings.  Electrolytes WNL, Na 144, K 4.3, Creat 2.78, Ca 9.5.  BUN 69. Glucose 107.  Hgb 12.7. U/A hazy with large leukocytes, many bacteria.  CXR with mild left lower lobe atelectasis vs infiltrate.  Believe her recent change in mental status is likely multifactorial, suspect a worsening of her Alzheimer's dementia over the past several months with an acute decline in the last 1-2 weeks likely secondary to UTI. Could also consider contributing dehydration in the setting of reported significant decreased p.o. and fluid intake noted as below, further complicating her clinical presentation. Considered contributing pneumonia as questionable infiltrate on CXR and leukocytosis of 11, however less likely as family reports  no recent fever or coughing and no increased work of breathing, satting well on RA during exam. Patient is wheel chair bound and has had similar imaging finding in the past more consistent with atelectasis. Unlikely CVA, CT head negative without any notable focal deficits on exam. Unlikely metabolic, electrolytes and glucose WNL, however may consider uremia contributing as BUN 69.  -Admit to telemetry, attending Dr. Gwendolyn Grant -Monitor telemetry, vitals per routine -IV ceftriaxone for UTI -Continue mIVF, 100 cc/hr LR reassess given CHF -CBC, BMP, TSH, A1c, lipid panel -Palliative consult in the a.m., family in agreement -NPO, swallowing study per speech -PT/OT   AKI in the setting of dehydration CR 2.78, BUN 69 on admit, baseline around 1.0.  Likely secondary to dehydration from poor p.o. and fluid intake the last 1-2 weeks and reportedly taking an increased dose of her Lasix. Patient is also on ARB (losartan) for HTN which is likely also contributing. Patient also reportedly became unresponsive with moving from a seated to standing position earlier today, suspect in the setting of orthostasis from above. -Continue mIVF at 100 mL/hour LR -Orthostatic vital signs -Monitor BMP -Daily weights, strict I and O's --Hold lasix and losartan --Avoid nephrotoxic agents  R knee Wound: Chronic, stable.  ~ 1cm fairly superficial ulceration above knee with minor surrounding erythema. Developed after several falls on location in the setting of knee replacement in 2016/2017. Low concern for infection at this time, has been following with wound changes at her nursing home. Image in physical exam.  -Consulted wound care - Monitor area for changes  Low BPs in the setting of Chronic hypertension:  BP 97/79 on arrival.  Likely from hypovolemia.  Patient takes metoprolol 100 mg twice daily and losartan 50 mg  daily for blood pressure control. -Monitor BP -IVF as above - Hold home metoprolol and losartan in the  setting of low blood pressures, AKI, n.p.o  HFpEF: Chronic, Stable.  Echo in 08/2016 showing EF 60-65%, G1DD.  Hypovolemic on exam with trace edema in her LE bilaterally.  Reported she is taking Lasix 40 mg twice daily, however prescribed as 20 mg every other day. On last hospital discharge 08/2016 patient was sent home on 40 mg daily.   - Monitor BP - Hold Lasix in the setting of AKI and fluid rehydration - Verify Lasix dose taking at home in the a.m.  Alzheimer's Dementia: Chronic, worsening. Concern for worsening of her dementia as noted in assessment #1. Has had gradual decline in functionality, speech and appetite in the past few months. Recently seen by Neurology on 12/2 which recommended continuation of current regimen. Currently resides at [facility] in [place]. Takes Namenda and Remeron. -Holding Namenda and Remeron due to NPO status. -Monitor mental status  Previous bilateral PE: Chronic, stable. Pulmonary embolism in 2017 after knee surgery.  Breathing well on exam.  On chronic warfarin therapy. Discussion during last hospital admission to possible transition to aspirin given age and previous history of falls though now wheel chair bound. INR is therapeutic. Had a history of supratherapeutic INR (>5) back in 08/2016 which required vit K. -Continue home warfarin per pharmacy  Anxiety/Depression: Chronic, stable. Family endorses that she has been talking more about her late husband and mother and how she misses them, otherwise no other acute reports. Takes duloxetine at home. - Hold home duloxetine in setting of NPO  Previous CVA: Chronic, stable.   Proximally about 6 years ago (in 2014), CT head on admit showing stable prior infarcts in the right occipital lobe.  No residual deficits per family, however wheelchair-bound. - Monitor for any focal deficits  Protein calorie malnutrition Albumin on admission was 3.0 consistent with reported decrease po intake for the past few  months worsening in the past two weeks. --Follow up on Swallow screen --Nutrition consult --Ensure/ boost for added calorie/protein  FEN/GI: N.p.o., 100 mL/hr LR Prophylaxis: Warfarin per pharmacy, currently on SCDs  Disposition: Admit to telemetry, attending Dr. Gwendolyn Grant.  History of Present Illness:  Kristin Wilkerson is a 84 y.o. female with a past history significant for Alzheimer's dementia and recurrent UTIs presenting with dehydration and worsening mental status.  Patient is nonverbal, received history from daughter on the phone.  Her daughter states she was called this afternoon stating her mother was slumped and unresponsive when they were trying to stand her up, however was able to regain consciousness and return to her baseline. Daughter is unsure how long she was unresponsive for.  Unfortunately, her daughter is concerned as she is watched her mental status slowly deteriorate over the last 6 months, with further worsening over the past 1-2 weeks.  She notes her mother has not been eating as much or at all for the last week or 2 and has become more quiet.  She states she has not had a full conversation with her mom and weeks as she is unable to fully respond to her and is been talking in small fragmented sentences.  She states her mother appears " just very tired" and that the patient often talks about her late husband and mother as if she is wanting to meet with them again.  The patient is a resident at the [facility] nursing home for the past 2.5 years, and daughter  would like her to return to their care when she is more stable. She is interested in speaking with palliative during this hospitalization.  ED Course: On arrival, she was hemodynamically stable in no acute distress.  CT head without acute intracranial findings.  Labs notable for creatinine 2.78, BUN 69, WBC 11, troponin 0.01, hemoglobin 12.7.  U/a hazy with large leukocytes, many bacteria, and > 50 WBCs.  CXR with mild left lower lobe  atelectasis versus infiltrate.  EKG NSR with incomplete LBBB, and occasional PVCs.   Review Of Systems: Per HPI with the following additions: Reported per daughter, patient not able to answer due to nonverbal status.  Review of Systems  Constitutional: Negative for fever.  Respiratory: Negative for cough and sputum production.   Gastrointestinal: Negative for constipation and diarrhea.  Musculoskeletal: Negative for falls.  Psychiatric/Behavioral: Positive for memory loss.    Patient Active Problem List   Diagnosis Date Noted  . Alzheimer's dementia with behavioral disturbance (HCC) 03/14/2017  . Esophageal stricture   . Gastroesophageal reflux disease with esophagitis   . AKI (acute kidney injury) (HCC) 08/22/2016  . CAP (community acquired pneumonia) 08/22/2016  . Elevated INR 08/22/2016  . Acute lower UTI 08/22/2016  . UTI (urinary tract infection) 08/22/2016  . Pulmonary embolism (HCC) 05/08/2015  . Anxiety and depression 05/08/2015  . Chronic diastolic CHF (congestive heart failure), NYHA class 1 (HCC) 05/08/2015  . Benign essential HTN 05/08/2015  . Dyslipidemia 05/08/2015  . Normocytic anemia 05/08/2015  . Syncope 05/08/2015  . DNR (do not resuscitate)   . Palliative care encounter   . Pulmonary embolism, bilateral (HCC)   . Dementia (HCC) 05/01/2015    Past Medical History: Past Medical History:  Diagnosis Date  . Anemia   . Anxiety   . CHF (congestive heart failure) (HCC)   . Dementia (HCC)   . Hyperlipidemia   . Hypertension     Past Surgical History: Past Surgical History:  Procedure Laterality Date  . ABDOMINAL HYSTERECTOMY    . BILATERAL OOPHORECTOMY    . CARDIOVASCULAR STRESS TEST  11/16/2009   Perfusion defect seen in inferior myocardial region consistent with diaphragmatic attenuation. Remaining myocardium demonstrates normalmyocardial perfusion with no evidence of ischemia or infarct. No ECG changes. EKG negative for ischemia.  . CHOLECYSTECTOMY     . ESOPHAGOGASTRODUODENOSCOPY N/A 08/27/2016   Procedure: ESOPHAGOGASTRODUODENOSCOPY (EGD);  Surgeon: Hilarie FredricksonPerry, John N, MD;  [facility];  Service: Endoscopy;  Laterality: N/A;  . I&D KNEE WITH POLY EXCHANGE Right 04/21/2015   Procedure: IRRIGATION AND DEBRIDEMENT RIGHT KNEE WITH PATELLECTOMY AND REPAIR OF COMPLEX OPEN WOUND;  Surgeon: Ranee Gosselinonald Gioffre, MD;  Location: [facility];  Service: Orthopedics;  Laterality: Right;  . I&D RIGHT KNEE WITH PATELLECTOMY  04/21/2015  . LUMBAR LAMINECTOMY  12/04/2012   Complete decompressive L3-4 and L4-5--Dr. [name]  . TOTAL KNEE ARTHROPLASTY Right 02/11/2015   Procedure: TOTAL KNEE ARTHROPLASTY;  Surgeon: Ranee Gosselinonald Gioffre, MD;  Location: [facility];  Service: Orthopedics;  Laterality: Right;  . TRANSTHORACIC ECHOCARDIOGRAM  09/01/2011   EF >55% and moderate tricuspid valve regurg.    Social History: Social History   Tobacco Use  . Smoking status: Never Smoker  . Smokeless tobacco: Never Used  Substance Use Topics  . Alcohol use: No  . Drug use: No   Please also refer to relevant sections of EMR.  Family History: Family History  Problem Relation Age of Onset  . Lung cancer Sister   . Stroke Mother   . Stroke Father  Allergies and Medications: No Known Allergies No current facility-administered medications on file prior to encounter.    Current Outpatient Medications on File Prior to Encounter  Medication Sig Dispense Refill  . acetaminophen (TYLENOL) 500 MG tablet Take 1,000 mg by mouth 3 (three) times daily.    [name] albuterol (PROVENTIL) (2.5 MG/3ML) 0.083% nebulizer solution Take 2.5 mg by nebulization every 4 (four) hours as needed for wheezing or shortness of breath.    . calcium carbonate (TUMS - DOSED IN MG ELEMENTAL CALCIUM) 500 MG chewable tablet Chew 2 tablets by mouth every 4 (four) hours as needed for indigestion or heartburn.    . cholecalciferol (VITAMIN D) 1000 UNITS tablet Take 1,000 Units by mouth daily at 8 pm.     . doxycycline  (VIBRA-TABS) 100 MG tablet Take 100 mg by mouth 2 (two) times daily.    . DULoxetine (CYMBALTA) 60 MG capsule Take 1 capsule (60 mg total) by mouth daily. One daily to help nerves 30 capsule 6  . DULoxetine HCl 60 MG CSDR Take 60 mg by mouth daily.    . fluticasone-salmeterol (ADVAIR HFA) 115-21 MCG/ACT inhaler Inhale 2 puffs into the lungs 2 (two) times daily.    . furosemide (LASIX) 20 MG tablet Take 1 tablet (20 mg total) by mouth every other day. (Patient taking differently: Take 40 mg by mouth 2 (two) times daily. ) 30 tablet 5  . guaiFENesin-dextromethorphan (ROBITUSSIN DM) 100-10 MG/5ML syrup Take 15 mLs by mouth every 4 (four) hours as needed for cough.    . latanoprost (XALATAN) 0.005 % ophthalmic solution PLACE 1 DROP IN BOTH EYES AT BEDTIME TO TREAT GLAUCOMA 7.5 mL 2  . latanoprost (XALATAN) 0.005 % ophthalmic solution Place 1 drop into both eyes at bedtime.    [name] losartan (COZAAR) 100 MG tablet TAKE 1 TABLET BY MOUTH ONCE DAILY FOR BLOOD PRESSURE (Patient taking differently: TAKE 50 MG BY MOUTH ONCE DAILY FOR BLOOD PRESSURE) 90 tablet 1  . meclizine (ANTIVERT) 12.5 MG tablet Take 12.5 mg by mouth 3 (three) times daily as needed for nausea.     . memantine (NAMENDA) 10 MG tablet Take 1 tablet (10 mg total) by mouth 2 (two) times daily. 180 tablet 4  . metoprolol (LOPRESSOR) 100 MG tablet TAKE 1 TABLET BY MOUTH TWICE DAILY TO CONTORL BLOOD PRESSURE (Patient taking differently: TAKE 100 MG BY MOUTH TWICE DAILY TO CONTORL BLOOD PRESSURE) 180 tablet 1  . mirtazapine (REMERON) 15 MG tablet Take 15 mg by mouth at bedtime.    . ranitidine (ZANTAC) 150 MG tablet Take 150 mg by mouth 2 (two) times daily.    . sennosides-docusate sodium (SENOKOT-S) 8.6-50 MG tablet Take 2 tablets by mouth daily as needed for constipation.     . sulfacetamide (BLEPH-10) 10 % ophthalmic solution Place 1 drop into the right eye 4 (four) times daily.    [name] tobramycin (TOBREX) 0.3 % ophthalmic solution Place 1-2 drops into  both eyes 4 (four) times daily.    [name] warfarin (COUMADIN) 1 MG tablet Take 1-2 mg by mouth See admin instructions. Taking 1 mg on Mon, Wed, Fri  -- Taking 2 mg on Sun, Tue, Thurs, Sat daily at 4:30 pm      Objective: BP 97/81   Pulse 78   Temp 97.7 F (36.5 C) (Oral) Comment: Simultaneous filing. User may not have seen previous data. Comment (Src): Simultaneous filing. User may not have seen previous data.  Resp 16   SpO2 95%  Exam:  General: Alert, NAD HEENT: NCAT, MM very dry, pupils equal Cardiac: RRR no m/g/r Lungs: Clear bilaterally, with minor crackles in left posterior lung field.  No increased work of breathing, satting well on RA. Abdomen: soft, appears non-tender, non-distended, normoactive BS Ext: Warm, dry, 2+ distal pulses, slight 1+ pitting edema to bilateral ankles. Neuro: Intermittently alert, nonverbal with incoherent speech occasionally.  Inconsistently/rarely followed commands. Opens eyes to voice, eye tracks well. Derm: No rashes or bruising. Chronic wound as below. ~1 cm ulceration, fairly superficial, above right knee with dressing in place.    Wound above right knee.      Labs and Imaging: CBC BMET  Recent Labs  Lab 03/27/18 2030  WBC 11.1*  HGB 12.7  HCT 41.6  PLT 224   Recent Labs  Lab 03/27/18 2030  NA 144  K 4.3  CL 109  CO2 24  BUN 69*  CREATININE 2.78*  GLUCOSE 107*  CALCIUM 9.5     Ct Head Wo Contrast  Result Date: 03/27/2018 CLINICAL DATA:  Recent syncopal episode EXAM: CT HEAD WITHOUT CONTRAST TECHNIQUE: Contiguous axial images were obtained from the base of the skull through the vertex without intravenous contrast. COMPARISON:  08/22/2016 FINDINGS: Brain: Atrophic changes are noted. Findings consistent with prior infarcts in right occipital lobe inferiorly is seen. No findings to suggest acute hemorrhage, acute infarction or space-occupying mass lesion are noted. Vascular: No hyperdense vessel or unexpected calcification. Skull:  Normal. Negative for fracture or focal lesion. Sinuses/Orbits: No acute finding. Other: None. IMPRESSION: Chronic changes stable from the previous exam. No acute intracranial abnormality is noted. This Electronically Signed   By: Alcide Clever M.D.   On: 03/27/2018 21:38   Dg Chest Portable 1 View  Result Date: 03/27/2018 CLINICAL DATA:  Generalized weakness EXAM: PORTABLE CHEST 1 VIEW COMPARISON:  08/25/2016 FINDINGS: Mild left lower lobe airspace disease may represent atelectasis or pneumonia. Right lung clear. Negative for heart failure or effusion. IMPRESSION: Mild left lower lobe atelectasis/infiltrate. Electronically Signed   By: Marlan Palau M.D.   On: 03/27/2018 19:59     I have seen and evaluated the patient with Dr. Annia Friendly. I am in agreement with the note above in its revised form. My additions are in blue.  Lovena Neighbours, MD Family Medicine, PGY-3   Allayne Stack, DO 03/27/2018, 9:59 PM PGY-1, [facility] Family Medicine FPTS Intern pager: [phone], text pages welcome

## 2018-03-27 NOTE — ED Notes (Signed)
Pt to CT at this time.

## 2018-03-27 NOTE — ED Provider Notes (Signed)
[facility] EMERGENCY DEPARTMENT Provider Note   CSN: [id number] Arrival date & time: 03/27/18  1837     History   Chief Complaint Chief Complaint  Patient presents with  . Loss of Consciousness    HPI Kristin Wilkerson is a 84 y.o. female.  HPI   84 year old female with history of CHF, dementia, hypertension, here with syncopal episode.  The patient has been nonverbal for the last 2 weeks so was unable to provide history.  According report from the family, the patient is had progressively worsening decline in mental status.  Over the last 2 weeks, she has been from confused to essentially nonverbal.  She is no longer eating and drinking.  They recognize that this may be a progression of her underlying dementia, but states it is a fairly abrupt change.  They do not recall any falls.  Per report from the nursing facility, the patient was lifted up today to transfer to a chair and reportedly lost consciousness.  They initially thought the patient and passed away.  She then regained consciousness and is now back to her baseline.  Level 5 caveat invoked as remainder of history, ROS, and physical exam limited due to patient's dementia.   Past Medical History:  Diagnosis Date  . Anemia   . Anxiety   . CHF (congestive heart failure) (HCC)   . Dementia (HCC)   . Hyperlipidemia   . Hypertension     Patient Active Problem List   Diagnosis Date Noted  . Altered mental status 03/27/2018  . Alzheimer's dementia with behavioral disturbance (HCC) 03/14/2017  . Esophageal stricture   . Gastroesophageal reflux disease with esophagitis   . AKI (acute kidney injury) (HCC) 08/22/2016  . CAP (community acquired pneumonia) 08/22/2016  . Elevated INR 08/22/2016  . Acute lower UTI 08/22/2016  . UTI (urinary tract infection) 08/22/2016  . Pulmonary embolism (HCC) 05/08/2015  . Anxiety and depression 05/08/2015  . Chronic diastolic CHF (congestive heart failure), NYHA class 1  (HCC) 05/08/2015  . Benign essential HTN 05/08/2015  . Dyslipidemia 05/08/2015  . Normocytic anemia 05/08/2015  . Syncope 05/08/2015  . DNR (do not resuscitate)   . Palliative care encounter   . Pulmonary embolism, bilateral (HCC)   . Dementia (HCC) 05/01/2015    Past Surgical History:  Procedure Laterality Date  . ABDOMINAL HYSTERECTOMY    . BILATERAL OOPHORECTOMY    . CARDIOVASCULAR STRESS TEST  11/16/2009   Perfusion defect seen in inferior myocardial region consistent with diaphragmatic attenuation. Remaining myocardium demonstrates normalmyocardial perfusion with no evidence of ischemia or infarct. No ECG changes. EKG negative for ischemia.  . CHOLECYSTECTOMY    . ESOPHAGOGASTRODUODENOSCOPY N/A 08/27/2016   Procedure: ESOPHAGOGASTRODUODENOSCOPY (EGD);  Surgeon: Hilarie Fredrickson, MD;  Location: [facility];  Service: Endoscopy;  Laterality: N/A;  . I&D KNEE WITH POLY EXCHANGE Right 04/21/2015   Procedure: IRRIGATION AND DEBRIDEMENT RIGHT KNEE WITH PATELLECTOMY AND REPAIR OF COMPLEX OPEN WOUND;  Surgeon: Ranee Gosselin, MD;  Location: [facility];  Service: Orthopedics;  Laterality: Right;  . I&D RIGHT KNEE WITH PATELLECTOMY  04/21/2015  . LUMBAR LAMINECTOMY  12/04/2012   Complete decompressive L3-4 and L4-5--Dr. Darrelyn Hillock  . TOTAL KNEE ARTHROPLASTY Right 02/11/2015   Procedure: TOTAL KNEE ARTHROPLASTY;  Surgeon: Ranee Gosselin, MD;  Location: [facility];  Service: Orthopedics;  Laterality: Right;  . TRANSTHORACIC ECHOCARDIOGRAM  09/01/2011   EF >55% and moderate tricuspid valve regurg.     OB History   None  Home Medications    Prior to Admission medications   Medication Sig Start Date End Date Taking? Authorizing Provider  acetaminophen (TYLENOL) 500 MG tablet Take 1,000 mg by mouth 3 (three) times daily.    [provider]  albuterol (PROVENTIL) (2.5 MG/3ML) 0.083% nebulizer solution Take 2.5 mg by nebulization every 4 (four) hours as needed for wheezing or shortness of breath.     [provider]  calcium carbonate (TUMS - DOSED IN MG ELEMENTAL CALCIUM) 500 MG chewable tablet Chew 2 tablets by mouth every 4 (four) hours as needed for indigestion or heartburn.    [provider]  cholecalciferol (VITAMIN D) 1000 UNITS tablet Take 1,000 Units by mouth daily at 8 pm.     [provider]  doxycycline (VIBRA-TABS) 100 MG tablet Take 100 mg by mouth 2 (two) times daily.    [provider]  DULoxetine (CYMBALTA) 60 MG capsule Take 1 capsule (60 mg total) by mouth daily. One daily to help nerves 05/01/15   Levert FeinsteinYan, Yijun, MD  DULoxetine HCl 60 MG CSDR Take 60 mg by mouth daily.    [provider]  fluticasone-salmeterol (ADVAIR HFA) 115-21 MCG/ACT inhaler Inhale 2 puffs into the lungs 2 (two) times daily.    [provider]  furosemide (LASIX) 20 MG tablet Take 1 tablet (20 mg total) by mouth every other day. Patient taking differently: Take 40 mg by mouth 2 (two) times daily.  04/24/15   Constable, Amber, PA-C  guaiFENesin-dextromethorphan (ROBITUSSIN DM) 100-10 MG/5ML syrup Take 15 mLs by mouth every 4 (four) hours as needed for cough.    [provider]  latanoprost (XALATAN) 0.005 % ophthalmic solution PLACE 1 DROP IN BOTH EYES AT BEDTIME TO TREAT GLAUCOMA 02/23/15   Kimber RelicGreen, Arthur G, MD  latanoprost (XALATAN) 0.005 % ophthalmic solution Place 1 drop into both eyes at bedtime.    [provider]  losartan (COZAAR) 100 MG tablet TAKE 1 TABLET BY MOUTH ONCE DAILY FOR BLOOD PRESSURE Patient taking differently: TAKE 50 MG BY MOUTH ONCE DAILY FOR BLOOD PRESSURE 11/17/14   Kimber RelicGreen, Arthur G, MD  meclizine (ANTIVERT) 12.5 MG tablet Take 12.5 mg by mouth 3 (three) times daily as needed for nausea.     [provider]  memantine (NAMENDA) 10 MG tablet Take 1 tablet (10 mg total) by mouth 2 (two) times daily. 03/14/17   Levert FeinsteinYan, Yijun, MD  metoprolol (LOPRESSOR) 100 MG tablet TAKE 1 TABLET BY MOUTH TWICE DAILY TO CONTORL  BLOOD PRESSURE Patient taking differently: TAKE 100 MG BY MOUTH TWICE DAILY TO CONTORL BLOOD PRESSURE 06/23/14   Kimber RelicGreen, Arthur G, MD  mirtazapine (REMERON) 15 MG tablet Take 15 mg by mouth at bedtime.    [provider]  ranitidine (ZANTAC) 150 MG tablet Take 150 mg by mouth 2 (two) times daily.    [provider]  sennosides-docusate sodium (SENOKOT-S) 8.6-50 MG tablet Take 2 tablets by mouth daily as needed for constipation.     [provider]  sulfacetamide (BLEPH-10) 10 % ophthalmic solution Place 1 drop into the right eye 4 (four) times daily.    [provider]  tobramycin (TOBREX) 0.3 % ophthalmic solution Place 1-2 drops into both eyes 4 (four) times daily.    [provider]  warfarin (COUMADIN) 1 MG tablet Take 1-2 mg by mouth See admin instructions. Taking 1 mg on Mon, Wed, Fri  -- Taking 2 mg on Sun, Tue, Thurs, Sat daily at 4:30 pm  [provider]    Family History Family History  Problem Relation Age of Onset  . Lung cancer Sister   . Stroke Mother   . Stroke Father     Social History Social History   Tobacco Use  . Smoking status: Never Smoker  . Smokeless tobacco: Never Used  Substance Use Topics  . Alcohol use: No  . Drug use: No     Allergies   Patient has no known allergies.   Review of Systems Review of Systems  Unable to perform ROS: Dementia     Physical Exam Updated Vital Signs BP 111/85 (BP Location: Right Arm)   Pulse 90   Temp 97.7 F (36.5 C) (Oral) Comment: Simultaneous filing. User may not have seen previous data. Comment (Src): Simultaneous filing. User may not have seen previous data.  Resp 19   SpO2 93%   Physical Exam  Constitutional: She appears well-developed and well-nourished. No distress.  Elderly, frail  HENT:  Head: Normocephalic and atraumatic.  Dry MM  Eyes: Conjunctivae are normal.  Neck: Neck supple.  Cardiovascular: Normal rate, regular rhythm and normal heart  sounds. Exam reveals no friction rub.  No murmur heard. Pulmonary/Chest: Effort normal and breath sounds normal. No respiratory distress. She has no wheezes. She has no rales.  Abdominal: She exhibits no distension.  Musculoskeletal: She exhibits no edema.  Neurological: She exhibits normal muscle tone.  Mumbles incoherently, intermittently follows commands. Speech soft. Face symmetric.   Skin: Skin is warm. Capillary refill takes less than 2 seconds.  Psychiatric: She has a normal mood and affect.  Nursing note and vitals reviewed.    ED Treatments / Results  Labs (all labs ordered are listed, but only abnormal results are displayed) Labs Reviewed  CBC WITH DIFFERENTIAL/PLATELET - Abnormal; Notable for the following components:      Result Value   WBC 11.1 (*)    Neutro Abs 8.2 (*)    Monocytes Absolute 1.1 (*)    All other components within normal limits  COMPREHENSIVE METABOLIC PANEL - Abnormal; Notable for the following components:   Glucose, Bld 107 (*)    BUN 69 (*)    Creatinine, Ser 2.78 (*)    Albumin 3.0 (*)    GFR calc non Af Amer 14 (*)    GFR calc Af Amer 17 (*)    All other components within normal limits  URINALYSIS, ROUTINE W REFLEX MICROSCOPIC - Abnormal; Notable for the following components:   Color, Urine STRAW (*)    APPearance HAZY (*)    Leukocytes, UA LARGE (*)    WBC, UA >50 (*)    Bacteria, UA MANY (*)    All other components within normal limits  PROTIME-INR - Abnormal; Notable for the following components:   Prothrombin Time 25.6 (*)    All other components within normal limits  I-STAT CHEM 8, ED - Abnormal; Notable for the following components:   BUN 60 (*)    Creatinine, Ser 2.80 (*)    Glucose, Bld 106 (*)    All other components within normal limits  URINE CULTURE  PROTIME-INR  TSH  HEMOGLOBIN A1C  BASIC METABOLIC PANEL  CBC  LIPID PANEL  I-STAT TROPONIN, ED    EKG EKG Interpretation  Date/Time:  Tuesday March 27 2018  18:51:45 EST Ventricular Rate:  82 PR Interval:    QRS Duration: 120 QT Interval:  420 QTC Calculation: 491 R Axis:   -45 Text Interpretation:  Sinus rhythm Paired  ventricular premature complexes Incomplete left bundle branch block No significant change since last tracing Confirmed by Shaune Pollack [phone]) on 03/27/2018 7:01:42 PM   Radiology Ct Head Wo Contrast  Result Date: 03/27/2018 CLINICAL DATA:  Recent syncopal episode EXAM: CT HEAD WITHOUT CONTRAST TECHNIQUE: Contiguous axial images were obtained from the base of the skull through the vertex without intravenous contrast. COMPARISON:  08/22/2016 FINDINGS: Brain: Atrophic changes are noted. Findings consistent with prior infarcts in right occipital lobe inferiorly is seen. No findings to suggest acute hemorrhage, acute infarction or space-occupying mass lesion are noted. Vascular: No hyperdense vessel or unexpected calcification. Skull: Normal. Negative for fracture or focal lesion. Sinuses/Orbits: No acute finding. Other: None. IMPRESSION: Chronic changes stable from the previous exam. No acute intracranial abnormality is noted. This Electronically Signed   By: Alcide Clever M.D.   On: 03/27/2018 21:38   Dg Chest Portable 1 View  Result Date: 03/27/2018 CLINICAL DATA:  Generalized weakness EXAM: PORTABLE CHEST 1 VIEW COMPARISON:  08/25/2016 FINDINGS: Mild left lower lobe airspace disease may represent atelectasis or pneumonia. Right lung clear. Negative for heart failure or effusion. IMPRESSION: Mild left lower lobe atelectasis/infiltrate. Electronically Signed   By: Marlan Palau M.D.   On: 03/27/2018 19:59    Procedures Procedures (including critical care time)  Medications Ordered in ED Medications  lactated ringers infusion ( Intravenous New Bag/Given 03/28/18 0101)  latanoprost (XALATAN) 0.005 % ophthalmic solution 1 drop (has no administration in time range)  sulfacetamide (BLEPH-10) 10 % ophthalmic solution 1 drop (has no  administration in time range)  tobramycin (TOBREX) 0.3 % ophthalmic solution 1-2 drop (has no administration in time range)  acetaminophen (TYLENOL) tablet 650 mg (has no administration in time range)    Or  acetaminophen (TYLENOL) suppository 650 mg (has no administration in time range)  Warfarin - Pharmacist Dosing Inpatient (has no administration in time range)  warfarin (COUMADIN) tablet 1 mg (has no administration in time range)    And  warfarin (COUMADIN) tablet 2 mg (has no administration in time range)  sodium chloride 0.9 % bolus 1,000 mL (0 mLs Intravenous Stopped 03/27/18 2117)  cefTRIAXone (ROCEPHIN) 1 g in sodium chloride 0.9 % 100 mL IVPB (0 g Intravenous Stopped 03/28/18 0101)     Initial Impression / Assessment and Plan / ED Course  I have reviewed the triage vital signs and the nursing notes.  Pertinent labs & imaging results that were available during my care of the patient were reviewed by me and considered in my medical decision making (see chart for details).     84 year old female here with generalized weakness and reported syncopal episode.  Patient dehydrated clinically and reportedly has had little to no p.o. intake.  I had a long discussion with the family, and discussed with them that this is likely progression of her dementia and possibly end-of-life.  That being said, they state that she was significantly more pleasant and alert 2 weeks ago, and she does seem to have an acute change in her status.  Lab work shows significant dehydration with AKI.  Will start fluids.  They would prefer to have the patient admitted for fluids and palliative consultation, which I think is reasonable. UA pending. No signs of sepsis.  Of note, CXR shows possible atelectasis/inifltrate. Family denies cough, she is satting well, normal WOB - she's been essentially immobile, suspect this is atelectasis. Pulm toilet.   Of note, Eber Jones - daughter - available at [phone].   Final  Clinical  Impressions(s) / ED Diagnoses   Final diagnoses:  Near syncope  AKI (acute kidney injury) (HCC)  Acute cystitis without hematuria  Encephalopathy    ED Discharge Orders    None       Shaune Pollack, MD 03/28/18 0155

## 2018-03-27 NOTE — ED Triage Notes (Signed)
[facility] Pt was being transferred to chair by staff when pt had syncopal episode. Staff believed that pt had passed away at this time. Staff called pts dtr and EMS to pronounce pt "dead." EMS arrived, pt awake, nonverbal at baseline. Pt has dementia and appeared to be at her baseline. BP 120/100, HR88, 90% on room air, CBG 152. Pt reportedly has hx of syncopal episodes. Pt has DNR

## 2018-03-28 DIAGNOSIS — L8992 Pressure ulcer of unspecified site, stage 2: Secondary | ICD-10-CM

## 2018-03-28 DIAGNOSIS — G309 Alzheimer's disease, unspecified: Secondary | ICD-10-CM

## 2018-03-28 DIAGNOSIS — Z7189 Other specified counseling: Secondary | ICD-10-CM

## 2018-03-28 DIAGNOSIS — F0280 Dementia in other diseases classified elsewhere without behavioral disturbance: Secondary | ICD-10-CM

## 2018-03-28 DIAGNOSIS — R4182 Altered mental status, unspecified: Secondary | ICD-10-CM

## 2018-03-28 DIAGNOSIS — N179 Acute kidney failure, unspecified: Secondary | ICD-10-CM

## 2018-03-28 DIAGNOSIS — N3000 Acute cystitis without hematuria: Principal | ICD-10-CM

## 2018-03-28 DIAGNOSIS — Z515 Encounter for palliative care: Secondary | ICD-10-CM

## 2018-03-28 HISTORY — DX: Pressure ulcer of unspecified site, stage 2: L89.92

## 2018-03-28 LAB — HEMOGLOBIN A1C
Hgb A1c MFr Bld: 5.5 % (ref 4.8–5.6)
Mean Plasma Glucose: 111.15 mg/dL

## 2018-03-28 LAB — CBC
HCT: 38.4 % (ref 36.0–46.0)
Hemoglobin: 12.3 g/dL (ref 12.0–15.0)
MCH: 30.2 pg (ref 26.0–34.0)
MCHC: 32 g/dL (ref 30.0–36.0)
MCV: 94.3 fL (ref 80.0–100.0)
Platelets: 233 10*3/uL (ref 150–400)
RBC: 4.07 MIL/uL (ref 3.87–5.11)
RDW: 14.7 % (ref 11.5–15.5)
WBC: 10.1 10*3/uL (ref 4.0–10.5)
nRBC: 0 % (ref 0.0–0.2)

## 2018-03-28 LAB — LIPID PANEL
Cholesterol: 134 mg/dL (ref 0–200)
HDL: 41 mg/dL (ref >40)
LDL Cholesterol: 73 mg/dL (ref 0–99)
Total CHOL/HDL Ratio: 3.3 RATIO
Triglycerides: 100 mg/dL (ref <150)
VLDL: 20 mg/dL (ref 0–40)

## 2018-03-28 LAB — PROTIME-INR
INR: 2.64
Prothrombin Time: 27.8 seconds — ABNORMAL HIGH (ref 11.4–15.2)

## 2018-03-28 LAB — TSH: TSH: 0.792 u[IU]/mL (ref 0.350–4.500)

## 2018-03-28 LAB — BASIC METABOLIC PANEL
Anion gap: 11 (ref 5–15)
BUN: 55 mg/dL — ABNORMAL HIGH (ref 8–23)
CO2: 23 mmol/L (ref 22–32)
Calcium: 9 mg/dL (ref 8.9–10.3)
Chloride: 112 mmol/L — ABNORMAL HIGH (ref 98–111)
Creatinine, Ser: 2.13 mg/dL — ABNORMAL HIGH (ref 0.44–1.00)
GFR calc Af Amer: 23 mL/min — ABNORMAL LOW (ref >60)
GFR calc non Af Amer: 20 mL/min — ABNORMAL LOW (ref >60)
Glucose, Bld: 100 mg/dL — ABNORMAL HIGH (ref 70–99)
Potassium: 3.4 mmol/L — ABNORMAL LOW (ref 3.5–5.1)
Sodium: 146 mmol/L — ABNORMAL HIGH (ref 135–145)

## 2018-03-28 LAB — MRSA PCR SCREENING: MRSA by PCR: NEGATIVE

## 2018-03-28 MED ORDER — WARFARIN SODIUM 1 MG PO TABS
1.0000 mg | ORAL_TABLET | ORAL | Status: DC
  Filled 2018-03-28: qty 1

## 2018-03-28 MED ORDER — WARFARIN - PHARMACIST DOSING INPATIENT
Freq: Every day | Status: DC
  Administered 2018-03-30: 18:00:00

## 2018-03-28 MED ORDER — LATANOPROST 0.005 % OP SOLN
1.0000 [drp] | Freq: Every day | OPHTHALMIC | Status: DC
  Administered 2018-03-28 – 2018-03-31 (×5): 1 [drp] via OPHTHALMIC
  Filled 2018-03-28: qty 2.5

## 2018-03-28 MED ORDER — SULFACETAMIDE SODIUM 10 % OP SOLN
1.0000 [drp] | Freq: Four times a day (QID) | OPHTHALMIC | Status: DC
  Administered 2018-03-28 – 2018-04-01 (×16): 1 [drp] via OPHTHALMIC
  Filled 2018-03-28: qty 15

## 2018-03-28 MED ORDER — SODIUM CHLORIDE 0.9 % IV SOLN: 1.0000 g | INTRAVENOUS | Status: DC

## 2018-03-28 MED ORDER — ACETAMINOPHEN 650 MG RE SUPP: 650.0000 mg | Freq: Four times a day (QID) | RECTAL | Status: DC | PRN

## 2018-03-28 MED ORDER — ACETAMINOPHEN 325 MG PO TABS: 650.0000 mg | ORAL_TABLET | Freq: Four times a day (QID) | ORAL | Status: DC | PRN

## 2018-03-28 MED ORDER — WARFARIN SODIUM 2 MG PO TABS: 2.0000 mg | ORAL_TABLET | ORAL | Status: DC

## 2018-03-28 MED ORDER — WARFARIN SODIUM 1 MG PO TABS
1.0000 mg | ORAL_TABLET | Freq: Every day | ORAL | Status: DC
  Administered 2018-03-28 – 2018-03-31 (×4): 1 mg via ORAL
  Filled 2018-03-28 (×5): qty 1

## 2018-03-28 MED ORDER — TOBRAMYCIN 0.3 % OP SOLN
1.0000 [drp] | Freq: Four times a day (QID) | OPHTHALMIC | Status: DC
  Administered 2018-03-28: 2 [drp] via OPHTHALMIC
  Administered 2018-03-28 – 2018-03-30 (×11): 1 [drp] via OPHTHALMIC
  Administered 2018-03-31 – 2018-04-01 (×4): 2 [drp] via OPHTHALMIC
  Filled 2018-03-28: qty 5

## 2018-03-28 MED ORDER — CEPHALEXIN 500 MG PO CAPS
500.0000 mg | ORAL_CAPSULE | Freq: Two times a day (BID) | ORAL | Status: AC
  Administered 2018-03-28 – 2018-03-30 (×4): 500 mg via ORAL
  Filled 2018-03-28 (×5): qty 1

## 2018-03-28 MED ORDER — POTASSIUM CHLORIDE 10 MEQ/100ML IV SOLN
10.0000 meq | INTRAVENOUS | Status: AC
  Administered 2018-03-28 (×3): 10 meq via INTRAVENOUS
  Filled 2018-03-28 (×3): qty 100

## 2018-03-28 NOTE — Progress Notes (Signed)
Received report from Selena BattenKim, RN in ED for transfer of pt to [phone].

## 2018-03-28 NOTE — Progress Notes (Signed)
ANTICOAGULATION CONSULT NOTE -  Follow up Pharmacy Consult for Coumadin Indication: h/o PE  No Known Allergies  Patient Measurements: Weight: 159 lb 6.3 oz (72.3 kg)  Vital Signs: Temp: 98.3 F (36.8 C) (12/11 0525) Temp Source: Oral (12/11 0525) BP: 133/74 (12/11 0525) Pulse Rate: 94 (12/11 0525)  Labs: Recent Labs    03/27/18 2029 03/27/18 2030 03/28/18 0407  HGB 14.3 12.7 12.3  HCT 42.0 41.6 38.4  PLT  --  224 233  LABPROT  --  25.6* 27.8*  INR  --  2.38 2.64  CREATININE 2.80* 2.78* 2.13*    Estimated Creatinine Clearance: 16.4 mL/min (A) (by C-G formula based on SCr of 2.13 mg/dL (H)).   Medical History: Past Medical History:  Diagnosis Date  . Anemia   . Anxiety   . CHF (congestive heart failure) (HCC)   . Dementia (HCC)   . Hyperlipidemia   . Hypertension     Medications:  No current facility-administered medications on file prior to encounter.    Current Outpatient Medications on File Prior to Encounter  Medication Sig Dispense Refill  . acetaminophen (TYLENOL) 500 MG tablet Take 1,000 mg by mouth 3 (three) times daily.    [name]. albuterol (PROVENTIL) (2.5 MG/3ML) 0.083% nebulizer solution Take 2.5 mg by nebulization every 4 (four) hours as needed for wheezing or shortness of breath.    . ALPRAZolam (XANAX) 0.25 MG tablet Take 0.25 mg by mouth at bedtime as needed for anxiety.    . benzocaine-menthol (CHLORAEPTIC) 6-10 MG lozenge Take 1 lozenge by mouth as needed for sore throat.    . calcium carbonate (TUMS - DOSED IN MG ELEMENTAL CALCIUM) 500 MG chewable tablet Chew 2 tablets by mouth every 4 (four) hours as needed for indigestion or heartburn.    . cholecalciferol (VITAMIN D) 1000 UNITS tablet Take 1,000 Units by mouth daily at 8 pm.     . DULoxetine (CYMBALTA) 60 MG capsule Take 1 capsule (60 mg total) by mouth daily. One daily to help nerves (Patient taking differently: Take 60 mg by mouth daily. ) 30 capsule 6  . DULoxetine HCl 60 MG CSDR Take 60 mg by  mouth daily.    . ferrous sulfate 325 (65 FE) MG tablet Take 325 mg by mouth daily with breakfast.    . fluticasone-salmeterol (ADVAIR HFA) 115-21 MCG/ACT inhaler Inhale 2 puffs into the lungs 2 (two) times daily.    . furosemide (LASIX) 20 MG tablet Take 1 tablet (20 mg total) by mouth every other day. (Patient taking differently: Take 40 mg by mouth 2 (two) times daily. **take with 10meq. K+**) 30 tablet 5  . guaiFENesin-dextromethorphan (ROBITUSSIN DM) 100-10 MG/5ML syrup Take 15 mLs by mouth every 4 (four) hours as needed for cough.    . latanoprost (XALATAN) 0.005 % ophthalmic solution PLACE 1 DROP IN BOTH EYES AT BEDTIME TO TREAT GLAUCOMA (Patient taking differently: Place 1 drop into both eyes at bedtime. ) 7.5 mL 2  . loperamide (IMODIUM) 2 MG capsule Take 2 mg by mouth See admin instructions. Take one tablet by mouth with each loose stool up to 8 doses in 24hrs, if persistent diarrhea notify MD    . losartan (COZAAR) 100 MG tablet TAKE 1 TABLET BY MOUTH ONCE DAILY FOR BLOOD PRESSURE (Patient taking differently: Take 50 mg by mouth daily. ) 90 tablet 1  . memantine (NAMENDA) 10 MG tablet Take 1 tablet (10 mg total) by mouth 2 (two) times daily. 180 tablet 4  .  metoprolol (LOPRESSOR) 100 MG tablet TAKE 1 TABLET BY MOUTH TWICE DAILY TO CONTORL BLOOD PRESSURE (Patient taking differently: Take 50 mg by mouth 2 (two) times daily. ) 180 tablet 1  . potassium chloride (K-DUR) 10 MEQ tablet Take 10 mEq by mouth 2 (two) times daily. **Take with lasix**    . QUEtiapine (SEROQUEL) 25 MG tablet Take 25 mg by mouth 2 (two) times daily.    . ranitidine (ZANTAC) 300 MG tablet Take 300 mg by mouth at bedtime.     . sennosides-docusate sodium (SENOKOT-S) 8.6-50 MG tablet Take 2 tablets by mouth daily as needed for constipation.     . vitamin C (ASCORBIC ACID) 500 MG tablet Take 500 mg by mouth daily. Give on an empty stomach along with iron 325mg  2 hrs before a meal    . warfarin (COUMADIN) 1 MG tablet Take 1  mg by mouth daily.     . [DISCONTINUED] doxycycline (VIBRA-TABS) 100 MG tablet Take 100 mg by mouth 2 (two) times daily.    . [DISCONTINUED] latanoprost (XALATAN) 0.005 % ophthalmic solution Place 1 drop into both eyes at bedtime.    . [DISCONTINUED] meclizine (ANTIVERT) 12.5 MG tablet Take 12.5 mg by mouth 3 (three) times daily as needed for nausea.     . [DISCONTINUED] mirtazapine (REMERON) 15 MG tablet Take 15 mg by mouth at bedtime.    . [DISCONTINUED] sulfacetamide (BLEPH-10) 10 % ophthalmic solution Place 1 drop into the right eye 4 (four) times daily.    . [DISCONTINUED] tobramycin (TOBREX) 0.3 % ophthalmic solution Place 1-2 drops into both eyes 4 (four) times daily.      Assessment: 84 y.o. female admitted 03/27/18 with AMS/UTI, started on IV ceftriaxone.  She has  h/o PE, and is on  Coumadin PTA at dose of 1 mg daily, Last dose pta taken on 03/27/09 per SNF MAR.  Coumadin to continue.   Today INR remains therapeutic, INR 2.64. CBC is within normal. No bleeding reported.   Goal of Therapy:  INR 2-3 Monitor platelets by anticoagulation protocol: Yes   Plan:  Continue home Coumadin regimen 1 mg daily.  Daily INR  Noah Delaine, RPh Clinical Pharmacist X [phone] or Please check AMION for all [facility] phone numbers After 10:00 PM, call Main Pharmacy [phone] 03/28/2018,1:58 PM

## 2018-03-28 NOTE — Progress Notes (Signed)
Family Medicine Teaching Service Daily Progress Note Intern Pager: [phone]  Patient name: Kristin Bullocksnn T Sobol Medical record number: [id number] Date of birth: 09/27/1925 Age: 84 y.o. Gender: female  Primary Care Provider: Sherron Mondayejan-Sie, S Ahmed, MD Consultants: palliative Code Status: DNR  Pt Overview and Major Events to Date:  12/10 admit for AMS and AKI  Assessment and Plan: Kristin Wilkerson is a 84 y.o. female presenting with worsening mental status for the past two weeks in the setting of Alzheimer's disease and very poor oral intake, found to have an acute kidney injury and UTI secondary to dehydration.  PMH is significant for Alzheimer's dementia, hypertension, previous bilateral PE on warfarin therapy, previous CVA, normocytic anemia, anxiety/depression.  Acute AMS in the setting of progressive Alzheimer's, dehydration and UTI - improving Physical exam this morning appears to be improved from admission.  Patient is no longer babbling but speaking with words and partial and full sentences.  She is clearly disoriented although unclear what her baseline is.  Etiology likely multifactorial.  She appears to have some element of Alzheimer's disease in addition to dehydration, UTI in addition to atelectasis (low suspicion for pneumonia).  Head CT unremarkable, initial troponin unremarkable.  Risk stratification labs as follows: Profile: Total cholesterol 134, LDL 73; A1c 5.5; TSH 0.79.  No further work-up warranted at this time.  Patient and family would benefit from goals of care discussion with palliative care as patient appears to be progressively declining. -Treat UTI as below -Continue mIVF, 100 cc/hr LR reassess given CHF -Palliative consult in the a.m., family in agreement -PT/OT   UTI -on presentation in the ED, patient had a UA positive for large leukocytes.  She was given 1 dose of ceftriaxone in the ED. -Start Keflex for the next 4 days, for a total of 5 days treatment  AKI due to  dehydration Admission creatinine: 2.78, daughter reported poor p.o. intake at home.  He received a total of 1.3 L since admission.  Creatinine on the morning of 12/11 appears improved at 2.13.  No UOP since hospital admission.  Orthostatic vitals recorded. -Continue mIVF at 100 mL/hour LR -Orthostatic vital signs -Daily weights, strict I and O's --Hold lasix and losartan --Avoid nephrotoxic agents  Low BPs in the setting of Chronic hypertension:  Overnight systolics from 102-133, diastolics from 72-74. Patient takes metoprolol 100 mg twice daily and losartan 50 mg daily for blood pressure control. -Monitor BP -IVF as above -Hold home metoprolol and losartan in the setting of low blood pressures, AKI, n.p.o  Stage I decubitus ulcer on sacrum -ulcer noted by nursing on admission. -Wound care per nursing  R knee Wound: Chronic, stable.  ~ 1cm fairly superficial ulceration above knee with minor surrounding erythema. Developed after several falls on location in the setting of knee replacement in 2016/2017. Low concern for infection at this time, has been following with wound changes at her nursing home. Image in physical exam.  -Consulted wound care - Monitor area for changes  HFpEF: Chronic, Stable.  Echo in 08/2016 showing EF 60-65%, G1DD.  Reported she is taking Lasix 40 mg twice daily, however prescribed as 20 mg every other day. On last hospital discharge 08/2016 patient was sent home on 40 mg daily.   - Monitor BP - Hold Lasix in the setting of AKI and fluid rehydration - Verify Lasix dose taking at home in the a.m.  Alzheimer's Dementia: Chronic, worsening. [facility] [place]. Takes memantine and Remeron. -Holding memantine and  Remeron due to NPO status. -Monitor mental status  Previous bilateral PE: Chronic, stable. -Continue home warfarin per pharmacy  Anxiety/Depression: Chronic, stable. Takes duloxetine at home. - Hold home duloxetine  in setting of NPO  Previous CVA: Chronic, stable.   Proximally about 6 years ago (in 2014), CT head on admit showing stable prior infarcts in the right occipital lobe.  No residual deficits per family, however wheelchair-bound. - Monitor for any focal deficits  FEN/GI: N.p.o., 100 mL/hr LR Prophylaxis: Warfarin per pharmacy, currently on SCDs  [facility]   Subjective:  Not fully oriented this morning.  Though would not stress this patient to convey her concerns, she did not express any pains or discomfort this morning.  Objective: Temp:  [97.7 F (36.5 C)-98.4 F (36.9 C)] 98.3 F (36.8 C) (12/11 0525) Pulse Rate:  [49-94] 94 (12/11 0525) Resp:  [12-21] 16 (12/11 0525) BP: (93-133)/(53-104) 133/74 (12/11 0525) SpO2:  [90 %-100 %] 96 % (12/11 0525) Weight:  [72.3 kg] 72.3 kg (12/11 0155)  Physical Exam: General: Alert and responsive oriented x0.  Patient could respond with words clearly did not understand who she was, where she was what was going on. Cardio: Normal A1 and S2, no S3 or S4. Rhythm is regular. No murmurs or rubs.   Pulm: Clear to auscultation bilaterally, no crackles, wheezing, or diminished breath sounds. Normal respiratory effort Abdomen: Bowel sounds normal. Abdomen soft and non-tender.  Extremities: No peripheral edema. Warm/ well perfused.  Strong radial pulses. Neuro: Cranial nerves grossly intact.  5/5 strength in upper extremities.  The remainder of the neurological exam is difficult to perform due to difficulty following commands.    Laboratory: Recent Labs  Lab 03/27/18 2029 03/27/18 2030 03/28/18 0407  WBC  --  11.1* 10.1  HGB 14.3 12.7 12.3  HCT 42.0 41.6 38.4  PLT  --  224 233   Recent Labs  Lab 03/27/18 2029 03/27/18 2030 03/28/18 0407  NA 144 144 146*  K 4.2 4.3 3.4*  CL 111 109 112*  CO2  --  24 23  BUN 60* 69* 55*  CREATININE 2.80* 2.78* 2.13*  CALCIUM  --  9.5 9.0  PROT  --   6.6  --   BILITOT  --  0.9  --   ALKPHOS  --  117  --   ALT  --  18  --   AST  --  21  --   GLUCOSE 106* 107* 100*      Imaging/Diagnostic Tests: Ct Head Wo Contrast  Result Date: 03/27/2018 CLINICAL DATA:  Recent syncopal episode EXAM: CT HEAD WITHOUT CONTRAST TECHNIQUE: Contiguous axial images were obtained from the base of the skull through the vertex without intravenous contrast. COMPARISON:  08/22/2016 FINDINGS: Brain: Atrophic changes are noted. Findings consistent with prior infarcts in right occipital lobe inferiorly is seen. No findings to suggest acute hemorrhage, acute infarction or space-occupying mass lesion are noted. Vascular: No hyperdense vessel or unexpected calcification. Skull: Normal. Negative for fracture or focal lesion. Sinuses/Orbits: No acute finding. Other: None. IMPRESSION: Chronic changes stable from the previous exam. No acute intracranial abnormality is noted. This Electronically Signed   By: Alcide Clever M.D.   On: 03/27/2018 21:38   Dg Chest Portable 1 View  Result Date: 03/27/2018 CLINICAL DATA:  Generalized weakness EXAM: PORTABLE CHEST 1 VIEW COMPARISON:  08/25/2016 FINDINGS: Mild left lower lobe airspace disease may represent atelectasis or pneumonia. Right lung clear. Negative for heart  failure or effusion. IMPRESSION: Mild left lower lobe atelectasis/infiltrate. Electronically Signed   By: Marlan Palau M.D.   On: 03/27/2018 19:59     Mirian Mo, MD 03/28/2018, 6:19 AM PGY-1,  Family Medicine FPTS Intern pager: [phone], text pages welcome

## 2018-03-28 NOTE — Progress Notes (Signed)
Unable to complete orthostatic vitals, pt not following commands and unable to stand.

## 2018-03-28 NOTE — Consult Note (Signed)
WOC Nurse wound consult note [facility] [id number].  No family present. Reason for Consult: Right knee wound that the patient stated occurred when she "hurt herself"; the etiology is unclear to me and does not appear as a typical abrasion.  The two day old gauze dressing is heavily soiled with light brown drainage. Wound type: Presumed trauma Pressure Injury POA: Yes Measurement: 1.6 cm x 1.6 cm Wound bed: pink, slick.  The wound border has a perimeter of black cells along the epithelial border. Drainage (amount, consistency, odor) No odor. Periwound: normal color and texture skin. Dressing procedure/placement/frequency: Cleanse right knee wound with soap and water. Pat dry. Place a dry gauze over the wound.  Tape in place, change each shift. Monitor the wound area(s) for worsening of condition such as: Signs/symptoms of infection,  Increase in size,  Development of or worsening of odor, Development of pain, or increased pain at the affected locations.  Notify the medical team if any of these develop.  Thank you for the consult.  Discussed plan of care with the patient and bedside nurse.  WOC nurse will not follow at this time.  Please re-consult the WOC team if needed.  Helmut MusterSherry Lakira Ogando, RN, MSN, CWOCN, CNS-BC, pager [phone]

## 2018-03-28 NOTE — Evaluation (Signed)
Physical Therapy Evaluation Patient Details Name: Kristin Bullocksnn T See MRN: [id number] DOB: 02/13/1926 Today's Date: 03/28/2018   History of Present Illness  Pt is a 84 year old female with history of CHF, dementia, and hypertension. She presented from her SNF following syncopal episode. Per report from the nursing facility, the patient was lifted up today to transfer to a chair and reportedly lost consciousness.  They initially thought the patient and passed away.  She then regained consciousness and is now back to her baseline.     Clinical Impression  Pt admitted with above diagnosis. Pt currently with functional limitations due to the deficits listed below (see PT Problem List). PTA pt resided in SNF and was wheelchair bound, requiring assist for transfers.  On eval, she required max assist bed mobility.  Unable to progress beyond EOB due to confusion. Pt will benefit from skilled PT to increase their independence and safety with mobility to allow discharge to the venue listed below.       Follow Up Recommendations SNF    Equipment Recommendations  None recommended by PT    Recommendations for Other Services       Precautions / Restrictions Precautions Precautions: Fall Restrictions Weight Bearing Restrictions: No      Mobility  Bed Mobility Overal bed mobility: Needs Assistance Bed Mobility: Supine to Sit;Sit to Supine     Supine to sit: HOB elevated;Max assist Sit to supine: HOB elevated;Max assist   General bed mobility comments: +2 max assist for repositioning in bed   Transfers Overall transfer level: Needs assistance               General transfer comment: Attempted sit to stand from EOB. Unable to clear bottom with max assist due to pt resisting. Pt with dementia and unable to follow commands.   Ambulation/Gait             General Gait Details: nonambulatory  Stairs            Wheelchair Mobility    Modified Rankin (Stroke Patients Only)        Balance Overall balance assessment: Needs assistance Sitting-balance support: Feet supported;Single extremity supported Sitting balance-Leahy Scale: Fair Sitting balance - Comments: Pt sat EOB x 5 minutes min guard assist.                                     Pertinent Vitals/Pain Pain Assessment: Faces Faces Pain Scale: No hurt    Home Living Family/patient expects to be discharged to:: Skilled nursing facility                 Additional Comments: from SNF and returning to SNF    Prior Function Level of Independence: Needs assistance   Gait / Transfers Assistance Needed: assist for transfers to wheelchair  ADL's / Homemaking Assistance Needed: SNF resident  Comments: history taken from chart as pt is unable to provide information     Hand Dominance        Extremity/Trunk Assessment   Upper Extremity Assessment Upper Extremity Assessment: Generalized weakness    Lower Extremity Assessment Lower Extremity Assessment: Generalized weakness    Cervical / Trunk Assessment Cervical / Trunk Assessment: Kyphotic  Communication   Communication: HOH  Cognition Arousal/Alertness: Awake/alert Behavior During Therapy: WFL for tasks assessed/performed Overall Cognitive Status: No family/caregiver present to determine baseline cognitive functioning  General Comments: dementia at baseline. Frequently speaking gibberish and laughing. Alert but disoriented to self, place, time. Unable to follow commands.       General Comments      Exercises     Assessment/Plan    PT Assessment Patient needs continued PT services  PT Problem List Decreased strength;Decreased mobility;Decreased activity tolerance;Decreased balance       PT Treatment Interventions Therapeutic activities;Therapeutic exercise;Balance training;Functional mobility training;Patient/family education    PT Goals (Current goals can be  found in the Care Plan section)  Acute Rehab PT Goals Patient Stated Goal: not stated PT Goal Formulation: Patient unable to participate in goal setting Time For Goal Achievement: 04/11/18 Potential to Achieve Goals: Fair    Frequency Min 2X/week   Barriers to discharge        Co-evaluation               AM-PAC PT "6 Clicks" Mobility  Outcome Measure Help needed turning from your back to your side while in a flat bed without using bedrails?: A Lot Help needed moving from lying on your back to sitting on the side of a flat bed without using bedrails?: A Lot Help needed moving to and from a bed to a chair (including a wheelchair)?: A Lot Help needed standing up from a chair using your arms (e.g., wheelchair or bedside chair)?: A Lot Help needed to walk in hospital room?: Total Help needed climbing 3-5 steps with a railing? : Total 6 Click Score: 10    End of Session Equipment Utilized During Treatment: Gait belt Activity Tolerance: Treatment limited secondary to medical complications (Comment)(dementia/AMS) Patient left: in bed;with call bell/phone within reach;with bed alarm set Nurse Communication: Mobility status PT Visit Diagnosis: Other abnormalities of gait and mobility (R26.89)    Time: 1610-9604 PT Time Calculation (min) (ACUTE ONLY): 12 min   Charges:   PT Evaluation $PT Eval Low Complexity: 1 Low          Aida Raider, PT  Office # [phone] Pager [phone]   Ilda Foil 03/28/2018, 12:00 PM

## 2018-03-28 NOTE — Progress Notes (Signed)
ANTICOAGULATION CONSULT NOTE - Initial Consult  Pharmacy Consult for Coumadin Indication: h/o PE  No Known Allergies  Patient Measurements:    Vital Signs: Temp: 97.7 F (36.5 C) (12/10 1850) Temp Source: Oral (12/10 1850) BP: 111/85 (12/11 0101) Pulse Rate: 90 (12/11 0101)  Labs: Recent Labs    03/27/18 2029 03/27/18 2030  HGB 14.3 12.7  HCT 42.0 41.6  PLT  --  224  LABPROT  --  25.6*  INR  --  2.38  CREATININE 2.80* 2.78*    CrCl cannot be calculated (Unknown ideal weight.).   Medical History: Past Medical History:  Diagnosis Date  . Anemia   . Anxiety   . CHF (congestive heart failure) (HCC)   . Dementia (HCC)   . Hyperlipidemia   . Hypertension     Medications:  No current facility-administered medications on file prior to encounter.    Current Outpatient Medications on File Prior to Encounter  Medication Sig Dispense Refill  . acetaminophen (TYLENOL) 500 MG tablet Take 1,000 mg by mouth 3 (three) times daily.    [name]. albuterol (PROVENTIL) (2.5 MG/3ML) 0.083% nebulizer solution Take 2.5 mg by nebulization every 4 (four) hours as needed for wheezing or shortness of breath.    . calcium carbonate (TUMS - DOSED IN MG ELEMENTAL CALCIUM) 500 MG chewable tablet Chew 2 tablets by mouth every 4 (four) hours as needed for indigestion or heartburn.    . cholecalciferol (VITAMIN D) 1000 UNITS tablet Take 1,000 Units by mouth daily at 8 pm.     . doxycycline (VIBRA-TABS) 100 MG tablet Take 100 mg by mouth 2 (two) times daily.    . DULoxetine (CYMBALTA) 60 MG capsule Take 1 capsule (60 mg total) by mouth daily. One daily to help nerves 30 capsule 6  . DULoxetine HCl 60 MG CSDR Take 60 mg by mouth daily.    . fluticasone-salmeterol (ADVAIR HFA) 115-21 MCG/ACT inhaler Inhale 2 puffs into the lungs 2 (two) times daily.    . furosemide (LASIX) 20 MG tablet Take 1 tablet (20 mg total) by mouth every other day. (Patient taking differently: Take 40 mg by mouth 2 (two) times  daily. ) 30 tablet 5  . guaiFENesin-dextromethorphan (ROBITUSSIN DM) 100-10 MG/5ML syrup Take 15 mLs by mouth every 4 (four) hours as needed for cough.    . latanoprost (XALATAN) 0.005 % ophthalmic solution PLACE 1 DROP IN BOTH EYES AT BEDTIME TO TREAT GLAUCOMA 7.5 mL 2  . latanoprost (XALATAN) 0.005 % ophthalmic solution Place 1 drop into both eyes at bedtime.    [name]. losartan (COZAAR) 100 MG tablet TAKE 1 TABLET BY MOUTH ONCE DAILY FOR BLOOD PRESSURE (Patient taking differently: TAKE 50 MG BY MOUTH ONCE DAILY FOR BLOOD PRESSURE) 90 tablet 1  . meclizine (ANTIVERT) 12.5 MG tablet Take 12.5 mg by mouth 3 (three) times daily as needed for nausea.     . memantine (NAMENDA) 10 MG tablet Take 1 tablet (10 mg total) by mouth 2 (two) times daily. 180 tablet 4  . metoprolol (LOPRESSOR) 100 MG tablet TAKE 1 TABLET BY MOUTH TWICE DAILY TO CONTORL BLOOD PRESSURE (Patient taking differently: TAKE 100 MG BY MOUTH TWICE DAILY TO CONTORL BLOOD PRESSURE) 180 tablet 1  . mirtazapine (REMERON) 15 MG tablet Take 15 mg by mouth at bedtime.    . ranitidine (ZANTAC) 150 MG tablet Take 150 mg by mouth 2 (two) times daily.    . sennosides-docusate sodium (SENOKOT-S) 8.6-50 MG tablet Take 2 tablets by mouth  daily as needed for constipation.     . sulfacetamide (BLEPH-10) 10 % ophthalmic solution Place 1 drop into the right eye 4 (four) times daily.    [name] tobramycin (TOBREX) 0.3 % ophthalmic solution Place 1-2 drops into both eyes 4 (four) times daily.    [name] warfarin (COUMADIN) 1 MG tablet Take 1-2 mg by mouth See admin instructions. Taking 1 mg on Mon, Wed, Fri  -- Taking 2 mg on Sun, Tue, Thurs, Sat daily at 4:30 pm      Assessment: 84 y.o. female admitted with AMS/UTI, h/o PE, to continue Coumadin  Goal of Therapy:  INR 2-3 Monitor platelets by anticoagulation protocol: Yes   Plan:  Continue home Coumadin regimen Daily INR  Takashi Korol, Gary Fleet 03/28/2018,1:11 AM

## 2018-03-28 NOTE — Consult Note (Signed)
Consultation Note Date: 03/28/2018   Patient Name: Kristin Wilkerson  DOB: 11/29/1925  MRN: [id number]  Age / Sex: 84 y.o., female  PCP: Jodi Marble, MD Referring Physician: No att. providers found  Reason for Consultation: Establishing goals of care  HPI/Patient Profile: 84 y.o. female  with past medical history of dementia, back pain, stroke, heart failure, bilateral knee replacements, HTN, bilateral PE on warfarin, anemia, chronic R knee wound, anxiety, and depression wound admitted on 03/27/2018 with AMS and poor PO intake. Found to be dehydrated, AKI, and with UTI.  Head CT unremarkable, initial troponin unremarkable. PMT consulted for [place] d/t patient's progressive decline.  Clinical Assessment and Goals of Care: I have reviewed medical records including EPIC notes, labs and imaging, assessed the patient and then met with patient's daughter, Hoyle Sauer, to discuss diagnosis prognosis, GOC, EOL wishes, disposition and options.  Patient unable to participate in discussion d/t advanced dementia. Patient's daughter, Hoyle Sauer, is documented HCPOA.   I introduced Palliative Medicine as specialized medical care for people living with serious illness. It focuses on providing relief from the symptoms and stress of a serious illness. The goal is to improve quality of life for both the patient and the family. Family familiar with PMT as they had met with [place] in 2017.   We discussed a brief life review of the patient. Hoyle Sauer tells me the patient has been living at [facility] for about 3 years. Patient's spouse passed about 4 years ago. She has 3 children. Hoyle Sauer tells me patient has been declining since death of her spouse.  As far as functional and nutritional status, patient is wheelchair bound and requires assistance for all ADLs. She tells me patient spends most of her time in her recliner. Patient's appetite is declining, sometimes refusing intake  and dependent on others for eating/drinking.   Hoyle Sauer tells me that patient's current mental status is better than baseline at facility - patient spends most of her time not interactive. When she does speak, she is disoriented and does not recognize family members.    We discussed her current illness and what it means in the larger context of her on-going co-morbidities.  Natural disease trajectory and expectations at EOL were discussed. Discussed progression of dementia, poor PO intake.   Hoyle Sauer tells me she knows patient is nearing end of life. She tells me patient has been talking about deceased loved ones and how she is "ready to be with them".   I attempted to elicit values and goals of care important to the patient.  Hoyle Sauer wants to focus on quality of life, comfort, and maintaining dignity. She wants to keep patient at [facility] where patient is comfortable and familiar.  The difference between aggressive medical intervention and comfort care was considered in light of the patient's goals of care. Family agrees we should focus on comfort and not aggressive medical interventions. Agree to continue current care with IV fluids and antibiotics, but once patient is medically stabilized return to [facility] and attempt to avoid further hospitalizations by focusing on comfort with support of Hospice care.   Advance directives, concepts specific to code status, artifical feeding and hydration, and rehospitalization were considered and discussed. Patient is DNR. Would not want life artificially prolonged.   Hospice and Palliative Care services outpatient were explained and offered. Family agrees to hospice care at [facility]. Will consult social work.  Questions and concerns were addressed.  Hard Choices booklet left for review. The family was encouraged to call  with questions or concerns.   Primary Decision Maker HCPOA/daughter - Judie Bonus    SUMMARY OF RECOMMENDATIONS   - continue  current care with IV fluids and antibiotics - once patient is medically stabilized return to [facility] and attempt to avoid further hospitalizations by focusing on comfort with support of Hospice care - consult CSW for hosp[facility] care at [facility] - education completed with family about progression of dementia, expectations - focus of care should be comfort, quality of life, dignity  Code Status/Advance Care Planning:  DNR  Palliative Prophylaxis:   Aspiration and Frequent Pain Assessment  Additional Recommendations (Limitations, Scope, Preferences):  Avoid Hospitalization and Minimize Medications  Psycho-social/Spiritual:   Desire for further Chaplaincy support:no  Additional Recommendations: Education on Hospice  Prognosis:   < 6 months  Discharge Planning: [place] with Hospice      Primary Diagnoses: Present on Admission: . Altered mental status   I have reviewed the medical record, interviewed the patient and family, and examined the patient. The following aspects are pertinent.  Past Medical History:  Diagnosis Date  . Anemia   . Anxiety   . CHF (congestive heart failure) ([place])   . Dementia ([place])   . Hyperlipidemia   . Hypertension    Social History   Socioeconomic History  . Marital status: Widowed    Spouse name: Not on file  . Number of children: 3  . Years of education: 12th  . Highest education level: Not on file  Occupational History  . Occupation: retired  Scientific laboratory technician  . Financial resource strain: Not on file  . Food insecurity:    Worry: Not on file    Inability: Not on file  . Transportation needs:    Medical: Not on file    Non-medical: Not on file  Tobacco Use  . Smoking status: Never Smoker  . Smokeless tobacco: Never Used  Substance and Sexual Activity  . Alcohol use: No  . Drug use: No  . Sexual activity: Never  Lifestyle  . Physical activity:    Days per week: Not on file    Minutes per session: Not on file  .  Stress: Not on file  Relationships  . Social connections:    Talks on phone: Not on file    Gets together: Not on file    Attends religious service: Not on file    Active member of club or organization: Not on file    Attends meetings of clubs or organizations: Not on file    Relationship status: Not on file  Other Topics Concern  . Not on file  Social History Narrative  . Not on file   Family History  Problem Relation Age of Onset  . Lung cancer Sister   . Stroke Mother   . Stroke Father    Scheduled Meds: . cephALEXin  500 mg Oral Q12H  . latanoprost  1 drop Both Eyes QHS  . sulfacetamide  1 drop Right Eye QID  . tobramycin  1-2 drop Both Eyes QID  . warfarin  1 mg Oral q1800  . Warfarin - Pharmacist Dosing Inpatient   Does not apply q1800   Continuous Infusions: . cefTRIAXone (ROCEPHIN)  IV    . lactated ringers 100 mL/hr at 03/28/18 0101   PRN Meds:.acetaminophen **OR** acetaminophen No Known Allergies Review of Systems  Unable to perform ROS: Dementia    Physical Exam  Constitutional: No distress.  HENT:  Head: Normocephalic and atraumatic.  Cardiovascular: Normal  rate and regular rhythm.  Pulmonary/Chest: Effort normal and breath sounds normal.  Abdominal: Soft.  Neurological: She is alert. She is disoriented.  Skin: Skin is warm and dry. She is not diaphoretic.  Psychiatric: Cognition and memory are impaired.    Vital Signs: BP 133/74 (BP Location: Right Arm)   Pulse 94   Temp 98.3 F (36.8 C) (Oral)   Resp 16   Wt 72.3 kg   SpO2 96%   BMI 29.15 kg/m  Pain Scale: PAINAD       SpO2: SpO2: 96 % O2 Device:SpO2: 96 % O2 Flow Rate: .   IO: Intake/output summary:   Intake/Output Summary (Last 24 hours) at 03/28/2018 1443 Last data filed at 03/28/2018 0301 Gross per 24 hour  Intake 1299.58 ml  Output -  Net 1299.58 ml    LBM:   Baseline Weight: Weight: 72.3 kg Most recent weight: Weight: 72.3 kg     Palliative Assessment/Data: PPS  30%    Time Total: 70 minutes Greater than 50%  of this time was spent counseling and coordinating care related to the above assessment and plan.  Juel Burrow, DNP, AGNP-C Palliative Medicine Team [phone] Pager: [phone]

## 2018-03-28 NOTE — Progress Notes (Signed)
NURSING PROGRESS NOTE  Carey Bullocksnn T Caraveo [id number] Admission Data: 03/28/2018 2:17 AM Attending Provider: No att. providers found JYN:[id number]:Tejan-Sie, Marcelino FreestoneS Ahmed, MD Code Status: DNR  Allergies:  Patient has no known allergies. Past Medical History:   has a past medical history of Anemia, Anxiety, CHF (congestive heart failure) (HCC), Dementia (HCC), Hyperlipidemia, and Hypertension. Past Surgical History:   has a past surgical history that includes Lumbar laminectomy (12/04/2012); Abdominal hysterectomy; Cholecystectomy; Cardiovascular stress test (11/16/2009); transthoracic echocardiogram (09/01/2011); Bilateral oophorectomy; Total knee arthroplasty (Right, 02/11/2015); I&D knee with poly exchange (Right, 04/21/2015); I&D RIGHT KNEE WITH PATELLECTOMY (04/21/2015); and Esophagogastroduodenoscopy (N/A, 08/27/2016). Social History:   reports that she has never smoked. She has never used smokeless tobacco. She reports that she does not drink alcohol or use drugs.  Carey Bullocksnn T Dirr is a 84 y.o. female patient admitted from ED:   Last Documented Vital Signs: Blood pressure 102/72, pulse (!) 49, temperature 98.4 F (36.9 C), temperature source Oral, resp. rate 12, weight 72.3 kg, SpO2 100 %.  Cardiac Monitoring: Box # 13 in place. Cardiac monitor yields:normal sinus rhythm.  IV Fluids:  IV in place, occlusive dsg intact without redness, IV cath forearm right, condition patent and no redness and left, condition patent and no redness and antecubital left, condition patent and no redness lactated Ringer's.   Skin: Stage 1 to buttocks, stage 2 sacrum, wound to Right knee, redness noted to bilateral heels and toes.  Patient/Family orientated to room. Information packet given to patient/family. Admission inpatient armband information verified with patient/family to include name and date of birth and placed on patient arm. Side rails up x 2, fall assessment and education completed with patient/family. Patient/family able to  verbalize understanding of risk associated with falls and verbalized understanding to call for assistance before getting out of bed. Call light within reach. Patient/family able to voice and demonstrate understanding of unit orientation instructions.    Will continue to evaluate and treat per MD orders.   Judee Clararimaine Mercedees Convery, RN

## 2018-03-29 DIAGNOSIS — L8990 Pressure ulcer of unspecified site, unspecified stage: Secondary | ICD-10-CM

## 2018-03-29 DIAGNOSIS — G9340 Encephalopathy, unspecified: Secondary | ICD-10-CM | POA: Diagnosis present

## 2018-03-29 LAB — CBC
HEMATOCRIT: 37.4 % (ref 36.0–46.0)
Hemoglobin: 11.6 g/dL — ABNORMAL LOW (ref 12.0–15.0)
MCH: 29.7 pg (ref 26.0–34.0)
MCHC: 31 g/dL (ref 30.0–36.0)
MCV: 95.7 fL (ref 80.0–100.0)
Platelets: 220 10*3/uL (ref 150–400)
RBC: 3.91 MIL/uL (ref 3.87–5.11)
RDW: 15 % (ref 11.5–15.5)
WBC: 10.2 10*3/uL (ref 4.0–10.5)
nRBC: 0 % (ref 0.0–0.2)

## 2018-03-29 LAB — BASIC METABOLIC PANEL
Anion gap: 10 (ref 5–15)
BUN: 28 mg/dL — ABNORMAL HIGH (ref 8–23)
CO2: 24 mmol/L (ref 22–32)
CREATININE: 1.43 mg/dL — AB (ref 0.44–1.00)
Calcium: 8.7 mg/dL — ABNORMAL LOW (ref 8.9–10.3)
Chloride: 113 mmol/L — ABNORMAL HIGH (ref 98–111)
GFR calc Af Amer: 37 mL/min — ABNORMAL LOW (ref >60)
GFR calc non Af Amer: 32 mL/min — ABNORMAL LOW (ref >60)
Glucose, Bld: 86 mg/dL (ref 70–99)
Potassium: 3.4 mmol/L — ABNORMAL LOW (ref 3.5–5.1)
Sodium: 147 mmol/L — ABNORMAL HIGH (ref 135–145)

## 2018-03-29 LAB — MAGNESIUM: Magnesium: 1.6 mg/dL — ABNORMAL LOW (ref 1.7–2.4)

## 2018-03-29 LAB — PHOSPHORUS: PHOSPHORUS: 1.8 mg/dL — AB (ref 2.5–4.6)

## 2018-03-29 LAB — PROTIME-INR
INR: 2.48
Prothrombin Time: 26.5 seconds — ABNORMAL HIGH (ref 11.4–15.2)

## 2018-03-29 MED ORDER — MAGNESIUM SULFATE 2 GM/50ML IV SOLN
2.0000 g | Freq: Once | INTRAVENOUS | Status: AC
  Administered 2018-03-29: 2 g via INTRAVENOUS
  Filled 2018-03-29: qty 50

## 2018-03-29 MED ORDER — METOPROLOL TARTRATE 25 MG PO TABS
25.0000 mg | ORAL_TABLET | Freq: Two times a day (BID) | ORAL | Status: DC
  Administered 2018-03-29 – 2018-03-30 (×3): 25 mg via ORAL
  Filled 2018-03-29 (×3): qty 1

## 2018-03-29 MED ORDER — SODIUM CHLORIDE 0.45 % IV SOLN
INTRAVENOUS | Status: DC
  Administered 2018-03-29 – 2018-03-30 (×2): via INTRAVENOUS

## 2018-03-29 MED ORDER — POTASSIUM PHOSPHATES 15 MMOLE/5ML IV SOLN
10.0000 mmol | Freq: Once | INTRAVENOUS | Status: AC
  Administered 2018-03-29: 10 mmol via INTRAVENOUS
  Filled 2018-03-29: qty 3.33

## 2018-03-29 MED ORDER — SODIUM CHLORIDE 0.9 % IV SOLN
INTRAVENOUS | Status: DC | PRN
  Administered 2018-03-29: 100 mL via INTRAVENOUS

## 2018-03-29 MED ORDER — POTASSIUM CHLORIDE CRYS ER 20 MEQ PO TBCR
30.0000 meq | EXTENDED_RELEASE_TABLET | Freq: Two times a day (BID) | ORAL | Status: DC
  Administered 2018-03-29: 30 meq via ORAL
  Filled 2018-03-29: qty 1

## 2018-03-29 NOTE — Progress Notes (Signed)
Family Medicine Teaching Service Daily Progress Note Intern Pager: [phone]  Patient name: Kristin Wilkerson Medical record number: [id number] Date of birth: October 31, 1925 Age: 84 y.o. Gender: female  Primary Care Provider: Sherron Mondayejan-Sie, S Ahmed, MD Consultants: palliative Code Status: DNR  Pt Overview and Major Events to Date:  12/10 admit for AMS and AKI  Assessment and Plan: Kristin Wilkerson is a 84 y.o. female presenting with worsening mental status for the past two weeks in the setting of Alzheimer's disease and very poor oral intake, found to have an acute kidney injury and UTI secondary to dehydration.  PMH is significant for Alzheimer's dementia, hypertension, previous bilateral PE on warfarin therapy, previous CVA, normocytic anemia, anxiety/depression.  Acute AMS in the setting of progressive Alzheimer's, dehydration and UTI - improving We are significantly improved from admission.  Per palliative discussion with family, plan is to return patient to assisted living facility following recovery from this hospitalization.  At that time should be put under hospice care.  The family does not wish for any further hospitalizations and agrees to comfort care from that point is appropriate.  PT recommends SNF.  UTI -on presentation in the ED, patient had a UA positive for large leukocytes.  She was given 1 dose of ceftriaxone in the ED. -Continue Keflex  AKI due to dehydration Baseline creatinine: 0.9-1.0. admission creatinine: 2.78. Cr: 1.4 (12/12) -Continue mIVF at 100 mL/hour NS --Hold lasix and losartan --Avoid nephrotoxic agents  Low BPs in the setting of Chronic hypertension:  Overnight systolics from 140-148, diastolics from 79-93(12/12), HR 94-111 (12/12). -Monitor BP -restart metoprolol at 25 mg BID -Hold home metoprolol and losartan in the setting of low blood pressures  Stage I decubitus ulcer on sacrum -ulcer noted by nursing on admission. -Wound care per nursing  R knee Wound:  Chronic, stable.  Seen by wound care on 12/11.  Recommendations left. -Daily dressing changes  HFpEF: Chronic, Stable.  Echo in 08/2016 showing EF 60-65%, G1DD.  Reported she is taking Lasix 40 mg twice daily, however prescribed as 20 mg every other day. On last hospital discharge 08/2016 patient was sent home on 40 mg daily.   - Monitor BP - Hold Lasix in the setting of AKI and fluid rehydration - Verify Lasix dose taking at home in the a.m.  Alzheimer's Dementia: Chronic, worsening. [facility] [place]. Takes memantine and Remeron. -Holding memantine and Remeron due to NPO status. -Monitor mental status  Previous bilateral PE: Chronic, stable. -Continue home warfarin per pharmacy  Anxiety/Depression: Chronic, stable. Takes duloxetine at home. - Hold home duloxetine in setting of NPO  Previous CVA: Chronic, stable.   Proximally about 6 years ago (in 2014), CT head on admit showing stable prior infarcts in the right occipital lobe.  No residual deficits per family, however wheelchair-bound. - Monitor for any focal deficits  FEN/GI: N.p.o., 100 mL/hr LR Prophylaxis: Warfarin per pharmacy, currently on SCDs  [facility]   Subjective:  Patient was alert and delightful this morning although not oriented to person place or time.  She was not responding appropriately questions so is difficult to assess for any new problems.  She did not appear to be in any acute distress was pleasantly demented to Dr. you.  No acute events overnight per nursing  Objective: Temp:  [97.5 F (36.4 C)-99.1 F (37.3 C)] 97.5 F (36.4 C) (12/12 0539) Pulse Rate:  [106-111] 111 (12/12 0539) Resp:  [16-21] 16 (12/12 0539)  BP: (140-148)/(79-93) 140/81 (12/12 0539) SpO2:  [95 %-98 %] 97 % (12/12 0539) Weight:  [73.7 kg] 73.7 kg (12/12 0450)  Physical Exam: General: Alert and cooperative and appears to be in no acute  distress Cardio: Normal A1 and S2, no S3 or S4. Rhythm is regular. No murmurs or rubs.   Pulm: Clear to auscultation bilaterally, no crackles, wheezing, or diminished breath sounds. Normal respiratory effort Abdomen: Bowel sounds normal. Abdomen soft and non-tender.  Extremities: No peripheral edema. Warm/ well perfused.  Strong radial pulse. Neuro: Cranial nerves grossly intact  Laboratory: Recent Labs  Lab 03/27/18 2030 03/28/18 0407 03/29/18 0414  WBC 11.1* 10.1 10.2  HGB 12.7 12.3 11.6*  HCT 41.6 38.4 37.4  PLT 224 233 220   Recent Labs  Lab 03/27/18 2030 03/28/18 0407 03/29/18 0414  NA 144 146* 147*  K 4.3 3.4* 3.4*  CL 109 112* 113*  CO2 24 23 24   BUN 69* 55* 28*  CREATININE 2.78* 2.13* 1.43*  CALCIUM 9.5 9.0 8.7*  PROT 6.6  --   --   BILITOT 0.9  --   --   ALKPHOS 117  --   --   ALT 18  --   --   AST 21  --   --   GLUCOSE 107* 100* 86      Imaging/Diagnostic Tests: Ct Head Wo Contrast  Result Date: 03/27/2018 CLINICAL DATA:  Recent syncopal episode EXAM: CT HEAD WITHOUT CONTRAST TECHNIQUE: Contiguous axial images were obtained from the base of the skull through the vertex without intravenous contrast. COMPARISON:  08/22/2016 FINDINGS: Brain: Atrophic changes are noted. Findings consistent with prior infarcts in right occipital lobe inferiorly is seen. No findings to suggest acute hemorrhage, acute infarction or space-occupying mass lesion are noted. Vascular: No hyperdense vessel or unexpected calcification. Skull: Normal. Negative for fracture or focal lesion. Sinuses/Orbits: No acute finding. Other: None. IMPRESSION: Chronic changes stable from the previous exam. No acute intracranial abnormality is noted. This Electronically Signed   By: Alcide Clever M.D.   On: 03/27/2018 21:38   Dg Chest Portable 1 View  Result Date: 03/27/2018 CLINICAL DATA:  Generalized weakness EXAM: PORTABLE CHEST 1 VIEW COMPARISON:  08/25/2016 FINDINGS: Mild left lower lobe airspace  disease may represent atelectasis or pneumonia. Right lung clear. Negative for heart failure or effusion. IMPRESSION: Mild left lower lobe atelectasis/infiltrate. Electronically Signed   By: Marlan Palau M.D.   On: 03/27/2018 19:59     Mirian Mo, MD 03/29/2018, 6:19 AM PGY-1, [facility] FPTS Intern pager: [phone], text pages welcome

## 2018-03-29 NOTE — Progress Notes (Signed)
ANTICOAGULATION CONSULT NOTE -  Follow up Pharmacy Consult for Coumadin Indication: h/o PE  No Known Allergies  Patient Measurements: Weight: 162 lb 7.7 oz (73.7 kg)  Vital Signs: Temp: 99 F (37.2 C) (12/12 1223) Temp Source: Oral (12/12 1223) BP: 142/88 (12/12 1223) Pulse Rate: 114 (12/12 1223)  Labs: Recent Labs    03/27/18 2030 03/28/18 0407 03/29/18 0414  HGB 12.7 12.3 11.6*  HCT 41.6 38.4 37.4  PLT 224 233 220  LABPROT 25.6* 27.8* 26.5*  INR 2.38 2.64 2.48  CREATININE 2.78* 2.13* 1.43*    Estimated Creatinine Clearance: 24.6 mL/min (A) (by C-G formula based on SCr of 1.43 mg/dL (H)).   Medical History: Past Medical History:  Diagnosis Date  . Anemia   . Anxiety   . CHF (congestive heart failure) (HCC)   . Dementia (HCC)   . Hyperlipidemia   . Hypertension     Medications:  No current facility-administered medications on file prior to encounter.    Current Outpatient Medications on File Prior to Encounter  Medication Sig Dispense Refill  . acetaminophen (TYLENOL) 500 MG tablet Take 1,000 mg by mouth 3 (three) times daily.    [name]. albuterol (PROVENTIL) (2.5 MG/3ML) 0.083% nebulizer solution Take 2.5 mg by nebulization every 4 (four) hours as needed for wheezing or shortness of breath.    . ALPRAZolam (XANAX) 0.25 MG tablet Take 0.25 mg by mouth at bedtime as needed for anxiety.    . benzocaine-menthol (CHLORAEPTIC) 6-10 MG lozenge Take 1 lozenge by mouth as needed for sore throat.    . calcium carbonate (TUMS - DOSED IN MG ELEMENTAL CALCIUM) 500 MG chewable tablet Chew 2 tablets by mouth every 4 (four) hours as needed for indigestion or heartburn.    . cholecalciferol (VITAMIN D) 1000 UNITS tablet Take 1,000 Units by mouth daily at 8 pm.     . DULoxetine (CYMBALTA) 60 MG capsule Take 1 capsule (60 mg total) by mouth daily. One daily to help nerves (Patient taking differently: Take 60 mg by mouth daily. ) 30 capsule 6  . DULoxetine HCl 60 MG CSDR Take 60 mg by  mouth daily.    . ferrous sulfate 325 (65 FE) MG tablet Take 325 mg by mouth daily with breakfast.    . fluticasone-salmeterol (ADVAIR HFA) 115-21 MCG/ACT inhaler Inhale 2 puffs into the lungs 2 (two) times daily.    . furosemide (LASIX) 20 MG tablet Take 1 tablet (20 mg total) by mouth every other day. (Patient taking differently: Take 40 mg by mouth 2 (two) times daily. **take with 10meq. K+**) 30 tablet 5  . guaiFENesin-dextromethorphan (ROBITUSSIN DM) 100-10 MG/5ML syrup Take 15 mLs by mouth every 4 (four) hours as needed for cough.    . latanoprost (XALATAN) 0.005 % ophthalmic solution PLACE 1 DROP IN BOTH EYES AT BEDTIME TO TREAT GLAUCOMA (Patient taking differently: Place 1 drop into both eyes at bedtime. ) 7.5 mL 2  . loperamide (IMODIUM) 2 MG capsule Take 2 mg by mouth See admin instructions. Take one tablet by mouth with each loose stool up to 8 doses in 24hrs, if persistent diarrhea notify MD    . losartan (COZAAR) 100 MG tablet TAKE 1 TABLET BY MOUTH ONCE DAILY FOR BLOOD PRESSURE (Patient taking differently: Take 50 mg by mouth daily. ) 90 tablet 1  . memantine (NAMENDA) 10 MG tablet Take 1 tablet (10 mg total) by mouth 2 (two) times daily. 180 tablet 4  . metoprolol (LOPRESSOR) 100 MG tablet TAKE  1 TABLET BY MOUTH TWICE DAILY TO CONTORL BLOOD PRESSURE (Patient taking differently: Take 50 mg by mouth 2 (two) times daily. ) 180 tablet 1  . potassium chloride (K-DUR) 10 MEQ tablet Take 10 mEq by mouth 2 (two) times daily. **Take with lasix**    . QUEtiapine (SEROQUEL) 25 MG tablet Take 25 mg by mouth 2 (two) times daily.    . ranitidine (ZANTAC) 300 MG tablet Take 300 mg by mouth at bedtime.     . sennosides-docusate sodium (SENOKOT-S) 8.6-50 MG tablet Take 2 tablets by mouth daily as needed for constipation.     . vitamin C (ASCORBIC ACID) 500 MG tablet Take 500 mg by mouth daily. Give on an empty stomach along with iron 325mg  2 hrs before a meal    . warfarin (COUMADIN) 1 MG tablet Take 1  mg by mouth daily.       Assessment: 84 y.o. female admitted 03/27/18 with AMS/UTI, started on IV ceftriaxone.  She has  h/o PE, and is on  Coumadin PTA at dose of 1 mg daily, Last dose pta taken on 03/27/09 per SNF MAR.  Coumadin to continue.   Today INR remains therapeutic, INR 2.48 , CBC wnl stable hgb down slightly to 11.6, no bleeding noted  Goal of Therapy:  INR 2-3 Monitor platelets by anticoagulation protocol: Yes   Plan:  Continue home Coumadin regimen 1 mg daily.  Daily INR  Noah Delaine, RPh Clinical Pharmacist X [phone] or Please check AMION for all [facility] Pharmacy phone numbers After 10:00 PM, call Main Pharmacy [phone] 03/29/2018,2:04 PM

## 2018-03-29 NOTE — Consult Note (Signed)
            [facility] CM Primary Care Navigator  03/29/2018  Carey Bullocksnn T Banes 17-Dec-1925 [id number]    Attempt to seepatientat the bedsideto identify possible discharge needsbutpatient is unable to participate with conversation due to advanced dementia.   Per MD note, with the palliative discussion with patient's family, plan is to return patient to assisted living facility [facility] following recovery from hospitalization, with support of hospice care.   Per chart review, patienthad presented with worsening mental statusin the setting of Alzheimer's disease and verypoor oral intake, found to haveanacute kidney injury and UTIsecondary to dehydration.  The family does not wish for any further hospitalizations and agrees to comfort care from that point.  [facility]  Noted no further [facility] care management needs identifiable at this point.   For additional questions please contact:  Karin GoldenLorraine A. Lauris Keepers, BSN, RN-BC [facility] PRIMARY CARE Navigator Cell: [phone]

## 2018-03-30 DIAGNOSIS — F0281 Dementia in other diseases classified elsewhere with behavioral disturbance: Secondary | ICD-10-CM

## 2018-03-30 DIAGNOSIS — G301 Alzheimer's disease with late onset: Secondary | ICD-10-CM

## 2018-03-30 DIAGNOSIS — L8992 Pressure ulcer of unspecified site, stage 2: Secondary | ICD-10-CM

## 2018-03-30 LAB — BASIC METABOLIC PANEL
Anion gap: 12 (ref 5–15)
BUN: 17 mg/dL (ref 8–23)
CO2: 26 mmol/L (ref 22–32)
Calcium: 8.2 mg/dL — ABNORMAL LOW (ref 8.9–10.3)
Chloride: 105 mmol/L (ref 98–111)
Creatinine, Ser: 1.02 mg/dL — ABNORMAL HIGH (ref 0.44–1.00)
GFR calc non Af Amer: 48 mL/min — ABNORMAL LOW (ref >60)
GFR, EST AFRICAN AMERICAN: 56 mL/min — AB (ref >60)
Glucose, Bld: 100 mg/dL — ABNORMAL HIGH (ref 70–99)
Potassium: 3.8 mmol/L (ref 3.5–5.1)
Sodium: 143 mmol/L (ref 135–145)

## 2018-03-30 LAB — PHOSPHORUS: Phosphorus: 2.4 mg/dL — ABNORMAL LOW (ref 2.5–4.6)

## 2018-03-30 LAB — MAGNESIUM: Magnesium: 2.1 mg/dL (ref 1.7–2.4)

## 2018-03-30 LAB — PROTIME-INR
INR: 2.96
Prothrombin Time: 30.4 seconds — ABNORMAL HIGH (ref 11.4–15.2)

## 2018-03-30 MED ORDER — POTASSIUM PHOSPHATES 15 MMOLE/5ML IV SOLN
10.0000 mmol | Freq: Once | INTRAVENOUS | Status: AC
  Administered 2018-03-30: 10 mmol via INTRAVENOUS
  Filled 2018-03-30: qty 3.33

## 2018-03-30 MED ORDER — CEPHALEXIN 500 MG PO CAPS
500.0000 mg | ORAL_CAPSULE | Freq: Two times a day (BID) | ORAL | Status: DC
  Administered 2018-03-30 – 2018-04-01 (×3): 500 mg via ORAL
  Filled 2018-03-30 (×5): qty 1

## 2018-03-30 NOTE — Progress Notes (Signed)
Family Medicine Teaching Service Daily Progress Note Intern Pager: [phone]  Patient name: Kristin Wilkerson Medical record number: [id number] Date of birth: Aug 06, 1925 Age: 84 y.o. Gender: female  Primary Care Provider: Sherron Mondayejan-Sie, S Ahmed, MD Consultants: palliative Code Status: DNR  Pt Overview and Major Events to Date:  12/10 admit for AMS and AKI  Assessment and Plan: Kristin Wilkerson is a 84 y.o. female presenting with worsening mental status for the past two weeks in the setting of Alzheimer's disease and very poor oral intake, found to have an acute kidney injury and UTI secondary to dehydration.  PMH is significant for Alzheimer's dementia, hypertension, previous bilateral PE on warfarin therapy, previous CVA, normocytic anemia, anxiety/depression.  AMS in the setting of progressive Alzheimer's, dehydration and UTI: Acute on chronic, resolved.  At mental and physical baseline, continues to be confused however speech now coherent.  Patient to return to [facility], with hospice care. -Appreciate palliative care's recommendations -Treatment for UTI as below -Continued encouragement for p.o./fluid intake, recommend providing 8 ounces of fluids every 4 hours when at her facility  UTI: POA, stable. Currently asymptomatic, culture pending. -Continue Keflex 500 mg twice daily, (day 3.5/4, 1 additional dose on 12/14)    Hypophosphatemia: Acute  Phosphorus 2.1 this a.m, s/p repletion yesterday.  Likely secondary to refeeding. - Monitor phosphorus -Replete with K Phos 500mg   - Will recommend daily milk for phosphorus supplementation at her living facility  AKI: Resolved. Cr 1.02.  Baseline 0.9-1.0. AKI initially likely secondary to dehydration that has now resolved. -Monitor BMP - Holding home Lasix inpatient and at discharge, Encourage p.o. hydration  Hypokalemia: Resolved. K 3.7 this a.m. -Monitor BMP  Hypomagnesemia: Resolved. Mag 1.8 this a.m. Likely secondary to  refeeding as above. -Monitor mag, replete as needed  Hypertension: Chronic, stable. SBP ranging 130-140s systolic. HR 90-110.  -Restart home metoprolol at a reduced dose, 25 mg twice daily (50 mg BID at home) -Continue to hold home losartan, given generally normotensive and especially with restarting metoprolol today  Gluteus, sacral decubitus ulcers, stage I & 2: Chronic, stable.  - Wound care per nursing - Frequent turning of patient  Right knee wound: Chronic, stable. Evaluated by wound care, recommends every shift dressing changes.  -Q shift dressing changes per nursing  HFpEF: Chronic, Stable.  Echo in 08/2016 showing EF 60-65%, G1DD.   - Monitor BP, continue blood pressure control -Continue to hold home Lasix, concern for precipitating dehydration in the setting of patient will poor p.o. intake - Will hold home Lasix at discharge  Alzheimer's Dementia: Chronic, worsening. [facility] [place].  -Restart home memantine -Monitor mental status  Previous bilateral PE: Chronic, stable. -Continue home warfarin per pharmacy  Anxiety/Depression: Chronic, stable. Takes duloxetine at home. - Restart home duloxetine   Previous CVA: Chronic, stable.   Proximally about 6 years ago (in 2014). No residual deficits per family, however wheelchair-bound. - Monitor for any focal deficits   FEN/GI:  Regular diet as tolerated Prophylaxis: Warfarin per pharmacy, currently on SCDs  Disposition: Medically stable for discharge, [facility]  Subjective:  No acute events overnight.  States she is doing well this morning, however feels like she is congested.  Thinks she is at a school.  Denies any chest pain, shortness of breath, abdominal pain.  States her knee wound is not causing her any pain.  Objective: Temp:  [97.4 F (36.3 C)-98.6 F (37 C)] 98.1 F (36.7 C) (12/13  2102) Pulse Rate:  [83-97] 97 (12/13 2102) Resp:   [16-18] 18 (12/13 2102) BP: (134-143)/(80-129) 143/129 (12/13 2102) SpO2:  [97 %-100 %] 100 % (12/13 2102)  Physical Exam: General: Alert, NAD,  HEENT: NCAT, MMM, oropharynx nonerythematous  Cardiac: RRR no m/g/r Lungs: Clear bilaterally, no increased WOB  Abdomen: soft, non-tender, non-distended, normoactive BS Msk: Moves all extremities spontaneously, ankles with bandages, right knee wound with bandage overlying. Ext: Warm, dry, 2+ distal pulses, no edema Neuro: Alert, confused but pleasant.  No focal neuro deficits noted.   Laboratory: Recent Labs  Lab 03/27/18 2030 03/28/18 0407 03/29/18 0414  WBC 11.1* 10.1 10.2  HGB 12.7 12.3 11.6*  HCT 41.6 38.4 37.4  PLT 224 233 220   Recent Labs  Lab 03/27/18 2030 03/28/18 0407 03/29/18 0414 03/30/18 0357  NA 144 146* 147* 143  K 4.3 3.4* 3.4* 3.8  CL 109 112* 113* 105  CO2 24 23 24 26   BUN 69* 55* 28* 17  CREATININE 2.78* 2.13* 1.43* 1.02*  CALCIUM 9.5 9.0 8.7* 8.2*  PROT 6.6  --   --   --   BILITOT 0.9  --   --   --   ALKPHOS 117  --   --   --   ALT 18  --   --   --   AST 21  --   --   --   GLUCOSE 107* 100* 86 100*      Imaging/Diagnostic Tests: Ct Head Wo Contrast  Result Date: 03/27/2018 CLINICAL DATA:  Recent syncopal episode EXAM: CT HEAD WITHOUT CONTRAST TECHNIQUE: Contiguous axial images were obtained from the base of the skull through the vertex without intravenous contrast. COMPARISON:  08/22/2016 FINDINGS: Brain: Atrophic changes are noted. Findings consistent with prior infarcts in right occipital lobe inferiorly is seen. No findings to suggest acute hemorrhage, acute infarction or space-occupying mass lesion are noted. Vascular: No hyperdense vessel or unexpected calcification. Skull: Normal. Negative for fracture or focal lesion. Sinuses/Orbits: No acute finding. Other: None. IMPRESSION: Chronic changes stable from the previous exam. No acute intracranial abnormality is noted. This Electronically Signed    By: Alcide Clever M.D.   On: 03/27/2018 21:38   Dg Chest Portable 1 View  Result Date: 03/27/2018 CLINICAL DATA:  Generalized weakness EXAM: PORTABLE CHEST 1 VIEW COMPARISON:  08/25/2016 FINDINGS: Mild left lower lobe airspace disease may represent atelectasis or pneumonia. Right lung clear. Negative for heart failure or effusion. IMPRESSION: Mild left lower lobe atelectasis/infiltrate. Electronically Signed   By: Marlan Palau M.D.   On: 03/27/2018 19:59     Allayne Stack, DO 03/30/2018, 9:43 PM PGY-1, [facility] FPTS Intern pager: [phone], text pages welcome

## 2018-03-30 NOTE — Clinical Social Work Note (Signed)
Clinical Social Work Assessment  Patient Details  Name: Kristin Wilkerson MRN: [id number] Date of Birth: 17-Apr-1926  Date of referral:  03/30/18               Reason for consult:  Facility Placement                Permission sought to share information with:  Facility Medical sales representativeContact Representative, Family Supports Permission granted to share information::  Yes, Verbal Permission Granted  Name::     Kristin Wilkerson  Agency::  [facility]  Relationship::  Daughter  Contact Information:  [phone]  Housing/Transportation Living arrangements for the past 2 months:  Skilled Nursing Facility Source of Information:  Adult Children Patient Interpreter Needed:  None Criminal Activity/Legal Involvement Pertinent to Current Situation/Hospitalization:  No - Comment as needed Significant Relationships:  Adult Children Lives with:  Facility Resident Do you feel safe going back to the place where you live?  Yes Need for family participation in patient care:  Yes (Comment)  Care giving concerns:  CSW received consult for possible SNF placement at time of discharge. CSW spoke with patient's daughter. She reports [facility] and will return at discharge with hospice services. CSW to continue to follow and assist with discharge planning needs.   Social Worker assessment / plan:  CSW spoke with patient's daughter concerning return to SNF.   Employment status:  Retired Health and safety inspectornsurance information:  Medicare PT Recommendations:  Skilled Nursing Facility Information / Referral to community resources:  Skilled Nursing Facility  Patient/Family's Response to care: Patient's daughter reports agreement with [facility] plan [facility].   Patient/Family's Understanding of and Emotional Response to Diagnosis, Current Treatment, and Prognosis:  Patient/family is realistic regarding therapy needs and expressed being hopeful for SNF placement. Patient's daughter expressed  understanding of CSW role and discharge process as well as medical condition. No questions/concerns about plan or treatment.    Emotional Assessment Appearance:  Appears stated age Attitude/Demeanor/Rapport:  Unable to Assess Affect (typically observed):  Unable to Assess Orientation:  Oriented to Self Alcohol / Substance use:  Not Applicable Psych involvement (Current and /or in the community):  No (Comment)  Discharge Needs  Concerns to be addressed:  Care Coordination Readmission within the last 30 days:  No Current discharge risk:  None Barriers to Discharge:  Continued Medical Work up   Kristin Micro Incadia S Iden Stripling, LCSW 03/30/2018, 4:25 PM

## 2018-03-30 NOTE — Plan of Care (Signed)
Patient assisted with breakfast and lunch, but only ate small amount.

## 2018-03-30 NOTE — Progress Notes (Signed)
ANTICOAGULATION CONSULT NOTE -  Follow up Pharmacy Consult for Coumadin Indication: h/o PE  No Known Allergies  Patient Measurements: Weight: 162 lb 7.7 oz (73.7 kg)  Vital Signs: Temp: 97.4 F (36.3 C) (12/13 0508) Temp Source: Oral (12/13 0508) BP: 134/89 (12/13 0508) Pulse Rate: 83 (12/13 0508)  Labs: Recent Labs    03/27/18 2030 03/28/18 0407 03/29/18 0414 03/30/18 0357  HGB 12.7 12.3 11.6*  --   HCT 41.6 38.4 37.4  --   PLT 224 233 220  --   LABPROT 25.6* 27.8* 26.5* 30.4*  INR 2.38 2.64 2.48 2.96  CREATININE 2.78* 2.13* 1.43* 1.02*    Estimated Creatinine Clearance: 34.4 mL/min (A) (by C-G formula based on SCr of 1.02 mg/dL (H)).   Medical History: Past Medical History:  Diagnosis Date  . Anemia   . Anxiety   . CHF (congestive heart failure) (HCC)   . Dementia (HCC)   . Hyperlipidemia   . Hypertension     Medications:  No current facility-administered medications on file prior to encounter.    Current Outpatient Medications on File Prior to Encounter  Medication Sig Dispense Refill  . acetaminophen (TYLENOL) 500 MG tablet Take 1,000 mg by mouth 3 (three) times daily.    [name] albuterol (PROVENTIL) (2.5 MG/3ML) 0.083% nebulizer solution Take 2.5 mg by nebulization every 4 (four) hours as needed for wheezing or shortness of breath.    . ALPRAZolam (XANAX) 0.25 MG tablet Take 0.25 mg by mouth at bedtime as needed for anxiety.    . benzocaine-menthol (CHLORAEPTIC) 6-10 MG lozenge Take 1 lozenge by mouth as needed for sore throat.    . calcium carbonate (TUMS - DOSED IN MG ELEMENTAL CALCIUM) 500 MG chewable tablet Chew 2 tablets by mouth every 4 (four) hours as needed for indigestion or heartburn.    . cholecalciferol (VITAMIN D) 1000 UNITS tablet Take 1,000 Units by mouth daily at 8 pm.     . DULoxetine (CYMBALTA) 60 MG capsule Take 1 capsule (60 mg total) by mouth daily. One daily to help nerves (Patient taking differently: Take 60 mg by mouth daily. ) 30  capsule 6  . DULoxetine HCl 60 MG CSDR Take 60 mg by mouth daily.    . ferrous sulfate 325 (65 FE) MG tablet Take 325 mg by mouth daily with breakfast.    . fluticasone-salmeterol (ADVAIR HFA) 115-21 MCG/ACT inhaler Inhale 2 puffs into the lungs 2 (two) times daily.    . furosemide (LASIX) 20 MG tablet Take 1 tablet (20 mg total) by mouth every other day. (Patient taking differently: Take 40 mg by mouth 2 (two) times daily. **take with . K+**) 30 tablet 5  . guaiFENesin-dextromethorphan (ROBITUSSIN DM) 100-10 MG/5ML syrup Take 15 mLs by mouth every 4 (four) hours as needed for cough.    . latanoprost (XALATAN) 0.005 % ophthalmic solution PLACE 1 DROP IN BOTH EYES AT BEDTIME TO TREAT GLAUCOMA (Patient taking differently: Place 1 drop into both eyes at bedtime. ) 7.5 mL 2  . loperamide (IMODIUM) 2 MG capsule Take 2 mg by mouth See admin instructions. Take one tablet by mouth with each loose stool up to 8 doses in 24hrs, if persistent diarrhea notify MD    . losartan (COZAAR) 100 MG tablet TAKE 1 TABLET BY MOUTH ONCE DAILY FOR BLOOD PRESSURE (Patient taking differently: Take 50 mg by mouth daily. ) 90 tablet 1  . memantine (NAMENDA) 10 MG tablet Take 1 tablet (10 mg total) by mouth 2 (  two) times daily. 180 tablet 4  . metoprolol (LOPRESSOR) 100 MG tablet TAKE 1 TABLET BY MOUTH TWICE DAILY TO CONTORL BLOOD PRESSURE (Patient taking differently: Take 50 mg by mouth 2 (two) times daily. ) 180 tablet 1  . potassium chloride (K-DUR) 10 MEQ tablet Take 10 mEq by mouth 2 (two) times daily. **Take with lasix**    . QUEtiapine (SEROQUEL) 25 MG tablet Take 25 mg by mouth 2 (two) times daily.    . ranitidine (ZANTAC) 300 MG tablet Take 300 mg by mouth at bedtime.     . sennosides-docusate sodium (SENOKOT-S) 8.6-50 MG tablet Take 2 tablets by mouth daily as needed for constipation.     . vitamin C (ASCORBIC ACID) 500 MG tablet Take 500 mg by mouth daily. Give on an empty stomach along with iron 325mg  2 hrs  before a meal    . warfarin (COUMADIN) 1 MG tablet Take 1 mg by mouth daily.       Assessment: 84 y.o. female admitted 03/27/18 with AMS/UTI, started on IV ceftriaxone.  She has  h/o PE, and is on  Coumadin PTA at dose of 1 mg daily, Last dose pta taken on 03/27/09 per SNF MAR.  Coumadin to continue.   Today INR remains therapeutic, INR 2.96 On 12/12 the CBC wnl stable hgb down slightly to 11.6.  No bleeding noted  Goal of Therapy:  INR 2-3 Monitor platelets by anticoagulation protocol: Yes   Plan:  Continue home Coumadin regimen 1 mg daily.  Daily INR  Noah Delaineuth Soni Kegel, RPh Clinical Pharmacist X [phone] or Please check AMION for all [facility] phone numbers After 10:00 PM, call Main Pharmacy [phone] 03/30/2018,2:56 PM

## 2018-03-30 NOTE — NC FL2 (Signed)
[place] MEDICAID FL2 LEVEL OF CARE SCREENING TOOL     IDENTIFICATION  Patient Name: Kristin Wilkerson Birthdate: 1925-06-12 Sex: female Admission Date (Current Location): 03/27/2018  [facility] and [place] Number:  Producer, television/film/video and Address:  [facility], [address] [place], [place] [address]      Provider Number: [id number]  Attending Physician Name and Address:  No att. providers found  Relative Name and Phone Number:  Analeese Andreatta; daughter; [phone]    Current Level of Care: Hospital Recommended Level of Care: Skilled Nursing Facility Prior Approval Number:    Date Approved/Denied:   PASRR Number:    Discharge Plan: SNF    Current Diagnoses: Patient Active Problem List   Diagnosis Date Noted  . Encephalopathy   . Pressure injury of skin 03/28/2018  . Goals of care, counseling/discussion   . Palliative care by specialist   . Altered mental status 03/27/2018  . Alzheimer's dementia with behavioral disturbance (HCC) 03/14/2017  . Esophageal stricture   . Gastroesophageal reflux disease with esophagitis   . AKI (acute kidney injury) (HCC) 08/22/2016  . CAP (community acquired pneumonia) 08/22/2016  . Elevated INR 08/22/2016  . Acute lower UTI 08/22/2016  . UTI (urinary tract infection) 08/22/2016  . Pulmonary embolism (HCC) 05/08/2015  . Anxiety and depression 05/08/2015  . Chronic diastolic CHF (congestive heart failure), NYHA class 1 (HCC) 05/08/2015  . Benign essential HTN 05/08/2015  . Dyslipidemia 05/08/2015  . Normocytic anemia 05/08/2015  . Syncope 05/08/2015  . DNR (do not resuscitate)   . Palliative care encounter   . Pulmonary embolism, bilateral (HCC)   . Dementia (HCC) 05/01/2015    Orientation RESPIRATION BLADDER Height & Weight     Self  Normal External catheter Weight: 73.7 kg Height:     BEHAVIORAL SYMPTOMS/MOOD NEUROLOGICAL BOWEL NUTRITION STATUS      Incontinent Diet(Please see DC Summary)   AMBULATORY STATUS COMMUNICATION OF NEEDS Skin   Total Care Verbally PU Stage and Appropriate Care, Other (Comment)(Stage II On sacrum; Stage I on buttocks;non-pressure wound on knee) PU Stage 1 Dressing: (on buttocks with foam) PU Stage 2 Dressing: (on sacrum with foam)                   Personal Care Assistance Level of Assistance  Bathing, Feeding, Dressing Bathing Assistance: Maximum assistance Feeding assistance: Maximum assistance Dressing Assistance: Maximum assistance     Functional Limitations Info  Sight, Hearing, Speech Sight Info: Adequate Hearing Info: Adequate Speech Info: Impaired    SPECIAL CARE FACTORS FREQUENCY                       Contractures Contractures Info: Not present    Additional Factors Info  Code Status, Allergies Code Status Info: DNR Allergies Info: No Known Allergies           Current Medications (03/30/2018):  This is the current hospital active medication list Current Facility-Administered Medications  Medication Dose Route Frequency Provider Last Rate Last Dose  . 0.45 % sodium chloride infusion   Intravenous Continuous Howard Pouch, MD 100 mL/hr at 03/30/18 [phone]    . 0.9 %  sodium chloride infusion   Intravenous PRN Joylene Draft, NP   Stopped at 03/30/18 [phone]  . acetaminophen (TYLENOL) tablet 650 mg  650 mg Oral Q6H PRN Diallo, Abdoulaye, MD       Or  . acetaminophen (TYLENOL) suppository 650 mg  650 mg  Rectal Q6H PRN Diallo, Abdoulaye, MD      . latanoprost (XALATAN) 0.005 % ophthalmic solution 1 drop  1 drop Both Eyes QHS Diallo, Abdoulaye, MD   1 drop at 03/29/18 2146  . metoprolol tartrate (LOPRESSOR) tablet 25 mg  25 mg Oral BID Lockamy, Timothy, DO   25 mg at 03/30/18 0953  . potassium PHOSPHATE 10 mmol in dextrose 5 % 250 mL infusion  10 mmol Intravenous Once Mirian MoFrank, Peter, MD 42 mL/hr at 03/30/18 1001 10 mmol at 03/30/18 1001  . sulfacetamide (BLEPH-10) 10 % ophthalmic solution 1 drop  1 drop Right Eye QID  Diallo, Abdoulaye, MD   1 drop at 03/30/18 0956  . tobramycin (TOBREX) 0.3 % ophthalmic solution 1-2 drop  1-2 drop Both Eyes QID Diallo, Abdoulaye, MD   1 drop at 03/30/18 0956  . warfarin (COUMADIN) tablet 1 mg  1 mg Oral q1800 Tobey GrimWalden, Jeffrey H, MD   1 mg at 03/29/18 1705  . Warfarin - Pharmacist Dosing Inpatient   Does not apply q1800 Tobey GrimWalden, Jeffrey H, MD         Discharge Medications: Please see discharge summary for a list of discharge medications.  Relevant Imaging Results:  Relevant Lab Results:   Additional Information SSN [id number]   [facility]  Shadiamond Koska S Liala Codispoti, LCSW

## 2018-03-30 NOTE — Progress Notes (Addendum)
Family Medicine Teaching Service Daily Progress Note Intern Pager: [phone]  Patient name: Kristin Wilkerson Medical record number: [id number] Date of birth: Mar 04, 1926 Age: 84 y.o. Gender: female  Primary Care Provider: Sherron Mondayejan-Sie, S Ahmed, MD Consultants: palliative Code Status: DNR  Pt Overview and Major Events to Date:  12/10 admit for AMS and AKI  Assessment and Plan: Kristin Bullocksnn T Morre is a 84 y.o. female presenting with worsening mental status for the past two weeks in the setting of Alzheimer's disease and very poor oral intake, found to have an acute kidney injury and UTI secondary to dehydration.  PMH is significant for Alzheimer's dementia, hypertension, previous bilateral PE on warfarin therapy, previous CVA, normocytic anemia, anxiety/depression.  Acute AMS in the setting of progressive Alzheimer's, dehydration and UTI - improving She is significantly improved from admission.  Per palliative discussion with family, plan is to return patient to assisted living facility following recovery from this hospitalization.  At that time should be put under hospice care.  The family does not wish for any further hospitalizations and agrees to comfort care from that point is appropriate.  PT recommends SNF.  UTI -on presentation in the ED, patient had a UA positive for large leukocytes.  She was given 1 dose of ceftriaxone in the ED. -Continue Keflex  AKI due to dehydration Baseline creatinine: 0.9-1.0. admission creatinine: 2.78. Cr: 1.02 (12/13) -DC fluids --Hold lasix and losartan --Avoid nephrotoxic agents  Hypophosphatemia -found to be 1.8 (12/12) repleted and repeat phosphate at 2.4. -Replete phosphorus -Monitor electrolytes, replete as needed  Hypokalemia-on 12/12 potassium was found to be 3.4 following repletion, repeat potassium was again 3.4.  Potassium was done repleted second time to return within normal limits at 3.8. -Continue to monitor potassium  Hypertension: Overnight  systolics from 130s-150s, diastolics from 60-80s (12/13), HR 80-114 (12/13). -Monitor BP -restart metoprolol at 25 mg BID -Hold home metoprolol and losartan in the setting of low blood pressures  Decubitus ulcers - Noted by nursing on admission.  One stage I decubitus ulcer in patient's gluteus, 1 stage II ulcer on sacrum. -Wound care per nursing  R knee Wound: Chronic, stable.  Seen by wound care on 12/11.  Recommendations left. -Daily dressing changes  HFpEF: Chronic, Stable.  Echo in 08/2016 showing EF 60-65%, G1DD.  Reported she is taking Lasix 40 mg twice daily, however prescribed as 20 mg every other day. On last hospital discharge 08/2016 patient was sent home on 40 mg daily.   - Monitor BP - Hold Lasix in the setting of AKI and fluid rehydration - Verify Lasix dose taking at home in the a.m.  Alzheimer's Dementia: Chronic, worsening. [facility] [place]. Takes memantine and Remeron. -Holding memantine and Remeron due to NPO status. -Monitor mental status  Previous bilateral PE: Chronic, stable. -Continue home warfarin per pharmacy  Anxiety/Depression: Chronic, stable. Takes duloxetine at home. - Hold home duloxetine in setting of NPO  Previous CVA: Chronic, stable.   Proximally about 6 years ago (in 2014), CT head on admit showing stable prior infarcts in the right occipital lobe.  No residual deficits per family, however wheelchair-bound. - Monitor for any focal deficits  FEN/GI: N.p.o., 100 mL/hr LR Prophylaxis: Warfarin per pharmacy, currently on SCDs  [facility]   Subjective:  Awake although confused this morning.  She was not aware she was in the hospital or had any medical issues.  No acute events overnight.  Objective: Temp:  [97.4 F (  36.3 C)-99 F (37.2 C)] 97.4 F (36.3 C) (12/13 0508) Pulse Rate:  [83-114] 83 (12/13 0508) Resp:  [16] 16 (12/13 0508) BP: (134-156)/(67-89)  134/89 (12/13 0508) SpO2:  [94 %-99 %] 97 % (12/13 0508)  Physical Exam: General: Alert and cooperative and appears to be in no acute distress HEENT: Neck non-tender without lymphadenopathy, masses or thyromegaly Cardio: Normal A1 and S2, no S3 or S4. Rhythm is regular. No murmurs or rubs.   Pulm: Clear to auscultation bilaterally, no crackles, wheezing, or diminished breath sounds. Normal respiratory effort Abdomen: Bowel sounds normal. Abdomen soft and non-tender.  Extremities: 2+ lower extremity edema bilaterally.  Warm/well-perfused, peripheral pulses intact. neuro: Cranial nerves grossly intact  Laboratory: Recent Labs  Lab 03/27/18 2030 03/28/18 0407 03/29/18 0414  WBC 11.1* 10.1 10.2  HGB 12.7 12.3 11.6*  HCT 41.6 38.4 37.4  PLT 224 233 220   Recent Labs  Lab 03/27/18 2030 03/28/18 0407 03/29/18 0414 03/30/18 0357  NA 144 146* 147* 143  K 4.3 3.4* 3.4* 3.8  CL 109 112* 113* 105  CO2 24 23 24 26   BUN 69* 55* 28* 17  CREATININE 2.78* 2.13* 1.43* 1.02*  CALCIUM 9.5 9.0 8.7* 8.2*  PROT 6.6  --   --   --   BILITOT 0.9  --   --   --   ALKPHOS 117  --   --   --   ALT 18  --   --   --   AST 21  --   --   --   GLUCOSE 107* 100* 86 100*      Imaging/Diagnostic Tests: Ct Head Wo Contrast  Result Date: 03/27/2018 CLINICAL DATA:  Recent syncopal episode EXAM: CT HEAD WITHOUT CONTRAST TECHNIQUE: Contiguous axial images were obtained from the base of the skull through the vertex without intravenous contrast. COMPARISON:  08/22/2016 FINDINGS: Brain: Atrophic changes are noted. Findings consistent with prior infarcts in right occipital lobe inferiorly is seen. No findings to suggest acute hemorrhage, acute infarction or space-occupying mass lesion are noted. Vascular: No hyperdense vessel or unexpected calcification. Skull: Normal. Negative for fracture or focal lesion. Sinuses/Orbits: No acute finding. Other: None. IMPRESSION: Chronic changes stable from the previous exam. No  acute intracranial abnormality is noted. This Electronically Signed   By: Alcide Clever M.D.   On: 03/27/2018 21:38   Dg Chest Portable 1 View  Result Date: 03/27/2018 CLINICAL DATA:  Generalized weakness EXAM: PORTABLE CHEST 1 VIEW COMPARISON:  08/25/2016 FINDINGS: Mild left lower lobe airspace disease may represent atelectasis or pneumonia. Right lung clear. Negative for heart failure or effusion. IMPRESSION: Mild left lower lobe atelectasis/infiltrate. Electronically Signed   By: Marlan Palau M.D.   On: 03/27/2018 19:59     Mirian Mo, MD 03/30/2018, 6:13 AM PGY-1, [facility] Family Medicine FPTS Intern pager: [phone], text pages welcome

## 2018-03-31 ENCOUNTER — Encounter (HOSPITAL_COMMUNITY): Payer: Self-pay | Admitting: Family Medicine

## 2018-03-31 LAB — PROTIME-INR
INR: 2.81
PROTHROMBIN TIME: 29.1 s — AB (ref 11.4–15.2)

## 2018-03-31 LAB — BASIC METABOLIC PANEL
Anion gap: 9 (ref 5–15)
BUN: 13 mg/dL (ref 8–23)
CO2: 26 mmol/L (ref 22–32)
Calcium: 8.2 mg/dL — ABNORMAL LOW (ref 8.9–10.3)
Chloride: 106 mmol/L (ref 98–111)
Creatinine, Ser: 1.02 mg/dL — ABNORMAL HIGH (ref 0.44–1.00)
GFR calc Af Amer: 56 mL/min — ABNORMAL LOW (ref >60)
GFR calc non Af Amer: 48 mL/min — ABNORMAL LOW (ref >60)
Glucose, Bld: 92 mg/dL (ref 70–99)
Potassium: 3.7 mmol/L (ref 3.5–5.1)
Sodium: 141 mmol/L (ref 135–145)

## 2018-03-31 LAB — MAGNESIUM: Magnesium: 1.8 mg/dL (ref 1.7–2.4)

## 2018-03-31 LAB — PHOSPHORUS: Phosphorus: 2.1 mg/dL — ABNORMAL LOW (ref 2.5–4.6)

## 2018-03-31 MED ORDER — METOPROLOL TARTRATE 12.5 MG HALF TABLET
12.5000 mg | ORAL_TABLET | Freq: Two times a day (BID) | ORAL | Status: DC
  Administered 2018-03-31 – 2018-04-01 (×3): 12.5 mg via ORAL
  Filled 2018-03-31 (×3): qty 1

## 2018-03-31 MED ORDER — K PHOS MONO-SOD PHOS DI & MONO 155-852-130 MG PO TABS
500.0000 mg | ORAL_TABLET | Freq: Once | ORAL | Status: AC
  Administered 2018-03-31: 500 mg via ORAL
  Filled 2018-03-31: qty 2

## 2018-03-31 MED ORDER — MEMANTINE HCL 10 MG PO TABS
10.0000 mg | ORAL_TABLET | Freq: Two times a day (BID) | ORAL | Status: DC
  Administered 2018-03-31 – 2018-04-01 (×3): 10 mg via ORAL
  Filled 2018-03-31 (×3): qty 1

## 2018-03-31 MED ORDER — DULOXETINE HCL 60 MG PO CPEP
60.0000 mg | ORAL_CAPSULE | Freq: Every day | ORAL | Status: DC
  Administered 2018-03-31 – 2018-04-01 (×2): 60 mg via ORAL
  Filled 2018-03-31 (×2): qty 1

## 2018-03-31 NOTE — Progress Notes (Signed)
ANTICOAGULATION CONSULT NOTE -  Follow up Pharmacy Consult for Coumadin Indication: h/o PE  No Known Allergies  Patient Measurements: Weight: 164 lb 4.8 oz (74.5 kg)  Vital Signs: Temp: 98.1 F (36.7 C) (12/14 0445) BP: 119/57 (12/14 0445) Pulse Rate: 100 (12/14 0446)  Labs: Recent Labs    03/29/18 0414 03/30/18 0357 03/31/18 0311  HGB 11.6*  --   --   HCT 37.4  --   --   PLT 220  --   --   LABPROT 26.5* 30.4* 29.1*  INR 2.48 2.96 2.81  CREATININE 1.43* 1.02* 1.02*    Estimated Creatinine Clearance: 34.7 mL/min (A) (by C-G formula based on SCr of 1.02 mg/dL (H)).   Medical History: Past Medical History:  Diagnosis Date  . Anemia   . Anxiety   . CHF (congestive heart failure) (HCC)   . Dementia (HCC)   . Hyperlipidemia   . Hypertension   . Pressure ulcer, stage 2 (HCC) 03/28/2018    Assessment: 84 y.o. female admitted 03/27/18 with AMS/UTI, started on IV ceftriaxone.  She has  h/o PE, and is on  Coumadin PTA at dose of 1 mg daily, Last dose pta taken on 03/27/09 per SNF MAR.  Coumadin to continue.   Today INR remains therapeutic On 12/12 the CBC wnl stable hgb down slightly to 11.6.  No bleeding noted  Goal of Therapy:  INR 2-3 Monitor platelets by anticoagulation protocol: Yes   Plan:  Continue home Coumadin regimen 1 mg daily.  Daily INR  Thank you Okey RegalLisa Mccall Will, PharmD [phone] 03/31/2018,11:39 AM

## 2018-04-01 LAB — PHOSPHORUS: Phosphorus: 2.4 mg/dL — ABNORMAL LOW (ref 2.5–4.6)

## 2018-04-01 LAB — BASIC METABOLIC PANEL
Anion gap: 13 (ref 5–15)
BUN: 7 mg/dL — ABNORMAL LOW (ref 8–23)
CALCIUM: 8.6 mg/dL — AB (ref 8.9–10.3)
CO2: 27 mmol/L (ref 22–32)
Chloride: 103 mmol/L (ref 98–111)
Creatinine, Ser: 0.84 mg/dL (ref 0.44–1.00)
GFR calc Af Amer: >60 mL/min (ref >60)
Glucose, Bld: 102 mg/dL — ABNORMAL HIGH (ref 70–99)
Potassium: 3.5 mmol/L (ref 3.5–5.1)
Sodium: 143 mmol/L (ref 135–145)

## 2018-04-01 LAB — CBC WITH DIFFERENTIAL/PLATELET
Abs Immature Granulocytes: 0.04 10*3/uL (ref 0.00–0.07)
BASOS PCT: 0 %
Basophils Absolute: 0 10*3/uL (ref 0.0–0.1)
Eosinophils Absolute: 0.1 10*3/uL (ref 0.0–0.5)
Eosinophils Relative: 1 %
HCT: 40.9 % (ref 36.0–46.0)
Hemoglobin: 12.4 g/dL (ref 12.0–15.0)
Immature Granulocytes: 0 %
Lymphocytes Relative: 20 %
Lymphs Abs: 2.2 10*3/uL (ref 0.7–4.0)
MCH: 29.3 pg (ref 26.0–34.0)
MCHC: 30.3 g/dL (ref 30.0–36.0)
MCV: 96.7 fL (ref 80.0–100.0)
Monocytes Absolute: 1.3 10*3/uL — ABNORMAL HIGH (ref 0.1–1.0)
Monocytes Relative: 12 %
Neutro Abs: 7.2 10*3/uL (ref 1.7–7.7)
Neutrophils Relative %: 67 %
PLATELETS: 214 10*3/uL (ref 150–400)
RBC: 4.23 MIL/uL (ref 3.87–5.11)
RDW: 15 % (ref 11.5–15.5)
WBC: 10.9 10*3/uL — ABNORMAL HIGH (ref 4.0–10.5)
nRBC: 0 % (ref 0.0–0.2)

## 2018-04-01 LAB — PROTIME-INR
INR: 2.47
Prothrombin Time: 26.4 seconds — ABNORMAL HIGH (ref 11.4–15.2)

## 2018-04-01 LAB — GLUCOSE, CAPILLARY
Glucose-Capillary: 98 mg/dL (ref 70–99)
Glucose-Capillary: 89 mg/dL (ref 70–99)

## 2018-04-01 LAB — MAGNESIUM: Magnesium: 1.8 mg/dL (ref 1.7–2.4)

## 2018-04-01 MED ORDER — METOPROLOL TARTRATE 25 MG PO TABS: 12.5000 mg | ORAL_TABLET | Freq: Two times a day (BID) | ORAL | 0 refills | Status: AC

## 2018-04-01 MED ORDER — POTASSIUM CHLORIDE ER 10 MEQ PO TBCR: 10.0000 meq | EXTENDED_RELEASE_TABLET | Freq: Every day | ORAL | 0 refills | Status: AC

## 2018-04-01 MED ORDER — POTASSIUM PHOSPHATE MONOBASIC 500 MG PO TABS
500.0000 mg | ORAL_TABLET | Freq: Once | ORAL | Status: AC
  Administered 2018-04-01: 500 mg via ORAL
  Filled 2018-04-01: qty 1

## 2018-04-01 NOTE — Progress Notes (Deleted)
[facility] Discharge Summary  Patient name: Kristin Wilkerson Medical record number: [id number] Date of birth: 11-Jun-1925 Age: 84 y.o. Gender: female Date of Admission: 03/27/2018  Date of Discharge: 04/01/2018 Admitting Physician: Alveda Reasons, MD  Primary Care Provider: Jodi Marble, MD Consultants: none  Indication for Hospitalization: altered mental status, UTI  Discharge Diagnoses/Problem List:  Altered mental status, Alzheimer's Dehydration UTI AKI Hypophosphatemia Hypokalemia Hypertension Sacral decubitus ulcers Right knee wound Heart failure with preserved ejection fraction History of PE Anxiety Previous CVA  [facility]  Discharge Condition: Stable  Discharge Exam:  General: Alert, NAD ENT: Nares clear, MMM, clear conjunctiva Cardiac: RRR 2/6 murmur Lungs: Clear bilaterally no increased work of breathing Abdomen: Soft, nontender, MSK: Moves all extremities spontaneously at will, ankle with bandages right knee with bandage Neuro alert but demented, in no distress  Brief Hospital Course:  [name] is a 84-year-old woman who presented 12/10 with altered mental status and acute kidney injury secondary to UTI.  She was admitted for management of her altered mental status.  She was given a total of a 4-day course of antibiotics (ceftriaxone x1, 3 days Keflex) and fluids.  Her mental status returned to her baseline.  During her time in the hospital, she and her daughter met with palliative care to discuss goals of care.  It was decided in that discussion that she would return to her SNF and change her status to hospice care.  From this point on they decided to focus on comfort care measures.  The daughter would like no further hospitalizations.  [name] was fully treated for UTI and dehydration.  She showed good clinical improvement and is found to be medically stable on the day of her discharge  (12/15.  Issues for Follow Up:  1. [name] is now on hospice care.  She acknowledged her desire for comfort care through her POA.  She is now DNR, no hospitalizations, antibiotics or aggressive medical interventions. 2. Pt showed low phosphate levels in the hospital.  Supplementation with daily skim milk will be helpful for her electrolyte balance.  Significant Procedures: none  Significant Labs and Imaging:  Recent Labs  Lab 03/28/18 0407 03/29/18 0414 04/01/18 0336  WBC 10.1 10.2 10.9*  HGB 12.3 11.6* 12.4  HCT 38.4 37.4 40.9  PLT 233 220 214   Recent Labs  Lab 03/27/18 2030 03/28/18 0407 03/29/18 0414 03/30/18 0357 03/31/18 0311 04/01/18 0336  NA 144 146* 147* 143 141 143  K 4.3 3.4* 3.4* 3.8 3.7 3.5  CL 109 112* 113* 105 106 103  CO2 24 23 24 26 26 27   GLUCOSE 107* 100* 86 100* 92 102*  BUN 69* 55* 28* 17 13 7*  CREATININE 2.78* 2.13* 1.43* 1.02* 1.02* 0.84  CALCIUM 9.5 9.0 8.7* 8.2* 8.2* 8.6*  MG  --   --  1.6* 2.1 1.8 1.8  PHOS  --   --  1.8* 2.4* 2.1* 2.4*  ALKPHOS 117  --   --   --   --   --   AST 21  --   --   --   --   --   ALT 18  --   --   --   --   --   ALBUMIN 3.0*  --   --   --   --   --     Ct Head Wo Contrast  Result Date: 03/27/2018 CLINICAL DATA:  Recent syncopal  episode EXAM: CT HEAD WITHOUT CONTRAST TECHNIQUE: Contiguous axial images were obtained from the base of the skull through the vertex without intravenous contrast. COMPARISON:  08/22/2016 FINDINGS: Brain: Atrophic changes are noted. Findings consistent with prior infarcts in right occipital lobe inferiorly is seen. No findings to suggest acute hemorrhage, acute infarction or space-occupying mass lesion are noted. Vascular: No hyperdense vessel or unexpected calcification. Skull: Normal. Negative for fracture or focal lesion. Sinuses/Orbits: No acute finding. Other: None. IMPRESSION: Chronic changes stable from the previous exam. No acute intracranial abnormality is noted. This  Electronically Signed   By: Inez Catalina M.D.   On: 03/27/2018 21:38   Dg Chest Portable 1 View  Result Date: 03/27/2018 CLINICAL DATA:  Generalized weakness EXAM: PORTABLE CHEST 1 VIEW COMPARISON:  08/25/2016 FINDINGS: Mild left lower lobe airspace disease may represent atelectasis or pneumonia. Right lung clear. Negative for heart failure or effusion. IMPRESSION: Mild left lower lobe atelectasis/infiltrate. Electronically Signed   By: Franchot Gallo M.D.   On: 03/27/2018 19:59     Results/Tests Pending at Time of Discharge: none  Discharge Medications:  Allergies as of 04/01/2018   No Known Allergies     Medication List    STOP taking these medications   calcium carbonate 500 MG chewable tablet Commonly known as:  TUMS - dosed in mg elemental calcium   cholecalciferol 1000 units tablet Commonly known as:  VITAMIN D   ferrous sulfate 325 (65 FE) MG tablet   furosemide 20 MG tablet Commonly known as:  LASIX   losartan 100 MG tablet Commonly known as:  COZAAR   vitamin C 500 MG tablet Commonly known as:  ASCORBIC ACID     TAKE these medications   acetaminophen 500 MG tablet Commonly known as:  TYLENOL Take 1,000 mg by mouth 3 (three) times daily.   albuterol (2.5 MG/3ML) 0.083% nebulizer solution Commonly known as:  PROVENTIL Take 2.5 mg by nebulization every 4 (four) hours as needed for wheezing or shortness of breath.   ALPRAZolam 0.25 MG tablet Commonly known as:  XANAX Take 0.25 mg by mouth at bedtime as needed for anxiety.   benzocaine-menthol 6-10 MG lozenge Commonly known as:  CHLORAEPTIC Take 1 lozenge by mouth as needed for sore throat.   DULoxetine 60 MG capsule Commonly known as:  CYMBALTA Take 1 capsule (60 mg total) by mouth daily. One daily to help nerves What changed:    additional instructions  Another medication with the same name was removed. Continue taking this medication, and follow the directions you see here.    fluticasone-salmeterol 115-21 MCG/ACT inhaler Commonly known as:  ADVAIR HFA Inhale 2 puffs into the lungs 2 (two) times daily.   guaiFENesin-dextromethorphan 100-10 MG/5ML syrup Commonly known as:  ROBITUSSIN DM Take 15 mLs by mouth every 4 (four) hours as needed for cough.   latanoprost 0.005 % ophthalmic solution Commonly known as:  XALATAN PLACE 1 DROP IN BOTH EYES AT BEDTIME TO TREAT GLAUCOMA What changed:  See the new instructions.   loperamide 2 MG capsule Commonly known as:  IMODIUM Take 2 mg by mouth See admin instructions. Take one tablet by mouth with each loose stool up to 8 doses in 24hrs, if persistent diarrhea notify MD   memantine 10 MG tablet Commonly known as:  NAMENDA Take 1 tablet (10 mg total) by mouth 2 (two) times daily.   metoprolol tartrate 25 MG tablet Commonly known as:  LOPRESSOR Take 0.5 tablets (12.5 mg total) by mouth 2 (  two) times daily. What changed:    medication strength  See the new instructions.   potassium chloride 10 MEQ tablet Commonly known as:  K-DUR Take 1 tablet (10 mEq total) by mouth daily. **Take with lasix** What changed:  when to take this   QUEtiapine 25 MG tablet Commonly known as:  SEROQUEL Take 25 mg by mouth 2 (two) times daily.   ranitidine 300 MG tablet Commonly known as:  ZANTAC Take 300 mg by mouth at bedtime.   sennosides-docusate sodium 8.6-50 MG tablet Commonly known as:  SENOKOT-S Take 2 tablets by mouth daily as needed for constipation.   warfarin 1 MG tablet Commonly known as:  COUMADIN Take 1 mg by mouth daily.       Discharge Instructions: Please refer to Patient Instructions section of EMR for full details.  Patient was counseled important signs and symptoms that should prompt return to medical care, changes in medications, dietary instructions, activity restrictions, and follow up appointments.   Follow-Up Appointments:   Matilde Haymaker, MD 04/01/2018, 11:34 AM PGY-1, [place]

## 2018-04-01 NOTE — Progress Notes (Signed)
Family Medicine Teaching Service Daily Progress Note Intern Pager: [phone]  Patient name: Kristin Wilkerson Medical record number: [id number] Date of birth: Feb 23, 1926 Age: 84 y.o. Gender: female  Primary Care Provider: Sherron Monday, MD Consultants: palliative Code Status: DNR  Pt Overview and Major Events to Date:  12/10 admit for AMS and AKI  Assessment and Plan: Kristin Wilkerson is a 83 y.o. female presenting with worsening mental status for the past two weeks in the setting of Alzheimer's disease and very poor oral intake, found to have an acute kidney injury and UTI secondary to dehydration.  PMH is significant for Alzheimer's dementia, hypertension, previous bilateral PE on warfarin therapy, previous CVA, normocytic anemia, anxiety/depression.  AMS in the setting of progressive Alzheimer's, dehydration and UTI: Acute on chronic, resolved.  At mental and physical baseline, continues to be confused however speech now coherent.  Patient to return to [facility] living facility, with hospice care. -Appreciate palliative care's recommendations -medically manage until d/c with hospice  UTI: POA, stable. Currently asymptomatic, culture pending. -abx now discontinued  Hypophosphatemia: Acute  12/15 labs pending Phosphorus 2.1 this a.m, s/p repletion yesterday.  Likely secondary to refeeding. - Monitor phosphorus -Replete with K Phos 500mg   - Will recommend daily milk for phosphorus supplementation at her living facility  AKI: Resolved. 12/15 labs pending Cr 1.02.  Baseline 0.9-1.0. AKI initially likely secondary to dehydration that has now resolved. -Monitor BMP - Holding home Lasix inpatient and at discharge, Encourage p.o. hydration  Hypokalemia: Resolved. 12/15 labs pending -Monitor BMP  Hypomagnesemia: Resolved. 12/15 labs pending Likely secondary to refeeding as above. -Monitor mag, replete as needed  Hypertension: Chronic, stable. SBP ranging 130-150s systolic. HR  90-110.  -Restart home metoprolol at a reduced dose, 25 mg twice daily (50 mg BID at home) -Continue to hold home losartan, given generally normotensive and especially with restarting metoprolol today  Gluteus, sacral decubitus ulcers, stage I & 2: Chronic, stable.  - Wound care per nursing - Frequent turning of patient  Right knee wound: Chronic, stable. Evaluated by wound care, recommends every shift dressing changes.  -Q shift dressing changes per nursing  HFpEF: Chronic, Stable.  Echo in 08/2016 showing EF 60-65%, G1DD.   - Monitor BP, continue blood pressure control -Continue to hold home Lasix, concern for precipitating dehydration in the setting of patient will poor p.o. intake - Will hold home Lasix at discharge  Alzheimer's Dementia: Chronic, worsening. Currently resides at [facility] in [place].  -Restart home memantine -Monitor mental status  Previous bilateral PE: Chronic, stable. -Continue home warfarin per pharmacy  Anxiety/Depression: Chronic, stable. Takes duloxetine at home. - Restart home duloxetine   Previous CVA: Chronic, stable.   Proximally about 6 years ago (in 2014). No residual deficits per family, however wheelchair-bound. - Monitor for any focal deficits   FEN/GI:  Regular diet as tolerated Prophylaxis: Warfarin per pharmacy, currently on SCDs  Disposition: Medically stable for discharge to hospice, [facility]  Subjective:  Demented but pleasant and in no distress on rounds, she asked for a glass of water which was brought to her  Objective: Temp:  [98.1 F (36.7 C)-98.8 F (37.1 C)] 98.3 F (36.8 C) (12/14 2100) Pulse Rate:  [96-108] 108 (12/14 2100) Resp:  [16] 16 (12/14 2100) BP: (119-150)/(57-92) 150/75 (12/14 2100) SpO2:  [94 %-100 %] 100 % (12/14 2100) Weight:  [74.5 kg] 74.5 kg (12/14 0700)  Physical Exam: General: Alert, NAD ENT: Nares clear, MMM, clear conjunctiva  Cardiac: RRR 2/6  murmur Lungs: Clear bilaterally no increased work of breathing Abdomen: Soft, nontender, MSK: Moves all extremities spontaneously at will, ankle with bandages right knee with bandage Neuro alert but demented, in no distress  Laboratory: Recent Labs  Lab 03/27/18 2030 03/28/18 0407 03/29/18 0414  WBC 11.1* 10.1 10.2  HGB 12.7 12.3 11.6*  HCT 41.6 38.4 37.4  PLT 224 233 220   Recent Labs  Lab 03/27/18 2030  03/29/18 0414 03/30/18 0357 03/31/18 0311  NA 144   < > 147* 143 141  K 4.3   < > 3.4* 3.8 3.7  CL 109   < > 113* 105 106  CO2 24   < > 24 26 26   BUN 69*   < > 28* 17 13  CREATININE 2.78*   < > 1.43* 1.02* 1.02*  CALCIUM 9.5   < > 8.7* 8.2* 8.2*  PROT 6.6  --   --   --   --   BILITOT 0.9  --   --   --   --   ALKPHOS 117  --   --   --   --   ALT 18  --   --   --   --   AST 21  --   --   --   --   GLUCOSE 107*   < > 86 100* 92   < > = values in this interval not displayed.      Imaging/Diagnostic Tests: Ct Head Wo Contrast  Result Date: 03/27/2018 CLINICAL DATA:  Recent syncopal episode EXAM: CT HEAD WITHOUT CONTRAST TECHNIQUE: Contiguous axial images were obtained from the base of the skull through the vertex without intravenous contrast. COMPARISON:  08/22/2016 FINDINGS: Brain: Atrophic changes are noted. Findings consistent with prior infarcts in right occipital lobe inferiorly is seen. No findings to suggest acute hemorrhage, acute infarction or space-occupying mass lesion are noted. Vascular: No hyperdense vessel or unexpected calcification. Skull: Normal. Negative for fracture or focal lesion. Sinuses/Orbits: No acute finding. Other: None. IMPRESSION: Chronic changes stable from the previous exam. No acute intracranial abnormality is noted. This Electronically Signed   By: Alcide CleverMark  Lukens M.D.   On: 03/27/2018 21:38   Dg Chest Portable 1 View  Result Date: 03/27/2018 CLINICAL DATA:  Generalized weakness EXAM: PORTABLE CHEST 1 VIEW COMPARISON:  08/25/2016 FINDINGS:  Mild left lower lobe airspace disease may represent atelectasis or pneumonia. Right lung clear. Negative for heart failure or effusion. IMPRESSION: Mild left lower lobe atelectasis/infiltrate. Electronically Signed   By: Marlan Palauharles  Clark M.D.   On: 03/27/2018 19:59     Marthenia RollingBland, Maclovio Henson, DO 04/01/2018, 4:03 AM PGY-2, [place] [facility] FPTS Intern pager: [phone], text pages welcome

## 2018-04-01 NOTE — Progress Notes (Signed)
Clinical Social Worker facilitated patient discharge including contacting patient family and facility to confirm patient discharge plans.  Clinical information faxed to facility and family agreeable with plan.  CSW arranged ambulance transport via [facility] to [facility] RN to call for report prior to [phone] Room 1311A).  Clinical Social Worker will sign off for now as social work intervention is no longer needed. Please consult us again if new need arises.  Mahamad Magnum Lunde LCSW [phone]

## 2018-04-01 NOTE — Plan of Care (Signed)
  Problem: Education: Goal: Knowledge of General Education information will improve Description Including pain rating scale, medication(s)/side effects and non-pharmacologic comfort measures Outcome: Progressing   Problem: Health Behavior/Discharge Planning: Goal: Ability to manage health-related needs will improve Outcome: Progressing   

## 2018-04-01 NOTE — Discharge Summary (Signed)
[facility] Discharge Summary  Patient name: Kristin Wilkerson Medical record number: [id number] Date of birth: August 03, 1925      Age: 84 y.o.    Gender: female Date of Admission: 03/27/2018                    Date of Discharge: 04/01/2018 Admitting Physician: Alveda Reasons, MD  Primary Care Provider: Jodi Marble, MD Consultants: none  Indication for Hospitalization: altered mental status, UTI  Discharge Diagnoses/Problem List:  Altered mental status, Alzheimer's Dehydration UTI AKI Hypophosphatemia Hypokalemia Hypertension Sacral decubitus ulcers Right knee wound Heart failure with preserved ejection fraction History of PE Anxiety Previous CVA  [place]  Discharge Condition: Stable  Discharge Exam:  General: Alert, NAD ENT: Nares clear, MMM, clear conjunctiva Cardiac: RRR 2/6 murmur Lungs: Clear bilaterally no increased work of breathing Abdomen: Soft, nontender, MSK: Moves all extremities spontaneously at will, ankle with bandages right knee with bandage Neuro alert but demented, in no distress  Brief Hospital Course:  [name] is a 84-year-old woman who presented 12/10 with altered mental status and acute kidney injury secondary to UTI.  She was admitted for management of her altered mental status.  She was given a total of a 4-day course of antibiotics (ceftriaxone x1, 3 days Keflex) and fluids.  Her mental status returned to her baseline.  During her time in the hospital, she and her daughter met with palliative care to discuss goals of care.  It was decided in that discussion that she would return to her SNF and change her status to hospice care.  From this point on they decided to focus on comfort care measures.  The daughter would like no further hospitalizations.  [name] was fully treated for UTI and dehydration.  She showed good clinical improvement and is found to be medically stable  on the day of her discharge (12/15.  Issues for Follow Up:  1. [name] is now on hospice care.  She acknowledged her desire for comfort care through her POA.  She is now DNR, no hospitalizations, antibiotics or aggressive medical interventions. 2. Pt showed low phosphate levels in the hospital.  Supplementation with daily skim milk will be helpful for her electrolyte balance.  Significant Procedures: none  Significant Labs and Imaging:  LastLabs       Recent Labs  Lab 03/28/18 0407 03/29/18 0414 04/01/18 0336  WBC 10.1 10.2 10.9*  HGB 12.3 11.6* 12.4  HCT 38.4 37.4 40.9  PLT 233 220 214     LastLabs          Recent Labs  Lab 03/27/18 2030 03/28/18 0407 03/29/18 0414 03/30/18 0357 03/31/18 0311 04/01/18 0336  NA 144 146* 147* 143 141 143  K 4.3 3.4* 3.4* 3.8 3.7 3.5  CL 109 112* 113* 105 106 103  CO2 24 23 24 26 26 27   GLUCOSE 107* 100* 86 100* 92 102*  BUN 69* 55* 28* 17 13 7*  CREATININE 2.78* 2.13* 1.43* 1.02* 1.02* 0.84  CALCIUM 9.5 9.0 8.7* 8.2* 8.2* 8.6*  MG  --   --  1.6* 2.1 1.8 1.8  PHOS  --   --  1.8* 2.4* 2.1* 2.4*  ALKPHOS 117  --   --   --   --   --   AST 21  --   --   --   --   --   ALT 18  --   --   --   --   --  ALBUMIN 3.0*  --   --   --   --   --        ImagingResults  Ct Head Wo Contrast  Result Date: 03/27/2018 CLINICAL DATA:  Recent syncopal episode EXAM: CT HEAD WITHOUT CONTRAST TECHNIQUE: Contiguous axial images were obtained from the base of the skull through the vertex without intravenous contrast. COMPARISON:  08/22/2016 FINDINGS: Brain: Atrophic changes are noted. Findings consistent with prior infarcts in right occipital lobe inferiorly is seen. No findings to suggest acute hemorrhage, acute infarction or space-occupying mass lesion are noted. Vascular: No hyperdense vessel or unexpected calcification. Skull: Normal. Negative for fracture or focal lesion. Sinuses/Orbits: No acute finding. Other: None. IMPRESSION: Chronic  changes stable from the previous exam. No acute intracranial abnormality is noted. This Electronically Signed   By: Inez Catalina M.D.   On: 03/27/2018 21:38   Dg Chest Portable 1 View  Result Date: 03/27/2018 CLINICAL DATA:  Generalized weakness EXAM: PORTABLE CHEST 1 VIEW COMPARISON:  08/25/2016 FINDINGS: Mild left lower lobe airspace disease may represent atelectasis or pneumonia. Right lung clear. Negative for heart failure or effusion. IMPRESSION: Mild left lower lobe atelectasis/infiltrate. Electronically Signed   By: Franchot Gallo M.D.   On: 03/27/2018 19:59      Results/Tests Pending at Time of Discharge: none  Discharge Medications:  Allergies as of 04/01/2018   No Known Allergies        Medication List    STOP taking these medications   calcium carbonate 500 MG chewable tablet Commonly known as:  TUMS - dosed in mg elemental calcium   cholecalciferol 1000 units tablet Commonly known as:  VITAMIN D   ferrous sulfate 325 (65 FE) MG tablet   furosemide 20 MG tablet Commonly known as:  LASIX   losartan 100 MG tablet Commonly known as:  COZAAR   vitamin C 500 MG tablet Commonly known as:  ASCORBIC ACID     TAKE these medications   acetaminophen 500 MG tablet Commonly known as:  TYLENOL Take 1,000 mg by mouth 3 (three) times daily.   albuterol (2.5 MG/3ML) 0.083% nebulizer solution Commonly known as:  PROVENTIL Take 2.5 mg by nebulization every 4 (four) hours as needed for wheezing or shortness of breath.   ALPRAZolam 0.25 MG tablet Commonly known as:  XANAX Take 0.25 mg by mouth at bedtime as needed for anxiety.   benzocaine-menthol 6-10 MG lozenge Commonly known as:  CHLORAEPTIC Take 1 lozenge by mouth as needed for sore throat.   DULoxetine 60 MG capsule Commonly known as:  CYMBALTA Take 1 capsule (60 mg total) by mouth daily. One daily to help nerves What changed:    additional instructions  Another medication with the same  name was removed. Continue taking this medication, and follow the directions you see here.   fluticasone-salmeterol 115-21 MCG/ACT inhaler Commonly known as:  ADVAIR HFA Inhale 2 puffs into the lungs 2 (two) times daily.   guaiFENesin-dextromethorphan 100-10 MG/5ML syrup Commonly known as:  ROBITUSSIN DM Take 15 mLs by mouth every 4 (four) hours as needed for cough.   latanoprost 0.005 % ophthalmic solution Commonly known as:  XALATAN PLACE 1 DROP IN BOTH EYES AT BEDTIME TO TREAT GLAUCOMA What changed:  See the new instructions.   loperamide 2 MG capsule Commonly known as:  IMODIUM Take 2 mg by mouth See admin instructions. Take one tablet by mouth with each loose stool up to 8 doses in 24hrs, if persistent diarrhea notify MD  memantine 10 MG tablet Commonly known as:  NAMENDA Take 1 tablet (10 mg total) by mouth 2 (two) times daily.   metoprolol tartrate 25 MG tablet Commonly known as:  LOPRESSOR Take 0.5 tablets (12.5 mg total) by mouth 2 (two) times daily. What changed:    medication strength  See the new instructions.   potassium chloride 10 MEQ tablet Commonly known as:  K-DUR Take 1 tablet (10 mEq total) by mouth daily. **Take with lasix** What changed:  when to take this   QUEtiapine 25 MG tablet Commonly known as:  SEROQUEL Take 25 mg by mouth 2 (two) times daily.   ranitidine 300 MG tablet Commonly known as:  ZANTAC Take 300 mg by mouth at bedtime.   sennosides-docusate sodium 8.6-50 MG tablet Commonly known as:  SENOKOT-S Take 2 tablets by mouth daily as needed for constipation.   warfarin 1 MG tablet Commonly known as:  COUMADIN Take 1 mg by mouth daily.       Discharge Instructions: Please refer to Patient Instructions section of EMR for full details.  Patient was counseled important signs and symptoms that should prompt return to medical care, changes in medications, dietary instructions, activity restrictions, and follow up  appointments.   Follow-Up Appointments:  Matilde Haymaker, MD 04/01/2018, 11:34 AM PGY-1, [place]

## 2018-04-04 DIAGNOSIS — Z86711 Personal history of pulmonary embolism: Secondary | ICD-10-CM | POA: Diagnosis not present

## 2018-04-04 DIAGNOSIS — I10 Essential (primary) hypertension: Secondary | ICD-10-CM | POA: Diagnosis not present

## 2018-04-04 DIAGNOSIS — S81001A Unspecified open wound, right knee, initial encounter: Secondary | ICD-10-CM | POA: Diagnosis not present

## 2018-04-04 DIAGNOSIS — K219 Gastro-esophageal reflux disease without esophagitis: Secondary | ICD-10-CM | POA: Diagnosis not present

## 2018-04-04 DIAGNOSIS — H409 Unspecified glaucoma: Secondary | ICD-10-CM | POA: Diagnosis not present

## 2018-04-04 DIAGNOSIS — F411 Generalized anxiety disorder: Secondary | ICD-10-CM | POA: Diagnosis not present

## 2018-04-04 DIAGNOSIS — G309 Alzheimer's disease, unspecified: Secondary | ICD-10-CM | POA: Diagnosis not present

## 2018-04-04 DIAGNOSIS — Z8673 Personal history of transient ischemic attack (TIA), and cerebral infarction without residual deficits: Secondary | ICD-10-CM | POA: Diagnosis not present

## 2018-04-04 DIAGNOSIS — J4520 Mild intermittent asthma, uncomplicated: Secondary | ICD-10-CM | POA: Diagnosis not present

## 2018-04-04 DIAGNOSIS — E785 Hyperlipidemia, unspecified: Secondary | ICD-10-CM | POA: Diagnosis not present

## 2018-04-04 DIAGNOSIS — M1990 Unspecified osteoarthritis, unspecified site: Secondary | ICD-10-CM | POA: Diagnosis not present

## 2018-04-04 DIAGNOSIS — I509 Heart failure, unspecified: Secondary | ICD-10-CM | POA: Diagnosis not present

## 2018-04-05 DIAGNOSIS — E785 Hyperlipidemia, unspecified: Secondary | ICD-10-CM | POA: Diagnosis not present

## 2018-04-05 DIAGNOSIS — N179 Acute kidney failure, unspecified: Secondary | ICD-10-CM | POA: Diagnosis not present

## 2018-04-05 DIAGNOSIS — I10 Essential (primary) hypertension: Secondary | ICD-10-CM | POA: Diagnosis not present

## 2018-04-05 DIAGNOSIS — G309 Alzheimer's disease, unspecified: Secondary | ICD-10-CM | POA: Diagnosis not present

## 2018-04-05 DIAGNOSIS — I509 Heart failure, unspecified: Secondary | ICD-10-CM | POA: Diagnosis not present

## 2018-04-05 DIAGNOSIS — N390 Urinary tract infection, site not specified: Secondary | ICD-10-CM | POA: Diagnosis not present

## 2018-04-05 DIAGNOSIS — J4520 Mild intermittent asthma, uncomplicated: Secondary | ICD-10-CM | POA: Diagnosis not present

## 2018-04-05 DIAGNOSIS — Z8673 Personal history of transient ischemic attack (TIA), and cerebral infarction without residual deficits: Secondary | ICD-10-CM | POA: Diagnosis not present

## 2018-04-10 DIAGNOSIS — G309 Alzheimer's disease, unspecified: Secondary | ICD-10-CM | POA: Diagnosis not present

## 2018-04-10 DIAGNOSIS — I10 Essential (primary) hypertension: Secondary | ICD-10-CM | POA: Diagnosis not present

## 2018-04-10 DIAGNOSIS — J4520 Mild intermittent asthma, uncomplicated: Secondary | ICD-10-CM | POA: Diagnosis not present

## 2018-04-10 DIAGNOSIS — I5032 Chronic diastolic (congestive) heart failure: Secondary | ICD-10-CM | POA: Diagnosis not present

## 2018-04-10 DIAGNOSIS — E785 Hyperlipidemia, unspecified: Secondary | ICD-10-CM | POA: Diagnosis not present

## 2018-04-10 DIAGNOSIS — I509 Heart failure, unspecified: Secondary | ICD-10-CM | POA: Diagnosis not present

## 2018-04-10 DIAGNOSIS — Z8673 Personal history of transient ischemic attack (TIA), and cerebral infarction without residual deficits: Secondary | ICD-10-CM | POA: Diagnosis not present

## 2018-04-13 DIAGNOSIS — E785 Hyperlipidemia, unspecified: Secondary | ICD-10-CM | POA: Diagnosis not present

## 2018-04-13 DIAGNOSIS — I509 Heart failure, unspecified: Secondary | ICD-10-CM | POA: Diagnosis not present

## 2018-04-13 DIAGNOSIS — J4520 Mild intermittent asthma, uncomplicated: Secondary | ICD-10-CM | POA: Diagnosis not present

## 2018-04-13 DIAGNOSIS — I10 Essential (primary) hypertension: Secondary | ICD-10-CM | POA: Diagnosis not present

## 2018-04-13 DIAGNOSIS — G309 Alzheimer's disease, unspecified: Secondary | ICD-10-CM | POA: Diagnosis not present

## 2018-04-13 DIAGNOSIS — Z8673 Personal history of transient ischemic attack (TIA), and cerebral infarction without residual deficits: Secondary | ICD-10-CM | POA: Diagnosis not present

## 2018-04-16 DIAGNOSIS — E785 Hyperlipidemia, unspecified: Secondary | ICD-10-CM | POA: Diagnosis not present

## 2018-04-16 DIAGNOSIS — G309 Alzheimer's disease, unspecified: Secondary | ICD-10-CM | POA: Diagnosis not present

## 2018-04-16 DIAGNOSIS — J4520 Mild intermittent asthma, uncomplicated: Secondary | ICD-10-CM | POA: Diagnosis not present

## 2018-04-16 DIAGNOSIS — I10 Essential (primary) hypertension: Secondary | ICD-10-CM | POA: Diagnosis not present

## 2018-04-16 DIAGNOSIS — I509 Heart failure, unspecified: Secondary | ICD-10-CM | POA: Diagnosis not present

## 2018-04-16 DIAGNOSIS — Z8673 Personal history of transient ischemic attack (TIA), and cerebral infarction without residual deficits: Secondary | ICD-10-CM | POA: Diagnosis not present

## 2018-04-17 DIAGNOSIS — R609 Edema, unspecified: Secondary | ICD-10-CM | POA: Diagnosis not present

## 2018-04-17 DIAGNOSIS — D649 Anemia, unspecified: Secondary | ICD-10-CM | POA: Diagnosis not present

## 2018-04-17 DIAGNOSIS — R600 Localized edema: Secondary | ICD-10-CM | POA: Diagnosis not present

## 2018-04-18 DIAGNOSIS — S81001A Unspecified open wound, right knee, initial encounter: Secondary | ICD-10-CM | POA: Diagnosis not present

## 2018-04-18 DIAGNOSIS — F411 Generalized anxiety disorder: Secondary | ICD-10-CM | POA: Diagnosis not present

## 2018-04-18 DIAGNOSIS — Z8673 Personal history of transient ischemic attack (TIA), and cerebral infarction without residual deficits: Secondary | ICD-10-CM | POA: Diagnosis not present

## 2018-04-18 DIAGNOSIS — I509 Heart failure, unspecified: Secondary | ICD-10-CM | POA: Diagnosis not present

## 2018-04-18 DIAGNOSIS — H409 Unspecified glaucoma: Secondary | ICD-10-CM | POA: Diagnosis not present

## 2018-04-18 DIAGNOSIS — K219 Gastro-esophageal reflux disease without esophagitis: Secondary | ICD-10-CM | POA: Diagnosis not present

## 2018-04-18 DIAGNOSIS — J4520 Mild intermittent asthma, uncomplicated: Secondary | ICD-10-CM | POA: Diagnosis not present

## 2018-04-18 DIAGNOSIS — M1990 Unspecified osteoarthritis, unspecified site: Secondary | ICD-10-CM | POA: Diagnosis not present

## 2018-04-18 DIAGNOSIS — I10 Essential (primary) hypertension: Secondary | ICD-10-CM | POA: Diagnosis not present

## 2018-04-18 DIAGNOSIS — Z86711 Personal history of pulmonary embolism: Secondary | ICD-10-CM | POA: Diagnosis not present

## 2018-04-18 DIAGNOSIS — G309 Alzheimer's disease, unspecified: Secondary | ICD-10-CM | POA: Diagnosis not present

## 2018-04-18 DIAGNOSIS — E785 Hyperlipidemia, unspecified: Secondary | ICD-10-CM | POA: Diagnosis not present

## 2018-04-20 DIAGNOSIS — Z8673 Personal history of transient ischemic attack (TIA), and cerebral infarction without residual deficits: Secondary | ICD-10-CM | POA: Diagnosis not present

## 2018-04-20 DIAGNOSIS — I10 Essential (primary) hypertension: Secondary | ICD-10-CM | POA: Diagnosis not present

## 2018-04-20 DIAGNOSIS — J4520 Mild intermittent asthma, uncomplicated: Secondary | ICD-10-CM | POA: Diagnosis not present

## 2018-04-20 DIAGNOSIS — E785 Hyperlipidemia, unspecified: Secondary | ICD-10-CM | POA: Diagnosis not present

## 2018-04-20 DIAGNOSIS — G309 Alzheimer's disease, unspecified: Secondary | ICD-10-CM | POA: Diagnosis not present

## 2018-04-20 DIAGNOSIS — I509 Heart failure, unspecified: Secondary | ICD-10-CM | POA: Diagnosis not present

## 2018-04-23 DIAGNOSIS — G309 Alzheimer's disease, unspecified: Secondary | ICD-10-CM | POA: Diagnosis not present

## 2018-04-23 DIAGNOSIS — J4520 Mild intermittent asthma, uncomplicated: Secondary | ICD-10-CM | POA: Diagnosis not present

## 2018-04-23 DIAGNOSIS — E785 Hyperlipidemia, unspecified: Secondary | ICD-10-CM | POA: Diagnosis not present

## 2018-04-23 DIAGNOSIS — I10 Essential (primary) hypertension: Secondary | ICD-10-CM | POA: Diagnosis not present

## 2018-04-23 DIAGNOSIS — I509 Heart failure, unspecified: Secondary | ICD-10-CM | POA: Diagnosis not present

## 2018-04-23 DIAGNOSIS — Z8673 Personal history of transient ischemic attack (TIA), and cerebral infarction without residual deficits: Secondary | ICD-10-CM | POA: Diagnosis not present

## 2018-04-24 DIAGNOSIS — E785 Hyperlipidemia, unspecified: Secondary | ICD-10-CM | POA: Diagnosis not present

## 2018-04-24 DIAGNOSIS — I509 Heart failure, unspecified: Secondary | ICD-10-CM | POA: Diagnosis not present

## 2018-04-24 DIAGNOSIS — J4520 Mild intermittent asthma, uncomplicated: Secondary | ICD-10-CM | POA: Diagnosis not present

## 2018-04-24 DIAGNOSIS — Z8673 Personal history of transient ischemic attack (TIA), and cerebral infarction without residual deficits: Secondary | ICD-10-CM | POA: Diagnosis not present

## 2018-04-24 DIAGNOSIS — G309 Alzheimer's disease, unspecified: Secondary | ICD-10-CM | POA: Diagnosis not present

## 2018-04-24 DIAGNOSIS — I10 Essential (primary) hypertension: Secondary | ICD-10-CM | POA: Diagnosis not present

## 2018-04-25 DIAGNOSIS — G309 Alzheimer's disease, unspecified: Secondary | ICD-10-CM | POA: Diagnosis not present

## 2018-04-25 DIAGNOSIS — I10 Essential (primary) hypertension: Secondary | ICD-10-CM | POA: Diagnosis not present

## 2018-04-25 DIAGNOSIS — E785 Hyperlipidemia, unspecified: Secondary | ICD-10-CM | POA: Diagnosis not present

## 2018-04-25 DIAGNOSIS — J4520 Mild intermittent asthma, uncomplicated: Secondary | ICD-10-CM | POA: Diagnosis not present

## 2018-04-25 DIAGNOSIS — Z8673 Personal history of transient ischemic attack (TIA), and cerebral infarction without residual deficits: Secondary | ICD-10-CM | POA: Diagnosis not present

## 2018-04-25 DIAGNOSIS — I509 Heart failure, unspecified: Secondary | ICD-10-CM | POA: Diagnosis not present

## 2018-04-26 DIAGNOSIS — I509 Heart failure, unspecified: Secondary | ICD-10-CM | POA: Diagnosis not present

## 2018-04-26 DIAGNOSIS — J4520 Mild intermittent asthma, uncomplicated: Secondary | ICD-10-CM | POA: Diagnosis not present

## 2018-04-26 DIAGNOSIS — G309 Alzheimer's disease, unspecified: Secondary | ICD-10-CM | POA: Diagnosis not present

## 2018-04-26 DIAGNOSIS — E785 Hyperlipidemia, unspecified: Secondary | ICD-10-CM | POA: Diagnosis not present

## 2018-04-26 DIAGNOSIS — I10 Essential (primary) hypertension: Secondary | ICD-10-CM | POA: Diagnosis not present

## 2018-04-26 DIAGNOSIS — Z8673 Personal history of transient ischemic attack (TIA), and cerebral infarction without residual deficits: Secondary | ICD-10-CM | POA: Diagnosis not present

## 2018-04-30 DIAGNOSIS — J4520 Mild intermittent asthma, uncomplicated: Secondary | ICD-10-CM | POA: Diagnosis not present

## 2018-04-30 DIAGNOSIS — Z8673 Personal history of transient ischemic attack (TIA), and cerebral infarction without residual deficits: Secondary | ICD-10-CM | POA: Diagnosis not present

## 2018-04-30 DIAGNOSIS — I10 Essential (primary) hypertension: Secondary | ICD-10-CM | POA: Diagnosis not present

## 2018-04-30 DIAGNOSIS — E785 Hyperlipidemia, unspecified: Secondary | ICD-10-CM | POA: Diagnosis not present

## 2018-04-30 DIAGNOSIS — G309 Alzheimer's disease, unspecified: Secondary | ICD-10-CM | POA: Diagnosis not present

## 2018-04-30 DIAGNOSIS — I509 Heart failure, unspecified: Secondary | ICD-10-CM | POA: Diagnosis not present

## 2018-05-02 DIAGNOSIS — I10 Essential (primary) hypertension: Secondary | ICD-10-CM | POA: Diagnosis not present

## 2018-05-02 DIAGNOSIS — G309 Alzheimer's disease, unspecified: Secondary | ICD-10-CM | POA: Diagnosis not present

## 2018-05-02 DIAGNOSIS — I509 Heart failure, unspecified: Secondary | ICD-10-CM | POA: Diagnosis not present

## 2018-05-02 DIAGNOSIS — J4520 Mild intermittent asthma, uncomplicated: Secondary | ICD-10-CM | POA: Diagnosis not present

## 2018-05-02 DIAGNOSIS — Z8673 Personal history of transient ischemic attack (TIA), and cerebral infarction without residual deficits: Secondary | ICD-10-CM | POA: Diagnosis not present

## 2018-05-02 DIAGNOSIS — E785 Hyperlipidemia, unspecified: Secondary | ICD-10-CM | POA: Diagnosis not present

## 2018-05-03 DIAGNOSIS — M25461 Effusion, right knee: Secondary | ICD-10-CM | POA: Diagnosis not present

## 2018-05-03 DIAGNOSIS — R609 Edema, unspecified: Secondary | ICD-10-CM | POA: Diagnosis not present

## 2018-05-04 DIAGNOSIS — I509 Heart failure, unspecified: Secondary | ICD-10-CM | POA: Diagnosis not present

## 2018-05-04 DIAGNOSIS — I10 Essential (primary) hypertension: Secondary | ICD-10-CM | POA: Diagnosis not present

## 2018-05-04 DIAGNOSIS — J4520 Mild intermittent asthma, uncomplicated: Secondary | ICD-10-CM | POA: Diagnosis not present

## 2018-05-04 DIAGNOSIS — E785 Hyperlipidemia, unspecified: Secondary | ICD-10-CM | POA: Diagnosis not present

## 2018-05-04 DIAGNOSIS — G309 Alzheimer's disease, unspecified: Secondary | ICD-10-CM | POA: Diagnosis not present

## 2018-05-04 DIAGNOSIS — Z8673 Personal history of transient ischemic attack (TIA), and cerebral infarction without residual deficits: Secondary | ICD-10-CM | POA: Diagnosis not present

## 2018-05-07 DIAGNOSIS — G309 Alzheimer's disease, unspecified: Secondary | ICD-10-CM | POA: Diagnosis not present

## 2018-05-07 DIAGNOSIS — Z8673 Personal history of transient ischemic attack (TIA), and cerebral infarction without residual deficits: Secondary | ICD-10-CM | POA: Diagnosis not present

## 2018-05-07 DIAGNOSIS — E785 Hyperlipidemia, unspecified: Secondary | ICD-10-CM | POA: Diagnosis not present

## 2018-05-07 DIAGNOSIS — J4520 Mild intermittent asthma, uncomplicated: Secondary | ICD-10-CM | POA: Diagnosis not present

## 2018-05-07 DIAGNOSIS — I509 Heart failure, unspecified: Secondary | ICD-10-CM | POA: Diagnosis not present

## 2018-05-07 DIAGNOSIS — I10 Essential (primary) hypertension: Secondary | ICD-10-CM | POA: Diagnosis not present

## 2018-05-09 DIAGNOSIS — I10 Essential (primary) hypertension: Secondary | ICD-10-CM | POA: Diagnosis not present

## 2018-05-09 DIAGNOSIS — E785 Hyperlipidemia, unspecified: Secondary | ICD-10-CM | POA: Diagnosis not present

## 2018-05-09 DIAGNOSIS — G309 Alzheimer's disease, unspecified: Secondary | ICD-10-CM | POA: Diagnosis not present

## 2018-05-09 DIAGNOSIS — I509 Heart failure, unspecified: Secondary | ICD-10-CM | POA: Diagnosis not present

## 2018-05-09 DIAGNOSIS — Z8673 Personal history of transient ischemic attack (TIA), and cerebral infarction without residual deficits: Secondary | ICD-10-CM | POA: Diagnosis not present

## 2018-05-09 DIAGNOSIS — J4520 Mild intermittent asthma, uncomplicated: Secondary | ICD-10-CM | POA: Diagnosis not present

## 2018-05-11 DIAGNOSIS — Z8673 Personal history of transient ischemic attack (TIA), and cerebral infarction without residual deficits: Secondary | ICD-10-CM | POA: Diagnosis not present

## 2018-05-11 DIAGNOSIS — I509 Heart failure, unspecified: Secondary | ICD-10-CM | POA: Diagnosis not present

## 2018-05-11 DIAGNOSIS — G309 Alzheimer's disease, unspecified: Secondary | ICD-10-CM | POA: Diagnosis not present

## 2018-05-11 DIAGNOSIS — E785 Hyperlipidemia, unspecified: Secondary | ICD-10-CM | POA: Diagnosis not present

## 2018-05-11 DIAGNOSIS — I10 Essential (primary) hypertension: Secondary | ICD-10-CM | POA: Diagnosis not present

## 2018-05-11 DIAGNOSIS — J4520 Mild intermittent asthma, uncomplicated: Secondary | ICD-10-CM | POA: Diagnosis not present

## 2018-05-12 DIAGNOSIS — I509 Heart failure, unspecified: Secondary | ICD-10-CM | POA: Diagnosis not present

## 2018-05-12 DIAGNOSIS — I10 Essential (primary) hypertension: Secondary | ICD-10-CM | POA: Diagnosis not present

## 2018-05-12 DIAGNOSIS — E785 Hyperlipidemia, unspecified: Secondary | ICD-10-CM | POA: Diagnosis not present

## 2018-05-12 DIAGNOSIS — J4520 Mild intermittent asthma, uncomplicated: Secondary | ICD-10-CM | POA: Diagnosis not present

## 2018-05-12 DIAGNOSIS — Z8673 Personal history of transient ischemic attack (TIA), and cerebral infarction without residual deficits: Secondary | ICD-10-CM | POA: Diagnosis not present

## 2018-05-12 DIAGNOSIS — G309 Alzheimer's disease, unspecified: Secondary | ICD-10-CM | POA: Diagnosis not present

## 2018-05-14 DIAGNOSIS — I10 Essential (primary) hypertension: Secondary | ICD-10-CM | POA: Diagnosis not present

## 2018-05-14 DIAGNOSIS — Z8673 Personal history of transient ischemic attack (TIA), and cerebral infarction without residual deficits: Secondary | ICD-10-CM | POA: Diagnosis not present

## 2018-05-14 DIAGNOSIS — G309 Alzheimer's disease, unspecified: Secondary | ICD-10-CM | POA: Diagnosis not present

## 2018-05-14 DIAGNOSIS — I509 Heart failure, unspecified: Secondary | ICD-10-CM | POA: Diagnosis not present

## 2018-05-14 DIAGNOSIS — E785 Hyperlipidemia, unspecified: Secondary | ICD-10-CM | POA: Diagnosis not present

## 2018-05-14 DIAGNOSIS — J4520 Mild intermittent asthma, uncomplicated: Secondary | ICD-10-CM | POA: Diagnosis not present

## 2018-05-16 DIAGNOSIS — E785 Hyperlipidemia, unspecified: Secondary | ICD-10-CM | POA: Diagnosis not present

## 2018-05-16 DIAGNOSIS — I10 Essential (primary) hypertension: Secondary | ICD-10-CM | POA: Diagnosis not present

## 2018-05-16 DIAGNOSIS — Z8673 Personal history of transient ischemic attack (TIA), and cerebral infarction without residual deficits: Secondary | ICD-10-CM | POA: Diagnosis not present

## 2018-05-16 DIAGNOSIS — G309 Alzheimer's disease, unspecified: Secondary | ICD-10-CM | POA: Diagnosis not present

## 2018-05-16 DIAGNOSIS — I509 Heart failure, unspecified: Secondary | ICD-10-CM | POA: Diagnosis not present

## 2018-05-16 DIAGNOSIS — J4520 Mild intermittent asthma, uncomplicated: Secondary | ICD-10-CM | POA: Diagnosis not present

## 2018-05-18 DIAGNOSIS — I5032 Chronic diastolic (congestive) heart failure: Secondary | ICD-10-CM | POA: Diagnosis not present

## 2018-05-18 DIAGNOSIS — L02415 Cutaneous abscess of right lower limb: Secondary | ICD-10-CM | POA: Diagnosis not present

## 2018-05-18 DIAGNOSIS — F0390 Unspecified dementia without behavioral disturbance: Secondary | ICD-10-CM | POA: Diagnosis not present

## 2018-05-18 DIAGNOSIS — Z8673 Personal history of transient ischemic attack (TIA), and cerebral infarction without residual deficits: Secondary | ICD-10-CM | POA: Diagnosis not present

## 2018-05-18 DIAGNOSIS — E785 Hyperlipidemia, unspecified: Secondary | ICD-10-CM | POA: Diagnosis not present

## 2018-05-18 DIAGNOSIS — I509 Heart failure, unspecified: Secondary | ICD-10-CM | POA: Diagnosis not present

## 2018-05-18 DIAGNOSIS — G309 Alzheimer's disease, unspecified: Secondary | ICD-10-CM | POA: Diagnosis not present

## 2018-05-18 DIAGNOSIS — J4520 Mild intermittent asthma, uncomplicated: Secondary | ICD-10-CM | POA: Diagnosis not present

## 2018-05-18 DIAGNOSIS — I10 Essential (primary) hypertension: Secondary | ICD-10-CM | POA: Diagnosis not present

## 2018-05-19 DIAGNOSIS — E785 Hyperlipidemia, unspecified: Secondary | ICD-10-CM | POA: Diagnosis not present

## 2018-05-19 DIAGNOSIS — H409 Unspecified glaucoma: Secondary | ICD-10-CM | POA: Diagnosis not present

## 2018-05-19 DIAGNOSIS — S81001A Unspecified open wound, right knee, initial encounter: Secondary | ICD-10-CM | POA: Diagnosis not present

## 2018-05-19 DIAGNOSIS — J4520 Mild intermittent asthma, uncomplicated: Secondary | ICD-10-CM | POA: Diagnosis not present

## 2018-05-19 DIAGNOSIS — G309 Alzheimer's disease, unspecified: Secondary | ICD-10-CM | POA: Diagnosis not present

## 2018-05-19 DIAGNOSIS — K219 Gastro-esophageal reflux disease without esophagitis: Secondary | ICD-10-CM | POA: Diagnosis not present

## 2018-05-19 DIAGNOSIS — F411 Generalized anxiety disorder: Secondary | ICD-10-CM | POA: Diagnosis not present

## 2018-05-19 DIAGNOSIS — I509 Heart failure, unspecified: Secondary | ICD-10-CM | POA: Diagnosis not present

## 2018-05-19 DIAGNOSIS — I10 Essential (primary) hypertension: Secondary | ICD-10-CM | POA: Diagnosis not present

## 2018-05-19 DIAGNOSIS — M1990 Unspecified osteoarthritis, unspecified site: Secondary | ICD-10-CM | POA: Diagnosis not present

## 2018-05-19 DIAGNOSIS — Z86711 Personal history of pulmonary embolism: Secondary | ICD-10-CM | POA: Diagnosis not present

## 2018-05-19 DIAGNOSIS — Z8673 Personal history of transient ischemic attack (TIA), and cerebral infarction without residual deficits: Secondary | ICD-10-CM | POA: Diagnosis not present

## 2018-05-21 DIAGNOSIS — G309 Alzheimer's disease, unspecified: Secondary | ICD-10-CM | POA: Diagnosis not present

## 2018-05-21 DIAGNOSIS — J4520 Mild intermittent asthma, uncomplicated: Secondary | ICD-10-CM | POA: Diagnosis not present

## 2018-05-21 DIAGNOSIS — E785 Hyperlipidemia, unspecified: Secondary | ICD-10-CM | POA: Diagnosis not present

## 2018-05-21 DIAGNOSIS — Z8673 Personal history of transient ischemic attack (TIA), and cerebral infarction without residual deficits: Secondary | ICD-10-CM | POA: Diagnosis not present

## 2018-05-21 DIAGNOSIS — I509 Heart failure, unspecified: Secondary | ICD-10-CM | POA: Diagnosis not present

## 2018-05-21 DIAGNOSIS — I10 Essential (primary) hypertension: Secondary | ICD-10-CM | POA: Diagnosis not present

## 2018-05-23 DIAGNOSIS — I509 Heart failure, unspecified: Secondary | ICD-10-CM | POA: Diagnosis not present

## 2018-05-23 DIAGNOSIS — Z8673 Personal history of transient ischemic attack (TIA), and cerebral infarction without residual deficits: Secondary | ICD-10-CM | POA: Diagnosis not present

## 2018-05-23 DIAGNOSIS — E785 Hyperlipidemia, unspecified: Secondary | ICD-10-CM | POA: Diagnosis not present

## 2018-05-23 DIAGNOSIS — J4520 Mild intermittent asthma, uncomplicated: Secondary | ICD-10-CM | POA: Diagnosis not present

## 2018-05-23 DIAGNOSIS — G309 Alzheimer's disease, unspecified: Secondary | ICD-10-CM | POA: Diagnosis not present

## 2018-05-23 DIAGNOSIS — I10 Essential (primary) hypertension: Secondary | ICD-10-CM | POA: Diagnosis not present

## 2018-05-25 DIAGNOSIS — E785 Hyperlipidemia, unspecified: Secondary | ICD-10-CM | POA: Diagnosis not present

## 2018-05-25 DIAGNOSIS — Z8673 Personal history of transient ischemic attack (TIA), and cerebral infarction without residual deficits: Secondary | ICD-10-CM | POA: Diagnosis not present

## 2018-05-25 DIAGNOSIS — I10 Essential (primary) hypertension: Secondary | ICD-10-CM | POA: Diagnosis not present

## 2018-05-25 DIAGNOSIS — G309 Alzheimer's disease, unspecified: Secondary | ICD-10-CM | POA: Diagnosis not present

## 2018-05-25 DIAGNOSIS — J4520 Mild intermittent asthma, uncomplicated: Secondary | ICD-10-CM | POA: Diagnosis not present

## 2018-05-25 DIAGNOSIS — I509 Heart failure, unspecified: Secondary | ICD-10-CM | POA: Diagnosis not present

## 2018-05-28 DIAGNOSIS — I509 Heart failure, unspecified: Secondary | ICD-10-CM | POA: Diagnosis not present

## 2018-05-28 DIAGNOSIS — Z8673 Personal history of transient ischemic attack (TIA), and cerebral infarction without residual deficits: Secondary | ICD-10-CM | POA: Diagnosis not present

## 2018-05-28 DIAGNOSIS — E785 Hyperlipidemia, unspecified: Secondary | ICD-10-CM | POA: Diagnosis not present

## 2018-05-28 DIAGNOSIS — I10 Essential (primary) hypertension: Secondary | ICD-10-CM | POA: Diagnosis not present

## 2018-05-28 DIAGNOSIS — J4520 Mild intermittent asthma, uncomplicated: Secondary | ICD-10-CM | POA: Diagnosis not present

## 2018-05-28 DIAGNOSIS — G309 Alzheimer's disease, unspecified: Secondary | ICD-10-CM | POA: Diagnosis not present

## 2018-05-30 DIAGNOSIS — Z8673 Personal history of transient ischemic attack (TIA), and cerebral infarction without residual deficits: Secondary | ICD-10-CM | POA: Diagnosis not present

## 2018-05-30 DIAGNOSIS — G309 Alzheimer's disease, unspecified: Secondary | ICD-10-CM | POA: Diagnosis not present

## 2018-05-30 DIAGNOSIS — E785 Hyperlipidemia, unspecified: Secondary | ICD-10-CM | POA: Diagnosis not present

## 2018-05-30 DIAGNOSIS — J4520 Mild intermittent asthma, uncomplicated: Secondary | ICD-10-CM | POA: Diagnosis not present

## 2018-05-30 DIAGNOSIS — I509 Heart failure, unspecified: Secondary | ICD-10-CM | POA: Diagnosis not present

## 2018-05-30 DIAGNOSIS — I10 Essential (primary) hypertension: Secondary | ICD-10-CM | POA: Diagnosis not present

## 2018-05-31 DIAGNOSIS — F0390 Unspecified dementia without behavioral disturbance: Secondary | ICD-10-CM | POA: Diagnosis not present

## 2018-05-31 DIAGNOSIS — I10 Essential (primary) hypertension: Secondary | ICD-10-CM | POA: Diagnosis not present

## 2018-05-31 DIAGNOSIS — L02415 Cutaneous abscess of right lower limb: Secondary | ICD-10-CM | POA: Diagnosis not present

## 2018-05-31 DIAGNOSIS — I5032 Chronic diastolic (congestive) heart failure: Secondary | ICD-10-CM | POA: Diagnosis not present

## 2018-06-01 DIAGNOSIS — I509 Heart failure, unspecified: Secondary | ICD-10-CM | POA: Diagnosis not present

## 2018-06-01 DIAGNOSIS — Z8673 Personal history of transient ischemic attack (TIA), and cerebral infarction without residual deficits: Secondary | ICD-10-CM | POA: Diagnosis not present

## 2018-06-01 DIAGNOSIS — I10 Essential (primary) hypertension: Secondary | ICD-10-CM | POA: Diagnosis not present

## 2018-06-01 DIAGNOSIS — G309 Alzheimer's disease, unspecified: Secondary | ICD-10-CM | POA: Diagnosis not present

## 2018-06-01 DIAGNOSIS — J4520 Mild intermittent asthma, uncomplicated: Secondary | ICD-10-CM | POA: Diagnosis not present

## 2018-06-01 DIAGNOSIS — E785 Hyperlipidemia, unspecified: Secondary | ICD-10-CM | POA: Diagnosis not present

## 2018-06-04 DIAGNOSIS — G309 Alzheimer's disease, unspecified: Secondary | ICD-10-CM | POA: Diagnosis not present

## 2018-06-04 DIAGNOSIS — I509 Heart failure, unspecified: Secondary | ICD-10-CM | POA: Diagnosis not present

## 2018-06-04 DIAGNOSIS — Z8673 Personal history of transient ischemic attack (TIA), and cerebral infarction without residual deficits: Secondary | ICD-10-CM | POA: Diagnosis not present

## 2018-06-04 DIAGNOSIS — J4520 Mild intermittent asthma, uncomplicated: Secondary | ICD-10-CM | POA: Diagnosis not present

## 2018-06-04 DIAGNOSIS — E785 Hyperlipidemia, unspecified: Secondary | ICD-10-CM | POA: Diagnosis not present

## 2018-06-04 DIAGNOSIS — I10 Essential (primary) hypertension: Secondary | ICD-10-CM | POA: Diagnosis not present

## 2018-06-06 DIAGNOSIS — J4520 Mild intermittent asthma, uncomplicated: Secondary | ICD-10-CM | POA: Diagnosis not present

## 2018-06-06 DIAGNOSIS — Z8673 Personal history of transient ischemic attack (TIA), and cerebral infarction without residual deficits: Secondary | ICD-10-CM | POA: Diagnosis not present

## 2018-06-06 DIAGNOSIS — I10 Essential (primary) hypertension: Secondary | ICD-10-CM | POA: Diagnosis not present

## 2018-06-06 DIAGNOSIS — G309 Alzheimer's disease, unspecified: Secondary | ICD-10-CM | POA: Diagnosis not present

## 2018-06-06 DIAGNOSIS — I509 Heart failure, unspecified: Secondary | ICD-10-CM | POA: Diagnosis not present

## 2018-06-06 DIAGNOSIS — E785 Hyperlipidemia, unspecified: Secondary | ICD-10-CM | POA: Diagnosis not present

## 2018-06-08 DIAGNOSIS — J4520 Mild intermittent asthma, uncomplicated: Secondary | ICD-10-CM | POA: Diagnosis not present

## 2018-06-08 DIAGNOSIS — E785 Hyperlipidemia, unspecified: Secondary | ICD-10-CM | POA: Diagnosis not present

## 2018-06-08 DIAGNOSIS — I509 Heart failure, unspecified: Secondary | ICD-10-CM | POA: Diagnosis not present

## 2018-06-08 DIAGNOSIS — G309 Alzheimer's disease, unspecified: Secondary | ICD-10-CM | POA: Diagnosis not present

## 2018-06-08 DIAGNOSIS — Z8673 Personal history of transient ischemic attack (TIA), and cerebral infarction without residual deficits: Secondary | ICD-10-CM | POA: Diagnosis not present

## 2018-06-08 DIAGNOSIS — I10 Essential (primary) hypertension: Secondary | ICD-10-CM | POA: Diagnosis not present

## 2018-06-11 DIAGNOSIS — E785 Hyperlipidemia, unspecified: Secondary | ICD-10-CM | POA: Diagnosis not present

## 2018-06-11 DIAGNOSIS — Z8673 Personal history of transient ischemic attack (TIA), and cerebral infarction without residual deficits: Secondary | ICD-10-CM | POA: Diagnosis not present

## 2018-06-11 DIAGNOSIS — J4520 Mild intermittent asthma, uncomplicated: Secondary | ICD-10-CM | POA: Diagnosis not present

## 2018-06-11 DIAGNOSIS — I509 Heart failure, unspecified: Secondary | ICD-10-CM | POA: Diagnosis not present

## 2018-06-11 DIAGNOSIS — G309 Alzheimer's disease, unspecified: Secondary | ICD-10-CM | POA: Diagnosis not present

## 2018-06-11 DIAGNOSIS — I10 Essential (primary) hypertension: Secondary | ICD-10-CM | POA: Diagnosis not present

## 2018-06-13 DIAGNOSIS — G309 Alzheimer's disease, unspecified: Secondary | ICD-10-CM | POA: Diagnosis not present

## 2018-06-13 DIAGNOSIS — J4520 Mild intermittent asthma, uncomplicated: Secondary | ICD-10-CM | POA: Diagnosis not present

## 2018-06-13 DIAGNOSIS — I10 Essential (primary) hypertension: Secondary | ICD-10-CM | POA: Diagnosis not present

## 2018-06-13 DIAGNOSIS — Z8673 Personal history of transient ischemic attack (TIA), and cerebral infarction without residual deficits: Secondary | ICD-10-CM | POA: Diagnosis not present

## 2018-06-13 DIAGNOSIS — I509 Heart failure, unspecified: Secondary | ICD-10-CM | POA: Diagnosis not present

## 2018-06-13 DIAGNOSIS — E785 Hyperlipidemia, unspecified: Secondary | ICD-10-CM | POA: Diagnosis not present

## 2018-06-15 DIAGNOSIS — I509 Heart failure, unspecified: Secondary | ICD-10-CM | POA: Diagnosis not present

## 2018-06-15 DIAGNOSIS — Z8673 Personal history of transient ischemic attack (TIA), and cerebral infarction without residual deficits: Secondary | ICD-10-CM | POA: Diagnosis not present

## 2018-06-15 DIAGNOSIS — G309 Alzheimer's disease, unspecified: Secondary | ICD-10-CM | POA: Diagnosis not present

## 2018-06-15 DIAGNOSIS — J4520 Mild intermittent asthma, uncomplicated: Secondary | ICD-10-CM | POA: Diagnosis not present

## 2018-06-15 DIAGNOSIS — F419 Anxiety disorder, unspecified: Secondary | ICD-10-CM | POA: Diagnosis not present

## 2018-06-15 DIAGNOSIS — E785 Hyperlipidemia, unspecified: Secondary | ICD-10-CM | POA: Diagnosis not present

## 2018-06-15 DIAGNOSIS — I5032 Chronic diastolic (congestive) heart failure: Secondary | ICD-10-CM | POA: Diagnosis not present

## 2018-06-15 DIAGNOSIS — I10 Essential (primary) hypertension: Secondary | ICD-10-CM | POA: Diagnosis not present

## 2018-06-17 DIAGNOSIS — F411 Generalized anxiety disorder: Secondary | ICD-10-CM | POA: Diagnosis not present

## 2018-06-17 DIAGNOSIS — G309 Alzheimer's disease, unspecified: Secondary | ICD-10-CM | POA: Diagnosis not present

## 2018-06-17 DIAGNOSIS — Z8673 Personal history of transient ischemic attack (TIA), and cerebral infarction without residual deficits: Secondary | ICD-10-CM | POA: Diagnosis not present

## 2018-06-17 DIAGNOSIS — K219 Gastro-esophageal reflux disease without esophagitis: Secondary | ICD-10-CM | POA: Diagnosis not present

## 2018-06-17 DIAGNOSIS — H409 Unspecified glaucoma: Secondary | ICD-10-CM | POA: Diagnosis not present

## 2018-06-17 DIAGNOSIS — J4520 Mild intermittent asthma, uncomplicated: Secondary | ICD-10-CM | POA: Diagnosis not present

## 2018-06-17 DIAGNOSIS — Z86711 Personal history of pulmonary embolism: Secondary | ICD-10-CM | POA: Diagnosis not present

## 2018-06-17 DIAGNOSIS — M1990 Unspecified osteoarthritis, unspecified site: Secondary | ICD-10-CM | POA: Diagnosis not present

## 2018-06-17 DIAGNOSIS — S81001A Unspecified open wound, right knee, initial encounter: Secondary | ICD-10-CM | POA: Diagnosis not present

## 2018-06-17 DIAGNOSIS — I509 Heart failure, unspecified: Secondary | ICD-10-CM | POA: Diagnosis not present

## 2018-06-17 DIAGNOSIS — I10 Essential (primary) hypertension: Secondary | ICD-10-CM | POA: Diagnosis not present

## 2018-06-17 DIAGNOSIS — E785 Hyperlipidemia, unspecified: Secondary | ICD-10-CM | POA: Diagnosis not present

## 2018-06-18 DIAGNOSIS — E785 Hyperlipidemia, unspecified: Secondary | ICD-10-CM | POA: Diagnosis not present

## 2018-06-18 DIAGNOSIS — Z8673 Personal history of transient ischemic attack (TIA), and cerebral infarction without residual deficits: Secondary | ICD-10-CM | POA: Diagnosis not present

## 2018-06-18 DIAGNOSIS — G309 Alzheimer's disease, unspecified: Secondary | ICD-10-CM | POA: Diagnosis not present

## 2018-06-18 DIAGNOSIS — I509 Heart failure, unspecified: Secondary | ICD-10-CM | POA: Diagnosis not present

## 2018-06-18 DIAGNOSIS — I10 Essential (primary) hypertension: Secondary | ICD-10-CM | POA: Diagnosis not present

## 2018-06-18 DIAGNOSIS — J4520 Mild intermittent asthma, uncomplicated: Secondary | ICD-10-CM | POA: Diagnosis not present

## 2018-06-19 DIAGNOSIS — I509 Heart failure, unspecified: Secondary | ICD-10-CM | POA: Diagnosis not present

## 2018-06-19 DIAGNOSIS — Z8673 Personal history of transient ischemic attack (TIA), and cerebral infarction without residual deficits: Secondary | ICD-10-CM | POA: Diagnosis not present

## 2018-06-19 DIAGNOSIS — E785 Hyperlipidemia, unspecified: Secondary | ICD-10-CM | POA: Diagnosis not present

## 2018-06-19 DIAGNOSIS — J4520 Mild intermittent asthma, uncomplicated: Secondary | ICD-10-CM | POA: Diagnosis not present

## 2018-06-19 DIAGNOSIS — I10 Essential (primary) hypertension: Secondary | ICD-10-CM | POA: Diagnosis not present

## 2018-06-19 DIAGNOSIS — G309 Alzheimer's disease, unspecified: Secondary | ICD-10-CM | POA: Diagnosis not present

## 2018-06-20 DIAGNOSIS — E785 Hyperlipidemia, unspecified: Secondary | ICD-10-CM | POA: Diagnosis not present

## 2018-06-20 DIAGNOSIS — J4520 Mild intermittent asthma, uncomplicated: Secondary | ICD-10-CM | POA: Diagnosis not present

## 2018-06-20 DIAGNOSIS — Z8673 Personal history of transient ischemic attack (TIA), and cerebral infarction without residual deficits: Secondary | ICD-10-CM | POA: Diagnosis not present

## 2018-06-20 DIAGNOSIS — I509 Heart failure, unspecified: Secondary | ICD-10-CM | POA: Diagnosis not present

## 2018-06-20 DIAGNOSIS — I10 Essential (primary) hypertension: Secondary | ICD-10-CM | POA: Diagnosis not present

## 2018-06-20 DIAGNOSIS — G309 Alzheimer's disease, unspecified: Secondary | ICD-10-CM | POA: Diagnosis not present

## 2018-06-21 DIAGNOSIS — I509 Heart failure, unspecified: Secondary | ICD-10-CM | POA: Diagnosis not present

## 2018-06-21 DIAGNOSIS — E785 Hyperlipidemia, unspecified: Secondary | ICD-10-CM | POA: Diagnosis not present

## 2018-06-21 DIAGNOSIS — I10 Essential (primary) hypertension: Secondary | ICD-10-CM | POA: Diagnosis not present

## 2018-06-21 DIAGNOSIS — G309 Alzheimer's disease, unspecified: Secondary | ICD-10-CM | POA: Diagnosis not present

## 2018-06-21 DIAGNOSIS — J4520 Mild intermittent asthma, uncomplicated: Secondary | ICD-10-CM | POA: Diagnosis not present

## 2018-06-21 DIAGNOSIS — Z8673 Personal history of transient ischemic attack (TIA), and cerebral infarction without residual deficits: Secondary | ICD-10-CM | POA: Diagnosis not present

## 2018-06-25 DIAGNOSIS — E785 Hyperlipidemia, unspecified: Secondary | ICD-10-CM | POA: Diagnosis not present

## 2018-06-25 DIAGNOSIS — Z8673 Personal history of transient ischemic attack (TIA), and cerebral infarction without residual deficits: Secondary | ICD-10-CM | POA: Diagnosis not present

## 2018-06-25 DIAGNOSIS — G309 Alzheimer's disease, unspecified: Secondary | ICD-10-CM | POA: Diagnosis not present

## 2018-06-25 DIAGNOSIS — I509 Heart failure, unspecified: Secondary | ICD-10-CM | POA: Diagnosis not present

## 2018-06-25 DIAGNOSIS — J4520 Mild intermittent asthma, uncomplicated: Secondary | ICD-10-CM | POA: Diagnosis not present

## 2018-06-25 DIAGNOSIS — I10 Essential (primary) hypertension: Secondary | ICD-10-CM | POA: Diagnosis not present

## 2018-06-27 DIAGNOSIS — G309 Alzheimer's disease, unspecified: Secondary | ICD-10-CM | POA: Diagnosis not present

## 2018-06-27 DIAGNOSIS — E785 Hyperlipidemia, unspecified: Secondary | ICD-10-CM | POA: Diagnosis not present

## 2018-06-27 DIAGNOSIS — Z8673 Personal history of transient ischemic attack (TIA), and cerebral infarction without residual deficits: Secondary | ICD-10-CM | POA: Diagnosis not present

## 2018-06-27 DIAGNOSIS — I509 Heart failure, unspecified: Secondary | ICD-10-CM | POA: Diagnosis not present

## 2018-06-27 DIAGNOSIS — J4520 Mild intermittent asthma, uncomplicated: Secondary | ICD-10-CM | POA: Diagnosis not present

## 2018-06-27 DIAGNOSIS — I10 Essential (primary) hypertension: Secondary | ICD-10-CM | POA: Diagnosis not present

## 2018-06-28 DIAGNOSIS — I509 Heart failure, unspecified: Secondary | ICD-10-CM | POA: Diagnosis not present

## 2018-06-28 DIAGNOSIS — I5032 Chronic diastolic (congestive) heart failure: Secondary | ICD-10-CM | POA: Diagnosis not present

## 2018-06-28 DIAGNOSIS — I10 Essential (primary) hypertension: Secondary | ICD-10-CM | POA: Diagnosis not present

## 2018-06-28 DIAGNOSIS — G309 Alzheimer's disease, unspecified: Secondary | ICD-10-CM | POA: Diagnosis not present

## 2018-06-28 DIAGNOSIS — E785 Hyperlipidemia, unspecified: Secondary | ICD-10-CM | POA: Diagnosis not present

## 2018-06-28 DIAGNOSIS — J4520 Mild intermittent asthma, uncomplicated: Secondary | ICD-10-CM | POA: Diagnosis not present

## 2018-06-28 DIAGNOSIS — M1711 Unilateral primary osteoarthritis, right knee: Secondary | ICD-10-CM | POA: Diagnosis not present

## 2018-06-28 DIAGNOSIS — Z8673 Personal history of transient ischemic attack (TIA), and cerebral infarction without residual deficits: Secondary | ICD-10-CM | POA: Diagnosis not present

## 2018-06-29 DIAGNOSIS — Z8673 Personal history of transient ischemic attack (TIA), and cerebral infarction without residual deficits: Secondary | ICD-10-CM | POA: Diagnosis not present

## 2018-06-29 DIAGNOSIS — G309 Alzheimer's disease, unspecified: Secondary | ICD-10-CM | POA: Diagnosis not present

## 2018-06-29 DIAGNOSIS — J4520 Mild intermittent asthma, uncomplicated: Secondary | ICD-10-CM | POA: Diagnosis not present

## 2018-06-29 DIAGNOSIS — E785 Hyperlipidemia, unspecified: Secondary | ICD-10-CM | POA: Diagnosis not present

## 2018-06-29 DIAGNOSIS — I509 Heart failure, unspecified: Secondary | ICD-10-CM | POA: Diagnosis not present

## 2018-06-29 DIAGNOSIS — I10 Essential (primary) hypertension: Secondary | ICD-10-CM | POA: Diagnosis not present

## 2018-07-02 DIAGNOSIS — J4520 Mild intermittent asthma, uncomplicated: Secondary | ICD-10-CM | POA: Diagnosis not present

## 2018-07-02 DIAGNOSIS — I509 Heart failure, unspecified: Secondary | ICD-10-CM | POA: Diagnosis not present

## 2018-07-02 DIAGNOSIS — Z8673 Personal history of transient ischemic attack (TIA), and cerebral infarction without residual deficits: Secondary | ICD-10-CM | POA: Diagnosis not present

## 2018-07-02 DIAGNOSIS — I10 Essential (primary) hypertension: Secondary | ICD-10-CM | POA: Diagnosis not present

## 2018-07-02 DIAGNOSIS — G309 Alzheimer's disease, unspecified: Secondary | ICD-10-CM | POA: Diagnosis not present

## 2018-07-02 DIAGNOSIS — E785 Hyperlipidemia, unspecified: Secondary | ICD-10-CM | POA: Diagnosis not present

## 2018-07-04 DIAGNOSIS — Z8673 Personal history of transient ischemic attack (TIA), and cerebral infarction without residual deficits: Secondary | ICD-10-CM | POA: Diagnosis not present

## 2018-07-04 DIAGNOSIS — E785 Hyperlipidemia, unspecified: Secondary | ICD-10-CM | POA: Diagnosis not present

## 2018-07-04 DIAGNOSIS — G309 Alzheimer's disease, unspecified: Secondary | ICD-10-CM | POA: Diagnosis not present

## 2018-07-04 DIAGNOSIS — I10 Essential (primary) hypertension: Secondary | ICD-10-CM | POA: Diagnosis not present

## 2018-07-04 DIAGNOSIS — J4520 Mild intermittent asthma, uncomplicated: Secondary | ICD-10-CM | POA: Diagnosis not present

## 2018-07-04 DIAGNOSIS — I509 Heart failure, unspecified: Secondary | ICD-10-CM | POA: Diagnosis not present

## 2018-07-06 DIAGNOSIS — E785 Hyperlipidemia, unspecified: Secondary | ICD-10-CM | POA: Diagnosis not present

## 2018-07-06 DIAGNOSIS — Z8673 Personal history of transient ischemic attack (TIA), and cerebral infarction without residual deficits: Secondary | ICD-10-CM | POA: Diagnosis not present

## 2018-07-06 DIAGNOSIS — J4520 Mild intermittent asthma, uncomplicated: Secondary | ICD-10-CM | POA: Diagnosis not present

## 2018-07-06 DIAGNOSIS — I509 Heart failure, unspecified: Secondary | ICD-10-CM | POA: Diagnosis not present

## 2018-07-06 DIAGNOSIS — G309 Alzheimer's disease, unspecified: Secondary | ICD-10-CM | POA: Diagnosis not present

## 2018-07-06 DIAGNOSIS — I10 Essential (primary) hypertension: Secondary | ICD-10-CM | POA: Diagnosis not present

## 2018-07-09 DIAGNOSIS — I509 Heart failure, unspecified: Secondary | ICD-10-CM | POA: Diagnosis not present

## 2018-07-09 DIAGNOSIS — I10 Essential (primary) hypertension: Secondary | ICD-10-CM | POA: Diagnosis not present

## 2018-07-09 DIAGNOSIS — E785 Hyperlipidemia, unspecified: Secondary | ICD-10-CM | POA: Diagnosis not present

## 2018-07-09 DIAGNOSIS — G309 Alzheimer's disease, unspecified: Secondary | ICD-10-CM | POA: Diagnosis not present

## 2018-07-09 DIAGNOSIS — J4520 Mild intermittent asthma, uncomplicated: Secondary | ICD-10-CM | POA: Diagnosis not present

## 2018-07-09 DIAGNOSIS — Z8673 Personal history of transient ischemic attack (TIA), and cerebral infarction without residual deficits: Secondary | ICD-10-CM | POA: Diagnosis not present

## 2018-07-11 DIAGNOSIS — J4520 Mild intermittent asthma, uncomplicated: Secondary | ICD-10-CM | POA: Diagnosis not present

## 2018-07-11 DIAGNOSIS — Z8673 Personal history of transient ischemic attack (TIA), and cerebral infarction without residual deficits: Secondary | ICD-10-CM | POA: Diagnosis not present

## 2018-07-11 DIAGNOSIS — G309 Alzheimer's disease, unspecified: Secondary | ICD-10-CM | POA: Diagnosis not present

## 2018-07-11 DIAGNOSIS — E785 Hyperlipidemia, unspecified: Secondary | ICD-10-CM | POA: Diagnosis not present

## 2018-07-11 DIAGNOSIS — I10 Essential (primary) hypertension: Secondary | ICD-10-CM | POA: Diagnosis not present

## 2018-07-11 DIAGNOSIS — I509 Heart failure, unspecified: Secondary | ICD-10-CM | POA: Diagnosis not present

## 2018-07-12 DIAGNOSIS — Z8673 Personal history of transient ischemic attack (TIA), and cerebral infarction without residual deficits: Secondary | ICD-10-CM | POA: Diagnosis not present

## 2018-07-12 DIAGNOSIS — G309 Alzheimer's disease, unspecified: Secondary | ICD-10-CM | POA: Diagnosis not present

## 2018-07-12 DIAGNOSIS — I509 Heart failure, unspecified: Secondary | ICD-10-CM | POA: Diagnosis not present

## 2018-07-12 DIAGNOSIS — J4520 Mild intermittent asthma, uncomplicated: Secondary | ICD-10-CM | POA: Diagnosis not present

## 2018-07-12 DIAGNOSIS — E785 Hyperlipidemia, unspecified: Secondary | ICD-10-CM | POA: Diagnosis not present

## 2018-07-12 DIAGNOSIS — I10 Essential (primary) hypertension: Secondary | ICD-10-CM | POA: Diagnosis not present

## 2018-07-16 DIAGNOSIS — I509 Heart failure, unspecified: Secondary | ICD-10-CM | POA: Diagnosis not present

## 2018-07-16 DIAGNOSIS — G309 Alzheimer's disease, unspecified: Secondary | ICD-10-CM | POA: Diagnosis not present

## 2018-07-16 DIAGNOSIS — E785 Hyperlipidemia, unspecified: Secondary | ICD-10-CM | POA: Diagnosis not present

## 2018-07-16 DIAGNOSIS — J4520 Mild intermittent asthma, uncomplicated: Secondary | ICD-10-CM | POA: Diagnosis not present

## 2018-07-16 DIAGNOSIS — Z8673 Personal history of transient ischemic attack (TIA), and cerebral infarction without residual deficits: Secondary | ICD-10-CM | POA: Diagnosis not present

## 2018-07-16 DIAGNOSIS — I10 Essential (primary) hypertension: Secondary | ICD-10-CM | POA: Diagnosis not present

## 2018-07-17 DIAGNOSIS — I5032 Chronic diastolic (congestive) heart failure: Secondary | ICD-10-CM | POA: Diagnosis not present

## 2018-07-17 DIAGNOSIS — I10 Essential (primary) hypertension: Secondary | ICD-10-CM | POA: Diagnosis not present

## 2018-07-17 DIAGNOSIS — G309 Alzheimer's disease, unspecified: Secondary | ICD-10-CM | POA: Diagnosis not present

## 2018-07-17 DIAGNOSIS — M1711 Unilateral primary osteoarthritis, right knee: Secondary | ICD-10-CM | POA: Diagnosis not present

## 2018-07-18 DIAGNOSIS — F411 Generalized anxiety disorder: Secondary | ICD-10-CM | POA: Diagnosis not present

## 2018-07-18 DIAGNOSIS — E785 Hyperlipidemia, unspecified: Secondary | ICD-10-CM | POA: Diagnosis not present

## 2018-07-18 DIAGNOSIS — M1990 Unspecified osteoarthritis, unspecified site: Secondary | ICD-10-CM | POA: Diagnosis not present

## 2018-07-18 DIAGNOSIS — I110 Hypertensive heart disease with heart failure: Secondary | ICD-10-CM | POA: Diagnosis not present

## 2018-07-18 DIAGNOSIS — I509 Heart failure, unspecified: Secondary | ICD-10-CM | POA: Diagnosis not present

## 2018-07-18 DIAGNOSIS — S81001D Unspecified open wound, right knee, subsequent encounter: Secondary | ICD-10-CM | POA: Diagnosis not present

## 2018-07-18 DIAGNOSIS — Z86711 Personal history of pulmonary embolism: Secondary | ICD-10-CM | POA: Diagnosis not present

## 2018-07-18 DIAGNOSIS — H409 Unspecified glaucoma: Secondary | ICD-10-CM | POA: Diagnosis not present

## 2018-07-18 DIAGNOSIS — K219 Gastro-esophageal reflux disease without esophagitis: Secondary | ICD-10-CM | POA: Diagnosis not present

## 2018-07-18 DIAGNOSIS — G309 Alzheimer's disease, unspecified: Secondary | ICD-10-CM | POA: Diagnosis not present

## 2018-07-18 DIAGNOSIS — Z8673 Personal history of transient ischemic attack (TIA), and cerebral infarction without residual deficits: Secondary | ICD-10-CM | POA: Diagnosis not present

## 2018-07-18 DIAGNOSIS — J4520 Mild intermittent asthma, uncomplicated: Secondary | ICD-10-CM | POA: Diagnosis not present

## 2018-07-18 DIAGNOSIS — F0280 Dementia in other diseases classified elsewhere without behavioral disturbance: Secondary | ICD-10-CM | POA: Diagnosis not present

## 2018-07-26 DIAGNOSIS — M1711 Unilateral primary osteoarthritis, right knee: Secondary | ICD-10-CM | POA: Diagnosis not present

## 2018-07-26 DIAGNOSIS — I5032 Chronic diastolic (congestive) heart failure: Secondary | ICD-10-CM | POA: Diagnosis not present

## 2018-07-26 DIAGNOSIS — I10 Essential (primary) hypertension: Secondary | ICD-10-CM | POA: Diagnosis not present

## 2018-07-26 DIAGNOSIS — I2699 Other pulmonary embolism without acute cor pulmonale: Secondary | ICD-10-CM | POA: Diagnosis not present

## 2018-07-27 DIAGNOSIS — E785 Hyperlipidemia, unspecified: Secondary | ICD-10-CM | POA: Diagnosis not present

## 2018-07-27 DIAGNOSIS — I509 Heart failure, unspecified: Secondary | ICD-10-CM | POA: Diagnosis not present

## 2018-07-27 DIAGNOSIS — J4520 Mild intermittent asthma, uncomplicated: Secondary | ICD-10-CM | POA: Diagnosis not present

## 2018-07-27 DIAGNOSIS — I110 Hypertensive heart disease with heart failure: Secondary | ICD-10-CM | POA: Diagnosis not present

## 2018-07-27 DIAGNOSIS — G309 Alzheimer's disease, unspecified: Secondary | ICD-10-CM | POA: Diagnosis not present

## 2018-07-27 DIAGNOSIS — F0280 Dementia in other diseases classified elsewhere without behavioral disturbance: Secondary | ICD-10-CM | POA: Diagnosis not present

## 2018-08-10 DIAGNOSIS — M1711 Unilateral primary osteoarthritis, right knee: Secondary | ICD-10-CM | POA: Diagnosis not present

## 2018-08-10 DIAGNOSIS — I5032 Chronic diastolic (congestive) heart failure: Secondary | ICD-10-CM | POA: Diagnosis not present

## 2018-08-10 DIAGNOSIS — I2699 Other pulmonary embolism without acute cor pulmonale: Secondary | ICD-10-CM | POA: Diagnosis not present

## 2018-08-10 DIAGNOSIS — L03115 Cellulitis of right lower limb: Secondary | ICD-10-CM | POA: Diagnosis not present

## 2018-08-17 DIAGNOSIS — G309 Alzheimer's disease, unspecified: Secondary | ICD-10-CM | POA: Diagnosis not present

## 2018-08-17 DIAGNOSIS — I509 Heart failure, unspecified: Secondary | ICD-10-CM | POA: Diagnosis not present

## 2018-08-17 DIAGNOSIS — E785 Hyperlipidemia, unspecified: Secondary | ICD-10-CM | POA: Diagnosis not present

## 2018-08-17 DIAGNOSIS — F411 Generalized anxiety disorder: Secondary | ICD-10-CM | POA: Diagnosis not present

## 2018-08-17 DIAGNOSIS — M1990 Unspecified osteoarthritis, unspecified site: Secondary | ICD-10-CM | POA: Diagnosis not present

## 2018-08-17 DIAGNOSIS — Z8673 Personal history of transient ischemic attack (TIA), and cerebral infarction without residual deficits: Secondary | ICD-10-CM | POA: Diagnosis not present

## 2018-08-17 DIAGNOSIS — I110 Hypertensive heart disease with heart failure: Secondary | ICD-10-CM | POA: Diagnosis not present

## 2018-08-17 DIAGNOSIS — H409 Unspecified glaucoma: Secondary | ICD-10-CM | POA: Diagnosis not present

## 2018-08-17 DIAGNOSIS — Z86711 Personal history of pulmonary embolism: Secondary | ICD-10-CM | POA: Diagnosis not present

## 2018-08-17 DIAGNOSIS — F0280 Dementia in other diseases classified elsewhere without behavioral disturbance: Secondary | ICD-10-CM | POA: Diagnosis not present

## 2018-08-17 DIAGNOSIS — K219 Gastro-esophageal reflux disease without esophagitis: Secondary | ICD-10-CM | POA: Diagnosis not present

## 2018-08-17 DIAGNOSIS — J4520 Mild intermittent asthma, uncomplicated: Secondary | ICD-10-CM | POA: Diagnosis not present

## 2018-08-17 DIAGNOSIS — S81001D Unspecified open wound, right knee, subsequent encounter: Secondary | ICD-10-CM | POA: Diagnosis not present

## 2018-08-20 DIAGNOSIS — J4520 Mild intermittent asthma, uncomplicated: Secondary | ICD-10-CM | POA: Diagnosis not present

## 2018-08-20 DIAGNOSIS — E785 Hyperlipidemia, unspecified: Secondary | ICD-10-CM | POA: Diagnosis not present

## 2018-08-20 DIAGNOSIS — I110 Hypertensive heart disease with heart failure: Secondary | ICD-10-CM | POA: Diagnosis not present

## 2018-08-20 DIAGNOSIS — G309 Alzheimer's disease, unspecified: Secondary | ICD-10-CM | POA: Diagnosis not present

## 2018-08-20 DIAGNOSIS — I509 Heart failure, unspecified: Secondary | ICD-10-CM | POA: Diagnosis not present

## 2018-08-20 DIAGNOSIS — F0280 Dementia in other diseases classified elsewhere without behavioral disturbance: Secondary | ICD-10-CM | POA: Diagnosis not present

## 2018-08-23 DIAGNOSIS — I5032 Chronic diastolic (congestive) heart failure: Secondary | ICD-10-CM | POA: Diagnosis not present

## 2018-08-23 DIAGNOSIS — M1711 Unilateral primary osteoarthritis, right knee: Secondary | ICD-10-CM | POA: Diagnosis not present

## 2018-08-23 DIAGNOSIS — L03115 Cellulitis of right lower limb: Secondary | ICD-10-CM | POA: Diagnosis not present

## 2018-08-23 DIAGNOSIS — I2699 Other pulmonary embolism without acute cor pulmonale: Secondary | ICD-10-CM | POA: Diagnosis not present

## 2018-09-04 DIAGNOSIS — I5032 Chronic diastolic (congestive) heart failure: Secondary | ICD-10-CM | POA: Diagnosis not present

## 2018-09-04 DIAGNOSIS — I2699 Other pulmonary embolism without acute cor pulmonale: Secondary | ICD-10-CM | POA: Diagnosis not present

## 2018-09-04 DIAGNOSIS — I10 Essential (primary) hypertension: Secondary | ICD-10-CM | POA: Diagnosis not present

## 2018-09-04 DIAGNOSIS — M1711 Unilateral primary osteoarthritis, right knee: Secondary | ICD-10-CM | POA: Diagnosis not present

## 2018-09-05 DIAGNOSIS — I509 Heart failure, unspecified: Secondary | ICD-10-CM | POA: Diagnosis not present

## 2018-09-05 DIAGNOSIS — G309 Alzheimer's disease, unspecified: Secondary | ICD-10-CM | POA: Diagnosis not present

## 2018-09-05 DIAGNOSIS — F0280 Dementia in other diseases classified elsewhere without behavioral disturbance: Secondary | ICD-10-CM | POA: Diagnosis not present

## 2018-09-05 DIAGNOSIS — J4520 Mild intermittent asthma, uncomplicated: Secondary | ICD-10-CM | POA: Diagnosis not present

## 2018-09-05 DIAGNOSIS — I110 Hypertensive heart disease with heart failure: Secondary | ICD-10-CM | POA: Diagnosis not present

## 2018-09-05 DIAGNOSIS — E785 Hyperlipidemia, unspecified: Secondary | ICD-10-CM | POA: Diagnosis not present

## 2018-09-06 DIAGNOSIS — G309 Alzheimer's disease, unspecified: Secondary | ICD-10-CM | POA: Diagnosis not present

## 2018-09-06 DIAGNOSIS — I509 Heart failure, unspecified: Secondary | ICD-10-CM | POA: Diagnosis not present

## 2018-09-06 DIAGNOSIS — F0280 Dementia in other diseases classified elsewhere without behavioral disturbance: Secondary | ICD-10-CM | POA: Diagnosis not present

## 2018-09-06 DIAGNOSIS — J4520 Mild intermittent asthma, uncomplicated: Secondary | ICD-10-CM | POA: Diagnosis not present

## 2018-09-06 DIAGNOSIS — I110 Hypertensive heart disease with heart failure: Secondary | ICD-10-CM | POA: Diagnosis not present

## 2018-09-06 DIAGNOSIS — E785 Hyperlipidemia, unspecified: Secondary | ICD-10-CM | POA: Diagnosis not present

## 2018-09-17 DIAGNOSIS — S81001D Unspecified open wound, right knee, subsequent encounter: Secondary | ICD-10-CM | POA: Diagnosis not present

## 2018-09-17 DIAGNOSIS — I110 Hypertensive heart disease with heart failure: Secondary | ICD-10-CM | POA: Diagnosis not present

## 2018-09-17 DIAGNOSIS — H409 Unspecified glaucoma: Secondary | ICD-10-CM | POA: Diagnosis not present

## 2018-09-17 DIAGNOSIS — F0280 Dementia in other diseases classified elsewhere without behavioral disturbance: Secondary | ICD-10-CM | POA: Diagnosis not present

## 2018-09-17 DIAGNOSIS — E785 Hyperlipidemia, unspecified: Secondary | ICD-10-CM | POA: Diagnosis not present

## 2018-09-17 DIAGNOSIS — J4520 Mild intermittent asthma, uncomplicated: Secondary | ICD-10-CM | POA: Diagnosis not present

## 2018-09-17 DIAGNOSIS — Z86711 Personal history of pulmonary embolism: Secondary | ICD-10-CM | POA: Diagnosis not present

## 2018-09-17 DIAGNOSIS — G309 Alzheimer's disease, unspecified: Secondary | ICD-10-CM | POA: Diagnosis not present

## 2018-09-17 DIAGNOSIS — Z8673 Personal history of transient ischemic attack (TIA), and cerebral infarction without residual deficits: Secondary | ICD-10-CM | POA: Diagnosis not present

## 2018-09-17 DIAGNOSIS — M1990 Unspecified osteoarthritis, unspecified site: Secondary | ICD-10-CM | POA: Diagnosis not present

## 2018-09-17 DIAGNOSIS — F411 Generalized anxiety disorder: Secondary | ICD-10-CM | POA: Diagnosis not present

## 2018-09-17 DIAGNOSIS — I509 Heart failure, unspecified: Secondary | ICD-10-CM | POA: Diagnosis not present

## 2018-09-17 DIAGNOSIS — K219 Gastro-esophageal reflux disease without esophagitis: Secondary | ICD-10-CM | POA: Diagnosis not present

## 2018-09-26 DIAGNOSIS — I110 Hypertensive heart disease with heart failure: Secondary | ICD-10-CM | POA: Diagnosis not present

## 2018-09-26 DIAGNOSIS — F0280 Dementia in other diseases classified elsewhere without behavioral disturbance: Secondary | ICD-10-CM | POA: Diagnosis not present

## 2018-09-26 DIAGNOSIS — J4520 Mild intermittent asthma, uncomplicated: Secondary | ICD-10-CM | POA: Diagnosis not present

## 2018-09-26 DIAGNOSIS — E785 Hyperlipidemia, unspecified: Secondary | ICD-10-CM | POA: Diagnosis not present

## 2018-09-26 DIAGNOSIS — G309 Alzheimer's disease, unspecified: Secondary | ICD-10-CM | POA: Diagnosis not present

## 2018-09-26 DIAGNOSIS — I509 Heart failure, unspecified: Secondary | ICD-10-CM | POA: Diagnosis not present

## 2018-10-02 DIAGNOSIS — M1711 Unilateral primary osteoarthritis, right knee: Secondary | ICD-10-CM | POA: Diagnosis not present

## 2018-10-02 DIAGNOSIS — I5032 Chronic diastolic (congestive) heart failure: Secondary | ICD-10-CM | POA: Diagnosis not present

## 2018-10-02 DIAGNOSIS — I2699 Other pulmonary embolism without acute cor pulmonale: Secondary | ICD-10-CM | POA: Diagnosis not present

## 2018-10-02 DIAGNOSIS — I10 Essential (primary) hypertension: Secondary | ICD-10-CM | POA: Diagnosis not present

## 2018-10-05 DIAGNOSIS — I110 Hypertensive heart disease with heart failure: Secondary | ICD-10-CM | POA: Diagnosis not present

## 2018-10-05 DIAGNOSIS — E785 Hyperlipidemia, unspecified: Secondary | ICD-10-CM | POA: Diagnosis not present

## 2018-10-05 DIAGNOSIS — G309 Alzheimer's disease, unspecified: Secondary | ICD-10-CM | POA: Diagnosis not present

## 2018-10-05 DIAGNOSIS — F0280 Dementia in other diseases classified elsewhere without behavioral disturbance: Secondary | ICD-10-CM | POA: Diagnosis not present

## 2018-10-05 DIAGNOSIS — J4520 Mild intermittent asthma, uncomplicated: Secondary | ICD-10-CM | POA: Diagnosis not present

## 2018-10-05 DIAGNOSIS — I509 Heart failure, unspecified: Secondary | ICD-10-CM | POA: Diagnosis not present

## 2018-10-17 DIAGNOSIS — Z8673 Personal history of transient ischemic attack (TIA), and cerebral infarction without residual deficits: Secondary | ICD-10-CM | POA: Diagnosis not present

## 2018-10-17 DIAGNOSIS — F0280 Dementia in other diseases classified elsewhere without behavioral disturbance: Secondary | ICD-10-CM | POA: Diagnosis not present

## 2018-10-17 DIAGNOSIS — J4520 Mild intermittent asthma, uncomplicated: Secondary | ICD-10-CM | POA: Diagnosis not present

## 2018-10-17 DIAGNOSIS — G309 Alzheimer's disease, unspecified: Secondary | ICD-10-CM | POA: Diagnosis not present

## 2018-10-17 DIAGNOSIS — F411 Generalized anxiety disorder: Secondary | ICD-10-CM | POA: Diagnosis not present

## 2018-10-17 DIAGNOSIS — H409 Unspecified glaucoma: Secondary | ICD-10-CM | POA: Diagnosis not present

## 2018-10-17 DIAGNOSIS — M1990 Unspecified osteoarthritis, unspecified site: Secondary | ICD-10-CM | POA: Diagnosis not present

## 2018-10-17 DIAGNOSIS — Z6831 Body mass index (BMI) 31.0-31.9, adult: Secondary | ICD-10-CM | POA: Diagnosis not present

## 2018-10-17 DIAGNOSIS — S81001D Unspecified open wound, right knee, subsequent encounter: Secondary | ICD-10-CM | POA: Diagnosis not present

## 2018-10-17 DIAGNOSIS — K219 Gastro-esophageal reflux disease without esophagitis: Secondary | ICD-10-CM | POA: Diagnosis not present

## 2018-10-17 DIAGNOSIS — Z86711 Personal history of pulmonary embolism: Secondary | ICD-10-CM | POA: Diagnosis not present

## 2018-10-17 DIAGNOSIS — N390 Urinary tract infection, site not specified: Secondary | ICD-10-CM | POA: Diagnosis not present

## 2018-10-17 DIAGNOSIS — I110 Hypertensive heart disease with heart failure: Secondary | ICD-10-CM | POA: Diagnosis not present

## 2018-10-17 DIAGNOSIS — Z79899 Other long term (current) drug therapy: Secondary | ICD-10-CM | POA: Diagnosis not present

## 2018-10-17 DIAGNOSIS — E785 Hyperlipidemia, unspecified: Secondary | ICD-10-CM | POA: Diagnosis not present

## 2018-10-17 DIAGNOSIS — I509 Heart failure, unspecified: Secondary | ICD-10-CM | POA: Diagnosis not present

## 2018-10-17 DIAGNOSIS — R319 Hematuria, unspecified: Secondary | ICD-10-CM | POA: Diagnosis not present

## 2018-10-19 DIAGNOSIS — J4520 Mild intermittent asthma, uncomplicated: Secondary | ICD-10-CM | POA: Diagnosis not present

## 2018-10-19 DIAGNOSIS — F0280 Dementia in other diseases classified elsewhere without behavioral disturbance: Secondary | ICD-10-CM | POA: Diagnosis not present

## 2018-10-19 DIAGNOSIS — E785 Hyperlipidemia, unspecified: Secondary | ICD-10-CM | POA: Diagnosis not present

## 2018-10-19 DIAGNOSIS — G309 Alzheimer's disease, unspecified: Secondary | ICD-10-CM | POA: Diagnosis not present

## 2018-10-19 DIAGNOSIS — I110 Hypertensive heart disease with heart failure: Secondary | ICD-10-CM | POA: Diagnosis not present

## 2018-10-19 DIAGNOSIS — I509 Heart failure, unspecified: Secondary | ICD-10-CM | POA: Diagnosis not present

## 2018-10-25 DIAGNOSIS — I10 Essential (primary) hypertension: Secondary | ICD-10-CM | POA: Diagnosis not present

## 2018-10-25 DIAGNOSIS — J4520 Mild intermittent asthma, uncomplicated: Secondary | ICD-10-CM | POA: Diagnosis not present

## 2018-10-25 DIAGNOSIS — I5032 Chronic diastolic (congestive) heart failure: Secondary | ICD-10-CM | POA: Diagnosis not present

## 2018-10-25 DIAGNOSIS — E785 Hyperlipidemia, unspecified: Secondary | ICD-10-CM | POA: Diagnosis not present

## 2018-10-25 DIAGNOSIS — G309 Alzheimer's disease, unspecified: Secondary | ICD-10-CM | POA: Diagnosis not present

## 2018-10-25 DIAGNOSIS — F0280 Dementia in other diseases classified elsewhere without behavioral disturbance: Secondary | ICD-10-CM | POA: Diagnosis not present

## 2018-10-25 DIAGNOSIS — I110 Hypertensive heart disease with heart failure: Secondary | ICD-10-CM | POA: Diagnosis not present

## 2018-10-25 DIAGNOSIS — I2699 Other pulmonary embolism without acute cor pulmonale: Secondary | ICD-10-CM | POA: Diagnosis not present

## 2018-10-25 DIAGNOSIS — M1711 Unilateral primary osteoarthritis, right knee: Secondary | ICD-10-CM | POA: Diagnosis not present

## 2018-10-25 DIAGNOSIS — I509 Heart failure, unspecified: Secondary | ICD-10-CM | POA: Diagnosis not present

## 2018-10-29 DIAGNOSIS — M1711 Unilateral primary osteoarthritis, right knee: Secondary | ICD-10-CM | POA: Diagnosis not present

## 2018-10-29 DIAGNOSIS — I5032 Chronic diastolic (congestive) heart failure: Secondary | ICD-10-CM | POA: Diagnosis not present

## 2018-10-29 DIAGNOSIS — I2699 Other pulmonary embolism without acute cor pulmonale: Secondary | ICD-10-CM | POA: Diagnosis not present

## 2018-10-29 DIAGNOSIS — I10 Essential (primary) hypertension: Secondary | ICD-10-CM | POA: Diagnosis not present

## 2018-10-30 DIAGNOSIS — J4520 Mild intermittent asthma, uncomplicated: Secondary | ICD-10-CM | POA: Diagnosis not present

## 2018-10-30 DIAGNOSIS — G309 Alzheimer's disease, unspecified: Secondary | ICD-10-CM | POA: Diagnosis not present

## 2018-10-30 DIAGNOSIS — I110 Hypertensive heart disease with heart failure: Secondary | ICD-10-CM | POA: Diagnosis not present

## 2018-10-30 DIAGNOSIS — F0280 Dementia in other diseases classified elsewhere without behavioral disturbance: Secondary | ICD-10-CM | POA: Diagnosis not present

## 2018-10-30 DIAGNOSIS — E785 Hyperlipidemia, unspecified: Secondary | ICD-10-CM | POA: Diagnosis not present

## 2018-10-30 DIAGNOSIS — I509 Heart failure, unspecified: Secondary | ICD-10-CM | POA: Diagnosis not present

## 2018-11-05 DIAGNOSIS — E785 Hyperlipidemia, unspecified: Secondary | ICD-10-CM | POA: Diagnosis not present

## 2018-11-05 DIAGNOSIS — I509 Heart failure, unspecified: Secondary | ICD-10-CM | POA: Diagnosis not present

## 2018-11-05 DIAGNOSIS — F0280 Dementia in other diseases classified elsewhere without behavioral disturbance: Secondary | ICD-10-CM | POA: Diagnosis not present

## 2018-11-05 DIAGNOSIS — G309 Alzheimer's disease, unspecified: Secondary | ICD-10-CM | POA: Diagnosis not present

## 2018-11-05 DIAGNOSIS — J4520 Mild intermittent asthma, uncomplicated: Secondary | ICD-10-CM | POA: Diagnosis not present

## 2018-11-05 DIAGNOSIS — I110 Hypertensive heart disease with heart failure: Secondary | ICD-10-CM | POA: Diagnosis not present

## 2018-11-09 DIAGNOSIS — R278 Other lack of coordination: Secondary | ICD-10-CM | POA: Diagnosis not present

## 2018-11-12 DIAGNOSIS — R278 Other lack of coordination: Secondary | ICD-10-CM | POA: Diagnosis not present

## 2018-11-13 DIAGNOSIS — R278 Other lack of coordination: Secondary | ICD-10-CM | POA: Diagnosis not present

## 2018-11-14 DIAGNOSIS — R278 Other lack of coordination: Secondary | ICD-10-CM | POA: Diagnosis not present

## 2018-11-15 DIAGNOSIS — R278 Other lack of coordination: Secondary | ICD-10-CM | POA: Diagnosis not present

## 2018-11-16 DIAGNOSIS — F329 Major depressive disorder, single episode, unspecified: Secondary | ICD-10-CM | POA: Diagnosis not present

## 2018-11-16 DIAGNOSIS — R278 Other lack of coordination: Secondary | ICD-10-CM | POA: Diagnosis not present

## 2018-11-16 DIAGNOSIS — I5032 Chronic diastolic (congestive) heart failure: Secondary | ICD-10-CM | POA: Diagnosis not present

## 2018-11-16 DIAGNOSIS — E785 Hyperlipidemia, unspecified: Secondary | ICD-10-CM | POA: Diagnosis not present

## 2018-11-16 DIAGNOSIS — I10 Essential (primary) hypertension: Secondary | ICD-10-CM | POA: Diagnosis not present

## 2018-11-19 DIAGNOSIS — R278 Other lack of coordination: Secondary | ICD-10-CM | POA: Diagnosis not present

## 2018-11-20 DIAGNOSIS — R278 Other lack of coordination: Secondary | ICD-10-CM | POA: Diagnosis not present

## 2018-11-21 DIAGNOSIS — R278 Other lack of coordination: Secondary | ICD-10-CM | POA: Diagnosis not present

## 2018-11-22 DIAGNOSIS — I10 Essential (primary) hypertension: Secondary | ICD-10-CM | POA: Diagnosis not present

## 2018-11-22 DIAGNOSIS — M1711 Unilateral primary osteoarthritis, right knee: Secondary | ICD-10-CM | POA: Diagnosis not present

## 2018-11-22 DIAGNOSIS — I2699 Other pulmonary embolism without acute cor pulmonale: Secondary | ICD-10-CM | POA: Diagnosis not present

## 2018-11-22 DIAGNOSIS — I5032 Chronic diastolic (congestive) heart failure: Secondary | ICD-10-CM | POA: Diagnosis not present

## 2018-11-22 DIAGNOSIS — R278 Other lack of coordination: Secondary | ICD-10-CM | POA: Diagnosis not present

## 2018-11-23 DIAGNOSIS — R319 Hematuria, unspecified: Secondary | ICD-10-CM | POA: Diagnosis not present

## 2018-11-23 DIAGNOSIS — N390 Urinary tract infection, site not specified: Secondary | ICD-10-CM | POA: Diagnosis not present

## 2018-11-23 DIAGNOSIS — R278 Other lack of coordination: Secondary | ICD-10-CM | POA: Diagnosis not present

## 2018-11-28 DIAGNOSIS — I2699 Other pulmonary embolism without acute cor pulmonale: Secondary | ICD-10-CM | POA: Diagnosis not present

## 2018-11-28 DIAGNOSIS — M1711 Unilateral primary osteoarthritis, right knee: Secondary | ICD-10-CM | POA: Diagnosis not present

## 2018-11-28 DIAGNOSIS — I10 Essential (primary) hypertension: Secondary | ICD-10-CM | POA: Diagnosis not present

## 2018-11-28 DIAGNOSIS — I5032 Chronic diastolic (congestive) heart failure: Secondary | ICD-10-CM | POA: Diagnosis not present

## 2018-12-17 DIAGNOSIS — F329 Major depressive disorder, single episode, unspecified: Secondary | ICD-10-CM | POA: Diagnosis not present

## 2018-12-17 DIAGNOSIS — I5032 Chronic diastolic (congestive) heart failure: Secondary | ICD-10-CM | POA: Diagnosis not present

## 2018-12-17 DIAGNOSIS — E785 Hyperlipidemia, unspecified: Secondary | ICD-10-CM | POA: Diagnosis not present

## 2018-12-17 DIAGNOSIS — I10 Essential (primary) hypertension: Secondary | ICD-10-CM | POA: Diagnosis not present

## 2018-12-20 DIAGNOSIS — M6281 Muscle weakness (generalized): Secondary | ICD-10-CM | POA: Diagnosis not present

## 2018-12-20 DIAGNOSIS — M1711 Unilateral primary osteoarthritis, right knee: Secondary | ICD-10-CM | POA: Diagnosis not present

## 2018-12-20 DIAGNOSIS — I2699 Other pulmonary embolism without acute cor pulmonale: Secondary | ICD-10-CM | POA: Diagnosis not present

## 2018-12-20 DIAGNOSIS — I5032 Chronic diastolic (congestive) heart failure: Secondary | ICD-10-CM | POA: Diagnosis not present

## 2018-12-26 DIAGNOSIS — I5032 Chronic diastolic (congestive) heart failure: Secondary | ICD-10-CM | POA: Diagnosis not present

## 2018-12-26 DIAGNOSIS — M6281 Muscle weakness (generalized): Secondary | ICD-10-CM | POA: Diagnosis not present

## 2018-12-26 DIAGNOSIS — M1711 Unilateral primary osteoarthritis, right knee: Secondary | ICD-10-CM | POA: Diagnosis not present

## 2018-12-26 DIAGNOSIS — I2699 Other pulmonary embolism without acute cor pulmonale: Secondary | ICD-10-CM | POA: Diagnosis not present

## 2019-01-07 DIAGNOSIS — U071 COVID-19: Secondary | ICD-10-CM | POA: Diagnosis not present

## 2019-01-15 DIAGNOSIS — F329 Major depressive disorder, single episode, unspecified: Secondary | ICD-10-CM | POA: Diagnosis not present

## 2019-01-15 DIAGNOSIS — I5032 Chronic diastolic (congestive) heart failure: Secondary | ICD-10-CM | POA: Diagnosis not present

## 2019-01-15 DIAGNOSIS — E785 Hyperlipidemia, unspecified: Secondary | ICD-10-CM | POA: Diagnosis not present

## 2019-01-15 DIAGNOSIS — I10 Essential (primary) hypertension: Secondary | ICD-10-CM | POA: Diagnosis not present

## 2019-01-20 DIAGNOSIS — R509 Fever, unspecified: Secondary | ICD-10-CM | POA: Diagnosis not present

## 2019-01-23 DIAGNOSIS — J449 Chronic obstructive pulmonary disease, unspecified: Secondary | ICD-10-CM | POA: Diagnosis not present

## 2019-01-23 DIAGNOSIS — I10 Essential (primary) hypertension: Secondary | ICD-10-CM | POA: Diagnosis not present

## 2019-01-23 DIAGNOSIS — G309 Alzheimer's disease, unspecified: Secondary | ICD-10-CM | POA: Diagnosis not present

## 2019-01-23 DIAGNOSIS — I5032 Chronic diastolic (congestive) heart failure: Secondary | ICD-10-CM | POA: Diagnosis not present

## 2019-01-24 DIAGNOSIS — M1711 Unilateral primary osteoarthritis, right knee: Secondary | ICD-10-CM | POA: Diagnosis not present

## 2019-01-24 DIAGNOSIS — I10 Essential (primary) hypertension: Secondary | ICD-10-CM | POA: Diagnosis not present

## 2019-01-24 DIAGNOSIS — I5032 Chronic diastolic (congestive) heart failure: Secondary | ICD-10-CM | POA: Diagnosis not present

## 2019-01-24 DIAGNOSIS — K219 Gastro-esophageal reflux disease without esophagitis: Secondary | ICD-10-CM | POA: Diagnosis not present

## 2019-01-26 DIAGNOSIS — R319 Hematuria, unspecified: Secondary | ICD-10-CM | POA: Diagnosis not present

## 2019-01-26 DIAGNOSIS — N390 Urinary tract infection, site not specified: Secondary | ICD-10-CM | POA: Diagnosis not present

## 2019-01-26 DIAGNOSIS — Z79899 Other long term (current) drug therapy: Secondary | ICD-10-CM | POA: Diagnosis not present

## 2019-02-09 DIAGNOSIS — E039 Hypothyroidism, unspecified: Secondary | ICD-10-CM | POA: Diagnosis not present

## 2019-02-09 DIAGNOSIS — D649 Anemia, unspecified: Secondary | ICD-10-CM | POA: Diagnosis not present

## 2019-02-09 DIAGNOSIS — D519 Vitamin B12 deficiency anemia, unspecified: Secondary | ICD-10-CM | POA: Diagnosis not present

## 2019-02-09 DIAGNOSIS — D509 Iron deficiency anemia, unspecified: Secondary | ICD-10-CM | POA: Diagnosis not present

## 2019-02-10 DIAGNOSIS — R791 Abnormal coagulation profile: Secondary | ICD-10-CM | POA: Diagnosis not present

## 2019-02-11 DIAGNOSIS — I10 Essential (primary) hypertension: Secondary | ICD-10-CM | POA: Diagnosis not present

## 2019-02-11 DIAGNOSIS — D649 Anemia, unspecified: Secondary | ICD-10-CM | POA: Diagnosis not present

## 2019-02-11 DIAGNOSIS — I5032 Chronic diastolic (congestive) heart failure: Secondary | ICD-10-CM | POA: Diagnosis not present

## 2019-02-11 DIAGNOSIS — F419 Anxiety disorder, unspecified: Secondary | ICD-10-CM | POA: Diagnosis not present

## 2019-02-12 DIAGNOSIS — I5032 Chronic diastolic (congestive) heart failure: Secondary | ICD-10-CM | POA: Diagnosis not present

## 2019-02-12 DIAGNOSIS — F329 Major depressive disorder, single episode, unspecified: Secondary | ICD-10-CM | POA: Diagnosis not present

## 2019-02-12 DIAGNOSIS — E785 Hyperlipidemia, unspecified: Secondary | ICD-10-CM | POA: Diagnosis not present

## 2019-02-12 DIAGNOSIS — I10 Essential (primary) hypertension: Secondary | ICD-10-CM | POA: Diagnosis not present

## 2019-02-13 DIAGNOSIS — I10 Essential (primary) hypertension: Secondary | ICD-10-CM | POA: Diagnosis not present

## 2019-02-13 DIAGNOSIS — D649 Anemia, unspecified: Secondary | ICD-10-CM | POA: Diagnosis not present

## 2019-02-13 DIAGNOSIS — I5032 Chronic diastolic (congestive) heart failure: Secondary | ICD-10-CM | POA: Diagnosis not present

## 2019-02-13 DIAGNOSIS — G309 Alzheimer's disease, unspecified: Secondary | ICD-10-CM | POA: Diagnosis not present

## 2019-02-14 DIAGNOSIS — R278 Other lack of coordination: Secondary | ICD-10-CM | POA: Diagnosis not present

## 2019-02-14 DIAGNOSIS — R05 Cough: Secondary | ICD-10-CM | POA: Diagnosis not present

## 2019-02-15 DIAGNOSIS — R278 Other lack of coordination: Secondary | ICD-10-CM | POA: Diagnosis not present

## 2019-02-16 DIAGNOSIS — R278 Other lack of coordination: Secondary | ICD-10-CM | POA: Diagnosis not present

## 2019-02-18 DIAGNOSIS — Z7901 Long term (current) use of anticoagulants: Secondary | ICD-10-CM | POA: Diagnosis not present

## 2019-02-18 DIAGNOSIS — I4891 Unspecified atrial fibrillation: Secondary | ICD-10-CM | POA: Diagnosis not present

## 2019-02-18 DIAGNOSIS — I5032 Chronic diastolic (congestive) heart failure: Secondary | ICD-10-CM | POA: Diagnosis not present

## 2019-02-18 DIAGNOSIS — D649 Anemia, unspecified: Secondary | ICD-10-CM | POA: Diagnosis not present

## 2019-02-18 DIAGNOSIS — R278 Other lack of coordination: Secondary | ICD-10-CM | POA: Diagnosis not present

## 2019-02-19 DIAGNOSIS — U071 COVID-19: Secondary | ICD-10-CM | POA: Diagnosis not present

## 2019-02-19 DIAGNOSIS — R278 Other lack of coordination: Secondary | ICD-10-CM | POA: Diagnosis not present

## 2019-02-21 DIAGNOSIS — I5032 Chronic diastolic (congestive) heart failure: Secondary | ICD-10-CM | POA: Diagnosis not present

## 2019-02-21 DIAGNOSIS — G309 Alzheimer's disease, unspecified: Secondary | ICD-10-CM | POA: Diagnosis not present

## 2019-02-21 DIAGNOSIS — I10 Essential (primary) hypertension: Secondary | ICD-10-CM | POA: Diagnosis not present

## 2019-02-21 DIAGNOSIS — F329 Major depressive disorder, single episode, unspecified: Secondary | ICD-10-CM | POA: Diagnosis not present

## 2019-02-22 DIAGNOSIS — R278 Other lack of coordination: Secondary | ICD-10-CM | POA: Diagnosis not present

## 2019-02-22 DIAGNOSIS — U071 COVID-19: Secondary | ICD-10-CM | POA: Diagnosis not present

## 2019-02-23 DIAGNOSIS — R278 Other lack of coordination: Secondary | ICD-10-CM | POA: Diagnosis not present

## 2019-02-24 DIAGNOSIS — R278 Other lack of coordination: Secondary | ICD-10-CM | POA: Diagnosis not present

## 2019-02-26 DIAGNOSIS — D649 Anemia, unspecified: Secondary | ICD-10-CM | POA: Diagnosis not present

## 2019-02-26 DIAGNOSIS — R278 Other lack of coordination: Secondary | ICD-10-CM | POA: Diagnosis not present

## 2019-02-26 DIAGNOSIS — Z79899 Other long term (current) drug therapy: Secondary | ICD-10-CM | POA: Diagnosis not present

## 2019-02-27 DIAGNOSIS — R278 Other lack of coordination: Secondary | ICD-10-CM | POA: Diagnosis not present

## 2019-02-27 DIAGNOSIS — U071 COVID-19: Secondary | ICD-10-CM | POA: Diagnosis not present

## 2019-02-28 DIAGNOSIS — R278 Other lack of coordination: Secondary | ICD-10-CM | POA: Diagnosis not present

## 2019-03-05 DIAGNOSIS — R278 Other lack of coordination: Secondary | ICD-10-CM | POA: Diagnosis not present

## 2019-03-06 DIAGNOSIS — R278 Other lack of coordination: Secondary | ICD-10-CM | POA: Diagnosis not present

## 2019-03-06 DIAGNOSIS — U071 COVID-19: Secondary | ICD-10-CM | POA: Diagnosis not present

## 2019-03-06 DIAGNOSIS — I5032 Chronic diastolic (congestive) heart failure: Secondary | ICD-10-CM | POA: Diagnosis not present

## 2019-03-06 DIAGNOSIS — G309 Alzheimer's disease, unspecified: Secondary | ICD-10-CM | POA: Diagnosis not present

## 2019-03-06 DIAGNOSIS — D649 Anemia, unspecified: Secondary | ICD-10-CM | POA: Diagnosis not present

## 2019-03-06 DIAGNOSIS — I10 Essential (primary) hypertension: Secondary | ICD-10-CM | POA: Diagnosis not present

## 2019-03-10 DIAGNOSIS — R278 Other lack of coordination: Secondary | ICD-10-CM | POA: Diagnosis not present

## 2019-03-12 DIAGNOSIS — U071 COVID-19: Secondary | ICD-10-CM | POA: Diagnosis not present

## 2019-03-12 DIAGNOSIS — R278 Other lack of coordination: Secondary | ICD-10-CM | POA: Diagnosis not present

## 2019-03-13 DIAGNOSIS — I5032 Chronic diastolic (congestive) heart failure: Secondary | ICD-10-CM | POA: Diagnosis not present

## 2019-03-13 DIAGNOSIS — E785 Hyperlipidemia, unspecified: Secondary | ICD-10-CM | POA: Diagnosis not present

## 2019-03-13 DIAGNOSIS — F329 Major depressive disorder, single episode, unspecified: Secondary | ICD-10-CM | POA: Diagnosis not present

## 2019-03-13 DIAGNOSIS — I10 Essential (primary) hypertension: Secondary | ICD-10-CM | POA: Diagnosis not present

## 2019-03-13 DIAGNOSIS — R278 Other lack of coordination: Secondary | ICD-10-CM | POA: Diagnosis not present

## 2019-03-16 DIAGNOSIS — R278 Other lack of coordination: Secondary | ICD-10-CM | POA: Diagnosis not present

## 2019-03-19 DIAGNOSIS — U071 COVID-19: Secondary | ICD-10-CM | POA: Diagnosis not present

## 2019-03-19 DIAGNOSIS — Z7901 Long term (current) use of anticoagulants: Secondary | ICD-10-CM | POA: Diagnosis not present

## 2019-03-19 DIAGNOSIS — I4891 Unspecified atrial fibrillation: Secondary | ICD-10-CM | POA: Diagnosis not present

## 2019-03-21 ENCOUNTER — Ambulatory Visit: Payer: Medicare Other | Admitting: Neurology

## 2019-03-21 DIAGNOSIS — I5032 Chronic diastolic (congestive) heart failure: Secondary | ICD-10-CM | POA: Diagnosis not present

## 2019-03-21 DIAGNOSIS — M1711 Unilateral primary osteoarthritis, right knee: Secondary | ICD-10-CM | POA: Diagnosis not present

## 2019-03-21 DIAGNOSIS — I10 Essential (primary) hypertension: Secondary | ICD-10-CM | POA: Diagnosis not present

## 2019-03-21 DIAGNOSIS — G309 Alzheimer's disease, unspecified: Secondary | ICD-10-CM | POA: Diagnosis not present

## 2019-03-26 DIAGNOSIS — U071 COVID-19: Secondary | ICD-10-CM | POA: Diagnosis not present

## 2019-05-20 DEATH — deceased

## 2019-06-21 ENCOUNTER — Encounter: Payer: Self-pay | Admitting: General Practice
